# Patient Record
Sex: Male | Born: 1955 | Race: Black or African American | Hispanic: No | Marital: Married | Smoking: Current every day smoker
Health system: Southern US, Community
[De-identification: ages and names within clinical notes are randomized; demographics above are authoritative.]

## PROBLEM LIST (undated history)

## (undated) DIAGNOSIS — I10 Essential (primary) hypertension: Secondary | ICD-10-CM

## (undated) DIAGNOSIS — N529 Male erectile dysfunction, unspecified: Secondary | ICD-10-CM

## (undated) DIAGNOSIS — I255 Ischemic cardiomyopathy: Secondary | ICD-10-CM

## (undated) DIAGNOSIS — Z9842 Cataract extraction status, left eye: Secondary | ICD-10-CM

## (undated) DIAGNOSIS — Z720 Tobacco use: Secondary | ICD-10-CM

## (undated) DIAGNOSIS — M51369 Other intervertebral disc degeneration, lumbar region without mention of lumbar back pain or lower extremity pain: Secondary | ICD-10-CM

## (undated) DIAGNOSIS — N401 Enlarged prostate with lower urinary tract symptoms: Secondary | ICD-10-CM

## (undated) DIAGNOSIS — I493 Ventricular premature depolarization: Secondary | ICD-10-CM

## (undated) DIAGNOSIS — M1990 Unspecified osteoarthritis, unspecified site: Secondary | ICD-10-CM

## (undated) DIAGNOSIS — I700 Atherosclerosis of aorta: Secondary | ICD-10-CM

## (undated) DIAGNOSIS — N138 Other obstructive and reflux uropathy: Secondary | ICD-10-CM

## (undated) DIAGNOSIS — Z9841 Cataract extraction status, right eye: Secondary | ICD-10-CM

## (undated) DIAGNOSIS — K5730 Diverticulosis of large intestine without perforation or abscess without bleeding: Secondary | ICD-10-CM

## (undated) DIAGNOSIS — M5136 Other intervertebral disc degeneration, lumbar region: Secondary | ICD-10-CM

## (undated) DIAGNOSIS — K4090 Unilateral inguinal hernia, without obstruction or gangrene, not specified as recurrent: Secondary | ICD-10-CM

## (undated) DIAGNOSIS — E785 Hyperlipidemia, unspecified: Secondary | ICD-10-CM

## (undated) DIAGNOSIS — I5022 Chronic systolic (congestive) heart failure: Secondary | ICD-10-CM

## (undated) DIAGNOSIS — I509 Heart failure, unspecified: Secondary | ICD-10-CM

## (undated) DIAGNOSIS — E119 Type 2 diabetes mellitus without complications: Secondary | ICD-10-CM

## (undated) DIAGNOSIS — N400 Enlarged prostate without lower urinary tract symptoms: Secondary | ICD-10-CM

## (undated) DIAGNOSIS — I2510 Atherosclerotic heart disease of native coronary artery without angina pectoris: Secondary | ICD-10-CM

## (undated) HISTORY — DX: Unilateral inguinal hernia, without obstruction or gangrene, not specified as recurrent: K40.90

## (undated) HISTORY — DX: Chronic systolic (congestive) heart failure: I50.22

## (undated) HISTORY — DX: Ventricular premature depolarization: I49.3

## (undated) HISTORY — DX: Ischemic cardiomyopathy: I25.5

## (undated) HISTORY — DX: Hyperlipidemia, unspecified: E78.5

## (undated) HISTORY — DX: Atherosclerotic heart disease of native coronary artery without angina pectoris: I25.10

## (undated) HISTORY — PX: EYE SURGERY: SHX253

## (undated) HISTORY — PX: CATARACT EXTRACTION: SUR2

## (undated) HISTORY — DX: Diverticulosis of large intestine without perforation or abscess without bleeding: K57.30

## (undated) HISTORY — DX: Tobacco use: Z72.0

---

## 2014-07-27 DIAGNOSIS — H25013 Cortical age-related cataract, bilateral: Secondary | ICD-10-CM | POA: Insufficient documentation

## 2016-04-04 DIAGNOSIS — H2512 Age-related nuclear cataract, left eye: Secondary | ICD-10-CM | POA: Insufficient documentation

## 2016-08-21 DIAGNOSIS — I213 ST elevation (STEMI) myocardial infarction of unspecified site: Secondary | ICD-10-CM

## 2016-08-21 HISTORY — DX: ST elevation (STEMI) myocardial infarction of unspecified site: I21.3

## 2017-07-20 ENCOUNTER — Other Ambulatory Visit: Payer: Self-pay

## 2017-07-20 ENCOUNTER — Emergency Department: Payer: Commercial Managed Care - HMO

## 2017-07-20 ENCOUNTER — Encounter: Payer: Self-pay | Admitting: Emergency Medicine

## 2017-07-20 DIAGNOSIS — E1165 Type 2 diabetes mellitus with hyperglycemia: Secondary | ICD-10-CM | POA: Diagnosis present

## 2017-07-20 DIAGNOSIS — I255 Ischemic cardiomyopathy: Secondary | ICD-10-CM | POA: Diagnosis present

## 2017-07-20 DIAGNOSIS — E785 Hyperlipidemia, unspecified: Secondary | ICD-10-CM | POA: Diagnosis present

## 2017-07-20 DIAGNOSIS — I5021 Acute systolic (congestive) heart failure: Secondary | ICD-10-CM | POA: Diagnosis present

## 2017-07-20 DIAGNOSIS — J9601 Acute respiratory failure with hypoxia: Secondary | ICD-10-CM | POA: Diagnosis present

## 2017-07-20 DIAGNOSIS — Z716 Tobacco abuse counseling: Secondary | ICD-10-CM

## 2017-07-20 DIAGNOSIS — F17210 Nicotine dependence, cigarettes, uncomplicated: Secondary | ICD-10-CM | POA: Diagnosis present

## 2017-07-20 DIAGNOSIS — R079 Chest pain, unspecified: Secondary | ICD-10-CM | POA: Diagnosis not present

## 2017-07-20 DIAGNOSIS — E871 Hypo-osmolality and hyponatremia: Secondary | ICD-10-CM | POA: Diagnosis present

## 2017-07-20 DIAGNOSIS — I2119 ST elevation (STEMI) myocardial infarction involving other coronary artery of inferior wall: Secondary | ICD-10-CM | POA: Diagnosis not present

## 2017-07-20 DIAGNOSIS — Z7984 Long term (current) use of oral hypoglycemic drugs: Secondary | ICD-10-CM

## 2017-07-20 DIAGNOSIS — I2510 Atherosclerotic heart disease of native coronary artery without angina pectoris: Secondary | ICD-10-CM | POA: Diagnosis present

## 2017-07-20 LAB — CBC
HCT: 46.7 % (ref 40.0–52.0)
Hemoglobin: 15.6 g/dL (ref 13.0–18.0)
MCH: 31.2 pg (ref 26.0–34.0)
MCHC: 33.4 g/dL (ref 32.0–36.0)
MCV: 93.2 fL (ref 80.0–100.0)
PLATELETS: 252 10*3/uL (ref 150–440)
RBC: 5.02 MIL/uL (ref 4.40–5.90)
RDW: 12.9 % (ref 11.5–14.5)
WBC: 11.5 10*3/uL — AB (ref 3.8–10.6)

## 2017-07-20 NOTE — ED Triage Notes (Signed)
Pt arrives ambulatory to triage with c/o chest ache that started sometime yesterday. Pt reports that he has had a constant ache since that time. Pt states that he has some N/V yesterday but denies other cardiac symptoms today and at this time. Pt is in NAD.

## 2017-07-20 NOTE — ED Notes (Signed)
Patient transported to X-ray 

## 2017-07-21 ENCOUNTER — Encounter
Admission: EM | Disposition: A | Discharge status: Home / Self Care | Payer: Self-pay | Source: Home / Self Care | Attending: Internal Medicine

## 2017-07-21 ENCOUNTER — Other Ambulatory Visit: Payer: Self-pay

## 2017-07-21 ENCOUNTER — Inpatient Hospital Stay
Admission: EM | Admit: 2017-07-21 | Discharge: 2017-07-23 | DRG: 280 | Disposition: A | Discharge status: Home / Self Care | Payer: Commercial Managed Care - HMO | Attending: Internal Medicine | Admitting: Internal Medicine

## 2017-07-21 DIAGNOSIS — I2111 ST elevation (STEMI) myocardial infarction involving right coronary artery: Secondary | ICD-10-CM | POA: Diagnosis not present

## 2017-07-21 DIAGNOSIS — I213 ST elevation (STEMI) myocardial infarction of unspecified site: Secondary | ICD-10-CM | POA: Diagnosis not present

## 2017-07-21 DIAGNOSIS — E785 Hyperlipidemia, unspecified: Secondary | ICD-10-CM | POA: Diagnosis present

## 2017-07-21 DIAGNOSIS — I5021 Acute systolic (congestive) heart failure: Secondary | ICD-10-CM | POA: Diagnosis not present

## 2017-07-21 DIAGNOSIS — I2119 ST elevation (STEMI) myocardial infarction involving other coronary artery of inferior wall: Secondary | ICD-10-CM | POA: Diagnosis present

## 2017-07-21 DIAGNOSIS — F17210 Nicotine dependence, cigarettes, uncomplicated: Secondary | ICD-10-CM | POA: Diagnosis present

## 2017-07-21 DIAGNOSIS — Z7984 Long term (current) use of oral hypoglycemic drugs: Secondary | ICD-10-CM | POA: Diagnosis not present

## 2017-07-21 DIAGNOSIS — I5022 Chronic systolic (congestive) heart failure: Secondary | ICD-10-CM | POA: Diagnosis not present

## 2017-07-21 DIAGNOSIS — I2510 Atherosclerotic heart disease of native coronary artery without angina pectoris: Secondary | ICD-10-CM

## 2017-07-21 DIAGNOSIS — E1165 Type 2 diabetes mellitus with hyperglycemia: Secondary | ICD-10-CM | POA: Diagnosis present

## 2017-07-21 DIAGNOSIS — R079 Chest pain, unspecified: Secondary | ICD-10-CM | POA: Diagnosis not present

## 2017-07-21 DIAGNOSIS — E871 Hypo-osmolality and hyponatremia: Secondary | ICD-10-CM | POA: Diagnosis present

## 2017-07-21 DIAGNOSIS — I219 Acute myocardial infarction, unspecified: Secondary | ICD-10-CM | POA: Insufficient documentation

## 2017-07-21 DIAGNOSIS — J9601 Acute respiratory failure with hypoxia: Secondary | ICD-10-CM | POA: Diagnosis not present

## 2017-07-21 DIAGNOSIS — I340 Nonrheumatic mitral (valve) insufficiency: Secondary | ICD-10-CM | POA: Diagnosis not present

## 2017-07-21 DIAGNOSIS — E119 Type 2 diabetes mellitus without complications: Secondary | ICD-10-CM | POA: Diagnosis not present

## 2017-07-21 DIAGNOSIS — Z716 Tobacco abuse counseling: Secondary | ICD-10-CM | POA: Diagnosis not present

## 2017-07-21 DIAGNOSIS — I255 Ischemic cardiomyopathy: Secondary | ICD-10-CM | POA: Diagnosis present

## 2017-07-21 DIAGNOSIS — IMO0002 Reserved for concepts with insufficient information to code with codable children: Secondary | ICD-10-CM

## 2017-07-21 HISTORY — DX: Type 2 diabetes mellitus without complications: E11.9

## 2017-07-21 HISTORY — DX: ST elevation (STEMI) myocardial infarction involving other coronary artery of inferior wall: I21.19

## 2017-07-21 HISTORY — DX: Atherosclerotic heart disease of native coronary artery without angina pectoris: I25.10

## 2017-07-21 HISTORY — PX: LEFT HEART CATH AND CORONARY ANGIOGRAPHY: CATH118249

## 2017-07-21 HISTORY — DX: Chronic systolic (congestive) heart failure: I50.22

## 2017-07-21 LAB — GLUCOSE, CAPILLARY
GLUCOSE-CAPILLARY: 329 mg/dL — AB (ref 65–99)
GLUCOSE-CAPILLARY: 262 mg/dL — AB (ref 65–99)
Glucose-Capillary: 251 mg/dL — ABNORMAL HIGH (ref 65–99)
Glucose-Capillary: 274 mg/dL — ABNORMAL HIGH (ref 65–99)

## 2017-07-21 LAB — CBC
HCT: 44.2 % (ref 40.0–52.0)
Hemoglobin: 15.3 g/dL (ref 13.0–18.0)
MCH: 31.8 pg (ref 26.0–34.0)
MCHC: 34.7 g/dL (ref 32.0–36.0)
MCV: 91.6 fL (ref 80.0–100.0)
Platelets: 229 10*3/uL (ref 150–440)
RBC: 4.82 MIL/uL (ref 4.40–5.90)
RDW: 12.7 % (ref 11.5–14.5)
WBC: 10.4 10*3/uL (ref 3.8–10.6)

## 2017-07-21 LAB — URINE DRUG SCREEN, QUALITATIVE (ARMC ONLY)
AMPHETAMINES, UR SCREEN: NOT DETECTED
Barbiturates, Ur Screen: NOT DETECTED
Benzodiazepine, Ur Scrn: NOT DETECTED
CANNABINOID 50 NG, UR ~~LOC~~: NOT DETECTED
COCAINE METABOLITE, UR ~~LOC~~: NOT DETECTED
MDMA (ECSTASY) UR SCREEN: NOT DETECTED
METHADONE SCREEN, URINE: NOT DETECTED
Opiate, Ur Screen: NOT DETECTED
Phencyclidine (PCP) Ur S: NOT DETECTED
TRICYCLIC, UR SCREEN: NOT DETECTED

## 2017-07-21 LAB — BASIC METABOLIC PANEL
ANION GAP: 10 (ref 5–15)
Anion gap: 10 (ref 5–15)
BUN: 14 mg/dL (ref 6–20)
BUN: 14 mg/dL (ref 6–20)
CALCIUM: 8.8 mg/dL — AB (ref 8.9–10.3)
CALCIUM: 8.9 mg/dL (ref 8.9–10.3)
CHLORIDE: 100 mmol/L — AB (ref 101–111)
CO2: 23 mmol/L (ref 22–32)
CO2: 25 mmol/L (ref 22–32)
CREATININE: 0.84 mg/dL (ref 0.61–1.24)
CREATININE: 0.99 mg/dL (ref 0.61–1.24)
Chloride: 96 mmol/L — ABNORMAL LOW (ref 101–111)
GFR calc non Af Amer: >60 mL/min (ref >60)
Glucose, Bld: 285 mg/dL — ABNORMAL HIGH (ref 65–99)
Glucose, Bld: 401 mg/dL — ABNORMAL HIGH (ref 65–99)
Potassium: 3.7 mmol/L (ref 3.5–5.1)
Potassium: 3.9 mmol/L (ref 3.5–5.1)
SODIUM: 131 mmol/L — AB (ref 135–145)
Sodium: 133 mmol/L — ABNORMAL LOW (ref 135–145)

## 2017-07-21 LAB — TROPONIN I
TROPONIN I: 3.16 ng/mL — AB (ref <0.03)
TROPONIN I: 4.98 ng/mL — AB (ref <0.03)
Troponin I: 4.05 ng/mL (ref <0.03)
Troponin I: 3.94 ng/mL (ref <0.03)

## 2017-07-21 LAB — MRSA PCR SCREENING: MRSA by PCR: NEGATIVE

## 2017-07-21 LAB — PROTIME-INR
INR: 1.08
PROTHROMBIN TIME: 13.9 s (ref 11.4–15.2)

## 2017-07-21 LAB — PHOSPHORUS: Phosphorus: 2.5 mg/dL (ref 2.5–4.6)

## 2017-07-21 LAB — POCT ACTIVATED CLOTTING TIME: ACTIVATED CLOTTING TIME: 340 s

## 2017-07-21 LAB — HEMOGLOBIN A1C
Hgb A1c MFr Bld: 11.4 % — ABNORMAL HIGH (ref 4.8–5.6)
MEAN PLASMA GLUCOSE: 280.48 mg/dL

## 2017-07-21 LAB — MAGNESIUM: MAGNESIUM: 2 mg/dL (ref 1.7–2.4)

## 2017-07-21 LAB — HEPARIN LEVEL (UNFRACTIONATED)
HEPARIN UNFRACTIONATED: 0.21 [IU]/mL — AB (ref 0.30–0.70)
Heparin Unfractionated: 0.39 IU/mL (ref 0.30–0.70)

## 2017-07-21 LAB — APTT: aPTT: 129 seconds — ABNORMAL HIGH (ref 24–36)

## 2017-07-21 SURGERY — LEFT HEART CATH AND CORONARY ANGIOGRAPHY
Anesthesia: Moderate Sedation

## 2017-07-21 MED ORDER — SODIUM CHLORIDE 0.9% FLUSH
3.0000 mL | Freq: Two times a day (BID) | INTRAVENOUS | Status: DC
  Administered 2017-07-21 – 2017-07-23 (×4): 3 mL via INTRAVENOUS

## 2017-07-21 MED ORDER — MORPHINE SULFATE (PF) 2 MG/ML IV SOLN
2.0000 mg | INTRAVENOUS | Status: DC | PRN
  Administered 2017-07-21 – 2017-07-23 (×4): 2 mg via INTRAVENOUS
  Filled 2017-07-21 (×4): qty 1

## 2017-07-21 MED ORDER — HEPARIN (PORCINE) IN NACL 2-0.9 UNIT/ML-% IJ SOLN
INTRAMUSCULAR | Status: AC
  Filled 2017-07-21: qty 500

## 2017-07-21 MED ORDER — SODIUM CHLORIDE 0.9% FLUSH: 3.0000 mL | INTRAVENOUS | Status: DC | PRN

## 2017-07-21 MED ORDER — ONDANSETRON HCL 4 MG/2ML IJ SOLN: 4.0000 mg | Freq: Four times a day (QID) | INTRAMUSCULAR | Status: DC | PRN

## 2017-07-21 MED ORDER — INSULIN ASPART 100 UNIT/ML ~~LOC~~ SOLN
0.0000 [IU] | Freq: Three times a day (TID) | SUBCUTANEOUS | Status: DC
  Administered 2017-07-21: 11 [IU] via SUBCUTANEOUS
  Administered 2017-07-22 (×2): 4 [IU] via SUBCUTANEOUS
  Administered 2017-07-22: 11 [IU] via SUBCUTANEOUS
  Administered 2017-07-23 (×2): 4 [IU] via SUBCUTANEOUS
  Filled 2017-07-21 (×6): qty 1

## 2017-07-21 MED ORDER — ASPIRIN 81 MG PO CHEW
81.0000 mg | CHEWABLE_TABLET | Freq: Every day | ORAL | Status: DC
  Administered 2017-07-21 – 2017-07-23 (×3): 81 mg via ORAL
  Filled 2017-07-21 (×3): qty 1

## 2017-07-21 MED ORDER — SODIUM CHLORIDE 0.9 % IV SOLN: 250.0000 mL | INTRAVENOUS | Status: DC | PRN

## 2017-07-21 MED ORDER — HEPARIN BOLUS VIA INFUSION
1400.0000 [IU] | Freq: Once | INTRAVENOUS | Status: AC
  Administered 2017-07-21: 1400 [IU] via INTRAVENOUS
  Filled 2017-07-21: qty 1400

## 2017-07-21 MED ORDER — ENOXAPARIN SODIUM 40 MG/0.4ML ~~LOC~~ SOLN: 40.0000 mg | SUBCUTANEOUS | Status: DC

## 2017-07-21 MED ORDER — VERAPAMIL HCL 2.5 MG/ML IV SOLN
INTRAVENOUS | Status: AC
Start: 2017-07-21 — End: ?
  Filled 2017-07-21: qty 2

## 2017-07-21 MED ORDER — ATORVASTATIN CALCIUM 20 MG PO TABS
80.0000 mg | ORAL_TABLET | Freq: Every day | ORAL | Status: DC
  Administered 2017-07-21 – 2017-07-23 (×3): 80 mg via ORAL
  Filled 2017-07-21 (×3): qty 4

## 2017-07-21 MED ORDER — ASPIRIN 81 MG PO CHEW
324.0000 mg | CHEWABLE_TABLET | Freq: Once | ORAL | Status: AC
  Administered 2017-07-21: 324 mg via ORAL

## 2017-07-21 MED ORDER — METOPROLOL TARTRATE 5 MG/5ML IV SOLN
3.0000 mg | Freq: Once | INTRAVENOUS | Status: AC
  Administered 2017-07-21: 3 mg via INTRAVENOUS
  Filled 2017-07-21: qty 5

## 2017-07-21 MED ORDER — HEPARIN SODIUM (PORCINE) 1000 UNIT/ML IJ SOLN
INTRAMUSCULAR | Status: AC
  Filled 2017-07-21: qty 1

## 2017-07-21 MED ORDER — HEPARIN (PORCINE) IN NACL 100-0.45 UNIT/ML-% IJ SOLN
1350.0000 [IU]/h | INTRAMUSCULAR | Status: DC
  Administered 2017-07-21: 1350 [IU]/h via INTRAVENOUS
  Filled 2017-07-21: qty 250

## 2017-07-21 MED ORDER — INSULIN ASPART 100 UNIT/ML ~~LOC~~ SOLN: 0.0000 [IU] | Freq: Every day | SUBCUTANEOUS | Status: DC

## 2017-07-21 MED ORDER — NITROGLYCERIN 0.4 MG SL SUBL
0.4000 mg | SUBLINGUAL_TABLET | SUBLINGUAL | Status: DC | PRN
  Administered 2017-07-21 (×3): 0.4 mg via SUBLINGUAL
  Filled 2017-07-21 (×3): qty 1

## 2017-07-21 MED ORDER — NITROGLYCERIN IN D5W 200-5 MCG/ML-% IV SOLN
10.0000 ug/min | INTRAVENOUS | Status: DC
  Administered 2017-07-21: 10 ug/min via INTRAVENOUS
  Administered 2017-07-21: 5 ug/min via INTRAVENOUS
  Filled 2017-07-21: qty 250

## 2017-07-21 MED ORDER — INSULIN ASPART 100 UNIT/ML ~~LOC~~ SOLN
0.0000 [IU] | Freq: Three times a day (TID) | SUBCUTANEOUS | Status: DC
  Administered 2017-07-21: 5 [IU] via SUBCUTANEOUS
  Administered 2017-07-21: 11 [IU] via SUBCUTANEOUS
  Filled 2017-07-21 (×2): qty 1

## 2017-07-21 MED ORDER — CARVEDILOL 3.125 MG PO TABS
3.1250 mg | ORAL_TABLET | Freq: Two times a day (BID) | ORAL | Status: DC
  Administered 2017-07-21 – 2017-07-23 (×4): 3.125 mg via ORAL
  Filled 2017-07-21 (×6): qty 1

## 2017-07-21 MED ORDER — ACETAMINOPHEN 325 MG PO TABS: 650.0000 mg | ORAL_TABLET | ORAL | Status: DC | PRN

## 2017-07-21 MED ORDER — LIDOCAINE HCL (PF) 1 % IJ SOLN
INTRAMUSCULAR | Status: DC | PRN
  Administered 2017-07-21: 4 mL via SUBCUTANEOUS

## 2017-07-21 MED ORDER — HEPARIN SODIUM (PORCINE) 5000 UNIT/ML IJ SOLN
4000.0000 [IU] | Freq: Once | INTRAMUSCULAR | Status: AC
  Administered 2017-07-21: 4000 [IU] via INTRAVENOUS

## 2017-07-21 MED ORDER — LOSARTAN POTASSIUM 50 MG PO TABS
25.0000 mg | ORAL_TABLET | Freq: Every day | ORAL | Status: DC
  Administered 2017-07-21: 25 mg via ORAL
  Filled 2017-07-21: qty 1

## 2017-07-21 MED ORDER — POLYETHYLENE GLYCOL 3350 17 G PO PACK
17.0000 g | PACK | Freq: Every day | ORAL | Status: DC
  Administered 2017-07-21 – 2017-07-23 (×3): 17 g via ORAL
  Filled 2017-07-21 (×3): qty 1

## 2017-07-21 MED ORDER — HEPARIN (PORCINE) IN NACL 100-0.45 UNIT/ML-% IJ SOLN
1550.0000 [IU]/h | INTRAMUSCULAR | Status: DC
  Administered 2017-07-21: 1350 [IU]/h via INTRAVENOUS
  Administered 2017-07-21 – 2017-07-23 (×3): 1550 [IU]/h via INTRAVENOUS
  Filled 2017-07-21 (×4): qty 250

## 2017-07-21 MED ORDER — IOPAMIDOL (ISOVUE-300) INJECTION 61%
INTRAVENOUS | Status: DC | PRN
  Administered 2017-07-21: 140 mL

## 2017-07-21 MED ORDER — HEPARIN SODIUM (PORCINE) 1000 UNIT/ML IJ SOLN
INTRAMUSCULAR | Status: DC | PRN
  Administered 2017-07-21: 6000 [IU] via INTRAVENOUS

## 2017-07-21 MED ORDER — CLOPIDOGREL BISULFATE 75 MG PO TABS
75.0000 mg | ORAL_TABLET | Freq: Every day | ORAL | Status: DC
  Administered 2017-07-21 – 2017-07-23 (×3): 75 mg via ORAL
  Filled 2017-07-21 (×3): qty 1

## 2017-07-21 MED ORDER — SODIUM CHLORIDE 0.9 % IV SOLN
INTRAVENOUS | Status: AC
  Administered 2017-07-21: 03:00:00 via INTRAVENOUS

## 2017-07-21 MED ORDER — LIDOCAINE HCL (PF) 1 % IJ SOLN
INTRAMUSCULAR | Status: AC
  Filled 2017-07-21: qty 30

## 2017-07-21 MED ORDER — VERAPAMIL HCL 2.5 MG/ML IV SOLN
INTRAVENOUS | Status: DC | PRN
  Administered 2017-07-21: 3 mg via INTRA_ARTERIAL

## 2017-07-21 MED ORDER — HEPARIN BOLUS VIA INFUSION
4000.0000 [IU] | Freq: Once | INTRAVENOUS | Status: DC
  Filled 2017-07-21: qty 4000

## 2017-07-21 MED ORDER — HEPARIN (PORCINE) IN NACL 2-0.9 UNIT/ML-% IJ SOLN
INTRAMUSCULAR | Status: DC | PRN
  Administered 2017-07-21: 02:00:00

## 2017-07-21 MED ORDER — INSULIN ASPART 100 UNIT/ML ~~LOC~~ SOLN
0.0000 [IU] | Freq: Every day | SUBCUTANEOUS | Status: DC
  Administered 2017-07-21: 3 [IU] via SUBCUTANEOUS
  Filled 2017-07-21 (×2): qty 1

## 2017-07-21 SURGICAL SUPPLY — 12 items
CATH INFINITI 5FR ANG PIGTAIL (CATHETERS) ×2 IMPLANT
CATH LAUNCHER 6FR JR4 (CATHETERS) ×2 IMPLANT
CATH OPTITORQUE JACKY 4.0 5F (CATHETERS) ×2 IMPLANT
DEVICE INFLAT 30 PLUS (MISCELLANEOUS) ×2 IMPLANT
DEVICE RAD COMP TR BAND LRG (VASCULAR PRODUCTS) ×2 IMPLANT
DEVICE RAD TR BAND REGULAR (VASCULAR PRODUCTS) ×2 IMPLANT
GLIDESHEATH SLEND SS 6F .021 (SHEATH) ×2 IMPLANT
KIT MANI 3VAL PERCEP (MISCELLANEOUS) ×2 IMPLANT
PACK CARDIAC CATH (CUSTOM PROCEDURE TRAY) ×2 IMPLANT
WIRE INTUITION PROPEL ST 180CM (WIRE) ×2 IMPLANT
WIRE ROSEN-J .035X260CM (WIRE) ×2 IMPLANT
WIRE RUNTHROUGH .014X180CM (WIRE) ×2 IMPLANT

## 2017-07-21 NOTE — ED Notes (Signed)
Date and time results received: 07/21/17 0010 (use smartphrase ".now" to insert current time)  Test: troponin Critical Value: 4.98  Name of Provider Notified: Dr. Owens Shark  Orders Received? Or Actions Taken?: Acknowledged

## 2017-07-21 NOTE — Consult Note (Signed)
PULMONARY / CRITICAL CARE MEDICINE   Name: Steele D [place] MRN: [id number] DOB: 08-07-1956    ADMISSION DATE:  07/21/2017   CONSULTATION DATE:  07/21/2017  REFERRING MD:  Dr Fletcher Anon  Reason: ICU management acute ST elevation MI status post left heart catheterization   HISTORY OF PRESENT ILLNESS:   61 year old male with a history of uncontrolled type 2 diabetes and current tobacco use presenting with chest pain that started the day before.  Upon arrival in the ED patient complained of nausea and vomiting.  His EKG showed inferior Q waves with if equivocal ST elevation inferiorly and a troponin level of 5.  He was taken emergently to to the Cath Lab for left heart catheterization.  He was found to have severe stenosis in the proximal RCA and distal circumflex not amenable to any interventions.  Medical management was recommended and patient was transferred to the ICU for further observation and management. He is still complaining of chest pain, left substernal, steady, pressure-like in nature, 5/10 in intensity; minimal relief with nitroglycerin; moderate relief with morphine.  Denies nausea vomiting dizziness and headache PAST MEDICAL HISTORY :  He  has a past medical history of Diabetes mellitus without complication ([place]).  PAST SURGICAL HISTORY: He  has a past surgical history that includes Eye surgery.  No Known Allergies  No current facility-administered medications on file prior to encounter.    No current outpatient medications on file prior to encounter.    FAMILY HISTORY:  His has no family status information on file.    SOCIAL HISTORY: He  reports that he has been smoking.  He has been smoking about 1.00 pack per day. he has never used smokeless tobacco. He reports that he drinks alcohol. He reports that he does not use drugs.  REVIEW OF SYSTEMS:   All systems reviewed.  Pertinent positives include chest pain, pain with deep breaths and mild shortness of breath.  All other  systems are negative  SUBJECTIVE:   VITAL SIGNS: BP 105/68   Pulse 90   Temp 97.9 F (36.6 C) (Oral)   Resp (!) 28   Ht 6' (1.829 m)   Wt 99.8 kg (220 lb)   SpO2 91%   BMI 29.84 kg/m   HEMODYNAMICS:    VENTILATOR SETTINGS:    INTAKE / OUTPUT: No intake/output data recorded.  PHYSICAL EXAMINATION: General: Mild distress Neuro:  AAO X3, CN intact HEENT:  PERRLA, trachea midline, neck is supple, no JVD Cardiovascular: RRR, S1/S2, no MRG, +2 PULSES, no edema Lungs:  CTAB Abdomen:  Soft, NT/ND, + BS X4 Musculoskeletal:  No deformities Skin:  Warm and dry  LABS:  BMET Recent Labs  Lab 07/20/17 2341 07/21/17 0339  NA 131* 133*  K 3.9 3.7  CL 96* 100*  CO2 25 23  BUN 14 14  CREATININE 0.99 0.84  GLUCOSE 401* 285*    Electrolytes Recent Labs  Lab 07/20/17 2341 07/21/17 0339  CALCIUM 8.8* 8.9  MG  --  2.0  PHOS  --  2.5    CBC Recent Labs  Lab 07/20/17 2341 07/21/17 0339  WBC 11.5* 10.4  HGB 15.6 15.3  HCT 46.7 44.2  PLT 252 229    Coag's Recent Labs  Lab 07/21/17 0033  APTT 129*  INR 1.08    Sepsis Markers No results for input(s): LATICACIDVEN, PROCALCITON, O2SATVEN in the last 168 hours.  ABG No results for input(s): PHART, PCO2ART, PO2ART in the last 168 hours.  Liver Enzymes No  results for input(s): AST, ALT, ALKPHOS, BILITOT, ALBUMIN in the last 168 hours.  Cardiac Enzymes Recent Labs  Lab 07/20/17 2341 07/21/17 0339  TROPONINI 4.98* 4.05*    Glucose No results for input(s): GLUCAP in the last 168 hours.  Imaging Dg Chest 2 View  Result Date: 07/21/2017 CLINICAL DATA:  Chest pain and nausea and vomiting beginning yesterday. EXAM: CHEST  2 VIEW COMPARISON:  None. FINDINGS: The heart size and mediastinal contours are within normal limits. Both lungs are clear. The visualized skeletal structures are unremarkable. IMPRESSION: Negative.  No active cardiopulmonary disease. Electronically Signed   By: Earle Gell M.D.   On:  07/21/2017 00:04     STUDIES:  LEFT HEART CATH AND CORONARY ANGIOGRAPHY  Conclusion     Prox Cx lesion is 40% stenosed.  Mid Cx lesion is 40% stenosed.  Dist Cx lesion is 100% stenosed.  Prox RCA lesion is 100% stenosed.  There is severe left ventricular systolic dysfunction.  LV end diastolic pressure is mildly elevated.  The left ventricular ejection fraction is 25-35% by visual estimate.  There is trivial (1+) mitral regurgitation.   1.  Severe two-vessel coronary artery disease with total occlusion of proximal right coronary artery with well-developed left-to-right collaterals and occluded distal left circumflex also with collaterals.  No significant obstructive disease affecting the LAD. 2.  Severely reduced LV systolic function with an EF of 25-30% with inferior wall akinesis as well as hypokinesis of the basal anterior wall.  Mildly elevated left ventricular end-diastolic pressure with an LVEDP of 14 mmHg.   3.  Attempted unsuccessful angioplasty of the right coronary artery due to inability to cross with a wire.  Recommendations: This is a late presenting inferior ST elevation myocardial infarction of at least 24 hours duration.  The EKG was with large inferior Q waves.  The left circumflex disease appears to be chronic.  The RCA occlusion appeared to be acute on chronic and thus I decided to probe with a wire to determine the ability to cross.  I was not able to cross with 2 wires.  There are already reasonable collaterals from the left coronary system.  Recommend medical therapy for coronary artery disease. The patient does have cardiomyopathy which seems to be mixed ischemic and nonischemic and out of proportion to his coronary artery disease. I initiated treatment with carvedilol and losartan.  Consider spironolactone before hospital discharge. The patient needs treatment for diabetes as he has not been on medications.  Delays in door to cath time due to patient's  late presentation and equivocal EKG changes on presentation.     SIGNIFICANT EVENTS: 07/20/17: Admitted  LINES/TUBES: Peripheral IVs  DISCUSSION: 61 year old male with a history of type 2 diabetes presenting with an acute on chronic inferior wall ST elevation MI.  She is now status post left heart catheterization with no stent placement.  Medical management recommended  ASSESSMENT  Late presenting inferior wall ST elevation MI-Severe two-vessel coronary artery disease with total occlusion of proximal right coronary artery with well-developed left-to-right collaterals and occluded distal left circumflex also with collaterals  Severe LV dysfunction-EF 25-30%  Mixed ischemic and non-ischemic cardiomyopathy  Uncontrolled T2DM  Tobacco use disorder  PLAN Hemodynamic monitoring per ICU protocol Continue carvedilol, aspirin, Lipitor, Plavix, heparin, as needed nitroglycerin, as needed morphine and losartan per cardiology Blood glucose monitoring with sliding scale insulin coverage Hemoglobin A1c with a.m. labs Check urine toxicology Smoking cessation advice given  FAMILY  - Updates:  Rutledge Selsor S. [facility] ANP-BC Pulmonary and Critical Care Medicine [facility] Pager [phone] or [phone]  07/21/2017, 5:40 AM

## 2017-07-21 NOTE — Progress Notes (Signed)
[place] for heparin drip Indication: ACS/STEMI  No Known Allergies  Patient Measurements: Height: 6' (182.9 cm) Weight: 220 lb (99.8 kg) IBW/kg (Calculated) : 77.6 Heparin Dosing Weight: 98 kg   Vital Signs: Temp: 99.3 F (37.4 C) (12/01 0800) Temp Source: Oral (12/01 0800) BP: 99/63 (12/01 1200) Pulse Rate: 98 (12/01 1200)  Labs: Recent Labs    07/20/17 2341 07/21/17 0033 07/21/17 0339 07/21/17 1132  HGB 15.6  --  15.3  --   HCT 46.7  --  44.2  --   PLT 252  --  229  --   APTT  --  129*  --   --   LABPROT  --  13.9  --   --   INR  --  1.08  --   --   HEPARINUNFRC  --   --   --  0.21*  CREATININE 0.99  --  0.84  --   TROPONINI 4.98*  --  4.05* 3.16*    Estimated Creatinine Clearance: 111.6 mL/min (by C-G formula based on SCr of 0.84 mg/dL).   Medical History: Past Medical History:  Diagnosis Date  . Diabetes mellitus without complication (HCC)     Medications:  No anticoag in PTA meds.  Assessment: 61 yo male with late presenting inferior MI.   Goal of Therapy:  Heparin level 0.3-0.7 units/ml Monitor platelets by anticoagulation protocol: Yes   Plan:  4000 unit bolus and initial rate of 1350 units/hr. First heparin level 6 hours after start of infusion.   12/01 0330 heparin was d/c by cardiology but restarted by CCM. Will restart at previous rate with no bolus. First level 6 hours after restart.  12/1 1132 HL 0.21 subtherapeutic. RN confirms heparin drip running at 13.5 ml/hr; no line issues or s/sx of bleeding noted by RN. Will order heparin bolus 1400 units IV x1 then increase drip to 1550 units/hr (=15.65ml/hr). Recheck HL in 6h. CBC in AM. Hgb and plt count about stable.     Rayna Sexton L 07/21/2017,12:40 PM

## 2017-07-21 NOTE — Progress Notes (Signed)
NP in to speak with patient.

## 2017-07-21 NOTE — H&P (Signed)
[facility] [place] NAME: Lawrence Holmes    MR#:  [id number]  DATE OF BIRTH:  11/07/1955  DATE OF ADMISSION:  07/21/2017  PRIMARY CARE PHYSICIAN: Patient, No Pcp Per   REQUESTING/REFERRING PHYSICIAN: Fletcher Anon, MD  CHIEF COMPLAINT:   Chief Complaint  Patient presents with  . Chest Pain    HISTORY OF PRESENT ILLNESS:  Lawrence Holmes  is a 61 y.o. male who presents with STEMI.  Patient came in after having shoulder pain for a couple of days, which then became more chest pain on the day he arrived to the ED.  He was found to have a STEMI equivalent with inferior Q waves and was taken for catheterization.  He was found to have occlusion of the right coronary which was not amenable to angioplasty or stenting.  He has significant heart failure with an EF of around 20%.  Patient states that he has had several episodes of the shoulder pain before, and it is very likely that he had some prior unrecognized insults leading to his severe heart failure.  He was admitted with goal of maximum medical management and hospitalist were called for admission.  PAST MEDICAL HISTORY:   Past Medical History:  Diagnosis Date  . Diabetes mellitus without complication ([place])     PAST SURGICAL HISTORY:   Past Surgical History:  Procedure Laterality Date  . EYE SURGERY      SOCIAL HISTORY:   Social History   Tobacco Use  . Smoking status: Current Every Day Smoker    Packs/day: 1.00  . Smokeless tobacco: Never Used  Substance Use Topics  . Alcohol use: Yes    FAMILY HISTORY:  No family history on file.  DRUG ALLERGIES:  No Known Allergies  MEDICATIONS AT HOME:   Prior to Admission medications   Not on File    REVIEW OF SYSTEMS:  Review of Systems  Constitutional: Negative for chills, fever, malaise/fatigue and weight loss.  HENT: Negative for ear pain, hearing loss and tinnitus.   Eyes: Negative for blurred vision, double vision, pain and  redness.  Respiratory: Negative for cough, hemoptysis and shortness of breath.   Cardiovascular: Positive for chest pain. Negative for palpitations, orthopnea and leg swelling.  Gastrointestinal: Negative for abdominal pain, constipation, diarrhea, nausea and vomiting.  Genitourinary: Negative for dysuria, frequency and hematuria.  Musculoskeletal: Negative for back pain, joint pain and neck pain.  Skin:       No acne, rash, or lesions  Neurological: Negative for dizziness, tremors, focal weakness and weakness.  Endo/Heme/Allergies: Negative for polydipsia. Does not bruise/bleed easily.  Psychiatric/Behavioral: Negative for depression. The patient is not nervous/anxious and does not have insomnia.      VITAL SIGNS:   Vitals:   07/21/17 1700 07/21/17 1800 07/21/17 1913 07/21/17 1930  BP: 115/69 95/61 92/64  97/64  Pulse: (!) 101 95 86 85  Resp: (!) 22 20 18 17   Temp:   99 F (37.2 C)   TempSrc:   Oral   SpO2: 92% 92% 95% 95%  Weight:      Height:       Wt Readings from Last 3 Encounters:  07/20/17 99.8 kg (220 lb)    PHYSICAL EXAMINATION:  Physical Exam  Vitals reviewed. Constitutional: He is oriented to person, place, and time. He appears well-developed and well-nourished. No distress.  HENT:  Head: Normocephalic and atraumatic.  Mouth/Throat: Oropharynx is clear and moist.  Eyes: Conjunctivae and EOM are normal.  Pupils are equal, round, and reactive to light. No scleral icterus.  Neck: Normal range of motion. Neck supple. No JVD present. No thyromegaly present.  Cardiovascular: Normal rate, regular rhythm and intact distal pulses. Exam reveals no gallop and no friction rub.  No murmur heard. Respiratory: Effort normal and breath sounds normal. No respiratory distress. He has no wheezes. He has no rales.  GI: Soft. Bowel sounds are normal. He exhibits no distension. There is no tenderness.  Musculoskeletal: Normal range of motion. He exhibits no edema.  No arthritis, no  gout  Lymphadenopathy:    He has no cervical adenopathy.  Neurological: He is alert and oriented to person, place, and time. No cranial nerve deficit.  No dysarthria, no aphasia  Skin: Skin is warm and dry. No rash noted. No erythema.  Psychiatric: He has a normal mood and affect. His behavior is normal. Judgment and thought content normal.    LABORATORY PANEL:   CBC Recent Labs  Lab 07/21/17 0339  WBC 10.4  HGB 15.3  HCT 44.2  PLT 229   ------------------------------------------------------------------------------------------------------------------  Chemistries  Recent Labs  Lab 07/21/17 0339  NA 133*  K 3.7  CL 100*  CO2 23  GLUCOSE 285*  BUN 14  CREATININE 0.84  CALCIUM 8.9  MG 2.0   ------------------------------------------------------------------------------------------------------------------  Cardiac Enzymes Recent Labs  Lab 07/21/17 1538  TROPONINI 3.94*   ------------------------------------------------------------------------------------------------------------------  RADIOLOGY:  Dg Chest 2 View  Result Date: 07/21/2017 CLINICAL DATA:  Chest pain and nausea and vomiting beginning yesterday. EXAM: CHEST  2 VIEW COMPARISON:  None. FINDINGS: The heart size and mediastinal contours are within normal limits. Both lungs are clear. The visualized skeletal structures are unremarkable. IMPRESSION: Negative.  No active cardiopulmonary disease. Electronically Signed   By: Earle Gell M.D.   On: 07/21/2017 00:04    EKG:   Orders placed or performed during the hospital encounter of 07/21/17  . EKG 12-Lead  . EKG 12-Lead  . ED EKG within 10 minutes  . ED EKG within 10 minutes  . EKG 12-Lead  . EKG 12-Lead  . ED EKG  . ED EKG  . ED EKG  . ED EKG  . EKG 12-Lead  . EKG 12-Lead  . EKG 12-Lead  . EKG 12-Lead    IMPRESSION AND PLAN:  Principal Problem:   Acute ST elevation myocardial infarction (STEMI) of inferior wall (HCC) -taken for catheterization,  RCA lesion which was not amenable to stenting.  Maximum medical management.  Cardiology following. Active Problems:   Acute systolic CHF (congestive heart failure) ([place]) -likely due to his most recent STEMI, although he is probably had other unrecognized previous insults.  Maximum medical management as above.  Cardiology following.   Diabetes ([place]) -sliding scale insulin with corresponding glucose checks.  All the records are reviewed and case discussed with ED provider. Management plans discussed with the patient and/or family.  DVT PROPHYLAXIS: Systemic anticoagulation  GI PROPHYLAXIS: None  ADMISSION STATUS: Inpatient  CODE STATUS: Full    Code Status Orders  (From admission, onward)        Start     Ordered   07/21/17 0218  Full code  Continuous     07/21/17 0217    Code Status History    Date Active Date Inactive Code Status Order ID Comments User Context   This patient has a current code status but no historical code status.      TOTAL TIME TAKING CARE OF THIS PATIENT: 45 minutes.  Milbern Doescher [place] 07/21/2017, 8:58 PM  [facility]  Office  [phone]  CC: Primary care physician; Patient, No Pcp Per  Note:  This document was prepared using Dragon voice recognition software and may include unintentional dictation errors.

## 2017-07-21 NOTE — Progress Notes (Signed)
Lawrence Holmes responded to a PG for Code Stroke in ED-05. EDP stated that the Pt will go to the Cath Lab for a procedure. Pt is in good spirits and understands his status. Pt has no family bedside and did not desire for family to be notified at this time. [place] informed Pt that he is available for follow up should he want or need spiritual care. Pt is appreciative.    07/21/17 0000  Clinical Encounter Type  Visited With Patient;Health care provider  Visit Type Initial;Spiritual support;Code;ED (Code Stroke)  Referral From Nurse  Consult/Referral To Chaplain

## 2017-07-21 NOTE — Progress Notes (Signed)
RN notified Cardiology, Dr Aundra Dubin that patient is still complaining of chest pain. Patient states that pain is tolerable at 4 after IV morphine.   MD ordered for nitro gtt to start at 10, can titrate up to 50 as tolerated, keep systolic BP 996 or better.  Dr Humphrey Rolls aware.

## 2017-07-21 NOTE — Consult Note (Signed)
Cardiology Consultation:   Patient ID: Lawrence Holmes; [id number]; October 23, 1955   Admit date: 07/21/2017 Date of Consult: 07/21/2017  Primary Care Provider: No primary care provider on file. Primary Cardiologist: new Fletcher Anon) Primary Electrophysiologist:  n/a   Patient Profile:   Lawrence Holmes is a 61 y.o. male with a hx of diabetes mellitus not on medications and tobacco use who is being seen today for the evaluation of chest pain and abnormal EKG at the request of Dr. Owens Shark.  History of Present Illness:   [name] is a 60 year old African-American male with known history of type 2 diabetes who has not been on medications for at least one year and has not seen a primary care physician in few years.  He is also a smoker.  He had prior cataract surgery.  He presented with chest pain of at least 24 hours duration that has been associated with shortness of breath.  The chest pain is described as substernal tightness and heaviness.  No syncope, abdominal pain or vomiting. The patient's initial EKG showed inferior Q waves with equivocal ST elevation inferiorly.  This appeared more prominent on subsequent EKG and thus a code STEMI was activated.  His troponin was already elevated at 5.  Past Medical History:  Diagnosis Date  . Diabetes mellitus without complication [facility])     Past Surgical History:  Procedure Laterality Date  . EYE SURGERY       Home Medications:  Prior to Admission medications   Not on File    Inpatient Medications: Scheduled Meds: . aspirin  81 mg Oral Daily  . atorvastatin  80 mg Oral q1800  . carvedilol  3.125 mg Oral BID WC  . clopidogrel  75 mg Oral Q breakfast  . [START ON 07/22/2017] enoxaparin (LOVENOX) injection  40 mg Subcutaneous Q24H  . losartan  25 mg Oral Daily  . sodium chloride flush  3 mL Intravenous Q12H   Continuous Infusions: . sodium chloride    . sodium chloride     PRN Meds: sodium chloride, acetaminophen, ondansetron (ZOFRAN) IV, sodium  chloride flush  Allergies:   No Known Allergies  Social History:   Social History   Socioeconomic History  . Marital status: Married    Spouse name: Not on file  . Number of children: Not on file  . Years of education: Not on file  . Highest education level: Not on file  Social Needs  . Financial resource strain: Not on file  . Food insecurity - worry: Not on file  . Food insecurity - inability: Not on file  . Transportation needs - medical: Not on file  . Transportation needs - non-medical: Not on file  Occupational History  . Not on file  Tobacco Use  . Smoking status: Current Every Day Smoker    Packs/day: 1.00  . Smokeless tobacco: Never Used  Substance and Sexual Activity  . Alcohol use: Yes  . Drug use: No  . Sexual activity: Not on file  Other Topics Concern  . Not on file  Social History Narrative  . Not on file    Family History:   Patient's family history is negative for coronary artery disease.  ROS:  Please see the history of present illness.  ROS  All other ROS reviewed and negative.     Physical Exam/Data:   Vitals:   07/20/17 2337 07/20/17 2338 07/21/17 0027 07/21/17 0047  BP: 137/80  (!) 153/92 (!) 119/93  Pulse: 89   82  Resp: 18  15 18   Temp: 97.9 F (36.6 C)     TempSrc: Oral     SpO2: 97%   97%  Weight:  220 lb (99.8 kg)    Height:  6' (1.829 m)     No intake or output data in the 24 hours ending 07/21/17 0235 Filed Weights   07/20/17 2338  Weight: 220 lb (99.8 kg)   Body mass index is 29.84 kg/m.  General:  Well nourished, well developed, in no acute distress HEENT: normal Lymph: no adenopathy Neck: no JVD Endocrine:  No thryomegaly Vascular: No carotid bruits; FA pulses 2+ bilaterally without bruits  Cardiac:  normal S1, S2; RRR; no murmur  Lungs:  clear to auscultation bilaterally, no wheezing, rhonchi or rales  Abd: soft, nontender, no hepatomegaly  Ext: no edema Musculoskeletal:  No deformities, BUE and BLE strength  normal and equal Skin: warm and dry  Neuro:  CNs 2-12 intact, no focal abnormalities noted Psych:  Normal affect   EKG:  The EKG was personally reviewed and demonstrates: Normal sinus rhythm with large inferior Q waves with barely 1 mm of inferior ST elevation.  Relevant CV Studies:   Laboratory Data:  Chemistry Recent Labs  Lab 07/20/17 2341  NA 131*  K 3.9  CL 96*  CO2 25  GLUCOSE 401*  BUN 14  CREATININE 0.99  CALCIUM 8.8*  GFRNONAA >60  GFRAA >60  ANIONGAP 10    No results for input(s): PROT, ALBUMIN, AST, ALT, ALKPHOS, BILITOT in the last 168 hours. Hematology Recent Labs  Lab 07/20/17 2341  WBC 11.5*  RBC 5.02  HGB 15.6  HCT 46.7  MCV 93.2  MCH 31.2  MCHC 33.4  RDW 12.9  PLT 252   Cardiac Enzymes Recent Labs  Lab 07/20/17 2341  TROPONINI 4.98*   No results for input(s): TROPIPOC in the last 168 hours.  BNPNo results for input(s): BNP, PROBNP in the last 168 hours.  DDimer No results for input(s): DDIMER in the last 168 hours.  Radiology/Studies:  Dg Chest 2 View  Result Date: 07/21/2017 CLINICAL DATA:  Chest pain and nausea and vomiting beginning yesterday. EXAM: CHEST  2 VIEW COMPARISON:  None. FINDINGS: The heart size and mediastinal contours are within normal limits. Both lungs are clear. The visualized skeletal structures are unremarkable. IMPRESSION: Negative.  No active cardiopulmonary disease. Electronically Signed   By: Earle Gell M.D.   On: 07/21/2017 00:04    Assessment and Plan:   1. Late presenting inferior ST elevation myocardial infarction: This is at least of 24 hours duration and the presenting EKG showed large inferior Q waves.  Given that the patient was still having mild chest discomfort, we elected to proceed with emergent cardiac catheterization.  The patient was given aspirin and heparin.  Cardiac catheterization showed severe two-vessel coronary artery disease with total occlusion of the proximal right coronary artery with  left-to-right collaterals and also occluded distal left circumflex with collaterals.  No significant obstructive disease affecting the LAD.  Ejection fraction was 25-30% with inferior wall akinesis and hypokinesis of the basal anterior wall.  Left ventricular end-diastolic pressure was mildly elevated at 14.  I attempted angioplasty of the right coronary artery but was not able to cross the occlusion with 2 wires which indicates chronicity.  I suspect that the patient has acute on chronic disease in that area.  Due to all of that, I recommend medical therapy for coronary artery disease.  The patient was started  on clopidogrel.  He needs aggressive treatment of risk factors. 2.  Acute systolic heart failure: EF was 25-35%.  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  I started treatment with carvedilol and losartan.  Consider adding spironolactone before hospital discharge.  3.  Hyperlipidemia: I started high-dose atorvastatin.  4.  Diabetes mellitus: Management per internal medicine.  5.  Tobacco use: The patient should be counseled regarding smoking cessation.   For questions or updates, please contact [place] Please consult [email or web address] for contact info under Cardiology/STEMI.   Signed, Kathlyn Sacramento, MD  07/21/2017 2:35 AM

## 2017-07-21 NOTE — Progress Notes (Addendum)
ANTICOAGULATION CONSULT NOTE - Initial Consult  Pharmacy Consult for heparin drip Indication: ACS/STEMI  No Known Allergies  Patient Measurements: Height: 6' (182.9 cm) Weight: 220 lb (99.8 kg) IBW/kg (Calculated) : 77.6 Heparin Dosing Weight: 98 kg   Vital Signs: Temp: 97.9 F (36.6 C) (11/30 2337) Temp Source: Oral (11/30 2337) BP: 119/93 (12/01 0047) Pulse Rate: 82 (12/01 0047)  Labs: Recent Labs    07/20/17 2341 07/21/17 0033  HGB 15.6  --   HCT 46.7  --   PLT 252  --   APTT  --  129*  LABPROT  --  13.9  INR  --  1.08  CREATININE 0.99  --   TROPONINI 4.98*  --     Estimated Creatinine Clearance: 94.7 mL/min (by C-G formula based on SCr of 0.99 mg/dL).   Medical History: Past Medical History:  Diagnosis Date  . Diabetes mellitus without complication (HCC)     Medications:  No anticoag in PTA meds.  Assessment:  Goal of Therapy:  Heparin level 0.3-0.7 units/ml Monitor platelets by anticoagulation protocol: Yes   Plan:  4000 unit bolus and initial rate of 1350 units/hr. First heparin level 6 hours after start of infusion.   12/01 0330 heparin was d/c by cardiology but restarted by CCM. Will restart at previous rate with no bolus. First level 6 hours after restart.    Neoma Uhrich S 07/21/2017,2:21 AM

## 2017-07-21 NOTE — ED Notes (Signed)
Pt placed on defib pads

## 2017-07-21 NOTE — Progress Notes (Addendum)
[place] for heparin drip Indication: ACS/STEMI  No Known Allergies  Patient Measurements: Height: 6' (182.9 cm) Weight: 220 lb (99.8 kg) IBW/kg (Calculated) : 77.6 Heparin Dosing Weight: 98 kg   Vital Signs: Temp: 99 F (37.2 C) (12/01 1913) Temp Source: Oral (12/01 1913) BP: 97/64 (12/01 1930) Pulse Rate: 85 (12/01 1930)  Labs: Recent Labs    07/20/17 2341 07/21/17 0033 07/21/17 0339 07/21/17 1132 07/21/17 1538 07/21/17 2008  HGB 15.6  --  15.3  --   --   --   HCT 46.7  --  44.2  --   --   --   PLT 252  --  229  --   --   --   APTT  --  129*  --   --   --   --   LABPROT  --  13.9  --   --   --   --   INR  --  1.08  --   --   --   --   HEPARINUNFRC  --   --   --  0.21*  --  0.39  CREATININE 0.99  --  0.84  --   --   --   TROPONINI 4.98*  --  4.05* 3.16* 3.94*  --     Estimated Creatinine Clearance: 111.6 mL/min (by C-G formula based on SCr of 0.84 mg/dL).   Medical History: Past Medical History:  Diagnosis Date  . Diabetes mellitus without complication (HCC)     Medications:  No anticoag in PTA meds.  Assessment: 61 yo male with late presenting inferior MI.   Goal of Therapy:  Heparin level 0.3-0.7 units/ml Monitor platelets by anticoagulation protocol: Yes   Plan:  4000 unit bolus and initial rate of 1350 units/hr. First heparin level 6 hours after start of infusion.   12/01 0330 heparin was d/c by cardiology but restarted by CCM. Will restart at previous rate with no bolus. First level 6 hours after restart.  12/1 1132 HL 0.21 subtherapeutic. RN confirms heparin drip running at 13.5 ml/hr; no line issues or s/sx of bleeding noted by RN. Will order heparin bolus 1400 units IV x1 then increase drip to 1550 units/hr (=15.68ml/hr). Recheck HL in 6h. CBC in AM. Hgb and plt count about stable.   12/1 2008 HL therapeutic x 1. Continue current rate. Will recheck HL in 6 hours.   Laural Benes, Pharm.D.,  BCPS Clinical Pharmacist 07/21/2017,9:04 PM    12/2 0200 heparin level 0.37. Continue current regimen. Recheck heparin level and CBC with tomorrow AM labs.    Sim Boast, PharmD, BCPS  07/22/17 3:18 AM

## 2017-07-21 NOTE — ED Provider Notes (Signed)
[facility] Emergency Department Provider Note   First MD Initiated Contact with Patient 07/21/17 0017     (approximate)  I have reviewed the triage vital signs and the nursing notes.   HISTORY  Chief Complaint Chest Pain    HPI Lawrence Holmes is a 61 y.o. male with history of diabetes and not currently taking medication stating that it was discontinued 1 year ago presents to the emergency department with chest discomfort since yesterday accompanied by dyspnea.  Patient admits to generalized weakness as well.  Current pain score 6 out of 10.   Past Medical History:  Diagnosis Date  . Diabetes mellitus without complication ([place])     There are no active problems to display for this patient.   Past Surgical History:  Procedure Laterality Date  . EYE SURGERY      Prior to Admission medications   Not on File    Allergies No known drug allergies No family history on file.  Social History Social History   Tobacco Use  . Smoking status: Current Every Day Smoker    Packs/day: 1.00  . Smokeless tobacco: Never Used  Substance Use Topics  . Alcohol use: Yes  . Drug use: No    Review of Systems Constitutional: No fever/chills Eyes: No visual changes. ENT: No sore throat. Cardiovascular: Positive for chest pain. Respiratory: Positive for  shortness of breath. Gastrointestinal: No abdominal pain.  No nausea, no vomiting.  No diarrhea.  No constipation. Genitourinary: Negative for dysuria. Musculoskeletal: Negative for neck pain.  Negative for back pain. Integumentary: Negative for rash. Neurological: Negative for headaches, focal weakness or numbness.  ____________________________________________   PHYSICAL EXAM:  VITAL SIGNS: ED Triage Vitals  Enc Vitals Group     BP 07/20/17 2337 137/80     Pulse Rate 07/20/17 2337 89     Resp 07/20/17 2337 18     Temp 07/20/17 2337 97.9 F (36.6 C)     Temp Source 07/20/17 2337 Oral     SpO2  07/20/17 2337 97 %     Weight 07/20/17 2338 99.8 kg (220 lb)     Height 07/20/17 2338 1.829 m (6')     Head Circumference --      Peak Flow --      Pain Score 07/20/17 2337 7     Pain Loc --      Pain Edu? --      Excl. in [place]? --     Constitutional: Alert and oriented. Well appearing and in no acute distress. Eyes: Conjunctivae are normal.  Head: Atraumatic. Mouth/Throat: Mucous membranes are moist. Oropharynx non-erythematous. Neck: No stridor.  Cardiovascular: Normal rate, regular rhythm. Good peripheral circulation. Grossly normal heart sounds. Respiratory: Normal respiratory effort.  No retractions. Lungs CTAB. Gastrointestinal: Soft and nontender. No distention. Musculoskeletal: No lower extremity tenderness nor edema. No gross deformities of extremities. Neurologic:  Normal speech and language. No gross focal neurologic deficits are appreciated.  Skin:  Skin is warm, dry and intact. No rash noted. Psychiatric: Mood and affect are normal. Speech and behavior are normal.  ____________________________________________   LABS (all labs ordered are listed, but only abnormal results are displayed)  Labs Reviewed  BASIC METABOLIC PANEL - Abnormal; Notable for the following components:      Result Value   Sodium 131 (*)    Chloride 96 (*)    Glucose, Bld 401 (*)    Calcium 8.8 (*)    All other components within  normal limits  CBC - Abnormal; Notable for the following components:   WBC 11.5 (*)    All other components within normal limits  TROPONIN I - Abnormal; Notable for the following components:   Troponin I 4.98 (*)    All other components within normal limits   ____________________________________________  EKG  ED ECG REPORT I, [place] N BROWN, the attending physician, did not review this EKG on arrival as this was interpreted by another provider   Date: 07/21/2017  EKG Time: 11:33 PM  Rate: 79  Rhythm: Normal sinus rhythm  Axis: Normal  Intervals: Normal   ST&T Change: 1 mm ST segment elevation in leads III and aVF V3 V4 T wave inversions.  Q waves noted inferiorly to 3 and aVF.   ED ECG REPORT I, [place] N BROWN, reviewed and interpreted this EKG personally  Date: 07/21/2017  EKG Time: 12:22 AM  Rate: 85  Rhythm: Normal sinus rhythm  Axis: Normal  Intervals: Normal  ST&T Change: 2 mm ST segment elevation in leads III and aVF with anterior T waves inversions in V3 and V4.  Q waves also noted to 3 and aVF  ____________________________________________  RADIOLOGY I, [place], personally viewed and evaluated these images (plain radiographs) as part of my medical decision making, as well as reviewing the written report by the radiologist.  Dg Chest 2 View  Result Date: 07/21/2017 CLINICAL DATA:  Chest pain and nausea and vomiting beginning yesterday. EXAM: CHEST  2 VIEW COMPARISON:  None. FINDINGS: The heart size and mediastinal contours are within normal limits. Both lungs are clear. The visualized skeletal structures are unremarkable. IMPRESSION: Negative.  No active cardiopulmonary disease. Electronically Signed   By: Earle Gell M.D.   On: 07/21/2017 00:04    ____________________________________________   PROCEDURES  Critical Care performed:  CRITICAL CARE Performed by: Gregor Hams   Total critical care time: 40 minutes  Critical care time was exclusive of separately billable procedures and treating other patients.  Critical care was necessary to treat or prevent imminent or life-threatening deterioration.  Critical care was time spent personally by me on the following activities: development of treatment plan with patient and/or surrogate as well as nursing, discussions with consultants, evaluation of patient's response to treatment, examination of patient, obtaining history from patient or surrogate, ordering and performing treatments and interventions, ordering and review of laboratory studies, ordering and review  of radiographic studies, pulse oximetry and re-evaluation of patient's condition.   Procedures   ____________________________________________   INITIAL IMPRESSION / ASSESSMENT AND PLAN / ED COURSE  As part of my medical decision making, I reviewed the following data within the electronic MEDICAL RECORD [id number] year old male presented with above-stated history and physical exam concerning for ST elevation MI inferiorly.  As such patient discussed with Dr. Fletcher Anon a cardiologist on call with plan to take the patient to the Cath Lab.  Patient given IV heparin and aspirin in the emergency department. ____________________________________________  FINAL CLINICAL IMPRESSION(S) / ED DIAGNOSES  Final diagnoses:  Acute ST elevation myocardial infarction (STEMI) involving other coronary artery of inferior wall (HCC)     MEDICATIONS GIVEN DURING THIS VISIT:  Medications  heparin injection 4,000 Units (not administered)  aspirin chewable tablet 324 mg (not administered)     ED Discharge Orders    None       Note:  This document was prepared using Dragon voice recognition software and may include unintentional dictation errors.    Gregor Hams,  MD 07/21/17 0045

## 2017-07-21 NOTE — Progress Notes (Addendum)
Dr. Lovena Le notified of patient's c/o increased chest pain. Acknowledged with new orders received for heparin drip (weight adjusted), NTG 0.4mg  SL may repeat X's 2, Lopressor 3mg  IV once now, and Morphine 2mg  IV q30 PRN for pain unrelieved by NTG SL. Continue to assess and monitor.

## 2017-07-22 DIAGNOSIS — I2119 ST elevation (STEMI) myocardial infarction involving other coronary artery of inferior wall: Principal | ICD-10-CM

## 2017-07-22 DIAGNOSIS — I5022 Chronic systolic (congestive) heart failure: Secondary | ICD-10-CM

## 2017-07-22 LAB — CBC
HEMATOCRIT: 41.5 % (ref 40.0–52.0)
HEMOGLOBIN: 14.4 g/dL (ref 13.0–18.0)
MCH: 31.6 pg (ref 26.0–34.0)
MCHC: 34.7 g/dL (ref 32.0–36.0)
MCV: 91.2 fL (ref 80.0–100.0)
Platelets: 248 10*3/uL (ref 150–440)
RBC: 4.55 MIL/uL (ref 4.40–5.90)
RDW: 12.8 % (ref 11.5–14.5)
WBC: 11.9 10*3/uL — ABNORMAL HIGH (ref 3.8–10.6)

## 2017-07-22 LAB — BASIC METABOLIC PANEL
Anion gap: 9 (ref 5–15)
BUN: 19 mg/dL (ref 6–20)
CHLORIDE: 98 mmol/L — AB (ref 101–111)
CO2: 23 mmol/L (ref 22–32)
CREATININE: 0.95 mg/dL (ref 0.61–1.24)
Calcium: 8.8 mg/dL — ABNORMAL LOW (ref 8.9–10.3)
GFR calc Af Amer: >60 mL/min (ref >60)
GFR calc non Af Amer: >60 mL/min (ref >60)
Glucose, Bld: 220 mg/dL — ABNORMAL HIGH (ref 65–99)
Potassium: 3.7 mmol/L (ref 3.5–5.1)
SODIUM: 130 mmol/L — AB (ref 135–145)

## 2017-07-22 LAB — GLUCOSE, CAPILLARY
GLUCOSE-CAPILLARY: 264 mg/dL — AB (ref 65–99)
Glucose-Capillary: 185 mg/dL — ABNORMAL HIGH (ref 65–99)
Glucose-Capillary: 175 mg/dL — ABNORMAL HIGH (ref 65–99)
Glucose-Capillary: 226 mg/dL — ABNORMAL HIGH (ref 65–99)

## 2017-07-22 LAB — HEPARIN LEVEL (UNFRACTIONATED): Heparin Unfractionated: 0.37 IU/mL (ref 0.30–0.70)

## 2017-07-22 MED ORDER — INSULIN GLARGINE 100 UNIT/ML ~~LOC~~ SOLN
10.0000 [IU] | Freq: Every day | SUBCUTANEOUS | Status: DC
  Administered 2017-07-22 – 2017-07-23 (×2): 10 [IU] via SUBCUTANEOUS
  Filled 2017-07-22 (×3): qty 0.1

## 2017-07-22 MED ORDER — LOSARTAN POTASSIUM 25 MG PO TABS
12.5000 mg | ORAL_TABLET | Freq: Every day | ORAL | Status: DC
  Administered 2017-07-23: 12.5 mg via ORAL
  Filled 2017-07-22: qty 1

## 2017-07-22 MED ORDER — ISOSORBIDE MONONITRATE ER 60 MG PO TB24
60.0000 mg | ORAL_TABLET | Freq: Every day | ORAL | Status: DC
  Administered 2017-07-22 – 2017-07-23 (×2): 60 mg via ORAL
  Filled 2017-07-22 (×2): qty 1

## 2017-07-22 MED ORDER — RANOLAZINE ER 500 MG PO TB12
500.0000 mg | ORAL_TABLET | Freq: Two times a day (BID) | ORAL | Status: DC
  Filled 2017-07-22: qty 1

## 2017-07-22 NOTE — Progress Notes (Addendum)
[place] [place] NAME: Lawrence Holmes    MR#:  [id number]  DATE OF BIRTH:  1956/04/26  SUBJECTIVE:  CHIEF COMPLAINT:   Chief Complaint  Patient presents with  . Chest Pain   Mild SOB, on O2  2L. REVIEW OF SYSTEMS:  Review of Systems  Constitutional: Negative for chills, fever and malaise/fatigue.  HENT: Negative for sore throat.   Eyes: Negative for blurred vision and double vision.  Respiratory: Positive for cough and shortness of breath. Negative for hemoptysis, wheezing and stridor.   Cardiovascular: Negative for chest pain, palpitations, orthopnea and leg swelling.  Gastrointestinal: Negative for abdominal pain, blood in stool, diarrhea, melena, nausea and vomiting.  Genitourinary: Negative for dysuria, flank pain and hematuria.  Musculoskeletal: Negative for back pain and joint pain.  Skin: Negative for rash.  Neurological: Negative for dizziness, sensory change, focal weakness, seizures, loss of consciousness, weakness and headaches.  Endo/Heme/Allergies: Negative for polydipsia.  Psychiatric/Behavioral: Negative for depression. The patient is not nervous/anxious.     DRUG ALLERGIES:  No Known Allergies VITALS:  Blood pressure 110/68, pulse 91, temperature 98.2 F (36.8 C), temperature source Oral, resp. rate 20, height 6' (1.829 m), weight 220 lb (99.8 kg), SpO2 94 %. PHYSICAL EXAMINATION:  Physical Exam  Constitutional: He is oriented to person, place, and time and well-developed, well-nourished, and in no distress.  HENT:  Head: Normocephalic.  Mouth/Throat: Oropharynx is clear and moist.  Eyes: Conjunctivae and EOM are normal. Pupils are equal, round, and reactive to light. No scleral icterus.  Neck: Normal range of motion. Neck supple. No JVD present. No tracheal deviation present.  Cardiovascular: Normal rate, regular rhythm and normal heart sounds. Exam reveals no gallop.  No murmur heard. Pulmonary/Chest:  Effort normal. No respiratory distress. He has no wheezes. He has rales.  Abdominal: Soft. Bowel sounds are normal. He exhibits no distension. There is no tenderness. There is no rebound.  Musculoskeletal: Normal range of motion. He exhibits no edema or tenderness.  Neurological: He is alert and oriented to person, place, and time. No cranial nerve deficit.  Skin: No rash noted. No erythema.  Psychiatric: Affect normal.   LABORATORY PANEL:  Male CBC Recent Labs  Lab 07/22/17 0209  WBC 11.9*  HGB 14.4  HCT 41.5  PLT 248   ------------------------------------------------------------------------------------------------------------------ Chemistries  Recent Labs  Lab 07/21/17 0339 07/22/17 0209  NA 133* 130*  K 3.7 3.7  CL 100* 98*  CO2 23 23  GLUCOSE 285* 220*  BUN 14 19  CREATININE 0.84 0.95  CALCIUM 8.9 8.8*  MG 2.0  --    RADIOLOGY:  No results found. ASSESSMENT AND PLAN:   Acute ST elevation myocardial infarction (STEMI) of inferior wall s/p catheterization, RCA lesion which was not amenable to stenting.  Maximum medical management.  Cardiology following. Continue aspirin, Plavix, Lipitor, Coreg, imdur and losartan.   Acute respiratory failure with hypoxia due to acute systolic CHF andIschemic cardiomyopathy: EF 25-30% by LV-gram, ischemic cardiomyopathy. Try to wean off oxygen. Continue imdur, Coreg and losartan per Dr. Aundra Dubin.  Hyponatremia.  Follow-up sodium level.  Diabetes ([place]) -sliding scale insulin with lantus.  Tobacco abuse.  Smoking cessation was counseled for 3-4 minutes.  The patient wants to quit.  All the records are reviewed and case discussed with Care Management/Social Worker. Management plans discussed with the patient, family and they are in agreement.  CODE STATUS: Full Code  TOTAL TIME TAKING CARE OF THIS PATIENT: 78  minutes.   More than 50% of the time was spent in counseling/coordination of care: YES  POSSIBLE D/C IN 2 DAYS,  DEPENDING ON CLINICAL CONDITION.   Lawrence Holmes M.D on 07/22/2017 at 11:51 AM  Between 7am to 6pm - Pager - [phone]  After 6pm go to [email or web address] - [facility]

## 2017-07-22 NOTE — Progress Notes (Signed)
Pt has no complaints today. Family came visit to him this afternoon. Pt is resting at this time.

## 2017-07-22 NOTE — Progress Notes (Signed)
PULMONARY / CRITICAL CARE MEDICINE   Name: Lawrence Holmes MRN: [id number] DOB: [birth year]    ADMISSION DATE:  07/21/2017   CONSULTATION DATE:  07/21/2017  REFERRING MD:  Dr Fletcher Anon  Reason: ICU management acute ST elevation MI status post left heart catheterization   HISTORY OF PRESENT ILLNESS:   61 year old male with a history of uncontrolled type 2 diabetes and current tobacco use presenting with chest pain that started the day before.  Upon arrival in the ED patient complained of nausea and vomiting.  His EKG showed inferior Q waves with if equivocal ST elevation inferiorly and a troponin level of 5.  He was taken emergently to to the Cath Lab for left heart catheterization.  He was found to have severe stenosis in the proximal RCA and distal circumflex not amenable to any interventions.  Medical management was recommended and patient was transferred to the ICU for further observation and management. He is still complaining of chest pain, left substernal, steady, pressure-like in nature, 5/10 in intensity; minimal relief with nitroglycerin; moderate relief with morphine.  Denies nausea vomiting dizziness and headache  SUBJECTIVE: no acute issues overnight. Off nitroglycerin gtt. Chest pain 1/10. Offers no other complaints  VITAL SIGNS: BP 106/62   Pulse 88   Temp 97.8 F (36.6 C) (Oral)   Resp 18   Ht 6' (1.829 m)   Wt 99.8 kg (220 lb)   SpO2 92%   BMI 29.84 kg/m   HEMODYNAMICS:    VENTILATOR SETTINGS:    INTAKE / OUTPUT: I/O last 3 completed shifts: In: 1263 [P.O.:840; I.V.:423] Out: 1550 [Urine:1550]  PHYSICAL EXAMINATION: General: Mild distress Neuro:  AAO X3, CN intact HEENT:  PERRLA, trachea midline, neck is supple, no JVD Cardiovascular: RRR, S1/S2, no MRG, +2 PULSES, no edema Lungs:  CTAB Abdomen:  Soft, NT/ND, + BS X4 Musculoskeletal:  No deformities Skin:  Warm and dry  LABS:  BMET Recent Labs  Lab 07/20/17 2341 07/21/17 0339 07/22/17 0209  NA 131*  133* 130*  K 3.9 3.7 3.7  CL 96* 100* 98*  CO2 25 23 23   BUN 14 14 19   CREATININE 0.99 0.84 0.95  GLUCOSE 401* 285* 220*    Electrolytes Recent Labs  Lab 07/20/17 2341 07/21/17 0339 07/22/17 0209  CALCIUM 8.8* 8.9 8.8*  MG  --  2.0  --   PHOS  --  2.5  --     CBC Recent Labs  Lab 07/20/17 2341 07/21/17 0339 07/22/17 0209  WBC 11.5* 10.4 11.9*  HGB 15.6 15.3 14.4  HCT 46.7 44.2 41.5  PLT 252 229 248    Coag's Recent Labs  Lab 07/21/17 0033  APTT 129*  INR 1.08    Sepsis Markers No results for input(s): LATICACIDVEN, PROCALCITON, O2SATVEN in the last 168 hours.  ABG No results for input(s): PHART, PCO2ART, PO2ART in the last 168 hours.  Liver Enzymes No results for input(s): AST, ALT, ALKPHOS, BILITOT, ALBUMIN in the last 168 hours.  Cardiac Enzymes Recent Labs  Lab 07/21/17 0339 07/21/17 1132 07/21/17 1538  TROPONINI 4.05* 3.16* 3.94*    Glucose Recent Labs  Lab 07/21/17 0819 07/21/17 1204 07/21/17 1610 07/21/17 2122  GLUCAP 329* 262* 251* 274*    Imaging No results found.   STUDIES:  LEFT HEART CATH AND CORONARY ANGIOGRAPHY  Conclusion     Prox Cx lesion is 40% stenosed.  Mid Cx lesion is 40% stenosed.  Dist Cx lesion is 100% stenosed.  Prox RCA lesion is 100%  stenosed.  There is severe left ventricular systolic dysfunction.  LV end diastolic pressure is mildly elevated.  The left ventricular ejection fraction is 25-35% by visual estimate.  There is trivial (1+) mitral regurgitation.   1.  Severe two-vessel coronary artery disease with total occlusion of proximal right coronary artery with well-developed left-to-right collaterals and occluded distal left circumflex also with collaterals.  No significant obstructive disease affecting the LAD. 2.  Severely reduced LV systolic function with an EF of 25-30% with inferior wall akinesis as well as hypokinesis of the basal anterior wall.  Mildly elevated left ventricular  end-diastolic pressure with an LVEDP of 14 mmHg.   3.  Attempted unsuccessful angioplasty of the right coronary artery due to inability to cross with a wire.  Recommendations: This is a late presenting inferior ST elevation myocardial infarction of at least 24 hours duration.  The EKG was with large inferior Q waves.  The left circumflex disease appears to be chronic.  The RCA occlusion appeared to be acute on chronic and thus I decided to probe with a wire to determine the ability to cross.  I was not able to cross with 2 wires.  There are already reasonable collaterals from the left coronary system.  Recommend medical therapy for coronary artery disease. The patient does have cardiomyopathy which seems to be mixed ischemic and nonischemic and out of proportion to his coronary artery disease. I initiated treatment with carvedilol and losartan.  Consider spironolactone before hospital discharge. The patient needs treatment for diabetes as he has not been on medications.  Delays in door to cath time due to patient's late presentation and equivocal EKG changes on presentation.     SIGNIFICANT EVENTS: 07/20/17: Admitted  LINES/TUBES: Peripheral IVs  DISCUSSION: 61 year old male with a history of type 2 diabetes presenting with an acute on chronic inferior wall ST elevation MI.  She is now status post left heart catheterization with no stent placement.  Medical management recommended  ASSESSMENT  Late presenting inferior wall ST elevation MI-Severe two-vessel coronary artery disease with total occlusion of proximal right coronary artery with well-developed left-to-right collaterals and occluded distal left circumflex also with collaterals  Severe LV dysfunction-EF 25-30%  Mixed ischemic and non-ischemic cardiomyopathy  Uncontrolled T2DM  Tobacco use disorder  PLAN Continue carvedilol, aspirin, Lipitor, Plavix, heparin, as needed nitroglycerin, as needed morphine and losartan per  cardiology Blood glucose monitoring with sliding scale insulin coverage Consult to Diabetes education Smoking cessation advice given GI/DVT prophylaxis Full code  Stable for transfer to telemetry  FAMILY  - Updates: no family at bedside. Patient updated on current treatment plan    Ellajane Stong S. [facility] ANP-BC Pulmonary and Critical Care Medicine [facility] Pager [phone] or [phone]  07/22/2017, 7:42 AM

## 2017-07-22 NOTE — Progress Notes (Signed)
Patient ID: Lawrence Holmes, male   DOB: [birth year], 61 y.o.   MRN: [id number]   Progress Note  Patient Name: Lawrence Holmes Date of Encounter: 07/22/2017  Primary Cardiologist: Fletcher Anon  Subjective   Mild chest pain today, only when moves around in bed.  Says much better than at admission.  No dyspnea.    LHC: Totally occluded RCA with collaterals, totally occluded distal LCx with collaterals.  Unable to open RCA, medical management.   Inpatient Medications    Scheduled Meds: . aspirin  81 mg Oral Daily  . atorvastatin  80 mg Oral q1800  . carvedilol  3.125 mg Oral BID WC  . clopidogrel  75 mg Oral Q breakfast  . insulin aspart  0-20 Units Subcutaneous TID WC  . insulin aspart  0-5 Units Subcutaneous QHS  . insulin glargine  10 Units Subcutaneous Daily  . losartan  12.5 mg Oral Daily  . polyethylene glycol  17 g Oral Daily  . ranolazine  500 mg Oral BID  . sodium chloride flush  3 mL Intravenous Q12H   Continuous Infusions: . sodium chloride    . heparin 1,550 Units/hr (07/22/17 0654)   PRN Meds: sodium chloride, acetaminophen, morphine injection, nitroGLYCERIN, ondansetron (ZOFRAN) IV, sodium chloride flush   Vital Signs    Vitals:   07/22/17 1030 07/22/17 1045 07/22/17 1100 07/22/17 1115  BP: 111/74 104/70 104/67 99/66  Pulse: 90 85 87 94  Resp: (!) 23 17 17  (!) 22  Temp:      TempSrc:      SpO2: 93% 94% 94% 94%  Weight:      Height:        Intake/Output Summary (Last 24 hours) at 07/22/2017 1146 Last data filed at 07/22/2017 0654 Gross per 24 hour  Intake 887.97 ml  Output 975 ml  Net -87.03 ml   Filed Weights   07/20/17 2338  Weight: 220 lb (99.8 kg)    Telemetry    NSR - Personally Reviewed   Physical Exam   GEN: No acute distress.   Neck: No JVD Cardiac: RRR, no murmurs, rubs, or gallops.  Respiratory: Clear to auscultation bilaterally. GI: Soft, nontender, non-distended  MS: No edema; No deformity. Neuro:  Nonfocal  Psych: Normal affect    Labs    Chemistry Recent Labs  Lab 07/20/17 2341 07/21/17 0339 07/22/17 0209  NA 131* 133* 130*  K 3.9 3.7 3.7  CL 96* 100* 98*  CO2 25 23 23   GLUCOSE 401* 285* 220*  BUN 14 14 19   CREATININE 0.99 0.84 0.95  CALCIUM 8.8* 8.9 8.8*  GFRNONAA >60 >60 >60  GFRAA >60 >60 >60  ANIONGAP 10 10 9      Hematology Recent Labs  Lab 07/20/17 2341 07/21/17 0339 07/22/17 0209  WBC 11.5* 10.4 11.9*  RBC 5.02 4.82 4.55  HGB 15.6 15.3 14.4  HCT 46.7 44.2 41.5  MCV 93.2 91.6 91.2  MCH 31.2 31.8 31.6  MCHC 33.4 34.7 34.7  RDW 12.9 12.7 12.8  PLT 252 229 248    Cardiac Enzymes Recent Labs  Lab 07/20/17 2341 07/21/17 0339 07/21/17 1132 07/21/17 1538  TROPONINI 4.98* 4.05* 3.16* 3.94*   No results for input(s): TROPIPOC in the last 168 hours.   BNPNo results for input(s): BNP, PROBNP in the last 168 hours.   DDimer No results for input(s): DDIMER in the last 168 hours.   Radiology    Dg Chest 2 View  Result Date: 07/21/2017 CLINICAL DATA:  Chest  pain and nausea and vomiting beginning yesterday. EXAM: CHEST  2 VIEW COMPARISON:  None. FINDINGS: The heart size and mediastinal contours are within normal limits. Both lungs are clear. The visualized skeletal structures are unremarkable. IMPRESSION: Negative.  No active cardiopulmonary disease. Electronically Signed   By: Earle Gell M.D.   On: 07/21/2017 00:04    Patient Profile     61 y.o. male with history of diabetes with late presenting inferior MI.  Unable to open RCA at PCI.   Assessment & Plan    1. CAD: Late-presenting inferior MI.  On cath, pRCA and dLCx both occluded with left=>right collaterals.  Attempted PCI of RCA, unsuccessful.  Medical management.  Still with mild chest pain.  - Continue ASA, Plavix, statin.  - Add Imdur 60 mg daily (currently off NTG gtt).  - Continue Coreg 3.125 mg bid.  2. Ischemic cardiomyopathy: EF 25-30% by LV-gram, ischemic cardiomyopathy.  Unable to open arteries, so this may end up  being chronic. BP relatively soft today.  He does not look volume overloaded and LVEDP was not elevated at cath.  - Can continue low dose Coreg 3.125 mg bid.  - Decrease losartan to 12.5 daily with soft BP.  - Add spironolactone 12.5 tomorrow  - Will order echo.  3. Diabetes: Per primary team.  Mobilize with cardiac rehab.   For questions or updates, please contact [place] Please consult [email or web address] for contact info under Cardiology/STEMI.      Signed, Loralie Champagne, MD  07/22/2017, 11:46 AM

## 2017-07-23 ENCOUNTER — Inpatient Hospital Stay (HOSPITAL_COMMUNITY)
Admit: 2017-07-23 | Discharge: 2017-07-23 | Disposition: A | Discharge status: Home / Self Care | Payer: Commercial Managed Care - HMO | Attending: Cardiology | Admitting: Cardiology

## 2017-07-23 ENCOUNTER — Telehealth: Payer: Self-pay | Admitting: Cardiovascular Disease

## 2017-07-23 ENCOUNTER — Encounter: Payer: Self-pay | Admitting: Cardiovascular Disease

## 2017-07-23 DIAGNOSIS — I340 Nonrheumatic mitral (valve) insufficiency: Secondary | ICD-10-CM

## 2017-07-23 DIAGNOSIS — I5021 Acute systolic (congestive) heart failure: Secondary | ICD-10-CM

## 2017-07-23 LAB — CBC
HEMATOCRIT: 43.8 % (ref 40.0–52.0)
Hemoglobin: 15 g/dL (ref 13.0–18.0)
MCH: 31.6 pg (ref 26.0–34.0)
MCHC: 34.3 g/dL (ref 32.0–36.0)
MCV: 92.2 fL (ref 80.0–100.0)
Platelets: 282 10*3/uL (ref 150–440)
RBC: 4.75 MIL/uL (ref 4.40–5.90)
RDW: 12.9 % (ref 11.5–14.5)
WBC: 10.4 10*3/uL (ref 3.8–10.6)

## 2017-07-23 LAB — GLUCOSE, CAPILLARY
GLUCOSE-CAPILLARY: 311 mg/dL — AB (ref 65–99)
Glucose-Capillary: 193 mg/dL — ABNORMAL HIGH (ref 65–99)
Glucose-Capillary: 200 mg/dL — ABNORMAL HIGH (ref 65–99)
Glucose-Capillary: 239 mg/dL — ABNORMAL HIGH (ref 65–99)

## 2017-07-23 LAB — HEPARIN LEVEL (UNFRACTIONATED)
HEPARIN UNFRACTIONATED: 0.21 [IU]/mL — AB (ref 0.30–0.70)
HEPARIN UNFRACTIONATED: 0.1 [IU]/mL — AB (ref 0.30–0.70)

## 2017-07-23 LAB — ECHOCARDIOGRAM COMPLETE

## 2017-07-23 MED ORDER — LIVING WELL WITH DIABETES BOOK
Freq: Once | Status: AC
Start: 2017-07-23 — End: 2017-07-23
  Administered 2017-07-23: 10:00:00
  Filled 2017-07-23: qty 1

## 2017-07-23 MED ORDER — ISOSORBIDE MONONITRATE ER 30 MG PO TB24: 30.0000 mg | ORAL_TABLET | Freq: Every day | ORAL | Status: DC

## 2017-07-23 MED ORDER — ISOSORBIDE MONONITRATE ER 30 MG PO TB24: 30.0000 mg | ORAL_TABLET | Freq: Every day | ORAL | 1 refills | Status: DC

## 2017-07-23 MED ORDER — CLOPIDOGREL BISULFATE 75 MG PO TABS: 75.0000 mg | ORAL_TABLET | Freq: Every day | ORAL | 2 refills | Status: DC

## 2017-07-23 MED ORDER — LOSARTAN POTASSIUM 25 MG PO TABS: 12.5000 mg | ORAL_TABLET | Freq: Every day | ORAL | 1 refills | Status: DC

## 2017-07-23 MED ORDER — METFORMIN HCL 1000 MG PO TABS: 1000.0000 mg | ORAL_TABLET | Freq: Two times a day (BID) | ORAL | 1 refills | Status: DC

## 2017-07-23 MED ORDER — METFORMIN HCL 500 MG PO TABS: 1000.0000 mg | ORAL_TABLET | Freq: Two times a day (BID) | ORAL | Status: DC

## 2017-07-23 MED ORDER — INSULIN ASPART 100 UNIT/ML ~~LOC~~ SOLN
4.0000 [IU] | Freq: Three times a day (TID) | SUBCUTANEOUS | Status: DC
  Administered 2017-07-23: 4 [IU] via SUBCUTANEOUS
  Filled 2017-07-23: qty 1

## 2017-07-23 MED ORDER — ASPIRIN 81 MG PO CHEW: 81.0000 mg | CHEWABLE_TABLET | Freq: Every day | ORAL | 2 refills | Status: AC

## 2017-07-23 MED ORDER — ATORVASTATIN CALCIUM 80 MG PO TABS: 80.0000 mg | ORAL_TABLET | Freq: Every day | ORAL | 2 refills | Status: DC

## 2017-07-23 MED ORDER — ISOSORBIDE MONONITRATE ER 60 MG PO TB24: 60.0000 mg | ORAL_TABLET | Freq: Every day | ORAL | Status: DC

## 2017-07-23 MED ORDER — NITROGLYCERIN 0.4 MG SL SUBL: 0.4000 mg | SUBLINGUAL_TABLET | SUBLINGUAL | 2 refills | Status: DC | PRN

## 2017-07-23 MED ORDER — INSULIN STARTER KIT- PEN NEEDLES (ENGLISH)
1.0000 | Freq: Once | Status: DC
  Filled 2017-07-23: qty 1

## 2017-07-23 MED ORDER — ISOSORBIDE MONONITRATE ER 60 MG PO TB24: 60.0000 mg | ORAL_TABLET | Freq: Every day | ORAL | 1 refills | Status: DC

## 2017-07-23 MED ORDER — CARVEDILOL 3.125 MG PO TABS: 3.1250 mg | ORAL_TABLET | Freq: Two times a day (BID) | ORAL | 2 refills | Status: DC

## 2017-07-23 NOTE — Progress Notes (Addendum)
Inpatient Diabetes Program Recommendations  AACE/ADA: New Consensus Statement on Inpatient Glycemic Control (2015)  Target Ranges:  Prepandial:   less than 140 mg/dL      Peak postprandial:   less than 180 mg/dL (1-2 hours)      Critically ill patients:  140 - 180 mg/dL   Results for Lawrence Holmes, Lawrence Holmes (MRN [id number]) as of 07/23/2017 08:40  Ref. Range 07/22/2017 07:34 07/22/2017 12:14 07/22/2017 17:09 07/22/2017 20:33 07/23/2017 07:21  Glucose-Capillary Latest Ref Range: 65 - 99 mg/dL 185 (H) 175 (H) 264 (H) 226 (H) 193 (H)  Results for Lawrence Holmes, Lawrence Holmes (MRN [id number]) as of 07/23/2017 08:40  Ref. Range 07/21/2017 03:39  Hemoglobin A1C Latest Ref Range: 4.8 - 5.6 % 11.4 (H)   Review of Glycemic Control  Diabetes history: DM2 Outpatient Diabetes medications: None listed in chart (per old note in care everywhere pt use to take Metformin 1000 mg BID) Current orders for Inpatient glycemic control: Lantus 10 units daily, Novolog 0-20 units TID with meals, Novolog 0-5 units QHS  Inpatient Diabetes Program Recommendations: Insulin - Meal Coverage: Please consider ordering Novolog 4 units TID with meals for meal coverage if patient eats at least 50% of meals. HgbA1C: A1C 11.4% on 07/21/17 indicating an average glucose of 280 mg/dl over the past 2-3 months. MD, please indicate in note what discharge plan will be for DM management (will pt discharge new to insulin?).  NOTE: Ordered Living Well with Diabetes book, RD consult for diet education, and RNs to educate patient on DM and insulin (in case patient will discharge new to insulin). Will plan to talk with patient today.  Addendum 07/23/17@13 :31- Spoke with patient about diabetes and home regimen for diabetes control. Patient reports that he is followed by PCP for diabetes management and currently he is not taking any DM medications. Patient reports that he was taking Metformin as an outpatient and his PCP took him off Metformin about 6 months ago because he was  having hypoglycemia.  Patient states that he checks his glucose 3 times per week and it had been running in the low 100's mg/dl.   Inquired about prior A1C and patient reports that his last A1C value was 7%. Discussed A1C results (11.4% on 07/21/17) and explained that his current A1C indicates an average glucose of 280 mg/dl over the past 2-3 months. Discussed glucose and A1C goals. Discussed importance of checking CBGs and maintaining good CBG control to prevent long-term and short-term complications. Explained how hyperglycemia leads to damage within blood vessels which lead to the common complications seen with uncontrolled diabetes. Stressed to the patient the importance of improving glycemic control to prevent further complications from uncontrolled diabetes. Discussed impact of nutrition, exercise, stress, sickness, and medications on diabetes control.Patient admits that he has not been following a carb modified diet and  that he drinks mostly water but admits to drinking sweet tea and a regular soda from time to time. Patient states that he knows what he should be eating and he plans to make changes with his diet. 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 Encouraged patient to try to incorporate activity into his day at least 30 minutes per day.  Discussed carbohydrates, carbohydrate goals per day and meal, along with portion sizes. Patient reports that he prefers to use oral DM medication and the attending doctor has told him he will be restarted on Metformin and will follow up with his  PCP regarding DM control.  Encouraged patient to check his glucose at least once a day and to keep a log book of glucose readings which he will need to take to doctor appointments. Encouraged patient to read the Living Well with Diabetes booklet he has at bedside. Patient verbalized understanding of information discussed and he states that he has no further questions at  this time related to diabetes.  Thanks, Barnie Alderman, RN, MSN, CDE Diabetes Coordinator Inpatient Diabetes Program [phone] (Team Pager from 8am to 5pm)

## 2017-07-23 NOTE — Progress Notes (Signed)
Patient discharged via wheelchair and private vehicle. All medication and discharge information given patient verbalized understanding. Tele box off and returned. IV removed. No complaints at this time. Denies needs for home. No distress noted at time of discharge.

## 2017-07-23 NOTE — Progress Notes (Signed)
ANTICOAGULATION CONSULT NOTE   Pharmacy Consult for heparin drip Indication: ACS/STEMI  No Known Allergies  Patient Measurements: Height: 6' (182.9 cm) Weight: 220 lb (99.8 kg) IBW/kg (Calculated) : 77.6 Heparin Dosing Weight: 98 kg   Vital Signs: Temp: 98.5 F (36.9 C) (12/03 0411) Temp Source: Oral (12/03 0411) BP: 98/47 (12/03 0411) Pulse Rate: 88 (12/03 0411)  Labs: Recent Labs    07/20/17 2341 07/21/17 0033 07/21/17 0339  07/21/17 1132 07/21/17 1538 07/21/17 2008 07/22/17 0209 07/23/17 0441  HGB 15.6  --  15.3  --   --   --   --  14.4 15.0  HCT 46.7  --  44.2  --   --   --   --  41.5 43.8  PLT 252  --  229  --   --   --   --  248 282  APTT  --  129*  --   --   --   --   --   --   --   LABPROT  --  13.9  --   --   --   --   --   --   --   INR  --  1.08  --   --   --   --   --   --   --   HEPARINUNFRC  --   --   --    < > 0.21*  --  0.39 0.37 0.21*  CREATININE 0.99  --  0.84  --   --   --   --  0.95  --   TROPONINI 4.98*  --  4.05*  --  3.16* 3.94*  --   --   --    < > = values in this interval not displayed.    Estimated Creatinine Clearance: 98.6 mL/min (by C-G formula based on SCr of 0.95 mg/dL).   Medical History: Past Medical History:  Diagnosis Date  . Diabetes mellitus without complication (HCC)     Medications:  No anticoag in PTA meds.  Assessment: 61 yo male with late presenting inferior MI.   Goal of Therapy:  Heparin level 0.3-0.7 units/ml Monitor platelets by anticoagulation protocol: Yes   Plan:  4000 unit bolus and initial rate of 1350 units/hr. First heparin level 6 hours after start of infusion.   12/01 0330 heparin was d/c by cardiology but restarted by CCM. Will restart at previous rate with no bolus. First level 6 hours after restart.  12/1 1132 HL 0.21 subtherapeutic. RN confirms heparin drip running at 13.5 ml/hr; no line issues or s/sx of bleeding noted by RN. Will order heparin bolus 1400 units IV x1 then increase drip to  1550 units/hr (=15.51ml/hr). Recheck HL in 6h. CBC in AM. Hgb and plt count about stable.   12/1 2008 HL therapeutic x 1. Continue current rate. Will recheck HL in 6 hours.   Luisana Lutzke S, Pharm.D., BCPS Clinical Pharmacist 07/23/2017,5:43 AM    12/2 0200 heparin level 0.37. Continue current regimen. Recheck heparin level and CBC with tomorrow AM labs.    12/3 AM heparin level 0.21. Per RN drip had been paused for an indefinite time before labs drawn. Will redraw in 6 hours to confirm.  Sim Boast, PharmD, BCPS  07/23/17 5:43 AM

## 2017-07-23 NOTE — Progress Notes (Signed)
Patient independent in the room and ambulated around the nurses station twice. No complaints of chest pain or shortness of breath expressed. Tolerated well and states he feels great and cant wait to go home. Will let Dr. Fletcher Anon know.

## 2017-07-23 NOTE — Progress Notes (Signed)
*  PRELIMINARY RESULTS* Echocardiogram 2D Echocardiogram has been performed.  Lawrence Holmes 07/23/2017, 2:25 PM

## 2017-07-23 NOTE — Progress Notes (Signed)
Nutrition Education Note   RD consulted for nutrition education regarding diabetes.   Lab Results  Component Value Date   HGBA1C 11.4 (H) 07/21/2017   RD provided "Low Sodium Nutrition Therapy" handout from the Academy of [employer]. Reviewed patient's dietary recall. Provided examples on ways to decrease sodium intake in diet. Discouraged intake of processed foods and use of salt shaker. Encouraged fresh fruits and vegetables as well as whole grain sources of carbohydrates to maximize fiber intake.   RD provided "Nutrition and Type II Diabetes" handout from the Academy of [employer] and [employer]etics. Discussed different food groups and their effects on blood sugar, emphasizing carbohydrate-containing foods. Provided list of carbohydrates and recommended serving sizes of common foods.  Discussed importance of controlled and consistent carbohydrate intake throughout the day. Provided examples of ways to balance meals/snacks and encouraged intake of high-fiber, whole grain complex carbohydrates. Teach back method used.  Expect good compliance.  Body mass index is 29.84 kg/m. Pt meets criteria for normal weight based on current BMI.  Current diet order is CHO modified, low sodium, patient is consuming approximately 100% of meals at this time. Labs and medications reviewed. No further nutrition interventions warranted at this time. RD contact information provided. If additional nutrition issues arise, please re-consult RD.  Koleen Distance MS, RD, LDN Pager #- [phone] After Hours Pager: [phone]

## 2017-07-23 NOTE — Discharge Instructions (Signed)
Nitroglycerin sublingual tablets What is this medicine? NITROGLYCERIN (nye troe GLI ser in) is a type of vasodilator. It relaxes blood vessels, increasing the blood and oxygen supply to your heart. This medicine is used to relieve chest pain caused by angina. It is also used to prevent chest pain before activities like climbing stairs, going outdoors in cold weather, or sexual activity. This medicine may be used for other purposes; ask your health care provider or pharmacist if you have questions. COMMON BRAND NAME(S): Nitroquick, Nitrostat, Nitrotab What should I tell my health care provider before I take this medicine? They need to know if you have any of these conditions: -anemia -head injury, recent stroke, or bleeding in the brain -liver disease -previous heart attack -an unusual or allergic reaction to nitroglycerin, other medicines, foods, dyes, or preservatives -pregnant or trying to get pregnant -breast-feeding How should I use this medicine? Take this medicine by mouth as needed. At the first sign of an angina attack (chest pain or tightness) place one tablet under your tongue. You can also take this medicine 5 to 10 minutes before an event likely to produce chest pain. Follow the directions on the prescription label. Let the tablet dissolve under the tongue. Do not swallow whole. Replace the dose if you accidentally swallow it. It will help if your mouth is not dry. Saliva around the tablet will help it to dissolve more quickly. Do not eat or drink, smoke or chew tobacco while a tablet is dissolving. If you are not better within 5 minutes after taking ONE dose of nitroglycerin, call 9-1-1 immediately to seek emergency medical care. Do not take more than 3 nitroglycerin tablets over 15 minutes. If you take this medicine often to relieve symptoms of angina, your doctor or health care professional may provide you with different instructions to manage your symptoms. If symptoms do not go away  after following these instructions, it is important to call 9-1-1 immediately. Do not take more than 3 nitroglycerin tablets over 15 minutes. Talk to your pediatrician regarding the use of this medicine in children. Special care may be needed. Overdosage: If you think you have taken too much of this medicine contact a poison control center or emergency room at once. NOTE: This medicine is only for you. Do not share this medicine with others. What if I miss a dose? This does not apply. This medicine is only used as needed. What may interact with this medicine? Do not take this medicine with any of the following medications: -certain migraine medicines like ergotamine and dihydroergotamine (DHE) -medicines used to treat erectile dysfunction like sildenafil, tadalafil, and vardenafil -riociguat This medicine may also interact with the following medications: -alteplase -aspirin -heparin -medicines for high blood pressure -medicines for mental depression -other medicines used to treat angina -phenothiazines like chlorpromazine, mesoridazine, prochlorperazine, thioridazine This list may not describe all possible interactions. Give your health care provider a list of all the medicines, herbs, non-prescription drugs, or dietary supplements you use. Also tell them if you smoke, drink alcohol, or use illegal drugs. Some items may interact with your medicine. What should I watch for while using this medicine? Tell your doctor or health care professional if you feel your medicine is no longer working. Keep this medicine with you at all times. Sit or lie down when you take your medicine to prevent falling if you feel dizzy or faint after using it. Try to remain calm. This will help you to feel better faster. If you feel  dizzy, take several deep breaths and lie down with your feet propped up, or bend forward with your head resting between your knees. You may get drowsy or dizzy. Do not drive, use machinery,  or do anything that needs mental alertness until you know how this drug affects you. Do not stand or sit up quickly, especially if you are an older patient. This reduces the risk of dizzy or fainting spells. Alcohol can make you more drowsy and dizzy. Avoid alcoholic drinks. Do not treat yourself for coughs, colds, or pain while you are taking this medicine without asking your doctor or health care professional for advice. Some ingredients may increase your blood pressure. What side effects may I notice from receiving this medicine? Side effects that you should report to your doctor or health care professional as soon as possible: -blurred vision -dry mouth -skin rash -sweating -the feeling of extreme pressure in the head -unusually weak or tired Side effects that usually do not require medical attention (report to your doctor or health care professional if they continue or are bothersome): -flushing of the face or neck -headache -irregular heartbeat, palpitations -nausea, vomiting This list may not describe all possible side effects. Call your doctor for medical advice about side effects. You may report side effects to FDA at 1-800-FDA-1088. Where should I keep my medicine? Keep out of the reach of children. Store at room temperature between 20 and 25 degrees C (68 and 77 degrees F). Store in [facility] [employer]. Protect from light and moisture. Keep tightly closed. Throw away any unused medicine after the expiration date. NOTE: This sheet is a summary. It may not cover all possible information. If you have questions about this medicine, talk to your doctor, pharmacist, or health care provider.  2018 Elsevier/Gold Standard (2013-06-05 17:57:36) Metformin extended-release tablets What is this medicine? METFORMIN (met FOR min) is used to treat type 2 diabetes. It helps to control blood sugar. Treatment is combined with diet and exercise. This medicine can be used alone or with other medicines  for diabetes. This medicine may be used for other purposes; ask your health care provider or pharmacist if you have questions. COMMON BRAND NAME(S): Fortamet, Glucophage XR, Glumetza What should I tell my health care provider before I take this medicine? They need to know if you have any of these conditions: -anemia -dehydration -heart disease -frequently drink alcohol-containing beverages -kidney disease -liver disease -polycystic ovary syndrome -serious infection or injury -vomiting -an unusual or allergic reaction to metformin, other medicines, foods, dyes, or preservatives -pregnant or trying to get pregnant -breast-feeding How should I use this medicine? Take this medicine by mouth with a glass of water. Follow the directions on the prescription label. Take this medicine with food. Take your medicine at regular intervals. Do not take your medicine more often than directed. Do not stop taking except on your doctor's advice. Talk to your pediatrician regarding the use of this medicine in children. Special care may be needed. Overdosage: If you think you have taken too much of this medicine contact a poison control center or emergency room at once. NOTE: This medicine is only for you. Do not share this medicine with others. What if I miss a dose? If you miss a dose, take it as soon as you can. If it is almost time for your next dose, take only that dose. Do not take double or extra doses. What may interact with this medicine? Do not take this medicine with any of the  following medications: -dofetilide -certain contrast medicines given before X-rays, CT scans, MRI, or other procedures This medicine may also interact with the following medications: -acetazolamide -certain antiviral medicines for HIV or AIDS or for hepatitis, like adefovir, dolutegravir, emtricitabine, entecavir, lamivudine, paritaprevir, or  tenofovir -cimetidine -cobicistat -crizotinib -dichlorphenamide -digoxin -diuretics -male hormones, like estrogens or progestins and birth control pills -glycopyrrolate -isoniazid -lamotrigine -medicines for blood pressure, heart disease, irregular heart beat -memantine -midodrine -methazolamide -morphine -niacin -phenothiazines like chlorpromazine, mesoridazine, prochlorperazine, thioridazine -phenytoin -procainamide -propantheline -quinidine -quinine -ranitidine -ranolazine -steroid medicines like prednisone or cortisone -stimulant medicines for attention disorders, weight loss, or to stay awake -thyroid medicines -topiramate -trimethoprim -trospium -vancomycin -vandetanib -zonisamide This list may not describe all possible interactions. Give your health care provider a list of all the medicines, herbs, non-prescription drugs, or dietary supplements you use. Also tell them if you smoke, drink alcohol, or use illegal drugs. Some items may interact with your medicine. What should I watch for while using this medicine? Visit your doctor or health care professional for regular checks on your progress. A test called the HbA1C (A1C) will be monitored. This is a simple blood test. It measures your blood sugar control over the last 2 to 3 months. You will receive this test every 3 to 6 months. Learn how to check your blood sugar. Learn the symptoms of low and high blood sugar and how to manage them. Always carry a quick-source of sugar with you in case you have symptoms of low blood sugar. Examples include hard sugar candy or glucose tablets. Make sure others know that you can choke if you eat or drink when you develop serious symptoms of low blood sugar, such as seizures or unconsciousness. They must get medical help at once. Tell your doctor or health care professional if you have high blood sugar. You might need to change the dose of your medicine. If you are sick or exercising  more than usual, you might need to change the dose of your medicine. Do not skip meals. Ask your doctor or health care professional if you should avoid alcohol. Many nonprescription cough and cold products contain sugar or alcohol. These can affect blood sugar. This medicine may cause ovulation in premenopausal women who do not have regular monthly periods. This may increase your chances of becoming pregnant. You should not take this medicine if you become pregnant or think you may be pregnant. Talk with your doctor or health care professional about your birth control options while taking this medicine. Contact your doctor or health care professional right away if think you are pregnant. The tablet shell for some brands of this medicine does not dissolve. This is normal. The tablet shell may appear whole in the stool. This is not a cause for concern. If you are going to need surgery, a MRI, CT scan, or other procedure, tell your doctor that you are taking this medicine. You may need to stop taking this medicine before the procedure. Wear a medical ID bracelet or chain, and carry a card that describes your disease and details of your medicine and dosage times. What side effects may I notice from receiving this medicine? Side effects that you should report to your doctor or health care professional as soon as possible: -allergic reactions like skin rash, itching or hives, swelling of the face, lips, or tongue -breathing problems -feeling faint or lightheaded, falls -muscle aches or pains -signs and symptoms of low blood sugar such as feeling anxious, confusion, dizziness,  increased hunger, unusually weak or tired, sweating, shakiness, cold, irritable, headache, blurred vision, fast heartbeat, loss of consciousness -slow or irregular heartbeat -unusual stomach pain or discomfort -unusually tired or weak Side effects that usually do not require medical attention (report to your doctor or health care  professional if they continue or are bothersome): -diarrhea -headache -heartburn -metallic taste in mouth -nausea -stomach gas, upset This list may not describe all possible side effects. Call your doctor for medical advice about side effects. You may report side effects to FDA at 1-800-FDA-1088. Where should I keep my medicine? Keep out of the reach of children. Store at room temperature between 15 and 30 degrees C (59 and 86 degrees F). Protect from light. Throw away any unused medicine after the expiration date. NOTE: This sheet is a summary. It may not cover all possible information. If you have questions about this medicine, talk to your doctor, pharmacist, or health care provider.  2018 Elsevier/Gold Standard (2016-02-16 15:47:35) Losartan tablets What is this medicine? LOSARTAN (loe SAR tan) is used to treat high blood pressure and to reduce the risk of stroke in certain patients. This drug also slows the progression of kidney disease in patients with diabetes. This medicine may be used for other purposes; ask your health care provider or pharmacist if you have questions. COMMON BRAND NAME(S): Cozaar What should I tell my health care provider before I take this medicine? They need to know if you have any of these conditions: -heart failure -kidney or liver disease -an unusual or allergic reaction to losartan, other medicines, foods, dyes, or preservatives -pregnant or trying to get pregnant -breast-feeding How should I use this medicine? Take this medicine by mouth with a glass of water. Follow the directions on the prescription label. This medicine can be taken with or without food. Take your doses at regular intervals. Do not take your medicine more often than directed. Talk to your pediatrician regarding the use of this medicine in children. Special care may be needed. Overdosage: If you think you have taken too much of this medicine contact a poison control center or emergency  room at once. NOTE: This medicine is only for you. Do not share this medicine with others. What if I miss a dose? If you miss a dose, take it as soon as you can. If it is almost time for your next dose, take only that dose. Do not take double or extra doses. What may interact with this medicine? -blood pressure medicines -diuretics, especially triamterene, spironolactone, or amiloride -fluconazole -NSAIDs, medicines for pain and inflammation, like ibuprofen or naproxen -potassium salts or potassium supplements -rifampin This list may not describe all possible interactions. Give your health care provider a list of all the medicines, herbs, non-prescription drugs, or dietary supplements you use. Also tell them if you smoke, drink alcohol, or use illegal drugs. Some items may interact with your medicine. What should I watch for while using this medicine? Visit your doctor or health care professional for regular checks on your progress. Check your blood pressure as directed. Ask your doctor or health care professional what your blood pressure should be and when you should contact him or her. Call your doctor or health care professional if you notice an irregular or fast heart beat. Women should inform their doctor if they wish to become pregnant or think they might be pregnant. There is a potential for serious side effects to an unborn child, particularly in the second or third trimester. Talk  to your health care professional or pharmacist for more information. You may get drowsy or dizzy. Do not drive, use machinery, or do anything that needs mental alertness until you know how this drug affects you. Do not stand or sit up quickly, especially if you are an older patient. This reduces the risk of dizzy or fainting spells. Alcohol can make you more drowsy and dizzy. Avoid alcoholic drinks. Avoid salt substitutes unless you are told otherwise by your doctor or health care professional. Do not treat  yourself for coughs, colds, or pain while you are taking this medicine without asking your doctor or health care professional for advice. Some ingredients may increase your blood pressure. What side effects may I notice from receiving this medicine? Side effects that you should report to your doctor or health care professional as soon as possible: -confusion, dizziness, light headedness or fainting spells -decreased amount of urine passed -difficulty breathing or swallowing, hoarseness, or tightening of the throat -fast or irregular heart beat, palpitations, or chest pain -skin rash, itching -swelling of your face, lips, tongue, hands, or feet Side effects that usually do not require medical attention (report to your doctor or health care professional if they continue or are bothersome): -cough -decreased sexual function or desire -headache -nasal congestion or stuffiness -nausea or stomach pain -sore or cramping muscles This list may not describe all possible side effects. Call your doctor for medical advice about side effects. You may report side effects to FDA at 1-800-FDA-1088. Where should I keep my medicine? Keep out of the reach of children. Store at room temperature between 15 and 30 degrees C (59 and 86 degrees F). Protect from light. Keep container tightly closed. Throw away any unused medicine after the expiration date. NOTE: This sheet is a summary. It may not cover all possible information. If you have questions about this medicine, talk to your doctor, pharmacist, or health care provider.  2018 Elsevier/Gold Standard (2007-10-18 16:42:18) Clopidogrel tablets What is this medicine? CLOPIDOGREL (kloh PID oh grel) helps to prevent blood clots. This medicine is used to prevent heart attack, stroke, or other vascular events in people who are at high risk. This medicine may be used for other purposes; ask your health care provider or pharmacist if you have questions. COMMON BRAND  NAME(S): Plavix What should I tell my health care provider before I take this medicine? They need to know if you have any of the following conditions: -bleeding disorders -bleeding in the brain -having surgery -history of stomach bleeding -an unusual or allergic reaction to clopidogrel, other medicines, foods, dyes, or preservatives -pregnant or trying to get pregnant -breast-feeding How should I use this medicine? Take this medicine by mouth with a glass of water. Follow the directions on the prescription label. You may take this medicine with or without food. If it upsets your stomach, take it with food. Take your medicine at regular intervals. Do not take it more often than directed. Do not stop taking except on your doctor's advice. A special MedGuide will be given to you by the pharmacist with each prescription and refill. Be sure to read this information carefully each time. Talk to your pediatrician regarding the use of this medicine in children. Special care may be needed. Overdosage: If you think you have taken too much of this medicine contact a poison control center or emergency room at once. NOTE: This medicine is only for you. Do not share this medicine with others. What if I miss a  dose? If you miss a dose, take it as soon as you can. If it is almost time for your next dose, take only that dose. Do not take double or extra doses. What may interact with this medicine? Do not take this medicine with the following medications: -dasabuvir; ombitasvir; paritaprevir; ritonavir -defibrotide This medicine may also interact with the following medications: -antiviral medicines for HIV or AIDS -aspirin -certain medicines for depression like citalopram, fluoxetine, fluvoxamine -certain medicines for fungal infections like ketoconazole, fluconazole, voriconazole -certain medicines for seizures like felbamate, oxcarbazepine, phenytoin -certain medicines for stomach problems like  cimetidine, omeprazole, esomeprazole -certain medicines that treat or prevent blood clots like warfarin, enoxaparin, dalteparin, apixaban, dabigatran, rivaroxaban, ticlopidine -chloramphenicol -cilostazol -fluvastatin -isoniazid -modafinil -nicardipine -NSAIDS, medicines for pain and inflammation, like ibuprofen or naproxen -quinine -repaglinide -tamoxifen -tolbutamide -topiramate -torsemide This list may not describe all possible interactions. Give your health care provider a list of all the medicines, herbs, non-prescription drugs, or dietary supplements you use. Also tell them if you smoke, drink alcohol, or use illegal drugs. Some items may interact with your medicine. What should I watch for while using this medicine? Visit your doctor or health care professional for regular check ups. Do not stop taking your medicine unless your doctor tells you to. Notify your doctor or health care professional and seek emergency treatment if you develop breathing problems; changes in vision; chest pain; severe, sudden headache; pain, swelling, warmth in the leg; trouble speaking; sudden numbness or weakness of the face, arm or leg. These can be signs that your condition has gotten worse. If you are going to have surgery or dental work, tell your doctor or health care professional that you are taking this medicine. Certain genetic factors may reduce the effect of this medicine. Your doctor may use genetic tests to determine treatment. What side effects may I notice from receiving this medicine? Side effects that you should report to your doctor or health care professional as soon as possible: -allergic reactions like skin rash, itching or hives, swelling of the face, lips, or tongue -signs and symptoms of bleeding such as bloody or black, tarry stools; red or dark-brown urine; spitting up blood or brown material that looks like coffee grounds; red spots on the skin; unusual bruising or bleeding from  the eye, gums, or nose -signs and symptoms of a blood clot such as breathing problems; changes in vision; chest pain; severe, sudden headache; pain, swelling, warmth in the leg; trouble speaking; sudden numbness or weakness of the face, arm or leg Side effects that usually do not require medical attention (report to your doctor or health care professional if they continue or are bothersome): -constipation -diarrhea -headache -upset stomach This list may not describe all possible side effects. Call your doctor for medical advice about side effects. You may report side effects to FDA at 1-800-FDA-1088. Where should I keep my medicine? Keep out of the reach of children. Store at room temperature of 59 to 86 degrees F (15 to 30 degrees C). Throw away any unused medicine after the expiration date. NOTE: This sheet is a summary. It may not cover all possible information. If you have questions about this medicine, talk to your doctor, pharmacist, or health care provider.  2018 [employer]/Gold Standard (2015-05-13 10:00:44) Isosorbide Mononitrate extended-release tablets What is this medicine? ISOSORBIDE MONONITRATE (eye soe SOR bide mon oh NYE trate) is a vasodilator. It relaxes blood vessels, increasing the blood and oxygen supply to your heart. This medicine is  used to prevent chest pain caused by angina. It will not help to stop an episode of chest pain. This medicine may be used for other purposes; ask your health care provider or pharmacist if you have questions. COMMON BRAND NAME(S): Imdur, Isotrate ER What should I tell my health care provider before I take this medicine? They need to know if you have any of these conditions: -previous heart attack or heart failure -an unusual or allergic reaction to isosorbide mononitrate, nitrates, other medicines, foods, dyes, or preservatives -pregnant or trying to get pregnant -breast-feeding How should I use this medicine? Take this medicine by mouth  with a glass of water. Follow the directions on the prescription label. Do not crush or chew. Take your medicine at regular intervals. Do not take your medicine more often than directed. Do not stop taking this medicine except on the advice of your doctor or health care professional. Talk to your pediatrician regarding the use of this medicine in children. Special care may be needed. Overdosage: If you think you have taken too much of this medicine contact a poison control center or emergency room at once. NOTE: This medicine is only for you. Do not share this medicine with others. What if I miss a dose? If you miss a dose, take it as soon as you can. If it is almost time for your next dose, take only that dose. Do not take double or extra doses. What may interact with this medicine? Do not take this medicine with any of the following medications: -medicines used to treat erectile dysfunction (ED) like avanafil, sildenafil, tadalafil, and vardenafil -riociguat This medicine may also interact with the following medications: -medicines for high blood pressure -other medicines for angina or heart failure This list may not describe all possible interactions. Give your health care provider a list of all the medicines, herbs, non-prescription drugs, or dietary supplements you use. Also tell them if you smoke, drink alcohol, or use illegal drugs. Some items may interact with your medicine. What should I watch for while using this medicine? Check your heart rate and blood pressure regularly while you are taking this medicine. Ask your doctor or health care professional what your heart rate and blood pressure should be and when you should contact him or her. Tell your doctor or health care professional if you feel your medicine is no longer working. You may get dizzy. Do not drive, use machinery, or do anything that needs mental alertness until you know how this medicine affects you. To reduce the risk of  dizzy or fainting spells, do not sit or stand up quickly, especially if you are an older patient. Alcohol can make you more dizzy, and increase flushing and rapid heartbeats. Avoid alcoholic drinks. Do not treat yourself for coughs, colds, or pain while you are taking this medicine without asking your doctor or health care professional for advice. Some ingredients may increase your blood pressure. What side effects may I notice from receiving this medicine? Side effects that you should report to your doctor or health care professional as soon as possible: -bluish discoloration of lips, fingernails, or palms of hands -irregular heartbeat, palpitations -low blood pressure -nausea, vomiting -persistent headache -unusually weak or tired Side effects that usually do not require medical attention (report to your doctor or health care professional if they continue or are bothersome): -flushing of the face or neck -rash This list may not describe all possible side effects. Call your doctor for medical advice about  side effects. You may report side effects to FDA at 1-800-FDA-1088. Where should I keep my medicine? Keep out of the reach of children. Store between 15 and 30 degrees C (59 and 86 degrees F). Keep container tightly closed. Throw away any unused medicine after the expiration date. NOTE: This sheet is a summary. It may not cover all possible information. If you have questions about this medicine, talk to your doctor, pharmacist, or health care provider.  2018 Elsevier/Gold Standard (2013-06-06 14:48:19) Carvedilol tablets What is this medicine? CARVEDILOL (KAR ve dil ol) is a beta-blocker. Beta-blockers reduce the workload on the heart and help it to beat more regularly. This medicine is used to treat high blood pressure and heart failure. This medicine may be used for other purposes; ask your health care provider or pharmacist if you have questions. COMMON BRAND NAME(S): Coreg What should  I tell my health care provider before I take this medicine? They need to know if you have any of these conditions: -circulation problems -diabetes -history of heart attack or heart disease -liver disease -lung or breathing disease, like asthma or emphysema -pheochromocytoma -slow or irregular heartbeat -thyroid disease -an unusual or allergic reaction to carvedilol, other beta-blockers, medicines, foods, dyes, or preservatives -pregnant or trying to get pregnant -breast-feeding How should I use this medicine? Take this medicine by mouth with a glass of water. Follow the directions on the prescription label. It is best to take the tablets with food. Take your doses at regular intervals. Do not take your medicine more often than directed. Do not stop taking except on the advice of your doctor or health care professional. Talk to your pediatrician regarding the use of this medicine in children. Special care may be needed. Overdosage: If you think you have taken too much of this medicine contact a poison control center or emergency room at once. NOTE: This medicine is only for you. Do not share this medicine with others. What if I miss a dose? If you miss a dose, take it as soon as you can. If it is almost time for your next dose, take only that dose. Do not take double or extra doses. What may interact with this medicine? This medicine may interact with the following medications: -certain medicines for blood pressure, heart disease, irregular heart beat -certain medicines for depression, like fluoxetine or paroxetine -certain medicines for diabetes, like glipizide or glyburide -cimetidine -clonidine -cyclosporine -digoxin -MAOIs like Carbex, Eldepryl, Marplan, Nardil, and Parnate -reserpine -rifampin This list may not describe all possible interactions. Give your health care provider a list of all the medicines, herbs, non-prescription drugs, or dietary supplements you use. Also tell  them if you smoke, drink alcohol, or use illegal drugs. Some items may interact with your medicine. What should I watch for while using this medicine? Check your heart rate and blood pressure regularly while you are taking this medicine. Ask your doctor or health care professional what your heart rate and blood pressure should be, and when you should contact him or her. Do not stop taking this medicine suddenly. This could lead to serious heart-related effects. Contact your doctor or health care professional if you have difficulty breathing while taking this drug. Check your weight daily. Ask your doctor or health care professional when you should notify him/her of any weight gain. You may get drowsy or dizzy. Do not drive, use machinery, or do anything that requires mental alertness until you know how this medicine affects you. To reduce the  risk of dizzy or fainting spells, do not sit or stand up quickly. Alcohol can make you more drowsy, and increase flushing and rapid heartbeats. Avoid alcoholic drinks. If you have diabetes, check your blood sugar as directed. Tell your doctor if you have changes in your blood sugar while you are taking this medicine. If you are going to have surgery, tell your doctor or health care professional that you are taking this medicine. What side effects may I notice from receiving this medicine? Side effects that you should report to your doctor or health care professional as soon as possible: -allergic reactions like skin rash, itching or hives, swelling of the face, lips, or tongue -breathing problems -dark urine -irregular heartbeat -swollen legs or ankles -vomiting -yellowing of the eyes or skin Side effects that usually do not require medical attention (report to your doctor or health care professional if they continue or are bothersome): -change in sex drive or performance -diarrhea -dry eyes (especially if wearing contact lenses) -dry, itching  skin -headache -nausea -unusually tired This list may not describe all possible side effects. Call your doctor for medical advice about side effects. You may report side effects to FDA at 1-800-FDA-1088. Where should I keep my medicine? Keep out of the reach of children. Store at room temperature below 30 degrees C (86 degrees F). Protect from moisture. Keep container tightly closed. Throw away any unused medicine after the expiration date. NOTE: This sheet is a summary. It may not cover all possible information. If you have questions about this medicine, talk to your doctor, pharmacist, or health care provider.  2018 Elsevier/Gold Standard (2013-04-13 14:12:02) Atorvastatin tablets What is this medicine? ATORVASTATIN (a TORE va sta tin) is known as a HMG-CoA reductase inhibitor or 'statin'. It lowers the level of cholesterol and triglycerides in the blood. This drug may also reduce the risk of heart attack, stroke, or other health problems in patients with risk factors for heart disease. Diet and lifestyle changes are often used with this drug. This medicine may be used for other purposes; ask your health care provider or pharmacist if you have questions. COMMON BRAND NAME(S): Lipitor What should I tell my health care provider before I take this medicine? They need to know if you have any of these conditions: -frequently drink alcoholic beverages -history of stroke, TIA -kidney disease -liver disease -muscle aches or weakness -other medical condition -an unusual or allergic reaction to atorvastatin, other medicines, foods, dyes, or preservatives -pregnant or trying to get pregnant -breast-feeding How should I use this medicine? Take this medicine by mouth with a glass of water. Follow the directions on the prescription label. You can take this medicine with or without food. Take your doses at regular intervals. Do not take your medicine more often than directed. Talk to your  pediatrician regarding the use of this medicine in children. While this drug may be prescribed for children as young as 60 years old for selected conditions, precautions do apply. Overdosage: If you think you have taken too much of this medicine contact a poison control center or emergency room at once. NOTE: This medicine is only for you. Do not share this medicine with others. What if I miss a dose? If you miss a dose, take it as soon as you can. If it is almost time for your next dose, take only that dose. Do not take double or extra doses. What may interact with this medicine? Do not take this medicine with any of  the following medications: -red yeast rice -telaprevir -telithromycin -voriconazole This medicine may also interact with the following medications: -alcohol -antiviral medicines for HIV or AIDS -boceprevir -certain antibiotics like clarithromycin, erythromycin, troleandomycin -certain medicines for cholesterol like fenofibrate or gemfibrozil -cimetidine -clarithromycin -colchicine -cyclosporine -digoxin -male hormones, like estrogens or progestins and birth control pills -grapefruit juice -medicines for fungal infections like fluconazole, itraconazole, ketoconazole -niacin -rifampin -spironolactone This list may not describe all possible interactions. Give your health care provider a list of all the medicines, herbs, non-prescription drugs, or dietary supplements you use. Also tell them if you smoke, drink alcohol, or use illegal drugs. Some items may interact with your medicine. What should I watch for while using this medicine? Visit your doctor or health care professional for regular check-ups. You may need regular tests to make sure your liver is working properly. Tell your doctor or health care professional right away if you get any unexplained muscle pain, tenderness, or weakness, especially if you also have a fever and tiredness. Your doctor or health care  professional may tell you to stop taking this medicine if you develop muscle problems. If your muscle problems do not go away after stopping this medicine, contact your health care professional. This drug is only part of a total heart-health program. Your doctor or a dietician can suggest a low-cholesterol and low-fat diet to help. Avoid alcohol and smoking, and keep a proper exercise schedule. Do not use this drug if you are pregnant or breast-feeding. Serious side effects to an unborn child or to an infant are possible. Talk to your doctor or pharmacist for more information. This medicine may affect blood sugar levels. If you have diabetes, check with your doctor or health care professional before you change your diet or the dose of your diabetic medicine. If you are going to have surgery tell your health care professional that you are taking this drug. What side effects may I notice from receiving this medicine? Side effects that you should report to your doctor or health care professional as soon as possible: -allergic reactions like skin rash, itching or hives, swelling of the face, lips, or tongue -dark urine -fever -joint pain -muscle cramps, pain -redness, blistering, peeling or loosening of the skin, including inside the mouth -trouble passing urine or change in the amount of urine -unusually weak or tired -yellowing of eyes or skin Side effects that usually do not require medical attention (report to your doctor or health care professional if they continue or are bothersome): -constipation -heartburn -stomach gas, pain, upset This list may not describe all possible side effects. Call your doctor for medical advice about side effects. You may report side effects to FDA at 1-800-FDA-1088. Where should I keep my medicine? Keep out of the reach of children. Store at room temperature between 20 to 25 degrees C (68 to 77 degrees F). Throw away any unused medicine after the expiration  date. NOTE: This sheet is a summary. It may not cover all possible information. If you have questions about this medicine, talk to your doctor, pharmacist, or health care provider.  2018 Elsevier/Gold Standard (2011-06-27 35:36:14) Aspirin and Your Heart Aspirin is a medicine that affects the way blood clots. Aspirin can be used to help reduce the risk of blood clots, heart attacks, and other heart-related problems. Should I take aspirin? Your health care provider will help you determine whether it is safe and beneficial for you to take aspirin daily. Taking aspirin daily may be beneficial if  you:  Have had a heart attack or chest pain.  Have undergone open heart surgery such as coronary artery bypass surgery (CABG).  Have had coronary angioplasty.  Have experienced a stroke or transient ischemic attack (TIA).  Have peripheral vascular disease (PVD).  Have chronic heart rhythm problems such as atrial fibrillation.  Are there any risks of taking aspirin daily? Daily use of aspirin can increase your risk of side effects. Some of these include:  Bleeding. Bleeding problems can be minor or serious. An example of a minor problem is a cut that does not stop bleeding. An example of a more serious problem is stomach bleeding or bleeding into the brain. Your risk of bleeding is increased if you are also taking non-steroidal anti-inflammatory medicine (NSAIDs).  Increased bruising.  Upset stomach.  An allergic reaction. People who have nasal polyps have an increased risk of developing an aspirin allergy.  What are some guidelines I should follow when taking aspirin?  Take aspirin only as directed by your health care provider. Make sure you understand how much you should take and what form you should take. The two forms of aspirin are: ? Non-enteric-coated. This type of aspirin does not have a coating and is absorbed quickly. Non-enteric-coated aspirin is usually recommended for people with  chest pain. This type of aspirin also comes in a chewable form. ? Enteric-coated. This type of aspirin has a special coating that releases the medicine very slowly. Enteric-coated aspirin causes less stomach upset than non-enteric-coated aspirin. This type of aspirin should not be chewed or crushed.  Drink alcohol in moderation. Drinking alcohol increases your risk of bleeding. When should I seek medical care?  You have unusual bleeding or bruising.  You have stomach pain.  You have an allergic reaction. Symptoms of an allergic reaction include: ? Hives. ? Itchy skin. ? Swelling of the lips, tongue, or face.  You have ringing in your ears. When should I seek immediate medical care?  Your bowel movements are bloody, dark red, or black in color.  You vomit or cough up blood.  You have blood in your urine.  You cough, wheeze, or feel short of breath. If you have any of the following symptoms, this is an emergency. Do not wait to see if the pain will go away. Get medical help at once. Call your local emergency services (911 in the [place] Do not drive yourself to the hospital.  You have severe chest pain, especially if the pain is crushing or pressure-like and spreads to the arms, back, neck, or jaw.  You have stroke-like symptoms, such as: ? Loss of vision. ? Difficulty talking. ? Numbness or weakness on one side of your body. ? Numbness or weakness in your arm or leg. ? Not thinking clearly or feeling confused.  This information is not intended to replace advice given to you by your health care provider. Make sure you discuss any questions you have with your health care provider. Document Released: 07/20/2008 Document Revised: 12/15/2015 Document Reviewed: 11/12/2013 Elsevier Interactive Patient Education  2018 [employer]. Steps to Quit Smoking Smoking tobacco can be bad for your health. It can also affect almost every organ in your body. Smoking puts you and people around you  at risk for many serious long-lasting (chronic) diseases. Quitting smoking is hard, but it is one of the best things that you can do for your health. It is never too late to quit. What are the benefits of quitting smoking? When you  quit smoking, you lower your risk for getting serious diseases and conditions. They can include:  Lung cancer or lung disease.  Heart disease.  Stroke.  Heart attack.  Not being able to have children (infertility).  Weak bones (osteoporosis) and broken bones (fractures).  If you have coughing, wheezing, and shortness of breath, those symptoms may get better when you quit. You may also get sick less often. If you are pregnant, quitting smoking can help to lower your chances of having a baby of low birth weight. What can I do to help me quit smoking? Talk with your doctor about what can help you quit smoking. Some things you can do (strategies) include:  Quitting smoking totally, instead of slowly cutting back how much you smoke over a period of time.  Going to in-person counseling. You are more likely to quit if you go to many counseling sessions.  Using resources and support systems, such as: ? Database administrator with a Social worker. ? Phone quitlines. ? Careers information officer. ? Support groups or group counseling. ? Text messaging programs. ? Mobile phone apps or applications.  Taking medicines. Some of these medicines may have nicotine in them. If you are pregnant or breastfeeding, do not take any medicines to quit smoking unless your doctor says it is okay. Talk with your doctor about counseling or other things that can help you.  Talk with your doctor about using more than one strategy at the same time, such as taking medicines while you are also going to in-person counseling. This can help make quitting easier. What things can I do to make it easier to quit? Quitting smoking might feel very hard at first, but there is a lot that you can do to make it  easier. Take these steps:  Talk to your family and friends. Ask them to support and encourage you.  Call phone quitlines, reach out to support groups, or work with a Social worker.  Ask people who smoke to not smoke around you.  Avoid places that make you want (trigger) to smoke, such as: ? Bars. ? Parties. ? Smoke-break areas at work.  Spend time with people who do not smoke.  Lower the stress in your life. Stress can make you want to smoke. Try these things to help your stress: ? Getting regular exercise. ? Deep-breathing exercises. ? Yoga. ? Meditating. ? Doing a body scan. To do this, close your eyes, focus on one area of your body at a time from head to toe, and notice which parts of your body are tense. Try to relax the muscles in those areas.  Download or buy apps on your mobile phone or tablet that can help you stick to your quit plan. There are many free apps, such as QuitGuide from the [employer] Office manager for Disease Control and Prevention). You can find more support from smokefree.gov and other websites.  This information is not intended to replace advice given to you by your health care provider. Make sure you discuss any questions you have with your health care provider. Document Released: 06/03/2009 Document Revised: 04/04/2016 Document Reviewed: 12/22/2014 Elsevier Interactive Patient Education  2018 [place] Heart-healthy meal planning includes:  Limiting unhealthy fats.  Increasing healthy fats.  Making other small dietary changes.  You may need to talk with your doctor or a diet specialist (dietitian) to create an eating plan that is right for you. What types of fat should I choose?  Choose healthy fats. These include olive  oil and canola oil, flaxseeds, walnuts, almonds, and seeds.  Eat more omega-3 fats. These include salmon, mackerel, sardines, tuna, flaxseed oil, and ground flaxseeds. Try to eat fish at least twice each  week.  Limit saturated fats. ? Saturated fats are often found in animal products, such as meats, butter, and cream. ? Plant sources of saturated fats include palm oil, palm kernel oil, and coconut oil.  Avoid foods with partially hydrogenated oils in them. These include stick margarine, some tub margarines, cookies, crackers, and other baked goods. These contain trans fats. What general guidelines do I need to follow?  Check food labels carefully. Identify foods with trans fats or high amounts of saturated fat.  Fill one half of your plate with vegetables and green salads. Eat 4-5 servings of vegetables per day. A serving of vegetables is: ? 1 cup of raw leafy vegetables. ?  cup of raw or cooked cut-up vegetables. ?  cup of vegetable juice.  Fill one fourth of your plate with whole grains. Look for the word "whole" as the first word in the ingredient list.  Fill one fourth of your plate with lean protein foods.  Eat 4-5 servings of fruit per day. A serving of fruit is: ? One medium whole fruit. ?  cup of dried fruit. ?  cup of fresh, frozen, or canned fruit. ?  cup of 100% fruit juice.  Eat more foods that contain soluble fiber. These include apples, broccoli, carrots, beans, peas, and barley. Try to get 20-30 g of fiber per day.  Eat more home-cooked food. Eat less restaurant, buffet, and fast food.  Limit or avoid alcohol.  Limit foods high in starch and sugar.  Avoid fried foods.  Avoid frying your food. Try baking, boiling, grilling, or broiling it instead. You can also reduce fat by: ? Removing the skin from poultry. ? Removing all visible fats from meats. ? Skimming the fat off of stews, soups, and gravies before serving them. ? Steaming vegetables in water or broth.  Lose weight if you are overweight.  Eat 4-5 servings of nuts, legumes, and seeds per week: ? One serving of dried beans or legumes equals  cup after being cooked. ? One serving of nuts equals 1  ounces. ? One serving of seeds equals  ounce or one tablespoon.  You may need to keep track of how much salt or sodium you eat. This is especially true if you have high blood pressure. Talk with your doctor or dietitian to get more information. What foods can I eat? Grains Breads, including [place], white, pita, wheat, raisin, rye, oatmeal, and [place]. Tortillas that are neither fried nor made with lard or trans fat. Low-fat rolls, including hotdog and hamburger buns and English muffins. Biscuits. Muffins. Waffles. Pancakes. Light popcorn. Whole-grain cereals. Flatbread. Melba toast. Pretzels. Breadsticks. Rusks. Low-fat snacks. Low-fat crackers, including oyster, saltine, matzo, graham, animal, and rye. Rice and pasta, including brown rice and pastas that are made with whole wheat. Vegetables All vegetables. Fruits All fruits, but limit coconut. Meats and Other Protein Sources Lean, well-trimmed beef, veal, pork, and lamb. Chicken and [place] without skin. All fish and shellfish. Wild duck, rabbit, pheasant, and venison. Egg whites or low-cholesterol egg substitutes. Dried beans, peas, lentils, and tofu. Seeds and most nuts. Dairy Low-fat or nonfat cheeses, including ricotta, string, and mozzarella. Skim or 1% milk that is liquid, powdered, or evaporated. Buttermilk that is made with low-fat milk. Nonfat or low-fat yogurt. Beverages Mineral water. Diet carbonated  beverages. Sweets and Desserts Sherbets and fruit ices. Honey, jam, marmalade, jelly, and syrups. Meringues and gelatins. Pure sugar candy, such as hard candy, jelly beans, gumdrops, mints, marshmallows, and small amounts of dark chocolate. [employer] Eat all sweets and desserts in moderation. Fats and Oils Nonhydrogenated (trans-free) margarines. Vegetable oils, including soybean, sesame, sunflower, olive, peanut, safflower, corn, canola, and cottonseed. Salad dressings or mayonnaise made with a vegetable oil. Limit added fats  and oils that you use for cooking, baking, salads, and as spreads. Other Cocoa powder. Coffee and tea. All seasonings and condiments. The items listed above may not be a complete list of recommended foods or beverages. Contact your dietitian for more options. What foods are not recommended? Grains Breads that are made with saturated or trans fats, oils, or whole milk. Croissants. Butter rolls. Cheese breads. Sweet rolls. Donuts. Buttered popcorn. Chow mein noodles. High-fat crackers, such as cheese or butter crackers. Meats and Other Protein Sources Fatty meats, such as hotdogs, short ribs, sausage, spareribs, bacon, rib eye roast or steak, and mutton. High-fat deli meats, such as salami and bologna. Caviar. Domestic duck and goose. Organ meats, such as kidney, liver, sweetbreads, and heart. Dairy Cream, sour cream, cream cheese, and creamed cottage cheese. Whole-milk cheeses, including blue (bleu), Monte[place] Jack, [place], [place], American, [place], Swiss, cheddar, [place], and [place]. Whole or 2% milk that is liquid, evaporated, or condensed. Whole buttermilk. Cream sauce or high-fat cheese sauce. Yogurt that is made from whole milk. Beverages Regular sodas and juice drinks with added sugar. Sweets and Desserts Frosting. Pudding. Cookies. Cakes other than angel food cake. Candy that has milk chocolate or white chocolate, hydrogenated fat, butter, coconut, or unknown ingredients. Buttered syrups. Full-fat ice cream or ice cream drinks. Fats and Oils Gravy that has suet, meat fat, or shortening. Cocoa butter, hydrogenated oils, palm oil, coconut oil, palm kernel oil. These can often be found in baked products, candy, fried foods, nondairy creamers, and whipped toppings. Solid fats and shortenings, including bacon fat, salt pork, lard, and butter. Nondairy cream substitutes, such as coffee creamers and sour cream substitutes. Salad dressings that are made of unknown oils, cheese, or sour cream. The  items listed above may not be a complete list of foods and beverages to avoid. Contact your dietitian for more information. This information is not intended to replace advice given to you by your health care provider. Make sure you discuss any questions you have with your health care provider. Document Released: 02/06/2012 Document Revised: 01/13/2016 Document Reviewed: 01/29/2014 Elsevier Interactive Patient Education  2018 [place] healthy diet. Smoking cessation. Cardiac rehab

## 2017-07-23 NOTE — Consult Note (Signed)
Provided patient with "Living Better with Heart Failure" packet. Briefly reviewed definition of heart failure and signs and symptoms of an exacerbation. Reviewed importance of and reason behind checking weight daily in the AM, after using the bathroom, but before getting dressed. Discussed when to call the Dr= weight gain of >2lb overnight of 5lb in a week,  Discussed yellow zone= call MD: weight gain of >2lb overnight of 5lb in a week, increased swelling, increased SOB when lying down, chest discomfort, dizziness, increased fatigue Red Zone= call 911: struggle to breath, fainting or near fainting, significant chest pain Reviewed low sodium diet <2g/day-provided handout of recommended and not recommended foods  Fluid restriction <2L/day Reviewed how to read nutrition label Reviewed medication changes:carvedilol, imdur, losartan Explained briefly why pt is on the medications (either make you feel better, live longer or keep you out of the hospital) and discussed monitoring and side effects Discussed tobacco cessation: pt does  Discussed exercise: encourage pt be active and work up to exercise of at least 81min several times a week  Also stressed importance of pt restarting metformin and getting blood sugars under control. Suggested that he start with 1/2 tab once or twice a day for about a week and then increase to 1 tab BID to reduce potential side effects  Lawrence Holmes Lawrence Holmes, Pharm.Lawrence, BCPS Clinical Pharmacist

## 2017-07-23 NOTE — Discharge Summary (Signed)
[place] [facility] [place] NAME: Lawrence Holmes    MR#:  [id number]  DATE OF BIRTH:  03/31/1956  DATE OF ADMISSION:  07/21/2017   ADMITTING PHYSICIAN: Wellington Hampshire, MD  DATE OF DISCHARGE: 07/23/2017 PRIMARY CARE PHYSICIAN: Patient, No Pcp Per   ADMISSION DIAGNOSIS:  Acute ST elevation myocardial infarction (STEMI) involving other coronary artery of inferior wall (HCC) [I21.19] DISCHARGE DIAGNOSIS:  Principal Problem:   Acute ST elevation myocardial infarction (STEMI) of inferior wall (HCC) Active Problems:   Diabetes (HCC)   Acute systolic CHF (congestive heart failure) ([place])  SECONDARY DIAGNOSIS:   Past Medical History:  Diagnosis Date  . Diabetes mellitus without complication [facility])    HOSPITAL COURSE:   Acute ST elevation myocardial infarction (STEMI) of inferior wall s/p catheterization, RCA lesion which was not amenable to stenting. Maximum medical management. Cardiology following. Continue aspirin, Plavix, Lipitor, Coreg, imdur and losartan.  Acute respiratory failure with hypoxia due to acute systolic CHF andIschemic cardiomyopathy: EF 25-30% by LV-gram, ischemic cardiomyopathy. Try to wean off oxygen. Continue imdur, Coreg and losartan per Dr. Aundra Dubin.  Hyponatremia.  Follow-up sodium level as outpatient.  Diabetes ([place]) -sliding scale insulin with lantus. Resume home metformin 1000 mg bid. He was on this dose 6 month ago, which was stopped due to low blood glucose. But since BS is up and AC1 is 11.8, resume after discharge.  Follow-up with PCP for adjustment medication.  Tobacco abuse.  Smoking cessation was counseled for 3-4 minutes.  The patient wants to quit.  I discussed with Dr. Fletcher Anon. DISCHARGE CONDITIONS:  Stable, discharged home today. CONSULTS OBTAINED:  Treatment Team:  Wellington Hampshire, MD DRUG ALLERGIES:  No Known Allergies DISCHARGE MEDICATIONS:   Allergies as of 07/23/2017   No Known Allergies     Medication List    TAKE these medications   aspirin 81 MG chewable tablet Chew 1 tablet (81 mg total) by mouth daily. Start taking on:  07/24/2017   atorvastatin 80 MG tablet Commonly known as:  LIPITOR Take 1 tablet (80 mg total) by mouth daily at 6 PM.   carvedilol 3.125 MG tablet Commonly known as:  COREG Take 1 tablet (3.125 mg total) by mouth 2 (two) times daily with a meal.   clopidogrel 75 MG tablet Commonly known as:  PLAVIX Take 1 tablet (75 mg total) by mouth daily with breakfast. Start taking on:  07/24/2017   isosorbide mononitrate 30 MG 24 hr tablet Commonly known as:  IMDUR Take 1 tablet (30 mg total) by mouth daily. Start taking on:  07/24/2017   losartan 25 MG tablet Commonly known as:  COZAAR Take 0.5 tablets (12.5 mg total) by mouth daily. Start taking on:  07/24/2017   metFORMIN 1000 MG tablet Commonly known as:  GLUCOPHAGE Take 1 tablet (1,000 mg total) by mouth 2 (two) times daily with a meal.   nitroGLYCERIN 0.4 MG SL tablet Commonly known as:  NITROSTAT Place 1 tablet (0.4 mg total) under the tongue every 5 (five) minutes as needed for chest pain.        DISCHARGE INSTRUCTIONS:  See AVS.  If you experience worsening of your admission symptoms, develop shortness of breath, life threatening emergency, suicidal or homicidal thoughts you must seek medical attention immediately by calling 911 or calling your MD immediately  if symptoms less severe.  You Must read complete instructions/literature along with all the possible adverse reactions/side effects for all the Medicines you take and  that have been prescribed to you. Take any new Medicines after you have completely understood and accpet all the possible adverse reactions/side effects.   Please note  You were cared for by a hospitalist during your hospital stay. If you have any questions about your discharge medications or the care you received while you were in the hospital after you are  discharged, you can call the unit and asked to speak with the hospitalist on call if the hospitalist that took care of you is not available. Once you are discharged, your primary care physician will handle any further medical issues. Please note that NO REFILLS for any discharge medications will be authorized once you are discharged, as it is imperative that you return to your primary care physician (or establish a relationship with a primary care physician if you do not have one) for your aftercare needs so that they can reassess your need for medications and monitor your lab values.    On the day of Discharge:  VITAL SIGNS:  Blood pressure 116/62, pulse 89, temperature 98.1 F (36.7 C), temperature source Oral, resp. rate 15, height 6' (1.829 m), weight 220 lb (99.8 kg), SpO2 92 %. PHYSICAL EXAMINATION:  GENERAL:  61 y.o.-year-old patient lying in the bed with no acute distress.  EYES: Pupils equal, round, reactive to light and accommodation. No scleral icterus. Extraocular muscles intact.  HEENT: Head atraumatic, normocephalic. Oropharynx and nasopharynx clear.  NECK:  Supple, no jugular venous distention. No thyroid enlargement, no tenderness.  LUNGS: Normal breath sounds bilaterally, no wheezing, rales,rhonchi or crepitation. No use of accessory muscles of respiration.  CARDIOVASCULAR: S1, S2 normal. No murmurs, rubs, or gallops.  ABDOMEN: Soft, non-tender, non-distended. Bowel sounds present. No organomegaly or mass.  EXTREMITIES: No pedal edema, cyanosis, or clubbing.  NEUROLOGIC: Cranial nerves II through XII are intact. Muscle strength 5/5 in all extremities. Sensation intact. Gait not checked.  PSYCHIATRIC: The patient is alert and oriented x 3.  SKIN: No obvious rash, lesion, or ulcer.  DATA REVIEW:   CBC Recent Labs  Lab 07/23/17 0441  WBC 10.4  HGB 15.0  HCT 43.8  PLT 282    Chemistries  Recent Labs  Lab 07/21/17 0339 07/22/17 0209  NA 133* 130*  K 3.7 3.7  CL 100*  98*  CO2 23 23  GLUCOSE 285* 220*  BUN 14 19  CREATININE 0.84 0.95  CALCIUM 8.9 8.8*  MG 2.0  --      Microbiology Results  Results for orders placed or performed during the hospital encounter of 07/21/17  MRSA PCR Screening     Status: None   Collection Time: 07/21/17  2:12 AM  Result Value Ref Range Status   MRSA by PCR NEGATIVE NEGATIVE Final    Comment:        The GeneXpert MRSA Assay (FDA approved for NASAL specimens only), is one component of a comprehensive MRSA colonization surveillance program. It is not intended to diagnose MRSA infection nor to guide or monitor treatment for MRSA infections.     RADIOLOGY:  No results found.   Management plans discussed with the patient, family and they are in agreement.  CODE STATUS: Full Code   TOTAL TIME TAKING CARE OF THIS PATIENT: 33 minutes.    Demetrios Loll M.D on 07/23/2017 at 3:30 PM  Between 7am to 6pm - Pager - [phone]  After 6pm go to [email or web address] - [facility]  Office  [phone]  CC: Primary  care physician; Patient, No Pcp Per   Note: This dictation was prepared with Dragon dictation along with smaller phrase technology. Any transcriptional errors that result from this process are unintentional.

## 2017-07-23 NOTE — Plan of Care (Signed)
  Progressing Education: Knowledge of General Education information will improve 07/23/2017 1552 - Progressing by Gildardo Pounds, RN Health Behavior/Discharge Planning: Ability to manage health-related needs will improve 07/23/2017 1552 - Progressing by Gildardo Pounds, RN Clinical Measurements: Ability to maintain clinical measurements within normal limits will improve 07/23/2017 1552 - Progressing by Gildardo Pounds, RN Will remain free from infection 07/23/2017 1552 - Progressing by Gildardo Pounds, RN Diagnostic test results will improve 07/23/2017 1552 - Progressing by Gildardo Pounds, RN Respiratory complications will improve 07/23/2017 1552 - Progressing by Gildardo Pounds, RN Cardiovascular complication will be avoided 07/23/2017 1552 - Progressing by Gildardo Pounds, RN Activity: Risk for activity intolerance will decrease 07/23/2017 1552 - Progressing by Gildardo Pounds, RN Nutrition: Adequate nutrition will be maintained 07/23/2017 1552 - Progressing by Gildardo Pounds, RN Coping: Level of anxiety will decrease 07/23/2017 1552 - Progressing by Gildardo Pounds, RN Elimination: Will not experience complications related to bowel motility 07/23/2017 1552 - Progressing by Gildardo Pounds, RN Will not experience complications related to urinary retention 07/23/2017 1552 - Progressing by Gildardo Pounds, RN Pain Managment: General experience of comfort will improve 07/23/2017 1552 - Progressing by Gildardo Pounds, RN Safety: Ability to remain free from injury will improve 07/23/2017 1552 - Progressing by Gildardo Pounds, RN Skin Integrity: Risk for impaired skin integrity will decrease 07/23/2017 1552 - Progressing by Gildardo Pounds, RN Education: Understanding of cardiac disease, CV risk reduction, and recovery process will improve 07/23/2017 1552 - Progressing by Gildardo Pounds, RN Understanding of medication regimen will improve 07/23/2017 1552 - Progressing by  Gildardo Pounds, RN Cardiac: Ability to achieve and maintain adequate cardiopulmonary perfusion will improve 07/23/2017 1552 - Progressing by Gildardo Pounds, RN Vascular access site(s) Level 0-1 will be maintained 07/23/2017 1552 - Progressing by Gildardo Pounds, RN Health Behavior/Discharge Planning: Ability to safely manage health-related needs after discharge will improve 07/23/2017 1552 - Progressing by Gildardo Pounds, RN

## 2017-07-23 NOTE — Progress Notes (Signed)
Patient ID: Lawrence Holmes, male   DOB: Dec 13, 1955, 61 y.o.   MRN: [id number]   Progress Note  Patient Name: Lawrence Holmes Date of Encounter: 07/23/2017  Primary Cardiologist: Fletcher Anon  Subjective   He had mild chest pain yesterday but he feels well today.  No shortness of breath.   Inpatient Medications    Scheduled Meds: . aspirin  81 mg Oral Daily  . atorvastatin  80 mg Oral q1800  . carvedilol  3.125 mg Oral BID WC  . clopidogrel  75 mg Oral Q breakfast  . insulin aspart  0-20 Units Subcutaneous TID WC  . insulin aspart  0-5 Units Subcutaneous QHS  . insulin aspart  4 Units Subcutaneous TID WC  . insulin glargine  10 Units Subcutaneous Daily  . insulin starter kit- pen needles  1 kit Other Once  . [START ON 07/24/2017] isosorbide mononitrate  30 mg Oral Daily  . losartan  12.5 mg Oral Daily  . polyethylene glycol  17 g Oral Daily  . sodium chloride flush  3 mL Intravenous Q12H   Continuous Infusions: . sodium chloride     PRN Meds: sodium chloride, acetaminophen, morphine injection, nitroGLYCERIN, ondansetron (ZOFRAN) IV, sodium chloride flush   Vital Signs    Vitals:   07/22/17 1733 07/22/17 2104 07/23/17 0411 07/23/17 0750  BP: 105/66 119/70 (!) 98/47 116/62  Pulse: 100  88 89  Resp:   18 15  Temp: 98 F (36.7 C) 98.1 F (36.7 C) 98.5 F (36.9 C) 98.1 F (36.7 C)  TempSrc: Oral Oral Oral Oral  SpO2: 96%  92%   Weight:      Height:        Intake/Output Summary (Last 24 hours) at 07/23/2017 1539 Last data filed at 07/23/2017 1345 Gross per 24 hour  Intake 937.91 ml  Output 950 ml  Net -12.09 ml   Filed Weights   07/20/17 2338  Weight: 220 lb (99.8 kg)    Telemetry    NSR - Personally Reviewed   Physical Exam   GEN: No acute distress.   Neck: No JVD Cardiac: RRR, no murmurs, rubs, or gallops.  Respiratory: Clear to auscultation bilaterally. GI: Soft, nontender, non-distended  MS: No edema; No deformity. Neuro:  Nonfocal  Psych: Normal affect    Right radial pulse is normal with no hematoma  Labs    Chemistry Recent Labs  Lab 07/20/17 2341 07/21/17 0339 07/22/17 0209  NA 131* 133* 130*  K 3.9 3.7 3.7  CL 96* 100* 98*  CO2 _0 GLUCOSE 401* 285* 220*  BUN _1 CREATININE 0.99 0.84 0.95  CALCIUM 8.8* 8.9 8.8*  GFRNONAA >60 >60 >60  GFRAA >60 >60 >60  ANIONGAP _2 Hematology Recent Labs  Lab 07/21/17 0339 07/22/17 0209 07/23/17 0441  WBC 10.4 11.9* 10.4  RBC 4.82 4.55 4.75  HGB 15.3 14.4 15.0  HCT 44.2 41.5 43.8  MCV 91.6 91.2 92.2  MCH 31.8 31.6 31.6  MCHC 34.7 34.7 34.3  RDW 12.7 12.8 12.9  PLT 229 248 282    Cardiac Enzymes Recent Labs  Lab 07/20/17 2341 07/21/17 0339 07/21/17 1132 07/21/17 1538  TROPONINI 4.98* 4.05* 3.16* 3.94*   No results for input(s): TROPIPOC in the last 168 hours.   BNPNo results for input(s): BNP, PROBNP in the last 168 hours.   DDimer No results for input(s): DDIMER in the last 168 hours.   Radiology  No results found.  Patient Profile     61 y.o. male with history of diabetes with late presenting inferior MI.  Unable to open RCA at PCI.   Assessment & Plan    1. CAD: Late-presenting inferior MI.  Troponin was already trending down on presentation.  On cath, pRCA and dLCx both occluded with left=>right collaterals.  Attempted PCI of RCA, unsuccessful.  Medical management.   - Continue ASA, Plavix, statin.  -Blood pressure is on the low side.  Thus, I decreased Imdur to 30 mg once daily - Continue Coreg 3.125 mg bid.  Recommend cardiac rehab.  2. Ischemic cardiomyopathy: EF 25-30% by LV-gram, ischemic cardiomyopathy.   I reviewed echocardiogram which showed an EF of 30-35%. Continue treatment with small dose carvedilol, losartan and spironolactone.  3. Diabetes: Per primary team.  The patient can be discharged home from a cardiac standpoint. He has a follow-up appointment scheduled with Ignacia Bayley in our office on December  11.   For questions or updates, please contact [place] Please consult [email or web address] for contact info under Cardiology/STEMI.      Signed, Kathlyn Sacramento, MD  07/23/2017, 3:39 PM

## 2017-07-23 NOTE — Telephone Encounter (Signed)
TCM....  Patient is being discharged later today  They saw Dr Fletcher Anon in hospital   They are scheduled to see Ignacia Bayley  on 07/31/17   They were seen for ST and Stemi fu   They need to be seen within 1 week  Pt was not added wait list   Please call

## 2017-07-24 NOTE — Telephone Encounter (Signed)
Patient returned call regarding discharge from [facility] on 12/3.  Patient understands to follow up with provider Sharolyn Douglas on 12/11 at 8:30am at [facility]. Patient understands discharge instructions? yes Patient understands medications and regiment? yes Patient understands to bring all medications to this visit? yes

## 2017-07-24 NOTE — Telephone Encounter (Signed)
Left message on machine for patient to contact the office.   

## 2017-07-31 ENCOUNTER — Ambulatory Visit: Payer: 59 | Admitting: Nurse Practitioner

## 2017-08-02 ENCOUNTER — Ambulatory Visit: Payer: 59 | Admitting: Cardiovascular Disease

## 2017-08-03 ENCOUNTER — Telehealth: Payer: Self-pay

## 2017-08-03 ENCOUNTER — Ambulatory Visit (INDEPENDENT_AMBULATORY_CARE_PROVIDER_SITE_OTHER): Payer: 59 | Admitting: Cardiovascular Disease

## 2017-08-03 ENCOUNTER — Encounter: Payer: Self-pay | Admitting: Cardiovascular Disease

## 2017-08-03 VITALS — BP 120/70 | HR 85 | Ht 72.0 in | Wt 207.5 lb

## 2017-08-03 DIAGNOSIS — I5021 Acute systolic (congestive) heart failure: Secondary | ICD-10-CM

## 2017-08-03 DIAGNOSIS — Z720 Tobacco use: Secondary | ICD-10-CM | POA: Diagnosis not present

## 2017-08-03 DIAGNOSIS — Z79899 Other long term (current) drug therapy: Secondary | ICD-10-CM

## 2017-08-03 DIAGNOSIS — I2119 ST elevation (STEMI) myocardial infarction involving other coronary artery of inferior wall: Secondary | ICD-10-CM | POA: Diagnosis not present

## 2017-08-03 DIAGNOSIS — E785 Hyperlipidemia, unspecified: Secondary | ICD-10-CM | POA: Diagnosis not present

## 2017-08-03 MED ORDER — SACUBITRIL-VALSARTAN 24-26 MG PO TABS: 1.0000 | ORAL_TABLET | Freq: Two times a day (BID) | ORAL | 3 refills | Status: DC

## 2017-08-03 NOTE — Telephone Encounter (Signed)
Prior auth for [employer] 24-26 submitted to [facility]. It must go to pharmacy review for approval.

## 2017-08-03 NOTE — Patient Instructions (Addendum)
Medication Instructions:  Your physician has recommended you make the following change in your medication:  1- STOP Imdur. 2- STOP Losartan. 3- START Entresto 24-26 mg (1 tablet) by mouth two times a day.   Labwork: Your physician recommends that you return for lab work in: [place] ([facility]).  - 08/10/17 - Please go to the [facility]. You will check in at the front desk to the right as you walk into the atrium. Valet Parking is offered if needed.    Testing/Procedures: none  Follow-Up: You have been referred to CARDIAC REHAB. Please call next week if you have not heard from them. See number below.  Your physician recommends that you schedule a follow-up appointment in: [place].     If you need a refill on your cardiac medications before your next appointment, please call your pharmacy.  Medication Samples have been provided to the patient.  Drug name: eNTRESTO       Strength: 24-26MG         Qty: 1 BOX  LOT: [id number]  Exp.Date: 04/2018

## 2017-08-03 NOTE — Progress Notes (Signed)
Cardiology Office Note   Date:  08/03/2017   ID:  Lawrence Holmes, DOB [birth year], MRN [id number]  PCP:  Patient, No Pcp Per  Cardiologist:   Kathlyn Sacramento, MD   Chief Complaint  Patient presents with  . other    Follow up from [facility]; chest pain. Meds reviewed by the pt. verbally. "doing well."       History of Present Illness: Lawrence Holmes is a 61 y.o. male who presents for a follow-up visit after recent hospitalization for late presenting inferior ST elevation myocardial infarction and ischemic cardiomyopathy. He presented with chest pain of at least 24 hours duration.  EKG showed evidence of inferior infarct with large Q waves.  I proceeded with cardiac catheterization which showed severe two-vessel coronary artery disease with total occlusion of the proximal right coronary artery with left-to-right collaterals and occluded distal left circumflex with collaterals.  No obstructive disease affecting the LAD.  The culprit was felt to be the right coronary artery.  Ejection fraction was 25-30% with inferior wall akinesis.  I probed the right coronary artery with a wire and was not able to cross easily and thus no further intervention was performed.  Echocardiogram showed an EF of 30-35% with mild to moderate mitral regurgitation.  The patient was treated medically. He has been doing very well since hospital discharge with no chest pain, shortness of breath or palpitations.  He resumed most of his activities with no significant limitations. He works as a Forensic scientist.  Obviously, he has not resumed work.  He has been taking his medications regularly.    Past Medical History:  Diagnosis Date  . Chronic systolic heart failure ([place]) 07/2017   Ejection fraction of 30% due to ischemic cardiomyopathy  . Coronary artery disease 07/2017   Late presenting inferior ST elevation myocardial infarction. cardiac catheterization showed severe two-vessel coronary artery disease with total  occlusion of the proximal right coronary artery with left-to-right collaterals and occluded distal left circumflex with collaterals.  No obstructive disease affecting the LAD.  The culprit was felt to be the right coronary artery.   . Diabetes mellitus without complication ([place])   . Hyperlipidemia   . Tobacco use     Past Surgical History:  Procedure Laterality Date  . EYE SURGERY    . LEFT HEART CATH AND CORONARY ANGIOGRAPHY N/A 07/21/2017   Procedure: LEFT HEART CATH AND CORONARY ANGIOGRAPHY;  Surgeon: Wellington Hampshire, MD;  Location: [facility];  Service: Cardiovascular;  Laterality: N/A;     Current Outpatient Medications  Medication Sig Dispense Refill  . aspirin 81 MG chewable tablet Chew 1 tablet (81 mg total) by mouth daily. 30 tablet 2  . atorvastatin (LIPITOR) 80 MG tablet Take 1 tablet (80 mg total) by mouth daily at 6 PM. 30 tablet 2  . carvedilol (COREG) 3.125 MG tablet Take 1 tablet (3.125 mg total) by mouth 2 (two) times daily with a meal. 30 tablet 2  . clopidogrel (PLAVIX) 75 MG tablet Take 1 tablet (75 mg total) by mouth daily with breakfast. 30 tablet 2  . metFORMIN (GLUCOPHAGE) 1000 MG tablet Take 1 tablet (1,000 mg total) by mouth 2 (two) times daily with a meal. 60 tablet 1  . nitroGLYCERIN (NITROSTAT) 0.4 MG SL tablet Place 1 tablet (0.4 mg total) under the tongue every 5 (five) minutes as needed for chest pain. 30 tablet 2  . sacubitril-valsartan (ENTRESTO) 24-26 MG Take 1 tablet by mouth 2 (  two) times daily. 180 tablet 3   No current facility-administered medications for this visit.     Allergies:   Patient has no known allergies.    Social History:  The patient  reports that he has been smoking.  He has been smoking about 0.25 packs per day. he has never used smokeless tobacco. He reports that he drinks alcohol. He reports that he does not use drugs.   Family History:  The patient's family history includes Heart disease in his mother; Hyperlipidemia  in his mother; Hypertension in his mother.    ROS:  Please see the history of present illness.   Otherwise, review of systems are positive for none.   All other systems are reviewed and negative.    PHYSICAL EXAM: VS:  BP 120/70 (BP Location: Left Arm, Patient Position: Sitting, Cuff Size: Normal)   Pulse 85   Ht 6' (1.829 m)   Wt 207 lb 8 oz (94.1 kg)   BMI 28.14 kg/m  , BMI Body mass index is 28.14 kg/m. GEN: Well nourished, well developed, in no acute distress  HEENT: normal  Neck: no JVD, carotid bruits, or masses Cardiac: RRR; no murmurs, rubs, or gallops,no edema  Respiratory:  clear to auscultation bilaterally, normal work of breathing GI: soft, nontender, nondistended, + BS MS: no deformity or atrophy  Skin: warm and dry, no rash Neuro:  Strength and sensation are intact Psych: euthymic mood, full affect Right radial pulse is normal with no hematoma  EKG:  EKG is ordered today. The ekg ordered today demonstrates normal sinus rhythm with minimal LVH, old inferior infarct.  Anterolateral T wave changes suggestive of ischemia.   Recent Labs: 07/21/2017: Magnesium 2.0 07/22/2017: BUN 19; Creatinine, Ser 0.95; Potassium 3.7; Sodium 130 07/23/2017: Hemoglobin 15.0; Platelets 282    Lipid Panel No results found for: CHOL, TRIG, HDL, CHOLHDL, VLDL, LDLCALC, LDLDIRECT    Wt Readings from Last 3 Encounters:  08/03/17 207 lb 8 oz (94.1 kg)  07/20/17 220 lb (99.8 kg)      No flowsheet data found.    ASSESSMENT AND PLAN:  1.  Recent inferior myocardial infarction: No revascularization was performed due to late presentation and asymptomatic status.  Continue medical therapy.  Dual antiplatelet therapy for at least one year.  I strongly advised him to attend cardiac rehab. 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 Given the lack of angina, I elected to discontinue Imdur to allow up titration of heart failure medications. I advised him to apply for short-term disability through his work.  2.  Chronic systolic heart failure due to ischemic cardiomyopathy: Ejection fraction was 30%.  He is currently New York Heart Association class II.  I elected to switch him from losartan to [facility].  I will consider adding small dose eplerenone upon follow-up.  Check basic metabolic profile in 1 week.  Continue small dose carvedilol.  3.  Tobacco use: He cut down but did not quit completely.  I strongly advised him to quit.  4.  Hyperlipidemia: Continue high-dose atorvastatin.  He will need follow-up lipid and liver profile in the near future.  5.  Diabetes mellitus: Currently on metformin.  I advised him to establish with a primary care physician to manage this.   Disposition:   FU with me in 1 month  Signed,  Kathlyn Sacramento,  MD  08/03/2017 4:09 PM    [facility]

## 2017-08-06 ENCOUNTER — Telehealth: Payer: Self-pay

## 2017-08-06 NOTE — Telephone Encounter (Signed)
Entresto approved by [employer] [facility] for 1 year. [place] #[id number]. Patient notified of this.

## 2017-08-07 ENCOUNTER — Telehealth: Payer: Self-pay | Admitting: Cardiovascular Disease

## 2017-08-07 ENCOUNTER — Other Ambulatory Visit: Payer: Self-pay | Admitting: *Deleted

## 2017-08-07 MED ORDER — CARVEDILOL 3.125 MG PO TABS: 3.1250 mg | ORAL_TABLET | Freq: Two times a day (BID) | ORAL | 1 refills | Status: DC

## 2017-08-07 NOTE — Telephone Encounter (Signed)
Requested Prescriptions   Signed Prescriptions Disp Refills  . carvedilol (COREG) 3.125 MG tablet 60 tablet 1    Sig: Take 1 tablet (3.125 mg total) by mouth 2 (two) times daily with a meal.    Authorizing Provider: Kathlyn Sacramento A    Ordering User: Britt Bottom

## 2017-08-07 NOTE — Telephone Encounter (Signed)
°*  STAT* If patient is at the pharmacy, call can be transferred to refill team.   1. Which medications need to be refilled? (please list name of each medication and dose if known)   Carvedilol 3.125 po BID   2. Which pharmacy/location (including street and city if local pharmacy) is medication to be sent to?  [place]   3. Do they need a 30 day or 90 day supply? [place]

## 2017-08-09 ENCOUNTER — Other Ambulatory Visit
Admission: RE | Admit: 2017-08-09 | Discharge: 2017-08-09 | Disposition: A | Discharge status: Home / Self Care | Payer: Commercial Managed Care - HMO | Source: Ambulatory Visit | Attending: Cardiovascular Disease | Admitting: Cardiovascular Disease

## 2017-08-09 ENCOUNTER — Encounter: Payer: Commercial Managed Care - HMO | Attending: Cardiovascular Disease | Admitting: *Deleted

## 2017-08-09 ENCOUNTER — Ambulatory Visit: Payer: 59 | Admitting: Nurse Practitioner

## 2017-08-09 ENCOUNTER — Encounter: Payer: Self-pay | Admitting: *Deleted

## 2017-08-09 VITALS — Ht 72.0 in | Wt 202.3 lb

## 2017-08-09 DIAGNOSIS — I5022 Chronic systolic (congestive) heart failure: Secondary | ICD-10-CM | POA: Diagnosis not present

## 2017-08-09 DIAGNOSIS — I2510 Atherosclerotic heart disease of native coronary artery without angina pectoris: Secondary | ICD-10-CM | POA: Insufficient documentation

## 2017-08-09 DIAGNOSIS — I213 ST elevation (STEMI) myocardial infarction of unspecified site: Secondary | ICD-10-CM | POA: Diagnosis not present

## 2017-08-09 DIAGNOSIS — E785 Hyperlipidemia, unspecified: Secondary | ICD-10-CM | POA: Insufficient documentation

## 2017-08-09 DIAGNOSIS — Z79899 Other long term (current) drug therapy: Secondary | ICD-10-CM | POA: Diagnosis not present

## 2017-08-09 DIAGNOSIS — F17210 Nicotine dependence, cigarettes, uncomplicated: Secondary | ICD-10-CM | POA: Diagnosis not present

## 2017-08-09 DIAGNOSIS — E119 Type 2 diabetes mellitus without complications: Secondary | ICD-10-CM | POA: Insufficient documentation

## 2017-08-09 DIAGNOSIS — I5021 Acute systolic (congestive) heart failure: Secondary | ICD-10-CM | POA: Diagnosis present

## 2017-08-09 DIAGNOSIS — I255 Ischemic cardiomyopathy: Secondary | ICD-10-CM | POA: Diagnosis not present

## 2017-08-09 DIAGNOSIS — Z7902 Long term (current) use of antithrombotics/antiplatelets: Secondary | ICD-10-CM | POA: Insufficient documentation

## 2017-08-09 DIAGNOSIS — Z7984 Long term (current) use of oral hypoglycemic drugs: Secondary | ICD-10-CM | POA: Diagnosis not present

## 2017-08-09 DIAGNOSIS — I2119 ST elevation (STEMI) myocardial infarction involving other coronary artery of inferior wall: Secondary | ICD-10-CM | POA: Insufficient documentation

## 2017-08-09 DIAGNOSIS — Z7982 Long term (current) use of aspirin: Secondary | ICD-10-CM | POA: Diagnosis not present

## 2017-08-09 LAB — BASIC METABOLIC PANEL
Anion gap: 8 (ref 5–15)
BUN: 14 mg/dL (ref 6–20)
CO2: 23 mmol/L (ref 22–32)
Calcium: 9.2 mg/dL (ref 8.9–10.3)
Chloride: 106 mmol/L (ref 101–111)
Creatinine, Ser: 0.91 mg/dL (ref 0.61–1.24)
GFR calc Af Amer: >60 mL/min (ref >60)
GLUCOSE: 76 mg/dL (ref 65–99)
POTASSIUM: 3.9 mmol/L (ref 3.5–5.1)
Sodium: 137 mmol/L (ref 135–145)

## 2017-08-09 NOTE — Progress Notes (Signed)
Daily Session Note  Patient Details  Name: Lawrence Holmes [place] MRN: [id number] Date of Birth: 1956/01/18 Referring Provider:  Fletcher Anon  Encounter Date: 08/09/2017  Check In: Session Check In - 08/09/17 1154      Check-In   Location  [facility]-Cardiac & Pulmonary Re[facility]    Staff Present  Renita Papa, RN BSN;Bernice Mullin, RN, Vickki Hearing, BA, ACSM CEP, Exercise Physiologist    Supervising physician immediately available to respond to emergencies  See telemetry face sheet for immediately available ER MD    Medication changes reported      No    Fall or balance concerns reported     No    Tobacco Cessation  No Change    Warm-up and Cool-down  Performed as group-led instruction    Resistance Training Performed  Yes    VAD Patient?  No      Pain Assessment   Currently in Pain?  No/denies          Social History   Tobacco Use  Smoking Status Current Every Day Smoker  . Packs/day: 0.25  Smokeless Tobacco Never Used    Goals Met:  Proper associated with RPD/PD & O2 Sat Exercise tolerated well Personal goals reviewed No report of cardiac concerns or symptoms Strength training completed today  Goals Unmet:  Not Applicable  Comments:     Dr. Emily Filbert is Medical Director for [place] and [facility].

## 2017-08-09 NOTE — Progress Notes (Signed)
Cardiac Individual Treatment Plan  Patient Details  Name: Lawrence Holmes MRN: [id number] Date of Birth: 09-12-1955 Referring Provider:  Fletcher Anon   Initial Encounter Date: 08/09/2017  Visit Diagnosis: ST elevation myocardial infarction (STEMI), unspecified artery ([place])  Patient's Home Medications on Admission:  Current Outpatient Medications:  .  aspirin 81 MG chewable tablet, Chew 1 tablet (81 mg total) by mouth daily., Disp: 30 tablet, Rfl: 2 .  atorvastatin (LIPITOR) 80 MG tablet, Take 1 tablet (80 mg total) by mouth daily at 6 PM., Disp: 30 tablet, Rfl: 2 .  carvedilol (COREG) 3.125 MG tablet, Take 1 tablet (3.125 mg total) by mouth 2 (two) times daily with a meal., Disp: 60 tablet, Rfl: 1 .  clopidogrel (PLAVIX) 75 MG tablet, Take 1 tablet (75 mg total) by mouth daily with breakfast., Disp: 30 tablet, Rfl: 2 .  metFORMIN (GLUCOPHAGE) 1000 MG tablet, Take 1 tablet (1,000 mg total) by mouth 2 (two) times daily with a meal., Disp: 60 tablet, Rfl: 1 .  nitroGLYCERIN (NITROSTAT) 0.4 MG SL tablet, Place 1 tablet (0.4 mg total) under the tongue every 5 (five) minutes as needed for chest pain., Disp: 30 tablet, Rfl: 2 .  sacubitril-valsartan (ENTRESTO) 24-26 MG, Take 1 tablet by mouth 2 (two) times daily., Disp: 180 tablet, Rfl: 3  Past Medical History: Past Medical History:  Diagnosis Date  . Chronic systolic heart failure ([place]) 07/2017   Ejection fraction of 30% due to ischemic cardiomyopathy  . Coronary artery disease 07/2017   Late presenting inferior ST elevation myocardial infarction. cardiac catheterization showed severe two-vessel coronary artery disease with total occlusion of the proximal right coronary artery with left-to-right collaterals and occluded distal left circumflex with collaterals.  No obstructive disease affecting the LAD.  The culprit was felt to be the right coronary artery.   . Diabetes mellitus without complication ([place])   . Hyperlipidemia   . Tobacco use      Tobacco Use: Social History   Tobacco Use  Smoking Status Current Every Day Smoker  . Packs/day: 0.25  Smokeless Tobacco Never Used    Labs: Recent Review Flowsheet Data    Labs for ITP Cardiac and Pulmonary Rehab Latest Ref Rng & Units 07/21/2017   Hemoglobin A1c 4.8 - 5.6 % 11.4(H)       Exercise Target Goals:    Exercise Program Goal: Individual exercise prescription set with THRR, safety & activity barriers. Participant demonstrates ability to understand and report RPE using BORG scale, to self-measure pulse accurately, and to acknowledge the importance of the exercise prescription.  Exercise Prescription Goal: Starting with aerobic activity 30 plus minutes a day, 3 days per week for initial exercise prescription. Provide home exercise prescription and guidelines that participant acknowledges understanding prior to discharge.  Activity Barriers & Risk Stratification: Activity Barriers & Cardiac Risk Stratification - 08/09/17 1232      Activity Barriers & Cardiac Risk Stratification   Activity Barriers  None    Cardiac Risk Stratification  High       6 Minute Walk:   Oxygen Initial Assessment:   Oxygen Re-Evaluation:   Oxygen Discharge (Final Oxygen Re-Evaluation):   Initial Exercise Prescription:   Perform Capillary Blood Glucose checks as needed.  Exercise Prescription Changes:   Exercise Comments:   Exercise Goals and Review:   Exercise Goals Re-Evaluation :   Discharge Exercise Prescription (Final Exercise Prescription Changes):   Nutrition:  Target Goals: Understanding of nutrition guidelines, daily intake of sodium 1500mg , cholesterol 200mg , calories 30%  from fat and 7% or less from saturated fats, daily to have 5 or more servings of fruits and vegetables.  Biometrics:    Nutrition Therapy Plan and Nutrition Goals: Nutrition Therapy & Goals - 08/09/17 1156      Nutrition Therapy   Drug/Food Interactions  Statins/Certain  Fruits       Nutrition Discharge: Rate Your Plate Scores:   Nutrition Goals Re-Evaluation:   Nutrition Goals Discharge (Final Nutrition Goals Re-Evaluation):   Psychosocial: Target Goals: Acknowledge presence or absence of significant depression and/or stress, maximize coping skills, provide positive support system. Participant is able to verbalize types and ability to use techniques and skills needed for reducing stress and depression.   Initial Review & Psychosocial Screening: Initial Psych Review & Screening - 08/09/17 1235      Initial Review   Source of Stress Concerns  Financial       Quality of Life Scores:  Quality of Life - 08/09/17 1150      Quality of Life Scores   Health/Function Pre  24.8 %    Socioeconomic Pre  28.5 %    Psych/Spiritual Pre  30 %    Family Pre  27.6 %    GLOBAL Pre  27.09 %       PHQ-9: Recent Review Flowsheet Data    Depression screen [facility] 2/9 08/09/2017   Decreased Interest 0   Down, Depressed, Hopeless 0   PHQ - 2 Score 0   Altered sleeping 0   Tired, decreased energy 0   Change in appetite 0   Feeling bad or failure about yourself  0   Trouble concentrating 0   Moving slowly or fidgety/restless 0   Suicidal thoughts 0   PHQ-9 Score 0     Interpretation of Total Score  Total Score Depression Severity:  1-4 = Minimal depression, 5-9 = Mild depression, 10-14 = Moderate depression, 15-19 = Moderately severe depression, 20-27 = Severe depression   Psychosocial Evaluation and Intervention:   Psychosocial Re-Evaluation:   Psychosocial Discharge (Final Psychosocial Re-Evaluation):   Vocational Rehabilitation: Provide vocational rehab assistance to qualifying candidates.   Vocational Rehab Evaluation & Intervention: Vocational Rehab - 08/09/17 1150      Initial Vocational Rehab Evaluation & Intervention   Assessment shows need for Vocational Rehabilitation  No       Education: Education Goals: Education classes will  be provided on a variety of topics geared toward better understanding of heart health and risk factor modification. Participant will state understanding/return demonstration of topics presented as noted by education test scores.  Learning Barriers/Preferences: Learning Barriers/Preferences - 08/09/17 1231      Learning Barriers/Preferences   Learning Barriers  None    Learning Preferences  Individual Instruction       Education Topics: General Nutrition Guidelines/Fats and Fiber: -Group instruction provided by verbal, written material, models and posters to present the general guidelines for heart healthy nutrition. Gives an explanation and review of dietary fats and fiber.   Controlling Sodium/Reading Food Labels: -Group verbal and written material supporting the discussion of sodium use in heart healthy nutrition. Review and explanation with models, verbal and written materials for utilization of the food label.   Exercise Physiology & Risk Factors: - Group verbal and written instruction with models to review the exercise physiology of the cardiovascular system and associated critical values. Details cardiovascular disease risk factors and the goals associated with each risk factor.   Aerobic Exercise & Resistance Training: - Gives group verbal  and written discussion on the health impact of inactivity. On the components of aerobic and resistive training programs and the benefits of this training and how to safely progress through these programs.   Flexibility, Balance, General Exercise Guidelines: - Provides group verbal and written instruction on the benefits of flexibility and balance training programs. Provides general exercise guidelines with specific guidelines to those with heart or lung disease. Demonstration and skill practice provided.   Stress Management: - Provides group verbal and written instruction about the health risks of elevated stress, cause of high stress, and  healthy ways to reduce stress.   Depression: - Provides group verbal and written instruction on the correlation between heart/lung disease and depressed mood, treatment options, and the stigmas associated with seeking treatment.   Anatomy & Physiology of the Heart: - Group verbal and written instruction and models provide basic cardiac anatomy and physiology, with the coronary electrical and arterial systems. Review of: AMI, Angina, Valve disease, Heart Failure, Cardiac Arrhythmia, Pacemakers, and the ICD.   Cardiac Procedures: - Group verbal and written instruction to review commonly prescribed medications for heart disease. Reviews the medication, class of the drug, and side effects. Includes the steps to properly store meds and maintain the prescription regimen. (beta blockers and nitrates)   Cardiac Medications I: - Group verbal and written instruction to review commonly prescribed medications for heart disease. Reviews the medication, class of the drug, and side effects. Includes the steps to properly store meds and maintain the prescription regimen.   Cardiac Medications II: -Group verbal and written instruction to review commonly prescribed medications for heart disease. Reviews the medication, class of the drug, and side effects. (all other drug classes)    Go Sex-Intimacy & Heart Disease, Get SMART - Goal Setting: - Group verbal and written instruction through game format to discuss heart disease and the return to sexual intimacy. Provides group verbal and written material to discuss and apply goal setting through the application of the S.M.A.R.T. Method.   Other Matters of the Heart: - Provides group verbal, written materials and models to describe Heart Failure, Angina, Valve Disease, Peripheral Artery Disease, and Diabetes in the realm of heart disease. Includes description of the disease process and treatment options available to the cardiac patient.   Exercise &  Equipment Safety: - Individual verbal instruction and demonstration of equipment use and safety with use of the equipment.   Cardiac Rehab from 08/09/2017 in [facility] Cardiac and Pulmonary Rehab  Date  08/09/17  Educator  C. Yves Fodor, RN  Instruction Review Code  3- Needs Reinforcement      Infection Prevention: - Provides verbal and written material to individual with discussion of infection control including proper hand washing and proper equipment cleaning during exercise session.   Cardiac Rehab from 08/09/2017 in [facility] Cardiac and Pulmonary Rehab  Date  08/09/17  Educator  C. EnterkinRN  Instruction Review Code  1- Verbalizes Understanding      Falls Prevention: - Provides verbal and written material to individual with discussion of falls prevention and safety.   Cardiac Rehab from 08/09/2017 in [facility] Cardiac and Pulmonary Rehab  Date  08/09/17  Educator  C. [place]  Instruction Review Code  2- Demonstrated Understanding      Diabetes: - Individual verbal and written instruction to review signs/symptoms of diabetes, desired ranges of glucose level fasting, after meals and with exercise. Acknowledge that pre and post exercise glucose checks will be done for 3 sessions at entry of program.  Cardiac Rehab from 08/09/2017 in [facility] Cardiac and Pulmonary Rehab  Date  08/09/17  Educator  C. EnterkinRN  Instruction Review Code  1- Verbalizes Understanding      Other: -Provides group and verbal instruction on various topics (see comments)    Knowledge Questionnaire Score: Knowledge Questionnaire Score - 08/09/17 1152      Knowledge Questionnaire Score   Pre Score  13       Core Components/Risk Factors/Patient Goals at Admission: Personal Goals and Risk Factors at Admission - 08/09/17 1233      Core Components/Risk Factors/Patient Goals on Admission   Number of packs per day  0.25    Intervention  -- Lawrence Holmes said he will quit the first of Jan        Core Components/Risk  Factors/Patient Goals Review:    Core Components/Risk Factors/Patient Goals at Discharge (Final Review):    ITP Comments: ITP Comments    Row Name 08/09/17 1154 08/09/17 1233         ITP Comments  ITP created during Medical Review after the Cardiac Rehab Informed consent was signed by Lawrence Holmes. Diagnosis documented hospital discharge note 07/23/2017.  Lawrence Holmes said his plan is to quit smoking the first of January since "I feel a lot better now that I have cut back smoking after my heart attack". 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 Lawrence Holmes reports that normally his HBA1c is 11 "since I have been eating bad lately but normally it is in the 7 range".          Comments:

## 2017-08-09 NOTE — Patient Instructions (Addendum)
Patient Instructions  Patient Details  Name: Lawrence Holmes MRN: [id number] Date of Birth: 12/02/1955 Referring Provider:  Wellington Hampshire, MD  Below are the personal goals you chose as well as exercise and nutrition goals. Our goal is to help you keep on track towards obtaining and maintaining your goals. We will be discussing your progress on these goals with you throughout the program.  Initial Exercise Prescription: Initial Exercise Prescription - 08/09/17 1300      Date of Initial Exercise RX and Referring Provider   Date  08/09/17    Referring Provider  Arida      Treadmill   MPH  2.2    Grade  1    Minutes  15    METs  3      Recumbant Bike   Level  3    RPM  60    Watts  34    Minutes  15    METs  3      REL-XR   Level  3    Speed  50    Minutes  15      T5 Nustep   Level  2    SPM  80    Minutes  15    METs  3      Prescription Details   Frequency (times per week)  3    Duration  Progress to 45 minutes of aerobic exercise without signs/symptoms of physical distress      Intensity   THRR 40-80% of Max Heartrate  116-144    Ratings of Perceived Exertion  11-13    Perceived Dyspnea  0-4      Resistance Training   Training Prescription  Yes    Weight  4lb    Reps  10-15       Exercise Goals: Frequency: Be able to perform aerobic exercise three times per week working toward 3-5 days per week.  Intensity: Work with a perceived exertion of 11 (fairly light) - 15 (hard) as tolerated. Follow your new exercise prescription and watch for changes in prescription as you progress with the program. Changes will be reviewed with you when they are made.  Duration: You should be able to do 30 minutes of continuous aerobic exercise in addition to a 5 minute warm-up and a 5 minute cool-down routine.  Nutrition Goals: Your personal nutrition goals will be established when you do your nutrition analysis with the dietician.  The following are nutrition guidelines to  follow: Cholesterol < 200mg /day Sodium < 1500mg /day Fiber: Men over 50 yrs - 30 grams per day  Personal Goals: Personal Goals and Risk Factors at Admission - 08/09/17 1233      Core Components/Risk Factors/Patient Goals on Admission    Weight Management  Yes;Weight Loss    Intervention  Weight Management: Develop a combined nutrition and exercise program designed to reach desired caloric intake, while maintaining appropriate intake of nutrient and fiber, sodium and fats, and appropriate energy expenditure required for the weight goal.;Weight Management: Provide education and appropriate resources to help participant work on and attain dietary goals.    Admit Weight  202 lb 6.1 oz (91.8 kg)    Goal Weight: Short Term  197 lb (89.4 kg)    Goal Weight: Long Term  192 lb (87.1 kg)    Expected Outcomes  Short Term: Continue to assess and modify interventions until short term weight is achieved;Long Term: Adherence to nutrition and physical activity/exercise program aimed toward  attainment of established weight goal;Weight Loss: Understanding of general recommendations for a balanced deficit meal plan, which promotes 1-2 lb weight loss per week and includes a negative energy balance of [phone] kcal/d;Understanding recommendations for meals to include 15-35% energy as protein, 25-35% energy from fat, 35-60% energy from carbohydrates, less than 200mg  of dietary cholesterol, 20-35 gm of total fiber daily;Understanding of distribution of calorie intake throughout the day with the consumption of 4-5 meals/snacks    Tobacco Cessation  Yes    Number of packs per day  0.25    Intervention  -- Hipolito said he will quit the first of Jan     Expected Outcomes  Short Term: Will demonstrate readiness to quit, by selecting a quit date.;Long Term: Complete abstinence from all tobacco products for at least 12 months from quit date.;Short Term: Will quit all tobacco product use, adhering to prevention of relapse plan.     Diabetes  Yes    Intervention  Provide education about signs/symptoms and action to take for hypo/hyperglycemia.;Provide education about proper nutrition, including hydration, and aerobic/resistive exercise prescription along with prescribed medications to achieve blood glucose in normal ranges: Fasting glucose 65-99 mg/dL    Expected Outcomes  Short Term: Participant verbalizes understanding of the signs/symptoms and immediate care of hyper/hypoglycemia, proper foot care and importance of medication, aerobic/resistive exercise and nutrition plan for blood glucose control.;Long Term: Attainment of HbA1C < 7%.    Hypertension  Yes    Intervention  Provide education on lifestyle modifcations including regular physical activity/exercise, weight management, moderate sodium restriction and increased consumption of fresh fruit, vegetables, and low fat dairy, alcohol moderation, and smoking cessation.;Monitor prescription use compliance.    Expected Outcomes  Short Term: Continued assessment and intervention until BP is < 140/69mm HG in hypertensive participants. < 130/56mm HG in hypertensive participants with diabetes, heart failure or chronic kidney disease.;Long Term: Maintenance of blood pressure at goal levels.    Stress  Yes    Intervention  Offer individual and/or small group education and counseling on adjustment to heart disease, stress management and health-related lifestyle change. Teach and support self-help strategies.;Refer participants experiencing significant psychosocial distress to appropriate mental health specialists for further evaluation and treatment. When possible, include family members and significant others in education/counseling sessions.    Expected Outcomes  Short Term: Participant demonstrates changes in health-related behavior, relaxation and other stress management skills, ability to obtain effective social support, and compliance with psychotropic medications if prescribed.;Long  Term: Emotional wellbeing is indicated by absence of clinically significant psychosocial distress or social isolation.       Tobacco Use Initial Evaluation: Social History   Tobacco Use  Smoking Status Current Every Day Smoker  . Packs/day: 0.25  Smokeless Tobacco Never Used    Exercise Goals and Review: Exercise Goals    Row Name 08/09/17 1340             Exercise Goals   Increase Physical Activity  Yes       Intervention  Provide advice, education, support and counseling about physical activity/exercise needs.;Develop an individualized exercise prescription for aerobic and resistive training based on initial evaluation findings, risk stratification, comorbidities and participant's personal goals.       Expected Outcomes  Achievement of increased cardiorespiratory fitness and enhanced flexibility, muscular endurance and strength shown through measurements of functional capacity and personal statement of participant.       Increase Strength and Stamina  Yes       Intervention  Provide advice, education, support and counseling about physical activity/exercise needs.;Develop an individualized exercise prescription for aerobic and resistive training based on initial evaluation findings, risk stratification, comorbidities and participant's personal goals.       Expected Outcomes  Achievement of increased cardiorespiratory fitness and enhanced flexibility, muscular endurance and strength shown through measurements of functional capacity and personal statement of participant.       Able to understand and use rate of perceived exertion (RPE) scale  Yes       Intervention  Provide education and explanation on how to use RPE scale       Expected Outcomes  Short Term: Able to use RPE daily in rehab to express subjective intensity level;Long Term:  Able to use RPE to guide intensity level when exercising independently       Able to understand and use Dyspnea scale  Yes       Intervention   Provide education and explanation on how to use Dyspnea scale       Expected Outcomes  Short Term: Able to use Dyspnea scale daily in rehab to express subjective sense of shortness of breath during exertion;Long Term: Able to use Dyspnea scale to guide intensity level when exercising independently       Knowledge and understanding of Target Heart Rate Range (THRR)  Yes       Intervention  Provide education and explanation of THRR including how the numbers were predicted and where they are located for reference       Expected Outcomes  Short Term: Able to state/look up THRR;Long Term: Able to use THRR to govern intensity when exercising independently;Short Term: Able to use daily as guideline for intensity in rehab       Able to check pulse independently  Yes       Intervention  Provide education and demonstration on how to check pulse in carotid and radial arteries.;Review the importance of being able to check your own pulse for safety during independent exercise       Expected Outcomes  Short Term: Able to explain why pulse checking is important during independent exercise;Long Term: Able to check pulse independently and accurately       Understanding of Exercise Prescription  Yes       Intervention  Provide education, explanation, and written materials on patient's individual exercise prescription       Expected Outcomes  Short Term: Able to explain program exercise prescription;Long Term: Able to explain home exercise prescription to exercise independently          Copy of goals given to participant.

## 2017-08-09 NOTE — Progress Notes (Signed)
Cardiac Individual Treatment Plan  Patient Details  Name: Lawrence Holmes Holmes MRN: [id number] Date of Birth: 1956/04/13 Referring Provider:     Cardiac Rehab from 08/09/2017 in [facility] Cardiac and Pulmonary Rehab  Referring Provider  Arida      Initial Encounter Date:    Cardiac Rehab from 08/09/2017 in [facility] Cardiac and Pulmonary Rehab  Date  08/09/17  Referring Provider  Fletcher Anon      Visit Diagnosis: ST elevation myocardial infarction (STEMI), unspecified artery ([place])  Patient's Home Medications on Admission:  Current Outpatient Medications:  .  aspirin 81 MG chewable tablet, Chew 1 tablet (81 mg total) by mouth daily., Disp: 30 tablet, Rfl: 2 .  atorvastatin (LIPITOR) 80 MG tablet, Take 1 tablet (80 mg total) by mouth daily at 6 PM., Disp: 30 tablet, Rfl: 2 .  carvedilol (COREG) 3.125 MG tablet, Take 1 tablet (3.125 mg total) by mouth 2 (two) times daily with a meal., Disp: 60 tablet, Rfl: 1 .  clopidogrel (PLAVIX) 75 MG tablet, Take 1 tablet (75 mg total) by mouth daily with breakfast., Disp: 30 tablet, Rfl: 2 .  metFORMIN (GLUCOPHAGE) 1000 MG tablet, Take 1 tablet (1,000 mg total) by mouth 2 (two) times daily with a meal., Disp: 60 tablet, Rfl: 1 .  nitroGLYCERIN (NITROSTAT) 0.4 MG SL tablet, Place 1 tablet (0.4 mg total) under the tongue every 5 (five) minutes as needed for chest pain., Disp: 30 tablet, Rfl: 2 .  sacubitril-valsartan (ENTRESTO) 24-26 MG, Take 1 tablet by mouth 2 (two) times daily., Disp: 180 tablet, Rfl: 3  Past Medical History: Past Medical History:  Diagnosis Date  . Chronic systolic heart failure ([place]) 07/2017   Ejection fraction of 30% due to ischemic cardiomyopathy  . Coronary artery disease 07/2017   Late presenting inferior ST elevation myocardial infarction. cardiac catheterization showed severe two-vessel coronary artery disease with total occlusion of the proximal right coronary artery with left-to-right collaterals and occluded distal left circumflex with  collaterals.  No obstructive disease affecting the LAD.  The culprit was felt to be the right coronary artery.   . Diabetes mellitus without complication ([place])   . Hyperlipidemia   . Tobacco use     Tobacco Use: Social History   Tobacco Use  Smoking Status Current Every Day Smoker  . Packs/day: 0.25  Smokeless Tobacco Never Used    Labs: Recent Review Flowsheet Data    Labs for ITP Cardiac and Pulmonary Rehab Latest Ref Rng & Units 07/21/2017   Hemoglobin A1c 4.8 - 5.6 % 11.4(H)       Exercise Target Goals: Date: 08/09/17  Exercise Program Goal: Individual exercise prescription set with THRR, safety & activity barriers. Participant demonstrates ability to understand and report RPE using BORG scale, to self-measure pulse accurately, and to acknowledge the importance of the exercise prescription.  Exercise Prescription Goal: Starting with aerobic activity 30 plus minutes a day, 3 days per week for initial exercise prescription. Provide home exercise prescription and guidelines that participant acknowledges understanding prior to discharge.  Activity Barriers & Risk Stratification: Activity Barriers & Cardiac Risk Stratification - 08/09/17 1232      Activity Barriers & Cardiac Risk Stratification   Activity Barriers  None    Cardiac Risk Stratification  High       6 Minute Walk: 6 Minute Walk    Row Name 08/09/17 1341         6 Minute Walk   Distance  1200 feet     Walk Time  6 minutes     # of Rest Breaks  0     MPH  2.27     METS  3.18     RPE  13     Perceived Dyspnea   2     VO2 Peak  11.12     Symptoms  No     Resting HR  88 bpm     Resting BP  98/56     Exercise Oxygen Saturation  during 6 min walk  98 %     Max Ex. HR  120 bpm     Max Ex. BP  102/58     2 Minute Post BP  92/48        Oxygen Initial Assessment:   Oxygen Re-Evaluation:   Oxygen Discharge (Final Oxygen Re-Evaluation):   Initial Exercise Prescription: Initial Exercise  Prescription - 08/09/17 1300      Date of Initial Exercise RX and Referring Provider   Date  08/09/17    Referring Provider  Arida      Treadmill   MPH  2.2    Grade  1    Minutes  15    METs  3      Recumbant Bike   Level  3    RPM  60    Watts  34    Minutes  15    METs  3      REL-XR   Level  3    Speed  50    Minutes  15      T5 Nustep   Level  2    SPM  80    Minutes  15    METs  3      Prescription Details   Frequency (times per week)  3    Duration  Progress to 45 minutes of aerobic exercise without signs/symptoms of physical distress      Intensity   THRR 40-80% of Max Heartrate  116-144    Ratings of Perceived Exertion  11-13    Perceived Dyspnea  0-4      Resistance Training   Training Prescription  Yes    Weight  4lb    Reps  10-15       Perform Capillary Blood Glucose checks as needed.  Exercise Prescription Changes:   Exercise Comments:   Exercise Goals and Review:  Exercise Goals    Row Name 08/09/17 1340             Exercise Goals   Increase Physical Activity  Yes       Intervention  Provide advice, education, support and counseling about physical activity/exercise needs.;Develop an individualized exercise prescription for aerobic and resistive training based on initial evaluation findings, risk stratification, comorbidities and participant's personal goals.       Expected Outcomes  Achievement of increased cardiorespiratory fitness and enhanced flexibility, muscular endurance and strength shown through measurements of functional capacity and personal statement of participant.       Increase Strength and Stamina  Yes       Intervention  Provide advice, education, support and counseling about physical activity/exercise needs.;Develop an individualized exercise prescription for aerobic and resistive training based on initial evaluation findings, risk stratification, comorbidities and participant's personal goals.       Expected  Outcomes  Achievement of increased cardiorespiratory fitness and enhanced flexibility, muscular endurance and strength shown through measurements of functional capacity and personal statement of participant.  Able to understand and use rate of perceived exertion (RPE) scale  Yes       Intervention  Provide education and explanation on how to use RPE scale       Expected Outcomes  Short Term: Able to use RPE daily in rehab to express subjective intensity level;Long Term:  Able to use RPE to guide intensity level when exercising independently       Able to understand and use Dyspnea scale  Yes       Intervention  Provide education and explanation on how to use Dyspnea scale       Expected Outcomes  Short Term: Able to use Dyspnea scale daily in rehab to express subjective sense of shortness of breath during exertion;Long Term: Able to use Dyspnea scale to guide intensity level when exercising independently       Knowledge and understanding of Target Heart Rate Range (THRR)  Yes       Intervention  Provide education and explanation of THRR including how the numbers were predicted and where they are located for reference       Expected Outcomes  Short Term: Able to state/look up THRR;Long Term: Able to use THRR to govern intensity when exercising independently;Short Term: Able to use daily as guideline for intensity in rehab       Able to check pulse independently  Yes       Intervention  Provide education and demonstration on how to check pulse in carotid and radial arteries.;Review the importance of being able to check your own pulse for safety during independent exercise       Expected Outcomes  Short Term: Able to explain why pulse checking is important during independent exercise;Long Term: Able to check pulse independently and accurately       Understanding of Exercise Prescription  Yes       Intervention  Provide education, explanation, and written materials on patient's individual exercise  prescription       Expected Outcomes  Short Term: Able to explain program exercise prescription;Long Term: Able to explain home exercise prescription to exercise independently          Exercise Goals Re-Evaluation :   Discharge Exercise Prescription (Final Exercise Prescription Changes):   Nutrition:  Target Goals: Understanding of nutrition guidelines, daily intake of sodium 1500mg , cholesterol 200mg , calories 30% from fat and 7% or less from saturated fats, daily to have 5 or more servings of fruits and vegetables.  Biometrics: Pre Biometrics - 08/09/17 1340      Pre Biometrics   Height  6' (1.829 m)    Weight  202 lb 4.8 oz (91.8 kg)    Waist Circumference  40 inches    Hip Circumference  42 inches    Waist to Hip Ratio  0.95 %    BMI (Calculated)  27.43    Single Leg Stand  21.42 seconds        Nutrition Therapy Plan and Nutrition Goals: Nutrition Therapy & Goals - 08/09/17 1342      Intervention Plan   Intervention  Prescribe, educate and counsel regarding individualized specific dietary modifications aiming towards targeted core components such as weight, hypertension, lipid management, diabetes, heart failure and other comorbidities.    Expected Outcomes  Short Term Goal: Understand basic principles of dietary content, such as calories, fat, sodium, cholesterol and nutrients.;Long Term Goal: Adherence to prescribed nutrition plan.       Nutrition Discharge: Rate Your Plate Scores: Nutrition Assessments -  08/09/17 1415      MEDFICTS Scores   Pre Score  -- pt did not complete, sent home to bring back first day.        Nutrition Goals Re-Evaluation:   Nutrition Goals Discharge (Final Nutrition Goals Re-Evaluation):   Psychosocial: Target Goals: Acknowledge presence or absence of significant depression and/or stress, maximize coping skills, provide positive support system. Participant is able to verbalize types and ability to use techniques and skills needed  for reducing stress and depression.   Initial Review & Psychosocial Screening: Initial Psych Review & Screening - 08/09/17 1235      Initial Review   Current issues with  Current Stress Concerns    Source of Stress Concerns  Financial      Family Dynamics   Good Support System?  Yes      Barriers   Psychosocial barriers to participate in program  The patient should benefit from training in stress management and relaxation.;Psychosocial barriers identified (see note)      Screening Interventions   Interventions  Encouraged to exercise;Yes;Program counselor consult    Expected Outcomes  Short Term goal: Utilizing psychosocial counselor, staff and physician to assist with identification of specific Stressors or current issues interfering with healing process. Setting desired goal for each stressor or current issue identified.;Long Term Goal: Stressors or current issues are controlled or eliminated.       Quality of Life Scores:  Quality of Life - 08/09/17 1150      Quality of Life Scores   Health/Function Pre  24.8 %    Socioeconomic Pre  28.5 %    Psych/Spiritual Pre  30 %    Family Pre  27.6 %    GLOBAL Pre  27.09 %       PHQ-9: Recent Review Flowsheet Data    Depression screen [facility] 2/9 08/09/2017   Decreased Interest 0   Down, Depressed, Hopeless 0   PHQ - 2 Score 0   Altered sleeping 0   Tired, decreased energy 0   Change in appetite 0   Feeling bad or failure about yourself  0   Trouble concentrating 0   Moving slowly or fidgety/restless 0   Suicidal thoughts 0   PHQ-9 Score 0     Interpretation of Total Score  Total Score Depression Severity:  1-4 = Minimal depression, 5-9 = Mild depression, 10-14 = Moderate depression, 15-19 = Moderately severe depression, 20-27 = Severe depression   Psychosocial Evaluation and Intervention:   Psychosocial Re-Evaluation:   Psychosocial Discharge (Final Psychosocial Re-Evaluation):   Vocational Rehabilitation: Provide  vocational rehab assistance to qualifying candidates.   Vocational Rehab Evaluation & Intervention: Vocational Rehab - 08/09/17 1150      Initial Vocational Rehab Evaluation & Intervention   Assessment shows need for Vocational Rehabilitation  No       Education: Education Goals: Education classes will be provided on a variety of topics geared toward better understanding of heart health and risk factor modification. Participant will state understanding/return demonstration of topics presented as noted by education test scores.  Learning Barriers/Preferences: Learning Barriers/Preferences - 08/09/17 1231      Learning Barriers/Preferences   Learning Barriers  None    Learning Preferences  Individual Instruction       Education Topics: General Nutrition Guidelines/Fats and Fiber: -Group instruction provided by verbal, written material, models and posters to present the general guidelines for heart healthy nutrition. Gives an explanation and review of dietary fats and fiber.  Controlling Sodium/Reading Food Labels: -Group verbal and written material supporting the discussion of sodium use in heart healthy nutrition. Review and explanation with models, verbal and written materials for utilization of the food label.   Exercise Physiology & Risk Factors: - Group verbal and written instruction with models to review the exercise physiology of the cardiovascular system and associated critical values. Details cardiovascular disease risk factors and the goals associated with each risk factor.   Aerobic Exercise & Resistance Training: - Gives group verbal and written discussion on the health impact of inactivity. On the components of aerobic and resistive training programs and the benefits of this training and how to safely progress through these programs.   Flexibility, Balance, General Exercise Guidelines: - Provides group verbal and written instruction on the benefits of flexibility  and balance training programs. Provides general exercise guidelines with specific guidelines to those with heart or lung disease. Demonstration and skill practice provided.   Stress Management: - Provides group verbal and written instruction about the health risks of elevated stress, cause of high stress, and healthy ways to reduce stress.   Depression: - Provides group verbal and written instruction on the correlation between heart/lung disease and depressed mood, treatment options, and the stigmas associated with seeking treatment.   Anatomy & Physiology of the Heart: - Group verbal and written instruction and models provide basic cardiac anatomy and physiology, with the coronary electrical and arterial systems. Review of: AMI, Angina, Valve disease, Heart Failure, Cardiac Arrhythmia, Pacemakers, and the ICD.   Cardiac Procedures: - Group verbal and written instruction to review commonly prescribed medications for heart disease. Reviews the medication, class of the drug, and side effects. Includes the steps to properly store meds and maintain the prescription regimen. (beta blockers and nitrates)   Cardiac Medications I: - Group verbal and written instruction to review commonly prescribed medications for heart disease. Reviews the medication, class of the drug, and side effects. Includes the steps to properly store meds and maintain the prescription regimen.   Cardiac Medications II: -Group verbal and written instruction to review commonly prescribed medications for heart disease. Reviews the medication, class of the drug, and side effects. (all other drug classes)    Go Sex-Intimacy & Heart Disease, Get SMART - Goal Setting: - Group verbal and written instruction through game format to discuss heart disease and the return to sexual intimacy. Provides group verbal and written material to discuss and apply goal setting through the application of the S.M.A.R.T. Method.   Other Matters  of the Heart: - Provides group verbal, written materials and models to describe Heart Failure, Angina, Valve Disease, Peripheral Artery Disease, and Diabetes in the realm of heart disease. Includes description of the disease process and treatment options available to the cardiac patient.   Exercise & Equipment Safety: - Individual verbal instruction and demonstration of equipment use and safety with use of the equipment.   Cardiac Rehab from 08/09/2017 in [facility] Cardiac and Pulmonary Rehab  Date  08/09/17  Educator  C. Enterkin, RN  Instruction Review Code  3- Needs Reinforcement      Infection Prevention: - Provides verbal and written material to individual with discussion of infection control including proper hand washing and proper equipment cleaning during exercise session.   Cardiac Rehab from 08/09/2017 in [facility] Cardiac and Pulmonary Rehab  Date  08/09/17  Educator  C. EnterkinRN  Instruction Review Code  1- Verbalizes Understanding      Falls Prevention: - Provides verbal and written  material to individual with discussion of falls prevention and safety.   Cardiac Rehab from 08/09/2017 in [facility] Cardiac and Pulmonary Rehab  Date  08/09/17  Educator  C. Avoca  Instruction Review Code  2- Demonstrated Understanding      Diabetes: - Individual verbal and written instruction to review signs/symptoms of diabetes, desired ranges of glucose level fasting, after meals and with exercise. Acknowledge that pre and post exercise glucose checks will be done for 3 sessions at entry of program.   Cardiac Rehab from 08/09/2017 in [facility] Cardiac and Pulmonary Rehab  Date  08/09/17  Educator  C. EnterkinRN  Instruction Review Code  1- Verbalizes Understanding      Other: -Provides group and verbal instruction on various topics (see comments)    Knowledge Questionnaire Score: Knowledge Questionnaire Score - 08/09/17 1152      Knowledge Questionnaire Score   Pre Score  13/28        Core Components/Risk Factors/Patient Goals at Admission: Personal Goals and Risk Factors at Admission - 08/09/17 1233      Core Components/Risk Factors/Patient Goals on Admission    Weight Management  Yes;Weight Loss    Intervention  Weight Management: Develop a combined nutrition and exercise program designed to reach desired caloric intake, while maintaining appropriate intake of nutrient and fiber, sodium and fats, and appropriate energy expenditure required for the weight goal.;Weight Management: Provide education and appropriate resources to help participant work on and attain dietary goals.    Admit Weight  202 lb 6.1 oz (91.8 kg)    Goal Weight: Short Term  197 lb (89.4 kg)    Goal Weight: Long Term  192 lb (87.1 kg)    Expected Outcomes  Short Term: Continue to assess and modify interventions until short term weight is achieved;Long Term: Adherence to nutrition and physical activity/exercise program aimed toward attainment of established weight goal;Weight Loss: Understanding of general recommendations for a balanced deficit meal plan, which promotes 1-2 lb weight loss per week and includes a negative energy balance of [phone] kcal/d;Understanding recommendations for meals to include 15-35% energy as protein, 25-35% energy from fat, 35-60% energy from carbohydrates, less than 200mg  of dietary cholesterol, 20-35 gm of total fiber daily;Understanding of distribution of calorie intake throughout the day with the consumption of 4-5 meals/snacks    Tobacco Cessation  Yes    Number of packs per day  0.25    Intervention  -- Vaiden said he will quit the first of Jan     Expected Outcomes  Short Term: Will demonstrate readiness to quit, by selecting a quit date.;Long Term: Complete abstinence from all tobacco products for at least 12 months from quit date.;Short Term: Will quit all tobacco product use, adhering to prevention of relapse plan.    Diabetes  Yes    Intervention  Provide education  about signs/symptoms and action to take for hypo/hyperglycemia.;Provide education about proper nutrition, including hydration, and aerobic/resistive exercise prescription along with prescribed medications to achieve blood glucose in normal ranges: Fasting glucose 65-99 mg/dL    Expected Outcomes  Short Term: Participant verbalizes understanding of the signs/symptoms and immediate care of hyper/hypoglycemia, proper foot care and importance of medication, aerobic/resistive exercise and nutrition plan for blood glucose control.;Long Term: Attainment of HbA1C < 7%.    Hypertension  Yes    Intervention  Provide education on lifestyle modifcations including regular physical activity/exercise, weight management, moderate sodium restriction and increased consumption of fresh fruit, vegetables, and low fat dairy, alcohol moderation,  and smoking cessation.;Monitor prescription use compliance.    Expected Outcomes  Short Term: Continued assessment and intervention until BP is < 140/16mm HG in hypertensive participants. < 130/1mm HG in hypertensive participants with diabetes, heart failure or chronic kidney disease.;Long Term: Maintenance of blood pressure at goal levels.    Stress  Yes    Intervention  Offer individual and/or small group education and counseling on adjustment to heart disease, stress management and health-related lifestyle change. Teach and support self-help strategies.;Refer participants experiencing significant psychosocial distress to appropriate mental health specialists for further evaluation and treatment. When possible, include family members and significant others in education/counseling sessions.    Expected Outcomes  Short Term: Participant demonstrates changes in health-related behavior, relaxation and other stress management skills, ability to obtain effective social support, and compliance with psychotropic medications if prescribed.;Long Term: Emotional wellbeing is indicated by absence  of clinically significant psychosocial distress or social isolation.       Core Components/Risk Factors/Patient Goals Review:    Core Components/Risk Factors/Patient Goals at Discharge (Final Review):    ITP Comments: ITP Comments    Row Name 08/09/17 1154 08/09/17 1233         ITP Comments  ITP created during Medical Review after the Cardiac Rehab Informed consent was signed by Lawrence Holmes. Diagnosis documented hospital discharge note 07/23/2017.  Laquinton said his plan is to quit smoking the first of January since "I feel a lot better now that I have cut back smoking after my heart attack". 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 Chisom reports that normally his HBA1c is 11 "since I have been eating bad lately but normally it is in the 7 range".          Comments: Discharge ITP

## 2017-08-16 ENCOUNTER — Telehealth: Payer: Self-pay | Admitting: Cardiovascular Disease

## 2017-08-16 NOTE — Telephone Encounter (Signed)
Pt c/o medication issue:  1. Name of Medication: Entresto   2. How are you currently taking this medication (dosage and times per day)? 24-26 mg 2x a day   3. Are you having a reaction (difficulty breathing--STAT)?   4. What is your medication issue?  Having bad diarrhea pt sister states.  Going on for about that long

## 2017-08-16 NOTE — Telephone Encounter (Signed)
S/w pt and pt's sister Hassan Rowan who report daily diarrhea since patient started taking entresto 24-26mg  on December 14. He denies blood in stool, abdominal pain, fever, or body aches.  He is able to tolerate food and liquids. No other complaints; feeling well. Pt states that metformin use to give him diarrhea but it "cleared up" 3-4 days prior to starting entresto. He did vomit once on two separate days last week but may have been related to something he ate. Today he has had only one occurrence of diarrhea.  Explained that this is not typically a side effect of entresto but will route to MD for advice and recommendations.  Prior to [facility], patient was taking losartan 12.5mg  qd and imdur 30mg  qd

## 2017-08-16 NOTE — Telephone Encounter (Signed)
Switch back to losartan 25 mg once daily.  Entresto can cause diarrhea.

## 2017-08-17 NOTE — Telephone Encounter (Signed)
Pt reports no diarrhea since yesterday morning and thinks issue may have resolved. He asks to continue entresto for a few more days to see if sx resolve. He understands he is not to take entresto and losartan together. If diarrhea resumes, he will stop entresto and start losartan 25mg  qd. Patient will call Monday with an update.

## 2017-08-22 ENCOUNTER — Encounter: Payer: Self-pay | Admitting: *Deleted

## 2017-08-22 ENCOUNTER — Encounter: Payer: 59 | Attending: Cardiovascular Disease

## 2017-08-22 DIAGNOSIS — Z79899 Other long term (current) drug therapy: Secondary | ICD-10-CM | POA: Insufficient documentation

## 2017-08-22 DIAGNOSIS — Z7982 Long term (current) use of aspirin: Secondary | ICD-10-CM | POA: Insufficient documentation

## 2017-08-22 DIAGNOSIS — I213 ST elevation (STEMI) myocardial infarction of unspecified site: Secondary | ICD-10-CM

## 2017-08-22 DIAGNOSIS — I2510 Atherosclerotic heart disease of native coronary artery without angina pectoris: Secondary | ICD-10-CM | POA: Insufficient documentation

## 2017-08-22 DIAGNOSIS — F17210 Nicotine dependence, cigarettes, uncomplicated: Secondary | ICD-10-CM | POA: Insufficient documentation

## 2017-08-22 DIAGNOSIS — Z7902 Long term (current) use of antithrombotics/antiplatelets: Secondary | ICD-10-CM | POA: Insufficient documentation

## 2017-08-22 DIAGNOSIS — I5022 Chronic systolic (congestive) heart failure: Secondary | ICD-10-CM | POA: Insufficient documentation

## 2017-08-22 DIAGNOSIS — E785 Hyperlipidemia, unspecified: Secondary | ICD-10-CM | POA: Insufficient documentation

## 2017-08-22 DIAGNOSIS — I255 Ischemic cardiomyopathy: Secondary | ICD-10-CM | POA: Insufficient documentation

## 2017-08-22 DIAGNOSIS — E119 Type 2 diabetes mellitus without complications: Secondary | ICD-10-CM | POA: Insufficient documentation

## 2017-08-22 DIAGNOSIS — Z7984 Long term (current) use of oral hypoglycemic drugs: Secondary | ICD-10-CM | POA: Insufficient documentation

## 2017-08-22 NOTE — Progress Notes (Signed)
Cardiac Individual Treatment Plan  Patient Details  Name: Lawrence Holmes MRN: [id number] Date of Birth: 1956/07/13 Referring Provider:     Cardiac Rehab from 08/09/2017 in [facility] Cardiac and Pulmonary Rehab  Referring Provider  Arida      Initial Encounter Date:    Cardiac Rehab from 08/09/2017 in [facility] Cardiac and Pulmonary Rehab  Date  08/09/17  Referring Provider  Fletcher Anon      Visit Diagnosis: ST elevation myocardial infarction (STEMI), unspecified artery ([place])  Patient's Home Medications on Admission:  Current Outpatient Medications:  .  aspirin 81 MG chewable tablet, Chew 1 tablet (81 mg total) by mouth daily., Disp: 30 tablet, Rfl: 2 .  atorvastatin (LIPITOR) 80 MG tablet, Take 1 tablet (80 mg total) by mouth daily at 6 PM., Disp: 30 tablet, Rfl: 2 .  carvedilol (COREG) 3.125 MG tablet, Take 1 tablet (3.125 mg total) by mouth 2 (two) times daily with a meal., Disp: 60 tablet, Rfl: 1 .  clopidogrel (PLAVIX) 75 MG tablet, Take 1 tablet (75 mg total) by mouth daily with breakfast., Disp: 30 tablet, Rfl: 2 .  metFORMIN (GLUCOPHAGE) 1000 MG tablet, Take 1 tablet (1,000 mg total) by mouth 2 (two) times daily with a meal., Disp: 60 tablet, Rfl: 1 .  nitroGLYCERIN (NITROSTAT) 0.4 MG SL tablet, Place 1 tablet (0.4 mg total) under the tongue every 5 (five) minutes as needed for chest pain., Disp: 30 tablet, Rfl: 2 .  sacubitril-valsartan (ENTRESTO) 24-26 MG, Take 1 tablet by mouth 2 (two) times daily., Disp: 180 tablet, Rfl: 3  Past Medical History: Past Medical History:  Diagnosis Date  . Chronic systolic heart failure ([place]) 07/2017   Ejection fraction of 30% due to ischemic cardiomyopathy  . Coronary artery disease 07/2017   Late presenting inferior ST elevation myocardial infarction. cardiac catheterization showed severe two-vessel coronary artery disease with total occlusion of the proximal right coronary artery with left-to-right collaterals and occluded distal left circumflex with  collaterals.  No obstructive disease affecting the LAD.  The culprit was felt to be the right coronary artery.   . Diabetes mellitus without complication ([place])   . Hyperlipidemia   . Tobacco use     Tobacco Use: Social History   Tobacco Use  Smoking Status Current Every Day Smoker  . Packs/day: 0.25  Smokeless Tobacco Never Used    Labs: Recent Review Flowsheet Data    Labs for ITP Cardiac and Pulmonary Rehab Latest Ref Rng & Units 07/21/2017   Hemoglobin A1c 4.8 - 5.6 % 11.4(H)       Exercise Target Goals:    Exercise Program Goal: Individual exercise prescription set with THRR, safety & activity barriers. Participant demonstrates ability to understand and report RPE using BORG scale, to self-measure pulse accurately, and to acknowledge the importance of the exercise prescription.  Exercise Prescription Goal: Starting with aerobic activity 30 plus minutes a day, 3 days per week for initial exercise prescription. Provide home exercise prescription and guidelines that participant acknowledges understanding prior to discharge.  Activity Barriers & Risk Stratification: Activity Barriers & Cardiac Risk Stratification - 08/09/17 1232      Activity Barriers & Cardiac Risk Stratification   Activity Barriers  None    Cardiac Risk Stratification  High       6 Minute Walk: 6 Minute Walk    Row Name 08/09/17 1341         6 Minute Walk   Distance  1200 feet     Walk Time  6 minutes     # of Rest Breaks  0     MPH  2.27     METS  3.18     RPE  13     Perceived Dyspnea   2     VO2 Peak  11.12     Symptoms  No     Resting HR  88 bpm     Resting BP  98/56     Exercise Oxygen Saturation  during 6 min walk  98 %     Max Ex. HR  120 bpm     Max Ex. BP  102/58     2 Minute Post BP  92/48        Oxygen Initial Assessment:   Oxygen Re-Evaluation:   Oxygen Discharge (Final Oxygen Re-Evaluation):   Initial Exercise Prescription: Initial Exercise Prescription -  08/09/17 1300      Date of Initial Exercise RX and Referring Provider   Date  08/09/17    Referring Provider  Arida      Treadmill   MPH  2.2    Grade  1    Minutes  15    METs  3      Recumbant Bike   Level  3    RPM  60    Watts  34    Minutes  15    METs  3      REL-XR   Level  3    Speed  50    Minutes  15      T5 Nustep   Level  2    SPM  80    Minutes  15    METs  3      Prescription Details   Frequency (times per week)  3    Duration  Progress to 45 minutes of aerobic exercise without signs/symptoms of physical distress      Intensity   THRR 40-80% of Max Heartrate  116-144    Ratings of Perceived Exertion  11-13    Perceived Dyspnea  0-4      Resistance Training   Training Prescription  Yes    Weight  4lb    Reps  10-15       Perform Capillary Blood Glucose checks as needed.  Exercise Prescription Changes:   Exercise Comments:   Exercise Goals and Review: Exercise Goals    Row Name 08/09/17 1340             Exercise Goals   Increase Physical Activity  Yes       Intervention  Provide advice, education, support and counseling about physical activity/exercise needs.;Develop an individualized exercise prescription for aerobic and resistive training based on initial evaluation findings, risk stratification, comorbidities and participant's personal goals.       Expected Outcomes  Achievement of increased cardiorespiratory fitness and enhanced flexibility, muscular endurance and strength shown through measurements of functional capacity and personal statement of participant.       Increase Strength and Stamina  Yes       Intervention  Provide advice, education, support and counseling about physical activity/exercise needs.;Develop an individualized exercise prescription for aerobic and resistive training based on initial evaluation findings, risk stratification, comorbidities and participant's personal goals.       Expected Outcomes  Achievement of  increased cardiorespiratory fitness and enhanced flexibility, muscular endurance and strength shown through measurements of functional capacity and personal statement of participant.  Able to understand and use rate of perceived exertion (RPE) scale  Yes       Intervention  Provide education and explanation on how to use RPE scale       Expected Outcomes  Short Term: Able to use RPE daily in rehab to express subjective intensity level;Long Term:  Able to use RPE to guide intensity level when exercising independently       Able to understand and use Dyspnea scale  Yes       Intervention  Provide education and explanation on how to use Dyspnea scale       Expected Outcomes  Short Term: Able to use Dyspnea scale daily in rehab to express subjective sense of shortness of breath during exertion;Long Term: Able to use Dyspnea scale to guide intensity level when exercising independently       Knowledge and understanding of Target Heart Rate Range (THRR)  Yes       Intervention  Provide education and explanation of THRR including how the numbers were predicted and where they are located for reference       Expected Outcomes  Short Term: Able to state/look up THRR;Long Term: Able to use THRR to govern intensity when exercising independently;Short Term: Able to use daily as guideline for intensity in rehab       Able to check pulse independently  Yes       Intervention  Provide education and demonstration on how to check pulse in carotid and radial arteries.;Review the importance of being able to check your own pulse for safety during independent exercise       Expected Outcomes  Short Term: Able to explain why pulse checking is important during independent exercise;Long Term: Able to check pulse independently and accurately       Understanding of Exercise Prescription  Yes       Intervention  Provide education, explanation, and written materials on patient's individual exercise prescription       Expected  Outcomes  Short Term: Able to explain program exercise prescription;Long Term: Able to explain home exercise prescription to exercise independently          Exercise Goals Re-Evaluation :   Discharge Exercise Prescription (Final Exercise Prescription Changes):   Nutrition:  Target Goals: Understanding of nutrition guidelines, daily intake of sodium 1500mg , cholesterol 200mg , calories 30% from fat and 7% or less from saturated fats, daily to have 5 or more servings of fruits and vegetables.  Biometrics: Pre Biometrics - 08/09/17 1340      Pre Biometrics   Height  6' (1.829 m)    Weight  202 lb 4.8 oz (91.8 kg)    Waist Circumference  40 inches    Hip Circumference  42 inches    Waist to Hip Ratio  0.95 %    BMI (Calculated)  27.43    Single Leg Stand  21.42 seconds        Nutrition Therapy Plan and Nutrition Goals: Nutrition Therapy & Goals - 08/09/17 1342      Intervention Plan   Intervention  Prescribe, educate and counsel regarding individualized specific dietary modifications aiming towards targeted core components such as weight, hypertension, lipid management, diabetes, heart failure and other comorbidities.    Expected Outcomes  Short Term Goal: Understand basic principles of dietary content, such as calories, fat, sodium, cholesterol and nutrients.;Long Term Goal: Adherence to prescribed nutrition plan.       Nutrition Discharge: Rate Your Plate Scores: Nutrition Assessments -  08/09/17 1415      MEDFICTS Scores   Pre Score  -- pt did not complete, sent home to bring back first day.        Nutrition Goals Re-Evaluation:   Nutrition Goals Discharge (Final Nutrition Goals Re-Evaluation):   Psychosocial: Target Goals: Acknowledge presence or absence of significant depression and/or stress, maximize coping skills, provide positive support system. Participant is able to verbalize types and ability to use techniques and skills needed for reducing stress and  depression.   Initial Review & Psychosocial Screening: Initial Psych Review & Screening - 08/09/17 1235      Initial Review   Current issues with  Current Stress Concerns    Source of Stress Concerns  Financial      Family Dynamics   Good Support System?  Yes      Barriers   Psychosocial barriers to participate in program  The patient should benefit from training in stress management and relaxation.;Psychosocial barriers identified (see note)      Screening Interventions   Interventions  Encouraged to exercise;Yes;Program counselor consult    Expected Outcomes  Short Term goal: Utilizing psychosocial counselor, staff and physician to assist with identification of specific Stressors or current issues interfering with healing process. Setting desired goal for each stressor or current issue identified.;Long Term Goal: Stressors or current issues are controlled or eliminated.       Quality of Life Scores:  Quality of Life - 08/09/17 1150      Quality of Life Scores   Health/Function Pre  24.8 %    Socioeconomic Pre  28.5 %    Psych/Spiritual Pre  30 %    Family Pre  27.6 %    GLOBAL Pre  27.09 %       PHQ-9: Recent Review Flowsheet Data    Depression screen [facility] 2/9 08/09/2017   Decreased Interest 0   Down, Depressed, Hopeless 0   PHQ - 2 Score 0   Altered sleeping 0   Tired, decreased energy 0   Change in appetite 0   Feeling bad or failure about yourself  0   Trouble concentrating 0   Moving slowly or fidgety/restless 0   Suicidal thoughts 0   PHQ-9 Score 0     Interpretation of Total Score  Total Score Depression Severity:  1-4 = Minimal depression, 5-9 = Mild depression, 10-14 = Moderate depression, 15-19 = Moderately severe depression, 20-27 = Severe depression   Psychosocial Evaluation and Intervention:   Psychosocial Re-Evaluation:   Psychosocial Discharge (Final Psychosocial Re-Evaluation):   Vocational Rehabilitation: Provide vocational rehab  assistance to qualifying candidates.   Vocational Rehab Evaluation & Intervention: Vocational Rehab - 08/09/17 1150      Initial Vocational Rehab Evaluation & Intervention   Assessment shows need for Vocational Rehabilitation  No       Education: Education Goals: Education classes will be provided on a variety of topics geared toward better understanding of heart health and risk factor modification. Participant will state understanding/return demonstration of topics presented as noted by education test scores.  Learning Barriers/Preferences: Learning Barriers/Preferences - 08/09/17 1231      Learning Barriers/Preferences   Learning Barriers  None    Learning Preferences  Individual Instruction       Education Topics: General Nutrition Guidelines/Fats and Fiber: -Group instruction provided by verbal, written material, models and posters to present the general guidelines for heart healthy nutrition. Gives an explanation and review of dietary fats and fiber.  Controlling Sodium/Reading Food Labels: -Group verbal and written material supporting the discussion of sodium use in heart healthy nutrition. Review and explanation with models, verbal and written materials for utilization of the food label.   Exercise Physiology & Risk Factors: - Group verbal and written instruction with models to review the exercise physiology of the cardiovascular system and associated critical values. Details cardiovascular disease risk factors and the goals associated with each risk factor.   Aerobic Exercise & Resistance Training: - Gives group verbal and written discussion on the health impact of inactivity. On the components of aerobic and resistive training programs and the benefits of this training and how to safely progress through these programs.   Flexibility, Balance, General Exercise Guidelines: - Provides group verbal and written instruction on the benefits of flexibility and balance  training programs. Provides general exercise guidelines with specific guidelines to those with heart or lung disease. Demonstration and skill practice provided.   Stress Management: - Provides group verbal and written instruction about the health risks of elevated stress, cause of high stress, and healthy ways to reduce stress.   Depression: - Provides group verbal and written instruction on the correlation between heart/lung disease and depressed mood, treatment options, and the stigmas associated with seeking treatment.   Anatomy & Physiology of the Heart: - Group verbal and written instruction and models provide basic cardiac anatomy and physiology, with the coronary electrical and arterial systems. Review of: AMI, Angina, Valve disease, Heart Failure, Cardiac Arrhythmia, Pacemakers, and the ICD.   Cardiac Procedures: - Group verbal and written instruction to review commonly prescribed medications for heart disease. Reviews the medication, class of the drug, and side effects. Includes the steps to properly store meds and maintain the prescription regimen. (beta blockers and nitrates)   Cardiac Medications I: - Group verbal and written instruction to review commonly prescribed medications for heart disease. Reviews the medication, class of the drug, and side effects. Includes the steps to properly store meds and maintain the prescription regimen.   Cardiac Medications II: -Group verbal and written instruction to review commonly prescribed medications for heart disease. Reviews the medication, class of the drug, and side effects. (all other drug classes)    Go Sex-Intimacy & Heart Disease, Get SMART - Goal Setting: - Group verbal and written instruction through game format to discuss heart disease and the return to sexual intimacy. Provides group verbal and written material to discuss and apply goal setting through the application of the S.M.A.R.T. Method.   Other Matters of the  Heart: - Provides group verbal, written materials and models to describe Heart Failure, Angina, Valve Disease, Peripheral Artery Disease, and Diabetes in the realm of heart disease. Includes description of the disease process and treatment options available to the cardiac patient.   Exercise & Equipment Safety: - Individual verbal instruction and demonstration of equipment use and safety with use of the equipment.   Cardiac Rehab from 08/09/2017 in [facility] Cardiac and Pulmonary Rehab  Date  08/09/17  Educator  C. Enterkin, RN  Instruction Review Code  3- Needs Reinforcement      Infection Prevention: - Provides verbal and written material to individual with discussion of infection control including proper hand washing and proper equipment cleaning during exercise session.   Cardiac Rehab from 08/09/2017 in [facility] Cardiac and Pulmonary Rehab  Date  08/09/17  Educator  C. EnterkinRN  Instruction Review Code  1- Verbalizes Understanding      Falls Prevention: - Provides verbal and written  material to individual with discussion of falls prevention and safety.   Cardiac Rehab from 08/09/2017 in [facility] Cardiac and Pulmonary Rehab  Date  08/09/17  Educator  C. Estill  Instruction Review Code  2- Demonstrated Understanding      Diabetes: - Individual verbal and written instruction to review signs/symptoms of diabetes, desired ranges of glucose level fasting, after meals and with exercise. Acknowledge that pre and post exercise glucose checks will be done for 3 sessions at entry of program.   Cardiac Rehab from 08/09/2017 in [facility] Cardiac and Pulmonary Rehab  Date  08/09/17  Educator  C. EnterkinRN  Instruction Review Code  1- Verbalizes Understanding      Other: -Provides group and verbal instruction on various topics (see comments)    Knowledge Questionnaire Score: Knowledge Questionnaire Score - 08/09/17 1152      Knowledge Questionnaire Score   Pre Score  13/28       Core  Components/Risk Factors/Patient Goals at Admission: Personal Goals and Risk Factors at Admission - 08/09/17 1233      Core Components/Risk Factors/Patient Goals on Admission    Weight Management  Yes;Weight Loss    Intervention  Weight Management: Develop a combined nutrition and exercise program designed to reach desired caloric intake, while maintaining appropriate intake of nutrient and fiber, sodium and fats, and appropriate energy expenditure required for the weight goal.;Weight Management: Provide education and appropriate resources to help participant work on and attain dietary goals.    Admit Weight  202 lb 6.1 oz (91.8 kg)    Goal Weight: Short Term  197 lb (89.4 kg)    Goal Weight: Long Term  192 lb (87.1 kg)    Expected Outcomes  Short Term: Continue to assess and modify interventions until short term weight is achieved;Long Term: Adherence to nutrition and physical activity/exercise program aimed toward attainment of established weight goal;Weight Loss: Understanding of general recommendations for a balanced deficit meal plan, which promotes 1-2 lb weight loss per week and includes a negative energy balance of [id number] kcal/d;Understanding recommendations for meals to include 15-35% energy as protein, 25-35% energy from fat, 35-60% energy from carbohydrates, less than 200mg  of dietary cholesterol, 20-35 gm of total fiber daily;Understanding of distribution of calorie intake throughout the day with the consumption of 4-5 meals/snacks    Tobacco Cessation  Yes    Number of packs per day  0.25    Intervention  -- Sufyaan said he will quit the first of Jan     Expected Outcomes  Short Term: Will demonstrate readiness to quit, by selecting a quit date.;Long Term: Complete abstinence from all tobacco products for at least 12 months from quit date.;Short Term: Will quit all tobacco product use, adhering to prevention of relapse plan.    Diabetes  Yes    Intervention  Provide education about  signs/symptoms and action to take for hypo/hyperglycemia.;Provide education about proper nutrition, including hydration, and aerobic/resistive exercise prescription along with prescribed medications to achieve blood glucose in normal ranges: Fasting glucose 65-99 mg/dL    Expected Outcomes  Short Term: Participant verbalizes understanding of the signs/symptoms and immediate care of hyper/hypoglycemia, proper foot care and importance of medication, aerobic/resistive exercise and nutrition plan for blood glucose control.;Long Term: Attainment of HbA1C < 7%.    Hypertension  Yes    Intervention  Provide education on lifestyle modifcations including regular physical activity/exercise, weight management, moderate sodium restriction and increased consumption of fresh fruit, vegetables, and low fat dairy, alcohol moderation,  and smoking cessation.;Monitor prescription use compliance.    Expected Outcomes  Short Term: Continued assessment and intervention until BP is < 140/66mm HG in hypertensive participants. < 130/4mm HG in hypertensive participants with diabetes, heart failure or chronic kidney disease.;Long Term: Maintenance of blood pressure at goal levels.    Stress  Yes    Intervention  Offer individual and/or small group education and counseling on adjustment to heart disease, stress management and health-related lifestyle change. Teach and support self-help strategies.;Refer participants experiencing significant psychosocial distress to appropriate mental health specialists for further evaluation and treatment. When possible, include family members and significant others in education/counseling sessions.    Expected Outcomes  Short Term: Participant demonstrates changes in health-related behavior, relaxation and other stress management skills, ability to obtain effective social support, and compliance with psychotropic medications if prescribed.;Long Term: Emotional wellbeing is indicated by absence of  clinically significant psychosocial distress or social isolation.       Core Components/Risk Factors/Patient Goals Review:    Core Components/Risk Factors/Patient Goals at Discharge (Final Review):    ITP Comments: ITP Comments    Row Name 08/09/17 1154 08/09/17 1233 08/22/17 1027       ITP Comments  ITP created during Medical Review after the Cardiac Rehab Informed consent was signed by Benedicto [name]. Diagnosis documented hospital discharge note 07/23/2017.  Aarish said his plan is to quit smoking the first of January since "I feel a lot better now that I have cut back smoking after my heart attack". 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 Girard reports that normally his HBA1c is 11 "since I have been eating bad lately but normally it is in the 7 range".   30 day review. Continue with ITP unless directed changes per Medical Director review.   HAs not attended since 08/09/2017        Comments:

## 2017-08-23 DIAGNOSIS — Z7984 Long term (current) use of oral hypoglycemic drugs: Secondary | ICD-10-CM | POA: Diagnosis not present

## 2017-08-23 DIAGNOSIS — Z7982 Long term (current) use of aspirin: Secondary | ICD-10-CM | POA: Diagnosis not present

## 2017-08-23 DIAGNOSIS — I5022 Chronic systolic (congestive) heart failure: Secondary | ICD-10-CM | POA: Diagnosis not present

## 2017-08-23 DIAGNOSIS — Z7902 Long term (current) use of antithrombotics/antiplatelets: Secondary | ICD-10-CM | POA: Diagnosis not present

## 2017-08-23 DIAGNOSIS — Z79899 Other long term (current) drug therapy: Secondary | ICD-10-CM | POA: Diagnosis not present

## 2017-08-23 DIAGNOSIS — I255 Ischemic cardiomyopathy: Secondary | ICD-10-CM | POA: Diagnosis not present

## 2017-08-23 DIAGNOSIS — F17210 Nicotine dependence, cigarettes, uncomplicated: Secondary | ICD-10-CM | POA: Diagnosis not present

## 2017-08-23 DIAGNOSIS — I2510 Atherosclerotic heart disease of native coronary artery without angina pectoris: Secondary | ICD-10-CM | POA: Diagnosis not present

## 2017-08-23 DIAGNOSIS — E119 Type 2 diabetes mellitus without complications: Secondary | ICD-10-CM | POA: Diagnosis not present

## 2017-08-23 DIAGNOSIS — E785 Hyperlipidemia, unspecified: Secondary | ICD-10-CM | POA: Diagnosis not present

## 2017-08-23 DIAGNOSIS — I213 ST elevation (STEMI) myocardial infarction of unspecified site: Secondary | ICD-10-CM | POA: Diagnosis not present

## 2017-08-23 LAB — GLUCOSE, CAPILLARY
GLUCOSE-CAPILLARY: 100 mg/dL — AB (ref 65–99)
Glucose-Capillary: 72 mg/dL (ref 65–99)

## 2017-08-23 NOTE — Progress Notes (Signed)
Incomplete Session Note  Patient Details  Name: Lawrence Holmes MRN: [id number] Date of Birth: 03-Sep-1955 Referring Provider:     Cardiac Rehab from 08/09/2017 in [facility] Cardiac and Pulmonary Rehab  Referring Provider  Lawrence Holmes did not complete his rehab session.  His Blood sugar was 72 he was given glucose gel which raised his level to 100. Patient was sent home with crackers. Patient voiced understanding and will come back on Monday.

## 2017-08-27 ENCOUNTER — Encounter: Payer: 59 | Admitting: *Deleted

## 2017-08-27 DIAGNOSIS — I213 ST elevation (STEMI) myocardial infarction of unspecified site: Secondary | ICD-10-CM

## 2017-08-27 LAB — GLUCOSE, CAPILLARY
GLUCOSE-CAPILLARY: 123 mg/dL — AB (ref 65–99)
GLUCOSE-CAPILLARY: 99 mg/dL (ref 65–99)

## 2017-08-27 NOTE — Progress Notes (Signed)
Daily Session Note  Patient Details  Name: Lawrence Holmes MRN: [id number] Date of Birth: 08-25-1955 Referring Provider:     Cardiac Rehab from 08/09/2017 in [facility] Cardiac and Pulmonary Rehab  Referring Provider  Arida      Encounter Date: 08/27/2017  Check In: Session Check In - 08/27/17 1733      Check-In   Location  [facility]-Cardiac & Pulmonary Rehab    Staff Present  Earlean Shawl, BS, ACSM CEP, Exercise Physiologist;Amanda Oletta Darter, BA, ACSM CEP, Exercise Physiologist;Carroll Enterkin, RN, BSN    Supervising physician immediately available to respond to emergencies  See telemetry face sheet for immediately available ER MD    Medication changes reported      No    Fall or balance concerns reported     No    Tobacco Cessation  No Change 10 cigarettes    Warm-up and Cool-down  Performed on first and last piece of equipment    Resistance Training Performed  Yes    VAD Patient?  No      Pain Assessment   Currently in Pain?  No/denies    Multiple Pain Sites  No          Social History   Tobacco Use  Smoking Status Current Every Day Smoker  . Packs/day: 0.25  Smokeless Tobacco Never Used    Goals Met:  Exercise tolerated well Personal goals reviewed No report of cardiac concerns or symptoms Strength training completed today  Goals Unmet:  Not Applicable  Comments: First full day of exercise!  Patient was oriented to gym and equipment including functions, settings, policies, and procedures.  Patient's individual exercise prescription and treatment plan were reviewed.  All starting workloads were established based on the results of the 6 minute walk test done at initial orientation visit.  The plan for exercise progression was also introduced and progression will be customized based on patient's performance and goals.    Dr. Emily Filbert is Medical Director for [place] and [facility].

## 2017-08-29 DIAGNOSIS — I213 ST elevation (STEMI) myocardial infarction of unspecified site: Secondary | ICD-10-CM | POA: Diagnosis not present

## 2017-08-29 LAB — GLUCOSE, CAPILLARY
GLUCOSE-CAPILLARY: 136 mg/dL — AB (ref 65–99)
GLUCOSE-CAPILLARY: 87 mg/dL (ref 65–99)

## 2017-08-29 NOTE — Progress Notes (Signed)
Daily Session Note  Patient Details  Name: Lawrence Holmes [place] MRN: [id number] Date of Birth: September 05, 1955 Referring Provider:     Cardiac Rehab from 08/09/2017 in [facility] Cardiac and Pulmonary Rehab  Referring Provider  Arida      Encounter Date: 08/29/2017  Check In: Session Check In - 08/29/17 1732      Check-In   Location  [facility]-Cardiac & Pulmonary Rehab    Staff Present  Renita Papa, RN Vickki Hearing, BA, ACSM CEP, Exercise Physiologist;Carroll Enterkin, RN, BSN    Supervising physician immediately available to respond to emergencies  See telemetry face sheet for immediately available ER MD    Medication changes reported      No    Fall or balance concerns reported     No    Tobacco Cessation  No Change    Warm-up and Cool-down  Performed on first and last piece of equipment    Resistance Training Performed  Yes    VAD Patient?  No      Pain Assessment   Currently in Pain?  No/denies    Multiple Pain Sites  No        Exercise Prescription Changes - 08/29/17 1200      Response to Exercise   Blood Pressure (Admit)  130/58    Blood Pressure (Exercise)  158/80    Blood Pressure (Exit)  138/62    Heart Rate (Admit)  88 bpm    Heart Rate (Exercise)  127 bpm    Heart Rate (Exit)  90 bpm    Rating of Perceived Exertion (Exercise)  11    Symptoms  none    Duration  Progress to 45 minutes of aerobic exercise without signs/symptoms of physical distress    Intensity  THRR unchanged      Progression   Progression  Continue to progress workloads to maintain intensity without signs/symptoms of physical distress.      Resistance Training   Training Prescription  Yes    Weight  4 lb    Reps  10-15      Recumbant Bike   Level  3    RPM  60    Minutes  15      T5 Nustep   Level  2    SPM  80    Minutes  15       Social History   Tobacco Use  Smoking Status Current Every Day Smoker  . Packs/day: 0.25  Smokeless Tobacco Never Used    Goals Met:  Independence with  exercise equipment Exercise tolerated well No report of cardiac concerns or symptoms Strength training completed today  Goals Unmet:  Not Applicable  Comments: Pt able to follow exercise prescription today without complaint.  Will continue to monitor for progression.    Dr. Emily Filbert is Medical Director for [place] and [facility].

## 2017-08-30 DIAGNOSIS — I213 ST elevation (STEMI) myocardial infarction of unspecified site: Secondary | ICD-10-CM

## 2017-08-30 LAB — GLUCOSE, CAPILLARY
Glucose-Capillary: 115 mg/dL — ABNORMAL HIGH (ref 65–99)
Glucose-Capillary: 84 mg/dL (ref 65–99)

## 2017-08-30 NOTE — Progress Notes (Signed)
Daily Session Note  Patient Details  Name: Lawrence Holmes MRN: [id number] Date of Birth: Dec 10, 1955 Referring Provider:     Cardiac Rehab from 08/09/2017 in [facility] Cardiac and Pulmonary Rehab  Referring Provider  Arida      Encounter Date: 08/30/2017  Check In: Session Check In - 08/30/17 1652      Check-In   Location  [facility]-Cardiac & Pulmonary Rehab    Staff Present  Renita Papa, RN Moises Blood, BS, ACSM CEP, Exercise Physiologist;Willadean Guyton Flavia Shipper    Supervising physician immediately available to respond to emergencies  See telemetry face sheet for immediately available ER MD    Medication changes reported      No    Fall or balance concerns reported     No    Tobacco Cessation  Use Decreased    Warm-up and Cool-down  Performed on first and last piece of equipment    Resistance Training Performed  Yes    VAD Patient?  No      Pain Assessment   Currently in Pain?  No/denies    Multiple Pain Sites  No          Social History   Tobacco Use  Smoking Status Current Every Day Smoker  . Packs/day: 0.30  Smokeless Tobacco Never Used  Tobacco Comment   8 cigs down from 10    Goals Met:  Independence with exercise equipment Exercise tolerated well No report of cardiac concerns or symptoms Strength training completed today  Goals Unmet:  Not Applicable  Comments: Pt able to follow exercise prescription today without complaint.  Will continue to monitor for progression.   Dr. Emily Filbert is Medical Director for [place] and [facility].

## 2017-09-03 DIAGNOSIS — I213 ST elevation (STEMI) myocardial infarction of unspecified site: Secondary | ICD-10-CM

## 2017-09-03 NOTE — Progress Notes (Signed)
Daily Session Note  Patient Details  Name: Lawrence [place] MRN: [id number] Date of Birth: 06-11-1956 Referring Provider:     Cardiac Rehab from 08/09/2017 in [facility] Cardiac and Pulmonary Rehab  Referring Provider  Arida      Encounter Date: 09/03/2017  Check In: Session Check In - 09/03/17 1722      Check-In   Location  [facility]-Cardiac & Pulmonary Rehab    Staff Present  Earlean Shawl, BS, ACSM CEP, Exercise Physiologist;Jaden Abreu Oletta Darter, BA, ACSM CEP, Exercise Physiologist;Carroll Enterkin, RN, BSN    Supervising physician immediately available to respond to emergencies  See telemetry face sheet for immediately available ER MD    Medication changes reported      No    Fall or balance concerns reported     No    Warm-up and Cool-down  Performed on first and last piece of equipment    Resistance Training Performed  Yes    VAD Patient?  No      Pain Assessment   Currently in Pain?  No/denies    Multiple Pain Sites  No          Social History   Tobacco Use  Smoking Status Current Every Day Smoker  . Packs/day: 0.30  Smokeless Tobacco Never Used  Tobacco Comment   8 cigs down from 10    Goals Met:  Independence with exercise equipment Exercise tolerated well No report of cardiac concerns or symptoms Strength training completed today  Goals Unmet:  Not Applicable  Comments: Pt able to follow exercise prescription today without complaint.  Will continue to monitor for progression.    Dr. Emily Filbert is Medical Director for [place] and [facility].

## 2017-09-04 ENCOUNTER — Encounter: Payer: Self-pay | Admitting: Nurse Practitioner

## 2017-09-04 ENCOUNTER — Ambulatory Visit (INDEPENDENT_AMBULATORY_CARE_PROVIDER_SITE_OTHER): Payer: 59 | Admitting: Nurse Practitioner

## 2017-09-04 VITALS — BP 138/72 | HR 83 | Ht 72.0 in | Wt 206.2 lb

## 2017-09-04 DIAGNOSIS — E782 Mixed hyperlipidemia: Secondary | ICD-10-CM

## 2017-09-04 DIAGNOSIS — I5022 Chronic systolic (congestive) heart failure: Secondary | ICD-10-CM | POA: Diagnosis not present

## 2017-09-04 DIAGNOSIS — I255 Ischemic cardiomyopathy: Secondary | ICD-10-CM | POA: Diagnosis not present

## 2017-09-04 DIAGNOSIS — I2510 Atherosclerotic heart disease of native coronary artery without angina pectoris: Secondary | ICD-10-CM

## 2017-09-04 DIAGNOSIS — Z720 Tobacco use: Secondary | ICD-10-CM

## 2017-09-04 MED ORDER — EPLERENONE 25 MG PO TABS: 25.0000 mg | ORAL_TABLET | Freq: Every day | ORAL | 6 refills | Status: DC

## 2017-09-04 NOTE — Progress Notes (Signed)
Office Visit    Patient Name: Lawrence Holmes Date of Encounter: 09/04/2017  Primary Care Provider:  Patient, No Pcp Per Primary Cardiologist:  Kathlyn Sacramento, MD  Chief Complaint    62 y/o ? with a h/o late presenting inferior STEMI/CAD in 07/2017 (med Rx), ICM/HfrEF(EF 25-35%), DMII, and tob abuse, who presents for f/u.   Past Medical History    Past Medical History:  Diagnosis Date  . Chronic systolic heart failure ([place]) 07/2017   a. 07/2017 Echo: EF 30-35%, Gr1 DD, inflat, and inf AK, mild to mod MR, mildly dil LA/RA.  [name] Coronary artery disease 07/2017   a. 07/2017 Late presenting inferior STEMI/Cath: LM nl, LAD min irregs, LCX 100d (L->L collats), RCA 100p (L->R collats), EF 25-35%-->Med Rx.  . Diabetes mellitus without complication ([place])   . Hyperlipidemia   . Ischemic cardiomyopathy    a. 07/2017 Echo: EF 30-35%.  . Tobacco use    Past Surgical History:  Procedure Laterality Date  . EYE SURGERY    . LEFT HEART CATH AND CORONARY ANGIOGRAPHY N/A 07/21/2017   Procedure: LEFT HEART CATH AND CORONARY ANGIOGRAPHY;  Surgeon: Wellington Hampshire, MD;  Location: [place] [facility];  Service: Cardiovascular;  Laterality: N/A;    Allergies  No Known Allergies  History of Present Illness    62 year old male with the above complex past medical history including coronary artery disease status post late presenting inferior ST segment elevation myocardial infarction in December 2018.  Catheterization revealed total occlusions of the distal left circumflex and proximal RCA.  EF was 25-35% by ventriculography and subsequently 30-35% by echocardiogram.  The culprit was felt to be the right coronary artery in the interventional team was not able to cross this with a wire.  Medical therapy was therefore recommended.  He last followed up in clinic in December and was doing well at that time.  He was transitioned from losartan to [facility].  Shortly thereafter, he noted some diarrhea but this  subsequently resolved and he has stayed on [place].  He is tolerated this well.  He continues to do well since his last visit and denies chest pain, dyspnea, palpitations, PND, orthopnea, dizziness, syncope, edema, or early satiety.  He is participating in cardiac rehab and also working on light duty.  He continues to smoke about 8 cigarettes/day and says he has no plans to purchase more cigarettes once he finishes what he has.  Home Medications    Prior to Admission medications   Medication Sig Start Date End Date Taking? Authorizing Provider  aspirin 81 MG chewable tablet Chew 1 tablet (81 mg total) by mouth daily. 07/24/17  Yes Demetrios Loll, MD  atorvastatin (LIPITOR) 80 MG tablet Take 1 tablet (80 mg total) by mouth daily at 6 PM. 07/23/17  Yes Demetrios Loll, MD  carvedilol (COREG) 3.125 MG tablet Take 1 tablet (3.125 mg total) by mouth 2 (two) times daily with a meal. 08/07/17  Yes Wellington Hampshire, MD  clopidogrel (PLAVIX) 75 MG tablet Take 1 tablet (75 mg total) by mouth daily with breakfast. 07/24/17  Yes Demetrios Loll, MD  metFORMIN (GLUCOPHAGE) 1000 MG tablet Take 1 tablet (1,000 mg total) by mouth 2 (two) times daily with a meal. 07/23/17  Yes Demetrios Loll, MD  nitroGLYCERIN (NITROSTAT) 0.4 MG SL tablet Place 1 tablet (0.4 mg total) under the tongue every 5 (five) minutes as needed for chest pain. 07/23/17  Yes Demetrios Loll, MD  sacubitril-valsartan (ENTRESTO) 24-26 MG Take  1 tablet by mouth 2 (two) times daily. 08/03/17  Yes Wellington Hampshire, MD  eplerenone (INSPRA) 25 MG tablet Take 1 tablet (25 mg total) by mouth daily. 09/04/17   Theora Gianotti, NP    Review of Systems    He denies chest pain, palpitations, dyspnea, pnd, orthopnea, n, v, dizziness, syncope, edema, weight gain, or early satiety.  All other systems reviewed and are otherwise negative except as noted above.  Physical Exam    VS:  BP 138/72 (BP Location: Left Arm, Patient Position: Sitting, Cuff Size: Normal)   Pulse 83    Ht 6' (1.829 m)   Wt 206 lb 4 oz (93.6 kg)   BMI 27.97 kg/m  , BMI Body mass index is 27.97 kg/m. GEN: Well nourished, well developed, in no acute distress.  HEENT: normal.  Neck: Supple, no JVD, carotid bruits, or masses. Cardiac: RRR, no murmurs, rubs, or gallops. No clubbing, cyanosis, edema.  Radials/DP/PT 2+ and equal bilaterally.  Respiratory:  Respirations regular and unlabored, clear to auscultation bilaterally. GI: Soft, nontender, nondistended, BS + x 4. MS: no deformity or atrophy. Skin: warm and dry, no rash. Neuro:  Strength and sensation are intact. Psych: Normal affect.  Accessory Clinical Findings    ECG -regular sinus rhythm, 83, left axis deviation, prior inferior and lateral infarcts, LVH, no acute changes.  Assessment & Plan    1.  Coronary artery disease: Status post late presenting inferior ST segment elevation myocardial infarction in early December 2018 with finding of total occlusions of the left circumflex and right coronary artery, with collaterals to each area.  He has been medically managed.  He has not been having any chest pain and is participating in cardiac rehabilitation.  He remains on aspirin, statin, beta-blocker, Plavix, and Entresto.  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  He will require treadmill testing prior to returning to driving.  We would also like to see improvement in LV dysfunction prior to that time.  2.  Ischemic cardiomyopathy/HFrEF: EF 30-35% by echocardiogram in December.  Euvolemic on exam.  He weighs himself 3 times a week and weights have been stable.  He remains on beta-blocker and Entresto therapy.  He is tolerating both.  Blood pressure is 138/72 today.  I will take this opportunity to add eplerenone 25 mg daily.  Plan to follow-up electrolytes and renal function next week.  We will also arrange for a follow-up echocardiogram in early March to reevaluate LV function on  medical therapy and determine candidacy for ICD therapy.  We discussed the importance of daily weights, sodium restriction, medication compliance, and symptom reporting and he verbalizes understanding.  3.  Hyperlipidemia: He remains on high potency statin therapy.  Follow-up lipids and LFTs next week.  4.  Tobacco abuse: He continues to smoke 8 cigarettes/day.  Cessation advised.  He says he has no plans to buy more cigarettes once he is out of his current supply.  5.  Disposition: Follow-up lipids, LFTs, electrolytes, and renal function in 1 week.  Follow-up echo in early March.  Follow-up with Dr. Fletcher Anon shortly thereafter.   Murray Hodgkins, NP 09/04/2017, 12:34 PM

## 2017-09-04 NOTE — Patient Instructions (Addendum)
Medication Instructions: - Your physician has recommended you make the following change in your medication:  1) START Inspra (eplerenone) 25 mg- take 1 tablet by mouth ONCE daily   Labwork: - Your physician recommends that you return for a:  FASTING lipid profile/ CMET in 1 week- [place] @ [facility]- 1st desk on the right  Procedures/Testing: - Your physician has requested that you have an echocardiogram- 1 st week of March. Echocardiography is a painless test that uses sound waves to create images of your heart. It provides your doctor with information about the size and shape of your heart and how well your heart's chambers and valves are working. This procedure takes approximately one hour. There are no restrictions for this procedure.  Follow-Up: - Your physician recommends that you schedule a follow-up appointment in: Dr. Fletcher Anon just after the echo is done in March.   Any Additional Special Instructions Will Be Listed Below (If Applicable).     If you need a refill on your cardiac medications before your next appointment, please call your pharmacy.

## 2017-09-06 DIAGNOSIS — I213 ST elevation (STEMI) myocardial infarction of unspecified site: Secondary | ICD-10-CM

## 2017-09-06 NOTE — Progress Notes (Signed)
Daily Session Note  Patient Details  Name: Lawrence Holmes MRN: [id number] Date of Birth: 28-Oct-1955 Referring Provider:     Cardiac Rehab from 08/09/2017 in [facility] Cardiac and Pulmonary Rehab  Referring Provider  Arida      Encounter Date: 09/06/2017  Check In: Session Check In - 09/06/17 1633      Check-In   Location  [facility]-Cardiac & Pulmonary Rehab    Staff Present  Earlean Shawl, BS, ACSM CEP, Exercise Physiologist;Meredith Sherryll Burger, RN BSN;Athziry Millican Flavia Shipper    Supervising physician immediately available to respond to emergencies  See telemetry face sheet for immediately available ER MD    Medication changes reported      No    Fall or balance concerns reported     No    Tobacco Cessation  Use Decreased down to 10 cigs a day    Warm-up and Cool-down  Performed on first and last piece of equipment    Resistance Training Performed  Yes    VAD Patient?  No      Pain Assessment   Currently in Pain?  No/denies          Social History   Tobacco Use  Smoking Status Current Every Day Smoker  . Packs/day: 0.50  Smokeless Tobacco Never Used    Goals Met:  Independence with exercise equipment Exercise tolerated well Personal goals reviewed No report of cardiac concerns or symptoms Strength training completed today  Goals Unmet:  Not Applicable  Comments: Pt able to follow exercise prescription today without complaint.  Will continue to monitor for progression.   Dr. Emily Filbert is Medical Director for [place] and [facility].

## 2017-09-10 DIAGNOSIS — I213 ST elevation (STEMI) myocardial infarction of unspecified site: Secondary | ICD-10-CM | POA: Diagnosis not present

## 2017-09-10 NOTE — Progress Notes (Signed)
Daily Session Note  Patient Details  Name: Lawrence Holmes MRN: [id number] Date of Birth: Nov 06, 1955 Referring Provider:     Cardiac Rehab from 08/09/2017 in [facility] Cardiac and Pulmonary Rehab  Referring Provider  Arida      Encounter Date: 09/10/2017  Check In: Session Check In - 09/10/17 1724      Check-In   Location  [facility]Cardiac [facility] Pulmonary Rehab    Staff Present  [place], BS, [place];Nada Maclachlan, BA, ACSM CEP, Exercise Physiologist;Carroll Enterkin, RN, BSN    Supervising physician immediately available to respond to emergencies  See telemetry face sheet for immediately available ER MD    Medication changes reported      No    Fall or balance concerns reported     No    Tobacco Cessation  No Change    Warm-up and Cool-down  Performed on first and last piece of equipment    Resistance Training Performed  Yes    VAD Patient?  No      Pain Assessment   Currently in Pain?  No/denies    Multiple Pain Sites  No          Social History   Tobacco Use  Smoking Status Current Every Day Smoker  . Packs/day: 0.50  . Types: Cigarettes  Smokeless Tobacco Never Used  Tobacco Comment   09/06/17 Tobacco cessation reviewed. Currently smoking about 10 cigs a day.    Goals Met:  Independence with exercise equipment Exercise tolerated well Personal goals reviewed No report of cardiac concerns or symptoms Strength training completed today  Goals Unmet:  Not Applicable  Comments: Pt able to follow exercise prescription today without complaint.  Will continue to monitor for progression.    Dr. Emily Filbert is Medical Director for [place] and [facility].

## 2017-09-11 ENCOUNTER — Encounter: Payer: Self-pay | Admitting: Cardiovascular Disease

## 2017-09-12 ENCOUNTER — Other Ambulatory Visit
Admission: RE | Admit: 2017-09-12 | Discharge: 2017-09-12 | Disposition: A | Discharge status: Home / Self Care | Payer: 59 | Source: Ambulatory Visit | Attending: Nurse Practitioner | Admitting: Nurse Practitioner

## 2017-09-12 DIAGNOSIS — E782 Mixed hyperlipidemia: Secondary | ICD-10-CM | POA: Diagnosis present

## 2017-09-12 DIAGNOSIS — I255 Ischemic cardiomyopathy: Secondary | ICD-10-CM | POA: Diagnosis not present

## 2017-09-12 DIAGNOSIS — I5022 Chronic systolic (congestive) heart failure: Secondary | ICD-10-CM | POA: Insufficient documentation

## 2017-09-12 DIAGNOSIS — I213 ST elevation (STEMI) myocardial infarction of unspecified site: Secondary | ICD-10-CM | POA: Diagnosis not present

## 2017-09-12 LAB — LIPID PANEL
CHOL/HDL RATIO: 3.7 ratio
CHOLESTEROL: 88 mg/dL (ref 0–200)
HDL: 24 mg/dL — AB (ref >40)
LDL Cholesterol: 51 mg/dL (ref 0–99)
Triglycerides: 63 mg/dL (ref <150)
VLDL: 13 mg/dL (ref 0–40)

## 2017-09-12 LAB — COMPREHENSIVE METABOLIC PANEL
ALBUMIN: 3.9 g/dL (ref 3.5–5.0)
ALK PHOS: 63 U/L (ref 38–126)
ALT: 24 U/L (ref 17–63)
ANION GAP: 8 (ref 5–15)
AST: 24 U/L (ref 15–41)
BILIRUBIN TOTAL: 0.6 mg/dL (ref 0.3–1.2)
BUN: 18 mg/dL (ref 6–20)
CO2: 25 mmol/L (ref 22–32)
CREATININE: 1.05 mg/dL (ref 0.61–1.24)
Calcium: 9 mg/dL (ref 8.9–10.3)
Chloride: 107 mmol/L (ref 101–111)
GLUCOSE: 94 mg/dL (ref 65–99)
Potassium: 3.6 mmol/L (ref 3.5–5.1)
Sodium: 140 mmol/L (ref 135–145)
Total Protein: 7.5 g/dL (ref 6.5–8.1)

## 2017-09-12 NOTE — Progress Notes (Signed)
Daily Session Note  Patient Details  Name: Lawrence Holmes MRN: [id number] Date of Birth: Dec 25, 1955 Referring Provider:     Cardiac Rehab from 08/09/2017 in [facility] Cardiac and Pulmonary Rehab  Referring Provider  Arida      Encounter Date: 09/12/2017  Check In: Session Check In - 09/12/17 1643      Check-In   Location  [facility]Cardiac [facility] Pulmonary Rehab    Staff Present  Joellyn Rued, BS, PEC;Meredith [place]iftwood, RN Vickki Hearing, BA, ACSM CEP, Exercise Physiologist    Supervising physician immediately available to respond to emergencies  See telemetry face sheet for immediately available ER MD    Medication changes reported      No    Fall or balance concerns reported     No    Warm-up and Cool-down  Performed on first and last piece of equipment    Resistance Training Performed  Yes    VAD Patient?  No      Pain Assessment   Currently in Pain?  No/denies    Multiple Pain Sites  No          Social History   Tobacco Use  Smoking Status Current Every Day Smoker  . Packs/day: 0.50  . Types: Cigarettes  Smokeless Tobacco Never Used  Tobacco Comment   09/06/17 Tobacco cessation reviewed. Currently smoking about 10 cigs a day.    Goals Met:  Independence with exercise equipment Exercise tolerated well No report of cardiac concerns or symptoms Strength training completed today  Goals Unmet:  Not Applicable  Comments: Pt able to follow exercise prescription today without complaint.  Will continue to monitor for progression.    Dr. Emily Filbert is Medical Director for [place] and [facility].

## 2017-09-17 ENCOUNTER — Encounter: Payer: 59 | Admitting: *Deleted

## 2017-09-17 DIAGNOSIS — I213 ST elevation (STEMI) myocardial infarction of unspecified site: Secondary | ICD-10-CM

## 2017-09-17 NOTE — Progress Notes (Signed)
Daily Session Note  Patient Details  Name: Lawrence Holmes MRN: [id number] Date of Birth: [birth year] Referring Provider:     Cardiac Rehab from 08/09/2017 in [facility] Cardiac and Pulmonary Rehab  Referring Provider  Arida      Encounter Date: 09/17/2017  Check In: Session Check In - 09/17/17 1632      Check-In   Location  [facility]    Staff Present  Earlean Shawl, BS, ACSM CEP, Exercise Physiologist;Amanda Oletta Darter, BA, ACSM CEP, Exercise Physiologist;Carroll Enterkin, RN, BSN    Supervising physician immediately available to respond to emergencies  See telemetry face sheet for immediately available ER MD    Medication changes reported      No    Fall or balance concerns reported     No    Tobacco Cessation  No Change    Warm-up and Cool-down  Performed on first and last piece of equipment    Resistance Training Performed  Yes    VAD Patient?  No      Pain Assessment   Currently in Pain?  No/denies    Multiple Pain Sites  No          Social History   Tobacco Use  Smoking Status Current Every Day Smoker  . Packs/day: 0.50  . Types: Cigarettes  Smokeless Tobacco Never Used  Tobacco Comment   09/06/17 Tobacco cessation reviewed. Currently smoking about 10 cigs a day.    Goals Met:  Independence with exercise equipment Exercise tolerated well No report of cardiac concerns or symptoms Strength training completed today  Goals Unmet:  Not Applicable  Comments: Pt able to follow exercise prescription today without complaint.  Will continue to monitor for progression.    Dr. Emily Filbert is Medical Director for [place] and [facility].

## 2017-09-19 ENCOUNTER — Encounter: Payer: Self-pay | Admitting: *Deleted

## 2017-09-19 DIAGNOSIS — I213 ST elevation (STEMI) myocardial infarction of unspecified site: Secondary | ICD-10-CM

## 2017-09-19 NOTE — Progress Notes (Signed)
Daily Session Note  Patient Details  Name: Lawrence [place] MRN: [id number] Date of Birth: 09-19-1955 Referring Provider:     Cardiac Rehab from 08/09/2017 in [facility] Cardiac and Pulmonary Rehab  Referring Provider  Arida      Encounter Date: 09/19/2017  Check In: Session Check In - 09/19/17 1711      Check-In   Location  [facility]iac [facility]monary Re[facility]    Staff Present  Renita Papa, RN Vickki Hearing, BA, ACSM CEP, Exercise Physiologist;Carroll Enterkin, RN, BSN    Supervising physician immediately available to respond to emergencies  See telemetry face sheet for immediately available ER MD    Medication changes reported      No    Fall or balance concerns reported     No    Warm-up and Cool-down  Performed on first and last piece of equipment    Resistance Training Performed  Yes    VAD Patient?  No      Pain Assessment   Currently in Pain?  No/denies          Social History   Tobacco Use  Smoking Status Current Every Day Smoker  . Packs/day: 0.50  . Types: Cigarettes  Smokeless Tobacco Never Used  Tobacco Comment   09/06/17 Tobacco cessation reviewed. Currently smoking about 10 cigs a day.    Goals Met:  Independence with exercise equipment Exercise tolerated well No report of cardiac concerns or symptoms Strength training completed today  Goals Unmet:  Not Applicable  Comments: Pt able to follow exercise prescription today without complaint.  Will continue to monitor for progression.    Dr. Emily Filbert is Medical Director for [place] and [facility].

## 2017-09-19 NOTE — Progress Notes (Signed)
Daily Session Note  Patient Details  Name: Lawrence Holmes MRN: [id number] Date of Birth: Aug 03, 1956 Referring Provider:     Cardiac Rehab from 08/09/2017 in [facility]  Referring Provider  Arida      Encounter Date: 09/19/2017  Check In: Session Check In - 09/19/17 1711      Check-In   Location  [facility]    Staff Present  Renita Papa, RN Vickki Hearing, BA, ACSM CEP, Exercise Physiologist;Carroll Enterkin, RN, BSN    Supervising physician immediately available to respond to emergencies  See telemetry face sheet for immediately available ER MD    Medication changes reported      No    Fall or balance concerns reported     No    Warm-up and Cool-down  Performed on first and last piece of equipment    Resistance Training Performed  Yes    VAD Patient?  No      Pain Assessment   Currently in Pain?  No/denies        Exercise Prescription Changes - 09/19/17 1700      Response to Exercise   Blood Pressure (Admit)  122/68    Blood Pressure (Exercise)  142/68    Blood Pressure (Exit)  114/66    Heart Rate (Admit)  98 bpm    Heart Rate (Exercise)  120 bpm    Heart Rate (Exit)  87 bpm    Rating of Perceived Exertion (Exercise)  12    Symptoms  none    Duration  Continue with 45 min of aerobic exercise without signs/symptoms of physical distress.    Intensity  THRR unchanged      Progression   Progression  Continue to progress workloads to maintain intensity without signs/symptoms of physical distress.      Resistance Training   Training Prescription  Yes    Weight  4 lb    Reps  10-15      Treadmill   MPH  2.2    Grade  1    Minutes  15    METs  3      T5 Nustep   Level  2    SPM  80    Minutes  15    METs  2.1      Home Exercise Plan   Plans to continue exercise at  [employer] (comment)    Frequency  Add 1 additional day to program exercise sessions.    Initial Home Exercises Provided  09/19/17        Social History   Tobacco Use  Smoking Status Current Every Day Smoker  . Packs/day: 0.50  . Types: Cigarettes  Smokeless Tobacco Never Used  Tobacco Comment   09/06/17 Tobacco cessation reviewed. Currently smoking about 10 cigs a day.    Goals Met:  Independence with exercise equipment Exercise tolerated well No report of cardiac concerns or symptoms Strength training completed today  Goals Unmet:  Not Applicable  Comments: Reviewed home exercise with pt today.  Pt plans to consider joining a gym for exercise.  Reviewed THR, pulse, RPE, sign and symptoms, NTG use, and when to call 911 or MD.  Also discussed weather considerations and indoor options.  Pt voiced understanding.   Dr. Emily Filbert is Medical Director for [facility] and [facility].

## 2017-09-19 NOTE — Progress Notes (Signed)
Cardiac Individual Treatment Plan  Patient Details  Name: Lawrence Holmes MRN: [id number] Date of Birth: July 12, 1956 Referring Provider:     Cardiac Rehab from 08/09/2017 in [facility] Cardiac and Pulmonary Rehab  Referring Provider  Arida      Initial Encounter Date:    Cardiac Rehab from 08/09/2017 in [facility] Cardiac and Pulmonary Rehab  Date  08/09/17  Referring Provider  Fletcher Anon      Visit Diagnosis: ST elevation myocardial infarction (STEMI), unspecified artery ([place])  Patient's Home Medications on Admission:  Current Outpatient Medications:  .  aspirin 81 MG chewable tablet, Chew 1 tablet (81 mg total) by mouth daily., Disp: 30 tablet, Rfl: 2 .  atorvastatin (LIPITOR) 80 MG tablet, Take 1 tablet (80 mg total) by mouth daily at 6 PM., Disp: 30 tablet, Rfl: 2 .  carvedilol (COREG) 3.125 MG tablet, Take 1 tablet (3.125 mg total) by mouth 2 (two) times daily with a meal., Disp: 60 tablet, Rfl: 1 .  clopidogrel (PLAVIX) 75 MG tablet, Take 1 tablet (75 mg total) by mouth daily with breakfast., Disp: 30 tablet, Rfl: 2 .  eplerenone (INSPRA) 25 MG tablet, Take 1 tablet (25 mg total) by mouth daily., Disp: 30 tablet, Rfl: 6 .  metFORMIN (GLUCOPHAGE) 1000 MG tablet, Take 1 tablet (1,000 mg total) by mouth 2 (two) times daily with a meal., Disp: 60 tablet, Rfl: 1 .  nitroGLYCERIN (NITROSTAT) 0.4 MG SL tablet, Place 1 tablet (0.4 mg total) under the tongue every 5 (five) minutes as needed for chest pain., Disp: 30 tablet, Rfl: 2 .  sacubitril-valsartan (ENTRESTO) 24-26 MG, Take 1 tablet by mouth 2 (two) times daily., Disp: 180 tablet, Rfl: 3  Past Medical History: Past Medical History:  Diagnosis Date  . Chronic systolic heart failure ([place]) 07/2017   a. 07/2017 Echo: EF 30-35%, Gr1 DD, inflat, and inf AK, mild to mod MR, mildly dil LA/RA.  [name] Coronary artery disease 07/2017   a. 07/2017 Late presenting inferior STEMI/Cath: LM nl, LAD min irregs, LCX 100d (L->L collats), RCA 100p (L->R collats), EF  25-35%-->Med Rx.  . Diabetes mellitus without complication ([place])   . Hyperlipidemia   . Ischemic cardiomyopathy    a. 07/2017 Echo: EF 30-35%.  . Tobacco use     Tobacco Use: Social History   Tobacco Use  Smoking Status Current Every Day Smoker  . Packs/day: 0.50  . Types: Cigarettes  Smokeless Tobacco Never Used  Tobacco Comment   09/06/17 Tobacco cessation reviewed. Currently smoking about 10 cigs a day.    Labs: Recent Review Flowsheet Data    Labs for ITP Cardiac and Pulmonary Rehab Latest Ref Rng & Units 07/21/2017 09/12/2017   Cholestrol 0 - 200 mg/dL - 88   LDLCALC 0 - 99 mg/dL - 51   HDL >40 mg/dL - 24(L)   Trlycerides <150 mg/dL - 63   Hemoglobin A1c 4.8 - 5.6 % 11.4(H) -       Exercise Target Goals:    Exercise Program Goal: Individual exercise prescription set using results from initial 6 min walk test and THRR while considering  patient's activity barriers and safety.   Exercise Prescription Goal: Initial exercise prescription builds to 30-45 minutes a day of aerobic activity, 2-3 days per week.  Home exercise guidelines will be given to patient during program as part of exercise prescription that the participant will acknowledge.  Activity Barriers & Risk Stratification: Activity Barriers & Cardiac Risk Stratification - 08/09/17 1232  Activity Barriers & Cardiac Risk Stratification   Activity Barriers  None    Cardiac Risk Stratification  High       6 Minute Walk: 6 Minute Walk    Row Name 08/09/17 1341         6 Minute Walk   Distance  1200 feet     Walk Time  6 minutes     # of Rest Breaks  0     MPH  2.27     METS  3.18     RPE  13     Perceived Dyspnea   2     VO2 Peak  11.12     Symptoms  No     Resting HR  88 bpm     Resting BP  98/56     Exercise Oxygen Saturation  during 6 min walk  98 %     Max Ex. HR  120 bpm     Max Ex. BP  102/58     2 Minute Post BP  92/48        Oxygen Initial Assessment:   Oxygen  Re-Evaluation:   Oxygen Discharge (Final Oxygen Re-Evaluation):   Initial Exercise Prescription: Initial Exercise Prescription - 08/09/17 1300      Date of Initial Exercise RX and Referring Provider   Date  08/09/17    Referring Provider  Arida      Treadmill   MPH  2.2    Grade  1    Minutes  15    METs  3      Recumbant Bike   Level  3    RPM  60    Watts  34    Minutes  15    METs  3      REL-XR   Level  3    Speed  50    Minutes  15      T5 Nustep   Level  2    SPM  80    Minutes  15    METs  3      Prescription Details   Frequency (times per week)  3    Duration  Progress to 45 minutes of aerobic exercise without signs/symptoms of physical distress      Intensity   THRR 40-80% of Max Heartrate  116-144    Ratings of Perceived Exertion  11-13    Perceived Dyspnea  0-4      Resistance Training   Training Prescription  Yes    Weight  4lb    Reps  10-15       Perform Capillary Blood Glucose checks as needed.  Exercise Prescription Changes: Exercise Prescription Changes    Row Name 08/29/17 1200 09/11/17 1500           Response to Exercise   Blood Pressure (Admit)  130/58  122/68      Blood Pressure (Exercise)  158/80  142/68      Blood Pressure (Exit)  138/62  114/66      Heart Rate (Admit)  88 bpm  98 bpm      Heart Rate (Exercise)  127 bpm  120 bpm      Heart Rate (Exit)  90 bpm  87 bpm      Rating of Perceived Exertion (Exercise)  11  12      Symptoms  none  none      Duration  Progress to 45 minutes of aerobic exercise  without signs/symptoms of physical distress  Continue with 45 min of aerobic exercise without signs/symptoms of physical distress.      Intensity  THRR unchanged  THRR unchanged        Progression   Progression  Continue to progress workloads to maintain intensity without signs/symptoms of physical distress.  Continue to progress workloads to maintain intensity without signs/symptoms of physical distress.         Resistance Training   Training Prescription  Yes  Yes      Weight  4 lb  4 lb      Reps  10-15  10-15        Treadmill   MPH  -  2.2      Grade  -  1      Minutes  -  15      METs  -  3        Recumbant Bike   Level  3  -      RPM  60  -      Minutes  15  -        T5 Nustep   Level  2  2      SPM  80  80      Minutes  15  15      METs  -  2.1         Exercise Comments: Exercise Comments    Row Name 08/27/17 1741           Exercise Comments  First full day of exercise!  Patient was oriented to gym and equipment including functions, settings, policies, and procedures.  Patient's individual exercise prescription and treatment plan were reviewed.  All starting workloads were established based on the results of the 6 minute walk test done at initial orientation visit.  The plan for exercise progression was also introduced and progression will be customized based on patient's performance and goals          Exercise Goals and Review: Exercise Goals    Row Name 08/09/17 1340             Exercise Goals   Increase Physical Activity  Yes       Intervention  Provide advice, education, support and counseling about physical activity/exercise needs.;Develop an individualized exercise prescription for aerobic and resistive training based on initial evaluation findings, risk stratification, comorbidities and participant's personal goals.       Expected Outcomes  Achievement of increased cardiorespiratory fitness and enhanced flexibility, muscular endurance and strength shown through measurements of functional capacity and personal statement of participant.       Increase Strength and Stamina  Yes       Intervention  Provide advice, education, support and counseling about physical activity/exercise needs.;Develop an individualized exercise prescription for aerobic and resistive training based on initial evaluation findings, risk stratification, comorbidities and participant's personal  goals.       Expected Outcomes  Achievement of increased cardiorespiratory fitness and enhanced flexibility, muscular endurance and strength shown through measurements of functional capacity and personal statement of participant.       Able to understand and use rate of perceived exertion (RPE) scale  Yes       Intervention  Provide education and explanation on how to use RPE scale       Expected Outcomes  Short Term: Able to use RPE daily in rehab to express subjective intensity level;Long Term:  Able to use RPE  to guide intensity level when exercising independently       Able to understand and use Dyspnea scale  Yes       Intervention  Provide education and explanation on how to use Dyspnea scale       Expected Outcomes  Short Term: Able to use Dyspnea scale daily in rehab to express subjective sense of shortness of breath during exertion;Long Term: Able to use Dyspnea scale to guide intensity level when exercising independently       Knowledge and understanding of Target Heart Rate Range (THRR)  Yes       Intervention  Provide education and explanation of THRR including how the numbers were predicted and where they are located for reference       Expected Outcomes  Short Term: Able to state/look up THRR;Long Term: Able to use THRR to govern intensity when exercising independently;Short Term: Able to use daily as guideline for intensity in rehab       Able to check pulse independently  Yes       Intervention  Provide education and demonstration on how to check pulse in carotid and radial arteries.;Review the importance of being able to check your own pulse for safety during independent exercise       Expected Outcomes  Short Term: Able to explain why pulse checking is important during independent exercise;Long Term: Able to check pulse independently and accurately       Understanding of Exercise Prescription  Yes       Intervention  Provide education, explanation, and written materials on patient's  individual exercise prescription       Expected Outcomes  Short Term: Able to explain program exercise prescription;Long Term: Able to explain home exercise prescription to exercise independently          Exercise Goals Re-Evaluation : Exercise Goals Re-Evaluation    Row Name 09/06/17 1635 09/11/17 1526           Exercise Goal Re-Evaluation   Exercise Goals Review  Increase Physical Activity;Increase Strength and Stamina;Knowledge and understanding of Target Heart Rate Range (THRR);Understanding of Exercise Prescription;Able to check pulse independently  Increase Physical Activity;Able to understand and use rate of perceived exertion (RPE) scale;Increase Strength and Stamina;Knowledge and understanding of Target Heart Rate Range (THRR)      Comments  Edwing stated that he felt he was getting stronger and making progress with his exercise routine.   Amauri is progressing well with exercise.  He is still working on readiness to quit smoking.      Expected Outcomes  Short: Attend [facility] regularly Long: Adopt a healthy exercise and physical activity routine to do indpendently.   Short - Yutaka will continue to exercise at [facility] Long - Abdel will exercise independently         Discharge Exercise Prescription (Final Exercise Prescription Changes): Exercise Prescription Changes - 09/11/17 1500      Response to Exercise   Blood Pressure (Admit)  122/68    Blood Pressure (Exercise)  142/68    Blood Pressure (Exit)  114/66    Heart Rate (Admit)  98 bpm    Heart Rate (Exercise)  120 bpm    Heart Rate (Exit)  87 bpm    Rating of Perceived Exertion (Exercise)  12    Symptoms  none    Duration  Continue with 45 min of aerobic exercise without signs/symptoms of physical distress.    Intensity  THRR unchanged      Progression  Progression  Continue to progress workloads to maintain intensity without signs/symptoms of physical distress.      Resistance Training   Training Prescription  Yes    Weight  4  lb    Reps  10-15      Treadmill   MPH  2.2    Grade  1    Minutes  15    METs  3      T5 Nustep   Level  2    SPM  80    Minutes  15    METs  2.1       Nutrition:  Target Goals: Understanding of nutrition guidelines, daily intake of sodium '1500mg'$ , cholesterol '200mg'$ , calories 30% from fat and 7% or less from saturated fats, daily to have 5 or more servings of fruits and vegetables.  Biometrics: Pre Biometrics - 08/09/17 1340      Pre Biometrics   Height  6' (1.829 m)    Weight  202 lb 4.8 oz (91.8 kg)    Waist Circumference  40 inches    Hip Circumference  42 inches    Waist to Hip Ratio  0.95 %    BMI (Calculated)  27.43    Single Leg Stand  21.42 seconds        Nutrition Therapy Plan and Nutrition Goals: Nutrition Therapy & Goals - 09/10/17 1726      Nutrition Therapy   RD appointment deferred  Yes       Nutrition Assessments: Nutrition Assessments - 08/09/17 1415      MEDFICTS Scores   Pre Score  -- pt did not complete, sent home to bring back first day.        Nutrition Goals Re-Evaluation: Nutrition Goals Re-Evaluation    [place] Name 09/06/17 1632             Goals   Comment  Xzavior is currently not interested in meeting with the dietician idividulally, but does want to listen to the group lectures on nutrician when they rotate though the group education cycle.        Expected Outcome  Short: Eythan will be present at group nutician lecture. Long: Erubiel will use knowledge gained to adopt a heart healthy eating plan.           Nutrition Goals Discharge (Final Nutrition Goals Re-Evaluation): Nutrition Goals Re-Evaluation - 09/06/17 1632      Goals   Comment  Anant is currently not interested in meeting with the dietician idividulally, but does want to listen to the group lectures on nutrician when they rotate though the group education cycle.     Expected Outcome  Short: Tyrone will be present at group nutician lecture. Long: Cassady will use knowledge gained to  adopt a heart healthy eating plan.        Psychosocial: Target Goals: Acknowledge presence or absence of significant depression and/or stress, maximize coping skills, provide positive support system. Participant is able to verbalize types and ability to use techniques and skills needed for reducing stress and depression.   Initial Review & Psychosocial Screening: Initial Psych Review & Screening - 08/09/17 1235      Initial Review   Current issues with  Current Stress Concerns    Source of Stress Concerns  Financial      Family Dynamics   Good Support System?  Yes      Barriers   Psychosocial barriers to participate in program  The patient should benefit from training  in stress management and relaxation.;Psychosocial barriers identified (see note)      Screening Interventions   Interventions  Encouraged to exercise;Yes;Program counselor consult    Expected Outcomes  Short Term goal: Utilizing psychosocial counselor, staff and physician to assist with identification of specific Stressors or current issues interfering with healing process. Setting desired goal for each stressor or current issue identified.;Long Term Goal: Stressors or current issues are controlled or eliminated.       Quality of Life Scores:  Quality of Life - 08/09/17 1150      Quality of Life Scores   Health/Function Pre  24.8 %    Socioeconomic Pre  28.5 %    Psych/Spiritual Pre  30 %    Family Pre  27.6 %    GLOBAL Pre  27.09 %      Scores of 19 and below usually indicate a poorer quality of life in these areas.  A difference of  2-3 points is a clinically meaningful difference.  A difference of 2-3 points in the total score of the Quality of Life Index has been associated with significant improvement in overall quality of life, self-image, physical symptoms, and general health in studies assessing change in quality of life.  PHQ-9: Recent Review Flowsheet Data    Depression screen [facility] 2/9 08/09/2017    Decreased Interest 0   Down, Depressed, Hopeless 0   PHQ - 2 Score 0   Altered sleeping 0   Tired, decreased energy 0   Change in appetite 0   Feeling bad or failure about yourself  0   Trouble concentrating 0   Moving slowly or fidgety/restless 0   Suicidal thoughts 0   PHQ-9 Score 0     Interpretation of Total Score  Total Score Depression Severity:  1-4 = Minimal depression, 5-9 = Mild depression, 10-14 = Moderate depression, 15-19 = Moderately severe depression, 20-27 = Severe depression   Psychosocial Evaluation and Intervention: Psychosocial Evaluation - 08/27/17 1732      Psychosocial Evaluation & Interventions   Interventions  Encouraged to exercise with the program and follow exercise prescription;Stress management education    Comments  Counselor met with [name]  [facility]) today for initial psychosocial evaluation.  He is a 62 year old who had a heart attack on 12/1.  He lives alone but has a strong wupport system with a sister and brother who live locally and Mykal is actively involved in his local church.  Taevin considers himself pretty healthy except for this cardiac incident and he has diabetes.  He sleeps well and has a good appetite.  He denies a history of depression or anxiety and reports minimal stress in his life other than not being able to return to work yet. His goals are to increase his stamina and strength while in this program.  Saharsh is a positive person and appears to be very well-adjusted.  Staff will follow with him throughout the course of this program.    Expected Outcomes  Arrington will benefit from consistent exercise to achieve his stated goals.  The educational and psychoeducational components of this program will help him learn more about his condition and positive ways to cope with it.      Continue Psychosocial Services   Follow up required by staff       Psychosocial Re-Evaluation: Psychosocial Re-Evaluation    [place] Name 09/10/17 1726              Psychosocial Re-Evaluation  Current issues with  Current Stress Concerns       Comments  Pt. has returned to work but doesn't feel like it has added stress.           Psychosocial Discharge (Final Psychosocial Re-Evaluation): Psychosocial Re-Evaluation - 09/10/17 1726      Psychosocial Re-Evaluation   Current issues with  Current Stress Concerns    Comments  Pt. has returned to work but doesn't feel like it has added stress.        Vocational Rehabilitation: Provide vocational rehab assistance to qualifying candidates.   Vocational Rehab Evaluation & Intervention: Vocational Rehab - 08/09/17 1150      Initial Vocational Rehab Evaluation & Intervention   Assessment shows need for Vocational Rehabilitation  No       Education: Education Goals: Education classes will be provided on a variety of topics geared toward better understanding of heart health and risk factor modification. Participant will state understanding/return demonstration of topics presented as noted by education test scores.  Learning Barriers/Preferences: Learning Barriers/Preferences - 08/09/17 1231      Learning Barriers/Preferences   Learning Barriers  None    Learning Preferences  Individual Instruction       Education Topics:  AED/CPR: - Group verbal and written instruction with the use of models to demonstrate the basic use of the AED with the basic ABC's of resuscitation.   General Nutrition Guidelines/Fats and Fiber: -Group instruction provided by verbal, written material, models and posters to present the general guidelines for heart healthy nutrition. Gives an explanation and review of dietary fats and fiber.   Cardiac Rehab from 09/17/2017 in [facility] Cardiac and Pulmonary Rehab  Date  09/17/17  Educator  PI  Instruction Review Code  1- Verbalizes Understanding      Controlling Sodium/Reading Food Labels: -Group verbal and written material supporting the discussion of sodium use in heart  healthy nutrition. Review and explanation with models, verbal and written materials for utilization of the food label.   Exercise Physiology & General Exercise Guidelines: - Group verbal and written instruction with models to review the exercise physiology of the cardiovascular system and associated critical values. Provides general exercise guidelines with specific guidelines to those with heart or lung disease.    Aerobic Exercise & Resistance Training: - Gives group verbal and written instruction on the various components of exercise. Focuses on aerobic and resistive training programs and the benefits of this training and how to safely progress through these programs..   Flexibility, Balance, Mind/Body Relaxation: Provides group verbal/written instruction on the benefits of flexibility and balance training, including mind/body exercise modes such as yoga, pilates and tai chi.  Demonstration and skill practice provided.   Cardiac Rehab from 09/17/2017 in [facility] Cardiac and Pulmonary Rehab  Date  08/27/17  Educator  AS  Instruction Review Code  1- Verbalizes Understanding      Stress and Anxiety: - Provides group verbal and written instruction about the health risks of elevated stress and causes of high stress.  Discuss the correlation between heart/lung disease and anxiety and treatment options. Review healthy ways to manage with stress and anxiety.   Cardiac Rehab from 09/17/2017 in [facility] Cardiac and Pulmonary Rehab  Date  09/12/17  Educator  [facility]  Instruction Review Code  1- Verbalizes Understanding      Depression: - Provides group verbal and written instruction on the correlation between heart/lung disease and depressed mood, treatment options, and the stigmas associated with seeking treatment.  Anatomy & Physiology of the Heart: - Group verbal and written instruction and models provide basic cardiac anatomy and physiology, with the coronary electrical and arterial systems. Review  of Valvular disease and Heart Failure   Cardiac Rehab from 09/17/2017 in [facility] Cardiac and Pulmonary Rehab  Date  09/10/17  Educator  CE  Instruction Review Code  1- Verbalizes Understanding      Cardiac Procedures: - Group verbal and written instruction to review commonly prescribed medications for heart disease. Reviews the medication, class of the drug, and side effects. Includes the steps to properly store meds and maintain the prescription regimen. (beta blockers and nitrates)   Cardiac Medications I: - Group verbal and written instruction to review commonly prescribed medications for heart disease. Reviews the medication, class of the drug, and side effects. Includes the steps to properly store meds and maintain the prescription regimen.   Cardiac Rehab from 09/17/2017 in [facility] Cardiac and Pulmonary Rehab  Date  09/03/17  Educator  [facility]  Instruction Review Code  1- Verbalizes Understanding      Cardiac Medications II: -Group verbal and written instruction to review commonly prescribed medications for heart disease. Reviews the medication, class of the drug, and side effects. (all other drug classes)   Cardiac Rehab from 09/17/2017 in [facility] Cardiac and Pulmonary Rehab  Date  08/29/17  Educator  [facility]  Instruction Review Code  1- Verbalizes Understanding       Go Sex-Intimacy & Heart Disease, Get SMART - Goal Setting: - Group verbal and written instruction through game format to discuss heart disease and the return to sexual intimacy. Provides group verbal and written material to discuss and apply goal setting through the application of the S.M.A.R.T. Method.   Other Matters of the Heart: - Provides group verbal, written materials and models to describe Stable Angina and Peripheral Artery. Includes description of the disease process and treatment options available to the cardiac patient.   Exercise & Equipment Safety: - Individual verbal instruction and demonstration of equipment use  and safety with use of the equipment.   Cardiac Rehab from 09/17/2017 in [facility] Cardiac and Pulmonary Rehab  Date  08/09/17  Educator  C. Enterkin, RN  Instruction Review Code  3- Needs Reinforcement      Infection Prevention: - Provides verbal and written material to individual with discussion of infection control including proper hand washing and proper equipment cleaning during exercise session.   Cardiac Rehab from 09/17/2017 in [facility] Cardiac and Pulmonary Rehab  Date  08/09/17  Educator  C. EnterkinRN  Instruction Review Code  1- Verbalizes Understanding      Falls Prevention: - Provides verbal and written material to individual with discussion of falls prevention and safety.   Cardiac Rehab from 09/17/2017 in [facility] Cardiac and Pulmonary Rehab  Date  08/09/17  Educator  C. Hamburg  Instruction Review Code  2- Demonstrated Understanding      Diabetes: - Individual verbal and written instruction to review signs/symptoms of diabetes, desired ranges of glucose level fasting, after meals and with exercise. Acknowledge that pre and post exercise glucose checks will be done for 3 sessions at entry of program.   Cardiac Rehab from 09/17/2017 in [facility] Cardiac and Pulmonary Rehab  Date  08/09/17  Educator  C. EnterkinRN  Instruction Review Code  1- Verbalizes Understanding      Know Your Numbers and Risk Factors: -Group verbal and written instruction about important numbers in your health.  Discussion of what are risk factors  and how they play a role in the disease process.  Review of Cholesterol, Blood Pressure, Diabetes, and BMI and the role they play in your overall health.   Cardiac Rehab from 09/17/2017 in [facility] Cardiac and Pulmonary Rehab  Date  08/29/17  Educator  [facility]  Instruction Review Code  1- Verbalizes Understanding      Sleep Hygiene: -Provides group verbal and written instruction about how sleep can affect your health.  Define sleep hygiene, discuss sleep cycles and  impact of sleep habits. Review good sleep hygiene tips.    Other: -Provides group and verbal instruction on various topics (see comments)   Knowledge Questionnaire Score: Knowledge Questionnaire Score - 08/09/17 1152      Knowledge Questionnaire Score   Pre Score  13/28       Core Components/Risk Factors/Patient Goals at Admission: Personal Goals and Risk Factors at Admission - 08/09/17 1233      Core Components/Risk Factors/Patient Goals on Admission    Weight Management  Yes;Weight Loss    Intervention  Weight Management: Develop a combined nutrition and exercise program designed to reach desired caloric intake, while maintaining appropriate intake of nutrient and fiber, sodium and fats, and appropriate energy expenditure required for the weight goal.;Weight Management: Provide education and appropriate resources to help participant work on and attain dietary goals.    Admit Weight  202 lb 6.1 oz (91.8 kg)    Goal Weight: Short Term  197 lb (89.4 kg)    Goal Weight: Long Term  192 lb (87.1 kg)    Expected Outcomes  Short Term: Continue to assess and modify interventions until short term weight is achieved;Long Term: Adherence to nutrition and physical activity/exercise program aimed toward attainment of established weight goal;Weight Loss: Understanding of general recommendations for a balanced deficit meal plan, which promotes 1-2 lb weight loss per week and includes a negative energy balance of [phone] kcal/d;Understanding recommendations for meals to include 15-35% energy as protein, 25-35% energy from fat, 35-60% energy from carbohydrates, less than '200mg'$  of dietary cholesterol, 20-35 gm of total fiber daily;Understanding of distribution of calorie intake throughout the day with the consumption of 4-5 meals/snacks    Tobacco Cessation  Yes    Number of packs per day  0.25    Intervention  -- Azavion said he will quit the first of Jan     Expected Outcomes  Short Term: Will demonstrate  readiness to quit, by selecting a quit date.;Long Term: Complete abstinence from all tobacco products for at least 12 months from quit date.;Short Term: Will quit all tobacco product use, adhering to prevention of relapse plan.    Diabetes  Yes    Intervention  Provide education about signs/symptoms and action to take for hypo/hyperglycemia.;Provide education about proper nutrition, including hydration, and aerobic/resistive exercise prescription along with prescribed medications to achieve blood glucose in normal ranges: Fasting glucose 65-99 mg/dL    Expected Outcomes  Short Term: Participant verbalizes understanding of the signs/symptoms and immediate care of hyper/hypoglycemia, proper foot care and importance of medication, aerobic/resistive exercise and nutrition plan for blood glucose control.;Long Term: Attainment of HbA1C < 7%.    Hypertension  Yes    Intervention  Provide education on lifestyle modifcations including regular physical activity/exercise, weight management, moderate sodium restriction and increased consumption of fresh fruit, vegetables, and low fat dairy, alcohol moderation, and smoking cessation.;Monitor prescription use compliance.    Expected Outcomes  Short Term: Continued assessment and intervention until BP is < 140/66m HG  in hypertensive participants. < 130/16m HG in hypertensive participants with diabetes, heart failure or chronic kidney disease.;Long Term: Maintenance of blood pressure at goal levels.    Stress  Yes    Intervention  Offer individual and/or small group education and counseling on adjustment to heart disease, stress management and health-related lifestyle change. Teach and support self-help strategies.;Refer participants experiencing significant psychosocial distress to appropriate mental health specialists for further evaluation and treatment. When possible, include family members and significant others in education/counseling sessions.    Expected Outcomes   Short Term: Participant demonstrates changes in health-related behavior, relaxation and other stress management skills, ability to obtain effective social support, and compliance with psychotropic medications if prescribed.;Long Term: Emotional wellbeing is indicated by absence of clinically significant psychosocial distress or social isolation.       Core Components/Risk Factors/Patient Goals Review:  Goals and Risk Factor Review    Row Name 09/06/17 1628 09/10/17 1653           Core Components/Risk Factors/Patient Goals Review   Personal Goals Review  Weight Management/Obesity;Tobacco Cessation;Diabetes;Hypertension;Lipids  Tobacco Cessation;Hypertension;Lipids;Diabetes      Review  Adarius stated that he is trying to maintain weight and gain strength after losing 10 lbs when sick. He reports blood sugar, lipids, and blood pressure have all been managed and he is taking all meds. He is still smoking about 10 cigarettes a day, which is about a pack less then he used to smoke.   Tobacco use is about the same.  He has info about the [employer]  His Dr appt this week is Wed.  Fasting BG has been 98.  He is taking meds as prescribed for BP and lipids  He would like to get back to 215 lb .He feels he is getting stronger "one lb at a time"      Expected Outcomes  Short: Tanush will have updated lab work next week to evaluate where his numbers currently stand. Long: Gain strength and maintain weight through exercise program and decrease number of cigarettes smoked per day.   Short - Ashwath will get results of current bloodwork and follow Dr instructions.  He is not ready to quit smoking at this time.  Long - Gabrial will reach weight and strength/stamina goals.         Core Components/Risk Factors/Patient Goals at Discharge (Final Review):  Goals and Risk Factor Review - 09/10/17 1653      Core Components/Risk Factors/Patient Goals Review   Personal Goals Review  Tobacco  Cessation;Hypertension;Lipids;Diabetes    Review  Tobacco use is about the same.  He has info about the [employer]  His Dr appt this week is Wed.  Fasting BG has been 98.  He is taking meds as prescribed for BP and lipids  He would like to get back to 215 lb .He feels he is getting stronger "one lb at a time"    Expected Outcomes  Short - Endre will get results of current bloodwork and follow Dr instructions.  He is not ready to quit smoking at this time.  Long - Simuel will reach weight and strength/stamina goals.       ITP Comments: ITP Comments    Row Name 08/09/17 1154 08/09/17 1233 08/22/17 1027 09/19/17 0636     ITP Comments  ITP created during Medical Review after the Cardiac Rehab Informed consent was signed by Petar ICosta Holmes Diagnosis documented hospital discharge note 07/23/2017.  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 Roverto reports that normally his HBA1c is 11 "since I have been eating bad lately but normally it is in the 7 range".   30 day review. Continue with ITP unless directed changes per Medical Director review.   HAs not attended since 08/09/2017  30 Day review. Continue with ITP unless directed changes per Medical Director review.        Comments:

## 2017-09-20 DIAGNOSIS — I213 ST elevation (STEMI) myocardial infarction of unspecified site: Secondary | ICD-10-CM

## 2017-09-20 NOTE — Progress Notes (Signed)
Daily Session Note  Patient Details  Name: Lawrence Holmes MRN: [id number] Date of Birth: February 04, 1956 Referring Provider:     Cardiac Rehab from 08/09/2017 in [facility] Cardiac and Pulmonary Rehab  Referring Provider  Arida      Encounter Date: 09/20/2017  Check In: Session Check In - 09/20/17 1723      Check-In   Location  [facility]-Cardiac & Pulmonary Rehab    Staff Present  Earlean Shawl, BS, ACSM CEP, Exercise Physiologist;Meredith Sherryll Burger, RN BSN;Milcah Dulany Flavia Shipper    Supervising physician immediately available to respond to emergencies  See telemetry face sheet for immediately available ER MD    Medication changes reported      No    Fall or balance concerns reported     No    Warm-up and Cool-down  Performed on first and last piece of equipment    Resistance Training Performed  Yes    VAD Patient?  No      Pain Assessment   Currently in Pain?  No/denies          Social History   Tobacco Use  Smoking Status Current Every Day Smoker  . Packs/day: 0.50  . Types: Cigarettes  Smokeless Tobacco Never Used  Tobacco Comment   09/06/17 Tobacco cessation reviewed. Currently smoking about 10 cigs a day.    Goals Met:  Independence with exercise equipment Exercise tolerated well No report of cardiac concerns or symptoms Strength training completed today  Goals Unmet:  Not Applicable  Comments: Pt able to follow exercise prescription today without complaint.  Will continue to monitor for progression.    Dr. Emily Filbert is Medical Director for [place] and [facility].

## 2017-09-24 ENCOUNTER — Encounter: Payer: Commercial Managed Care - HMO | Attending: Cardiovascular Disease | Admitting: *Deleted

## 2017-09-24 DIAGNOSIS — E119 Type 2 diabetes mellitus without complications: Secondary | ICD-10-CM | POA: Insufficient documentation

## 2017-09-24 DIAGNOSIS — F17210 Nicotine dependence, cigarettes, uncomplicated: Secondary | ICD-10-CM | POA: Diagnosis not present

## 2017-09-24 DIAGNOSIS — Z79899 Other long term (current) drug therapy: Secondary | ICD-10-CM | POA: Diagnosis not present

## 2017-09-24 DIAGNOSIS — E785 Hyperlipidemia, unspecified: Secondary | ICD-10-CM | POA: Insufficient documentation

## 2017-09-24 DIAGNOSIS — Z7982 Long term (current) use of aspirin: Secondary | ICD-10-CM | POA: Insufficient documentation

## 2017-09-24 DIAGNOSIS — Z7902 Long term (current) use of antithrombotics/antiplatelets: Secondary | ICD-10-CM | POA: Insufficient documentation

## 2017-09-24 DIAGNOSIS — I5022 Chronic systolic (congestive) heart failure: Secondary | ICD-10-CM | POA: Diagnosis not present

## 2017-09-24 DIAGNOSIS — I2510 Atherosclerotic heart disease of native coronary artery without angina pectoris: Secondary | ICD-10-CM | POA: Insufficient documentation

## 2017-09-24 DIAGNOSIS — I213 ST elevation (STEMI) myocardial infarction of unspecified site: Secondary | ICD-10-CM | POA: Diagnosis not present

## 2017-09-24 DIAGNOSIS — I255 Ischemic cardiomyopathy: Secondary | ICD-10-CM | POA: Insufficient documentation

## 2017-09-24 DIAGNOSIS — Z7984 Long term (current) use of oral hypoglycemic drugs: Secondary | ICD-10-CM | POA: Diagnosis not present

## 2017-09-24 NOTE — Progress Notes (Signed)
Daily Session Note  Patient Details  Name: Lawrence Holmes MRN: [id number] Date of Birth: 02/15/1956 Referring Provider:     Cardiac Rehab from 08/09/2017 in [facility] Cardiac and Pulmonary Rehab  Referring Provider  Arida      Encounter Date: 09/24/2017  Check In: Session Check In - 09/24/17 1641      Check-In   Location  [facility]-Cardiac & Pulmonary Rehab    Staff Present  Earlean Shawl, BS, ACSM CEP, Exercise Physiologist;Amanda Oletta Darter, BA, ACSM CEP, Exercise Physiologist;Carroll Enterkin, RN, BSN    Supervising physician immediately available to respond to emergencies  See telemetry face sheet for immediately available ER MD    Medication changes reported      No    Fall or balance concerns reported     No    Warm-up and Cool-down  Performed on first and last piece of equipment    Resistance Training Performed  Yes    VAD Patient?  No      Pain Assessment   Currently in Pain?  No/denies    Multiple Pain Sites  No          Social History   Tobacco Use  Smoking Status Current Every Day Smoker  . Packs/day: 0.50  . Types: Cigarettes  Smokeless Tobacco Never Used  Tobacco Comment   09/06/17 Tobacco cessation reviewed. Currently smoking about 10 cigs a day.    Goals Met:  Independence with exercise equipment Exercise tolerated well No report of cardiac concerns or symptoms Strength training completed today  Goals Unmet:  Not Applicable  Comments: Pt able to follow exercise prescription today without complaint.  Will continue to monitor for progression.    Dr. Emily Filbert is Medical Director for [place] and [facility].

## 2017-09-26 ENCOUNTER — Encounter: Payer: Commercial Managed Care - HMO | Admitting: *Deleted

## 2017-09-26 DIAGNOSIS — I213 ST elevation (STEMI) myocardial infarction of unspecified site: Secondary | ICD-10-CM

## 2017-09-26 NOTE — Progress Notes (Signed)
Daily Session Note  Patient Details  Name: Lawrence Holmes MRN: [id number] Date of Birth: 09-08-1955 Referring Provider:     Cardiac Rehab from 08/09/2017 in [facility] Cardiac and Pulmonary Rehab  Referring Provider  Arida      Encounter Date: 09/26/2017  Check In: Session Check In - 09/26/17 1635      Check-In   Location  [facility]-Cardiac & Pulmonary Rehab    Staff Present  Renita Papa, RN Vickki Hearing, BA, ACSM CEP, Exercise Physiologist;Carroll Enterkin, RN, BSN    Supervising physician immediately available to respond to emergencies  See telemetry face sheet for immediately available ER MD    Medication changes reported      No    Fall or balance concerns reported     No    Warm-up and Cool-down  Performed on first and last piece of equipment    Resistance Training Performed  Yes    VAD Patient?  No      Pain Assessment   Currently in Pain?  No/denies        Exercise Prescription Changes - 09/26/17 1200      Response to Exercise   Blood Pressure (Admit)  132/64    Blood Pressure (Exercise)  140/80    Blood Pressure (Exit)  102/60    Heart Rate (Admit)  80 bpm    Heart Rate (Exercise)  125 bpm    Heart Rate (Exit)  891 bpm    Rating of Perceived Exertion (Exercise)  12    Symptoms  none    Duration  Continue with 45 min of aerobic exercise without signs/symptoms of physical distress.    Intensity  THRR unchanged      Progression   Progression  Continue to progress workloads to maintain intensity without signs/symptoms of physical distress.    Average METs  3      Resistance Training   Training Prescription  Yes    Weight  4 lb    Reps  10-15      Interval Training   Interval Training  No      Treadmill   MPH  2.2    Grade  1.5    Minutes  15    METs  3      REL-XR   Level  3    Speed  50    Minutes  2.9      Home Exercise Plan   Plans to continue exercise at  [facility] (comment)    Frequency  Add 1 additional day to program exercise  sessions.    Initial Home Exercises Provided  09/19/17       Social History   Tobacco Use  Smoking Status Current Every Day Smoker  . Packs/day: 0.50  . Types: Cigarettes  Smokeless Tobacco Never Used  Tobacco Comment   09/06/17 Tobacco cessation reviewed. Currently smoking about 10 cigs a day.    Goals Met:  Independence with exercise equipment Exercise tolerated well No report of cardiac concerns or symptoms Strength training completed today  Goals Unmet:  Not Applicable  Comments: Pt able to follow exercise prescription today without complaint.  Will continue to monitor for progression.    Dr. Emily Filbert is Medical Director for [place] and [facility].

## 2017-09-27 DIAGNOSIS — I213 ST elevation (STEMI) myocardial infarction of unspecified site: Secondary | ICD-10-CM | POA: Diagnosis not present

## 2017-09-27 NOTE — Progress Notes (Signed)
Daily Session Note  Patient Details  Name: Lawrence Holmes [place] MRN: [id number] Date of Birth: September 24, 1955 Referring Provider:     Cardiac Rehab from 08/09/2017 in [facility] Cardiac and Pulmonary Rehab  Referring Provider  Arida      Encounter Date: 09/27/2017  Check In: Session Check In - 09/27/17 1647      Check-In   Location  [facility]-Cardiac & Pulmonary Rehab    Staff Present  Earlean Shawl, BS, ACSM CEP, Exercise Physiologist;Meredith Sherryll Burger, RN BSN;Shamya Macfadden Flavia Shipper    Supervising physician immediately available to respond to emergencies  See telemetry face sheet for immediately available ER MD    Medication changes reported      No    Fall or balance concerns reported     No    Tobacco Cessation  No Change 4 cigarettes a day    Warm-up and Cool-down  Performed on first and last piece of equipment    Resistance Training Performed  Yes    VAD Patient?  No      Pain Assessment   Currently in Pain?  No/denies          Social History   Tobacco Use  Smoking Status Current Every Day Smoker  . Packs/day: 0.50  . Types: Cigarettes  Smokeless Tobacco Never Used  Tobacco Comment   09/06/17 Tobacco cessation reviewed. Currently smoking about 10 cigs a day.    Goals Met:  Independence with exercise equipment Exercise tolerated well No report of cardiac concerns or symptoms Strength training completed today  Goals Unmet:  Not Applicable  Comments: Pt able to follow exercise prescription today without complaint.  Will continue to monitor for progression.   Dr. Emily Filbert is Medical Director for [place] and [facility].

## 2017-10-01 ENCOUNTER — Encounter: Payer: Commercial Managed Care - HMO | Admitting: *Deleted

## 2017-10-01 DIAGNOSIS — I213 ST elevation (STEMI) myocardial infarction of unspecified site: Secondary | ICD-10-CM | POA: Diagnosis not present

## 2017-10-01 NOTE — Progress Notes (Signed)
Daily Session Note  Patient Details  Name: Lawrence Holmes MRN: [id number] Date of Birth: August 29, 1955 Referring Provider:     Cardiac Rehab from 08/09/2017 in [facility] Cardiac and Pulmonary Rehab  Referring Provider  Arida      Encounter Date: 10/01/2017  Check In: Session Check In - 10/01/17 1726      Check-In   Location  [facility]-Cardiac & Pulmonary Rehab    Staff Present  Nyoka Cowden, RN, BSN, [place], BS, ACSM CEP, Exercise Physiologist;Amanda Oletta Darter, [place], ACSM CEP, Exercise Physiologist;Joseph Flavia Shipper    Supervising physician immediately available to respond to emergencies  See telemetry face sheet for immediately available ER MD    Medication changes reported      No    Fall or balance concerns reported     No    Tobacco Cessation  No Change    Warm-up and Cool-down  Performed on first and last piece of equipment    Resistance Training Performed  Yes    VAD Patient?  No      Pain Assessment   Currently in Pain?  No/denies    Multiple Pain Sites  No          Social History   Tobacco Use  Smoking Status Current Every Day Smoker  . Packs/day: 0.50  . Types: Cigarettes  Smokeless Tobacco Never Used  Tobacco Comment   09/06/17 Tobacco cessation reviewed. Currently smoking about 10 cigs a day.    Goals Met:  Independence with exercise equipment Exercise tolerated well No report of cardiac concerns or symptoms Strength training completed today  Goals Unmet:  Not Applicable  Comments: Pt able to follow exercise prescription today without complaint.  Will continue to monitor for progression.    Dr. Emily Filbert is Medical Director for [place] and [facility].

## 2017-10-03 ENCOUNTER — Encounter: Payer: Commercial Managed Care - HMO | Admitting: *Deleted

## 2017-10-03 DIAGNOSIS — I213 ST elevation (STEMI) myocardial infarction of unspecified site: Secondary | ICD-10-CM | POA: Diagnosis not present

## 2017-10-03 NOTE — Progress Notes (Signed)
Daily Session Note  Patient Details  Name: Lawrence [place] MRN: [id number] Date of Birth: 16-Jan-1956 Referring Provider:     Cardiac Rehab from 08/09/2017 in [facility] Cardiac and Pulmonary Rehab  Referring Provider  Arida      Encounter Date: 10/03/2017  Check In: Session Check In - 10/03/17 1652      Check-In   Location  [facility]-Cardiac & Pulmonary Rehab    Staff Present  Renita Papa, RN Vickki Hearing, BA, ACSM CEP, Exercise Physiologist;Carroll Enterkin, RN, BSN    Supervising physician immediately available to respond to emergencies  See telemetry face sheet for immediately available ER MD    Medication changes reported      No    Fall or balance concerns reported     No    Warm-up and Cool-down  Performed on first and last piece of equipment    Resistance Training Performed  Yes    VAD Patient?  No      Pain Assessment   Currently in Pain?  No/denies          Social History   Tobacco Use  Smoking Status Current Every Day Smoker  . Packs/day: 0.50  . Types: Cigarettes  Smokeless Tobacco Never Used  Tobacco Comment   09/06/17 Tobacco cessation reviewed. Currently smoking about 10 cigs a day.    Goals Met:  Independence with exercise equipment Exercise tolerated well No report of cardiac concerns or symptoms Strength training completed today  Goals Unmet:  Not Applicable  Comments: Pt able to follow exercise prescription today without complaint.  Will continue to monitor for progression.    Dr. Emily Filbert is Medical Director for [place] and [facility].

## 2017-10-04 DIAGNOSIS — I213 ST elevation (STEMI) myocardial infarction of unspecified site: Secondary | ICD-10-CM | POA: Diagnosis not present

## 2017-10-04 NOTE — Progress Notes (Signed)
Daily Session Note  Patient Details  Name: Lawrence Holmes MRN: [id number] Date of Birth: 03-13-1956 Referring Provider:     Cardiac Rehab from 08/09/2017 in [facility] Cardiac and Pulmonary Rehab  Referring Provider  Arida      Encounter Date: 10/04/2017  Check In: Session Check In - 10/04/17 1702      Check-In   Location  [facility]-Cardiac & Pulmonary Rehab    Staff Present  Earlean Shawl, BS, ACSM CEP, Exercise Physiologist;Meredith Sherryll Burger, RN BSN;Joseph Flavia Shipper    Supervising physician immediately available to respond to emergencies  See telemetry face sheet for immediately available ER MD    Medication changes reported      No    Fall or balance concerns reported     No    Warm-up and Cool-down  Performed on first and last piece of equipment    Resistance Training Performed  Yes    VAD Patient?  No      Pain Assessment   Currently in Pain?  No/denies          Social History   Tobacco Use  Smoking Status Current Every Day Smoker  . Packs/day: 0.50  . Types: Cigarettes  Smokeless Tobacco Never Used  Tobacco Comment   09/06/17 Tobacco cessation reviewed. Currently smoking about 10 cigs a day.    Goals Met:  Independence with exercise equipment Exercise tolerated well No report of cardiac concerns or symptoms Strength training completed today  Goals Unmet:  Not Applicable  Comments: Pt able to follow exercise prescription today without complaint.  Will continue to monitor for progression.   Dr. Emily Filbert is Medical Director for [place] and [facility].

## 2017-10-08 DIAGNOSIS — I213 ST elevation (STEMI) myocardial infarction of unspecified site: Secondary | ICD-10-CM | POA: Diagnosis not present

## 2017-10-08 NOTE — Progress Notes (Signed)
Daily Session Note  Patient Details  Name: Lawrence Holmes MRN: [id number] Date of Birth: [birth year] Referring Provider:     Cardiac Rehab from 08/09/2017 in [facility] Cardiac and Pulmonary Rehab  Referring Provider  Arida      Encounter Date: 10/08/2017  Check In: Session Check In - 10/08/17 1632      Check-In   Location  [facility]-Cardiac & Pulmonary Rehab    Staff Present  Earlean Shawl, BS, ACSM CEP, Exercise Physiologist;Amanda Oletta Darter, BA, ACSM CEP, Exercise Physiologist;Carroll Enterkin, RN, BSN    Supervising physician immediately available to respond to emergencies  See telemetry face sheet for immediately available ER MD    Medication changes reported      No    Fall or balance concerns reported     No    Warm-up and Cool-down  Performed on first and last piece of equipment    Resistance Training Performed  Yes    VAD Patient?  No      Pain Assessment   Currently in Pain?  No/denies    Multiple Pain Sites  No          Social History   Tobacco Use  Smoking Status Current Every Day Smoker  . Packs/day: 0.50  . Types: Cigarettes  Smokeless Tobacco Never Used  Tobacco Comment   09/06/17 Tobacco cessation reviewed. Currently smoking about 10 cigs a day.    Goals Met:  Independence with exercise equipment Exercise tolerated well Personal goals reviewed No report of cardiac concerns or symptoms Strength training completed today  Goals Unmet:  Not Applicable  Comments: Pt able to follow exercise prescription today without complaint.  Will continue to monitor for progression. [place] Name 08/09/17 1341 10/08/17 1737       6 Minute Walk   Phase  -  Discharge    Distance  1200 feet  1290 feet    Distance % Change  -  7.5 %    Distance Feet Change  -  90 ft    Walk Time  6 minutes  6 minutes    # of Rest Breaks  0  0    MPH  2.27  2.44    METS  3.18  3.75    RPE  13  13    Perceived Dyspnea   2  -    VO2 Peak  11.12  13.12    Symptoms  No  No    Resting HR  88 bpm  87 bpm    Resting BP  98/56  130/82    Exercise Oxygen Saturation  during 6 min walk  98 %  -    Max Ex. HR  120 bpm  118 bpm    Max Ex. BP  102/58  160/80    2 Minute Post BP  92/48  -         Dr. Emily Filbert is Medical Director for [place] and [facility].

## 2017-10-10 ENCOUNTER — Encounter: Payer: Commercial Managed Care - HMO | Admitting: *Deleted

## 2017-10-10 DIAGNOSIS — I213 ST elevation (STEMI) myocardial infarction of unspecified site: Secondary | ICD-10-CM

## 2017-10-10 NOTE — Progress Notes (Signed)
Daily Session Note  Patient Details  Name: Lawrence Holmes MRN: [id number] Date of Birth: April 05, 1956 Referring Provider:     Cardiac Rehab from 08/09/2017 in [facility] Cardiac and Pulmonary Rehab  Referring Provider  Arida      Encounter Date: 10/10/2017  Check In: Session Check In - 10/10/17 1642      Check-In   Location  [facility]-Cardiac & Pulmonary Rehab    Staff Present  Renita Papa, RN Vickki Hearing, BA, ACSM CEP, Exercise Physiologist;Carroll Enterkin, RN, BSN    Supervising physician immediately available to respond to emergencies  See telemetry face sheet for immediately available ER MD    Medication changes reported      No    Fall or balance concerns reported     No    Tobacco Cessation  No Change    Warm-up and Cool-down  Performed on first and last piece of equipment    Resistance Training Performed  Yes    VAD Patient?  No      Pain Assessment   Currently in Pain?  No/denies          Social History   Tobacco Use  Smoking Status Current Every Day Smoker  . Packs/day: 0.50  . Types: Cigarettes  Smokeless Tobacco Never Used  Tobacco Comment   09/06/17 Tobacco cessation reviewed. Currently smoking about 10 cigs a day.    Goals Met:  Independence with exercise equipment Exercise tolerated well No report of cardiac concerns or symptoms Strength training completed today  Goals Unmet:  Not Applicable  Comments: Pt able to follow exercise prescription today without complaint.  Will continue to monitor for progression.    Dr. Emily Filbert is Medical Director for [place] and [facility].

## 2017-10-11 ENCOUNTER — Encounter: Payer: Commercial Managed Care - HMO | Admitting: *Deleted

## 2017-10-11 DIAGNOSIS — I213 ST elevation (STEMI) myocardial infarction of unspecified site: Secondary | ICD-10-CM | POA: Diagnosis not present

## 2017-10-11 NOTE — Progress Notes (Signed)
Daily Session Note  Patient Details  Name: Lawrence Holmes [place] MRN: [id number] Date of Birth: [birth year] Referring Provider:     Cardiac Rehab from 08/09/2017 in [facility] Cardiac and Pulmonary Rehab  Referring Provider  Arida      Encounter Date: 10/11/2017  Check In: Session Check In - 10/11/17 1641      Check-In   Staff Present  Renita Papa, RN Moises Blood, BS, ACSM CEP, Exercise Physiologist;Amanda Oletta Darter, [place], ACSM CEP, Exercise Physiologist;Joseph Flavia Shipper    Supervising physician immediately available to respond to emergencies  See telemetry face sheet for immediately available ER MD    Medication changes reported      No    Fall or balance concerns reported     No    Tobacco Cessation  No Change    Warm-up and Cool-down  Performed on first and last piece of equipment    Resistance Training Performed  Yes    VAD Patient?  No      Pain Assessment   Currently in Pain?  No/denies          Social History   Tobacco Use  Smoking Status Current Every Day Smoker  . Packs/day: 0.50  . Types: Cigarettes  Smokeless Tobacco Never Used  Tobacco Comment   09/06/17 Tobacco cessation reviewed. Currently smoking about 10 cigs a day.    Goals Met:  Exercise tolerated well No report of cardiac concerns or symptoms Strength training completed today  Goals Unmet:  Not Applicable  Comments: Doing well with exercise prescription progression.    Dr. Emily Filbert is Medical Director for [place] and [facility].

## 2017-10-15 ENCOUNTER — Encounter: Payer: Commercial Managed Care - HMO | Admitting: *Deleted

## 2017-10-15 DIAGNOSIS — I213 ST elevation (STEMI) myocardial infarction of unspecified site: Secondary | ICD-10-CM | POA: Diagnosis not present

## 2017-10-15 NOTE — Progress Notes (Signed)
Daily Session Note  Patient Details  Name: Lawrence Holmes MRN: [id number] Date of Birth: 07-12-1956 Referring Provider:     Cardiac Rehab from 08/09/2017 in [facility] Cardiac and Pulmonary Rehab  Referring Provider  Arida      Encounter Date: 10/15/2017  Check In: Session Check In - 10/15/17 1713      Check-In   Location  [facility]-Cardiac & Pulmonary Rehab    Staff Present  Nada Maclachlan, BA, ACSM CEP, Exercise Physiologist;Eron Staat Amedeo Plenty, BS, ACSM CEP, Exercise Physiologist;Meredith Sherryll Burger, RN BSN;Susanne Bice, RN, BSN, Florestine Avers, RN BSN    Supervising physician immediately available to respond to emergencies  See telemetry face sheet for immediately available ER MD    Medication changes reported      No    Fall or balance concerns reported     No    Warm-up and Cool-down  Performed on first and last piece of equipment    Resistance Training Performed  Yes    VAD Patient?  No      Pain Assessment   Currently in Pain?  No/denies    Multiple Pain Sites  No          Social History   Tobacco Use  Smoking Status Current Every Day Smoker  . Packs/day: 0.50  . Types: Cigarettes  Smokeless Tobacco Never Used  Tobacco Comment   09/06/17 Tobacco cessation reviewed. Currently smoking about 10 cigs a day.    Goals Met:  Independence with exercise equipment Exercise tolerated well No report of cardiac concerns or symptoms Strength training completed today  Goals Unmet:  Not Applicable  Comments: Pt able to follow exercise prescription today without complaint.  Will continue to monitor for progression.    Dr. Emily Filbert is Medical Director for [place] and [facility].

## 2017-10-16 NOTE — Patient Instructions (Signed)
Discharge Patient Instructions  Patient Details  Name: Lawrence Holmes MRN: [id number] Date of Birth: Aug 22, 1955 Referring Provider:  Wellington Hampshire, MD   Number of Visits: 12  Reason for Discharge:  Patient reached a stable level of exercise. Patient independent in their exercise. Patient has met program and personal goals.  Smoking History:  Social History   Tobacco Use  Smoking Status Current Every Day Smoker  . Packs/day: 0.50  . Types: Cigarettes  Smokeless Tobacco Never Used  Tobacco Comment   09/06/17 Tobacco cessation reviewed. Currently smoking about 10 cigs a day.    Diagnosis:  ST elevation myocardial infarction (STEMI), unspecified artery [facility])  Initial Exercise Prescription: Initial Exercise Prescription - 08/09/17 1300      Date of Initial Exercise RX and Referring Provider   Date  08/09/17    Referring Provider  Arida      Treadmill   MPH  2.2    Grade  1    Minutes  15    METs  3      Recumbant Bike   Level  3    RPM  60    Watts  34    Minutes  15    METs  3      REL-XR   Level  3    Speed  50    Minutes  15      T5 Nustep   Level  2    SPM  80    Minutes  15    METs  3      Prescription Details   Frequency (times per week)  3    Duration  Progress to 45 minutes of aerobic exercise without signs/symptoms of physical distress      Intensity   THRR 40-80% of Max Heartrate  116-144    Ratings of Perceived Exertion  11-13    Perceived Dyspnea  0-4      Resistance Training   Training Prescription  Yes    Weight  4lb    Reps  10-15       Discharge Exercise Prescription (Final Exercise Prescription Changes): Exercise Prescription Changes - 10/09/17 1100      Response to Exercise   Blood Pressure (Admit)  130/82    Blood Pressure (Exercise)  160/80    Blood Pressure (Exit)  120/68    Heart Rate (Admit)  80 bpm    Heart Rate (Exercise)  128 bpm    Heart Rate (Exit)  87 bpm    Rating of Perceived Exertion (Exercise)  13    Symptoms  none    Duration  Continue with 45 min of aerobic exercise without signs/symptoms of physical distress.    Intensity  THRR unchanged      Progression   Progression  Continue to progress workloads to maintain intensity without signs/symptoms of physical distress.    Average METs  3.75 post walk      Resistance Training   Training Prescription  No    Weight  4 lb    Reps  10-15      T5 Nustep   Level  2    SPM  80    Minutes  15    METs  2      Home Exercise Plan   Plans to continue exercise at  [facility] (comment)    Frequency  Add 1 additional day to program exercise sessions.    Initial Home Exercises Provided  09/19/17  Functional Capacity: 6 Minute Walk    Row Name 08/09/17 1341 10/08/17 1737       6 Minute Walk   Phase  -  Discharge    Distance  1200 feet  1290 feet    Distance % Change  -  7.5 %    Distance Feet Change  -  90 ft    Walk Time  6 minutes  6 minutes    # of Rest Breaks  0  0    MPH  2.27  2.44    METS  3.18  3.75    RPE  13  13    Perceived Dyspnea   2  -    VO2 Peak  11.12  13.12    Symptoms  No  No    Resting HR  88 bpm  87 bpm    Resting BP  98/56  130/82    Exercise Oxygen Saturation  during 6 min walk  98 %  -    Max Ex. HR  120 bpm  118 bpm    Max Ex. BP  102/58  160/80    2 Minute Post BP  92/48  -       Quality of Life: Quality of Life - 08/09/17 1150      Quality of Life Scores   Health/Function Pre  24.8 %    Socioeconomic Pre  28.5 %    Psych/Spiritual Pre  30 %    Family Pre  27.6 %    GLOBAL Pre  27.09 %       Personal Goals: Goals established at orientation with interventions provided to work toward goal. Personal Goals and Risk Factors at Admission - 08/09/17 1233      Core Components/Risk Factors/Patient Goals on Admission    Weight Management  Yes;Weight Loss    Intervention  Weight Management: Develop a combined nutrition and exercise program designed to reach desired caloric intake,  while maintaining appropriate intake of nutrient and fiber, sodium and fats, and appropriate energy expenditure required for the weight goal.;Weight Management: Provide education and appropriate resources to help participant work on and attain dietary goals.    Admit Weight  202 lb 6.1 oz (91.8 kg)    Goal Weight: Short Term  197 lb (89.4 kg)    Goal Weight: Long Term  192 lb (87.1 kg)    Expected Outcomes  Short Term: Continue to assess and modify interventions until short term weight is achieved;Long Term: Adherence to nutrition and physical activity/exercise program aimed toward attainment of established weight goal;Weight Loss: Understanding of general recommendations for a balanced deficit meal plan, which promotes 1-2 lb weight loss per week and includes a negative energy balance of [phone] kcal/d;Understanding recommendations for meals to include 15-35% energy as protein, 25-35% energy from fat, 35-60% energy from carbohydrates, less than 289m of dietary cholesterol, 20-35 gm of total fiber daily;Understanding of distribution of calorie intake throughout the day with the consumption of 4-5 meals/snacks    Tobacco Cessation  Yes    Number of packs per day  0.25    Intervention  -- Kalub said he will quit the first of Jan     Expected Outcomes  Short Term: Will demonstrate readiness to quit, by selecting a quit date.;Long Term: Complete abstinence from all tobacco products for at least 12 months from quit date.;Short Term: Will quit all tobacco product use, adhering to prevention of relapse plan.    Diabetes  Yes  Intervention  Provide education about signs/symptoms and action to take for hypo/hyperglycemia.;Provide education about proper nutrition, including hydration, and aerobic/resistive exercise prescription along with prescribed medications to achieve blood glucose in normal ranges: Fasting glucose 65-99 mg/dL    Expected Outcomes  Short Term: Participant verbalizes understanding of the  signs/symptoms and immediate care of hyper/hypoglycemia, proper foot care and importance of medication, aerobic/resistive exercise and nutrition plan for blood glucose control.;Long Term: Attainment of HbA1C < 7%.    Hypertension  Yes    Intervention  Provide education on lifestyle modifcations including regular physical activity/exercise, weight management, moderate sodium restriction and increased consumption of fresh fruit, vegetables, and low fat dairy, alcohol moderation, and smoking cessation.;Monitor prescription use compliance.    Expected Outcomes  Short Term: Continued assessment and intervention until BP is < 140/71m HG in hypertensive participants. < 130/848mHG in hypertensive participants with diabetes, heart failure or chronic kidney disease.;Long Term: Maintenance of blood pressure at goal levels.    Stress  Yes    Intervention  Offer individual and/or small group education and counseling on adjustment to heart disease, stress management and health-related lifestyle change. Teach and support self-help strategies.;Refer participants experiencing significant psychosocial distress to appropriate mental health specialists for further evaluation and treatment. When possible, include family members and significant others in education/counseling sessions.    Expected Outcomes  Short Term: Participant demonstrates changes in health-related behavior, relaxation and other stress management skills, ability to obtain effective social support, and compliance with psychotropic medications if prescribed.;Long Term: Emotional wellbeing is indicated by absence of clinically significant psychosocial distress or social isolation.        Personal Goals Discharge: Goals and Risk Factor Review - 09/27/17 1609      Core Components/Risk Factors/Patient Goals Review   Personal Goals Review  Tobacco Cessation;Weight Management/Obesity;Lipids;Diabetes;Hypertension    Review  Moris reports he is still smoking - he  is taking 2 puffs and then throwing the cigarette out.  BG is still in the 90s fasting.  He feels "better than he has in 3 years."  He is eating more fruit and vegetables    Expected Outcomes  Short - He will go 24 hours without smoking Long - quit smoking altogether and continue healthy eating       Exercise Goals and Review: Exercise Goals    Row Name 08/09/17 1340             Exercise Goals   Increase Physical Activity  Yes       Intervention  Provide advice, education, support and counseling about physical activity/exercise needs.;Develop an individualized exercise prescription for aerobic and resistive training based on initial evaluation findings, risk stratification, comorbidities and participant's personal goals.       Expected Outcomes  Achievement of increased cardiorespiratory fitness and enhanced flexibility, muscular endurance and strength shown through measurements of functional capacity and personal statement of participant.       Increase Strength and Stamina  Yes       Intervention  Provide advice, education, support and counseling about physical activity/exercise needs.;Develop an individualized exercise prescription for aerobic and resistive training based on initial evaluation findings, risk stratification, comorbidities and participant's personal goals.       Expected Outcomes  Achievement of increased cardiorespiratory fitness and enhanced flexibility, muscular endurance and strength shown through measurements of functional capacity and personal statement of participant.       Able to understand and use rate of perceived exertion (RPE) scale  Yes  Intervention  Provide education and explanation on how to use RPE scale       Expected Outcomes  Short Term: Able to use RPE daily in rehab to express subjective intensity level;Long Term:  Able to use RPE to guide intensity level when exercising independently       Able to understand and use Dyspnea scale  Yes        Intervention  Provide education and explanation on how to use Dyspnea scale       Expected Outcomes  Short Term: Able to use Dyspnea scale daily in rehab to express subjective sense of shortness of breath during exertion;Long Term: Able to use Dyspnea scale to guide intensity level when exercising independently       Knowledge and understanding of Target Heart Rate Range (THRR)  Yes       Intervention  Provide education and explanation of THRR including how the numbers were predicted and where they are located for reference       Expected Outcomes  Short Term: Able to state/look up THRR;Long Term: Able to use THRR to govern intensity when exercising independently;Short Term: Able to use daily as guideline for intensity in rehab       Able to check pulse independently  Yes       Intervention  Provide education and demonstration on how to check pulse in carotid and radial arteries.;Review the importance of being able to check your own pulse for safety during independent exercise       Expected Outcomes  Short Term: Able to explain why pulse checking is important during independent exercise;Long Term: Able to check pulse independently and accurately       Understanding of Exercise Prescription  Yes       Intervention  Provide education, explanation, and written materials on patient's individual exercise prescription       Expected Outcomes  Short Term: Able to explain program exercise prescription;Long Term: Able to explain home exercise prescription to exercise independently          Nutrition & Weight - Outcomes: Pre Biometrics - 08/09/17 1340      Pre Biometrics   Height  6' (1.829 m)    Weight  202 lb 4.8 oz (91.8 kg)    Waist Circumference  40 inches    Hip Circumference  42 inches    Waist to Hip Ratio  0.95 %    BMI (Calculated)  27.43    Single Leg Stand  21.42 seconds        Nutrition: Nutrition Therapy & Goals - 09/27/17 1615      Nutrition Therapy   RD appointment deferred   Yes       Nutrition Discharge: Nutrition Assessments - 08/09/17 1415      MEDFICTS Scores   Pre Score  -- pt did not complete, sent home to bring back first day.        Education Questionnaire Score: Knowledge Questionnaire Score - 08/09/17 1152      Knowledge Questionnaire Score   Pre Score  13/28       Goals reviewed with patient; copy given to patient.

## 2017-10-17 ENCOUNTER — Encounter: Payer: Commercial Managed Care - HMO | Admitting: *Deleted

## 2017-10-17 ENCOUNTER — Encounter: Payer: Self-pay | Admitting: *Deleted

## 2017-10-17 DIAGNOSIS — I213 ST elevation (STEMI) myocardial infarction of unspecified site: Secondary | ICD-10-CM | POA: Diagnosis not present

## 2017-10-17 NOTE — Progress Notes (Signed)
Cardiac Individual Treatment Plan  Patient Details  Name: Lawrence Holmes MRN: [id number] Date of Birth: July 12, 1956 Referring Provider:     Cardiac Rehab from 08/09/2017 in [facility] Cardiac and Pulmonary Rehab  Referring Provider  Arida      Initial Encounter Date:    Cardiac Rehab from 08/09/2017 in [facility] Cardiac and Pulmonary Rehab  Date  08/09/17  Referring Provider  Fletcher Anon      Visit Diagnosis: ST elevation myocardial infarction (STEMI), unspecified artery ([place])  Patient's Home Medications on Admission:  Current Outpatient Medications:  .  aspirin 81 MG chewable tablet, Chew 1 tablet (81 mg total) by mouth daily., Disp: 30 tablet, Rfl: 2 .  atorvastatin (LIPITOR) 80 MG tablet, Take 1 tablet (80 mg total) by mouth daily at 6 PM., Disp: 30 tablet, Rfl: 2 .  carvedilol (COREG) 3.125 MG tablet, Take 1 tablet (3.125 mg total) by mouth 2 (two) times daily with a meal., Disp: 60 tablet, Rfl: 1 .  clopidogrel (PLAVIX) 75 MG tablet, Take 1 tablet (75 mg total) by mouth daily with breakfast., Disp: 30 tablet, Rfl: 2 .  eplerenone (INSPRA) 25 MG tablet, Take 1 tablet (25 mg total) by mouth daily., Disp: 30 tablet, Rfl: 6 .  metFORMIN (GLUCOPHAGE) 1000 MG tablet, Take 1 tablet (1,000 mg total) by mouth 2 (two) times daily with a meal., Disp: 60 tablet, Rfl: 1 .  nitroGLYCERIN (NITROSTAT) 0.4 MG SL tablet, Place 1 tablet (0.4 mg total) under the tongue every 5 (five) minutes as needed for chest pain., Disp: 30 tablet, Rfl: 2 .  sacubitril-valsartan (ENTRESTO) 24-26 MG, Take 1 tablet by mouth 2 (two) times daily., Disp: 180 tablet, Rfl: 3  Past Medical History: Past Medical History:  Diagnosis Date  . Chronic systolic heart failure ([place]) 07/2017   a. 07/2017 Echo: EF 30-35%, Gr1 DD, inflat, and inf AK, mild to mod MR, mildly dil LA/RA.  [name] Coronary artery disease 07/2017   a. 07/2017 Late presenting inferior STEMI/Cath: LM nl, LAD min irregs, LCX 100d (L->L collats), RCA 100p (L->R collats), EF  25-35%-->Med Rx.  . Diabetes mellitus without complication ([place])   . Hyperlipidemia   . Ischemic cardiomyopathy    a. 07/2017 Echo: EF 30-35%.  . Tobacco use     Tobacco Use: Social History   Tobacco Use  Smoking Status Current Every Day Smoker  . Packs/day: 0.50  . Types: Cigarettes  Smokeless Tobacco Never Used  Tobacco Comment   09/06/17 Tobacco cessation reviewed. Currently smoking about 10 cigs a day.    Labs: Recent Review Flowsheet Data    Labs for ITP Cardiac and Pulmonary Rehab Latest Ref Rng & Units 07/21/2017 09/12/2017   Cholestrol 0 - 200 mg/dL - 88   LDLCALC 0 - 99 mg/dL - 51   HDL >40 mg/dL - 24(L)   Trlycerides <150 mg/dL - 63   Hemoglobin A1c 4.8 - 5.6 % 11.4(H) -       Exercise Target Goals:    Exercise Program Goal: Individual exercise prescription set using results from initial 6 min walk test and THRR while considering  patient's activity barriers and safety.   Exercise Prescription Goal: Initial exercise prescription builds to 30-45 minutes a day of aerobic activity, 2-3 days per week.  Home exercise guidelines will be given to patient during program as part of exercise prescription that the participant will acknowledge.  Activity Barriers & Risk Stratification: Activity Barriers & Cardiac Risk Stratification - 08/09/17 1232  Activity Barriers & Cardiac Risk Stratification   Activity Barriers  None    Cardiac Risk Stratification  High       6 Minute Walk: 6 Minute Walk    Row Name 08/09/17 1341 10/08/17 1737       6 Minute Walk   Phase  -  Discharge    Distance  1200 feet  1290 feet    Distance % Change  -  7.5 %    Distance Feet Change  -  90 ft    Walk Time  6 minutes  6 minutes    # of Rest Breaks  0  0    MPH  2.27  2.44    METS  3.18  3.75    RPE  13  13    Perceived Dyspnea   2  -    VO2 Peak  11.12  13.12    Symptoms  No  No    Resting HR  88 bpm  87 bpm    Resting BP  98/56  130/82    Exercise Oxygen Saturation   during 6 min walk  98 %  -    Max Ex. HR  120 bpm  118 bpm    Max Ex. BP  102/58  160/80    2 Minute Post BP  92/48  -       Oxygen Initial Assessment:   Oxygen Re-Evaluation:   Oxygen Discharge (Final Oxygen Re-Evaluation):   Initial Exercise Prescription: Initial Exercise Prescription - 08/09/17 1300      Date of Initial Exercise RX and Referring Provider   Date  08/09/17    Referring Provider  Arida      Treadmill   MPH  2.2    Grade  1    Minutes  15    METs  3      Recumbant Bike   Level  3    RPM  60    Watts  34    Minutes  15    METs  3      REL-XR   Level  3    Speed  50    Minutes  15      T5 Nustep   Level  2    SPM  80    Minutes  15    METs  3      Prescription Details   Frequency (times per week)  3    Duration  Progress to 45 minutes of aerobic exercise without signs/symptoms of physical distress      Intensity   THRR 40-80% of Max Heartrate  116-144    Ratings of Perceived Exertion  11-13    Perceived Dyspnea  0-4      Resistance Training   Training Prescription  Yes    Weight  4lb    Reps  10-15       Perform Capillary Blood Glucose checks as needed.  Exercise Prescription Changes: Exercise Prescription Changes    Row Name 08/29/17 1200 09/11/17 1500 09/19/17 1700 09/26/17 1200 10/09/17 1100     Response to Exercise   Blood Pressure (Admit)  130/58  122/68  122/68  132/64  130/82   Blood Pressure (Exercise)  158/80  142/68  142/68  140/80  160/80   Blood Pressure (Exit)  138/62  114/66  114/66  102/60  120/68   Heart Rate (Admit)  88 bpm  98 bpm  98 bpm  80 bpm  80  bpm   Heart Rate (Exercise)  127 bpm  120 bpm  120 bpm  125 bpm  128 bpm   Heart Rate (Exit)  90 bpm  87 bpm  87 bpm  891 bpm  87 bpm   Rating of Perceived Exertion (Exercise)  _0 Symptoms  none  none  none  none  none   Duration  Progress to 45 minutes of aerobic exercise without signs/symptoms of physical distress  Continue with 45 min of  aerobic exercise without signs/symptoms of physical distress.  Continue with 45 min of aerobic exercise without signs/symptoms of physical distress.  Continue with 45 min of aerobic exercise without signs/symptoms of physical distress.  Continue with 45 min of aerobic exercise without signs/symptoms of physical distress.   Intensity  THRR unchanged  THRR unchanged  THRR unchanged  THRR unchanged  THRR unchanged     Progression   Progression  Continue to progress workloads to maintain intensity without signs/symptoms of physical distress.  Continue to progress workloads to maintain intensity without signs/symptoms of physical distress.  Continue to progress workloads to maintain intensity without signs/symptoms of physical distress.  Continue to progress workloads to maintain intensity without signs/symptoms of physical distress.  Continue to progress workloads to maintain intensity without signs/symptoms of physical distress.   Average METs  -  -  -  3  3.75 post walk     Resistance Training   Training Prescription  Yes  Yes  Yes  Yes  No   Weight  4 lb  4 lb  4 lb  4 lb  4 lb   Reps  10-15  10-15  10-15  10-15  10-15     Interval Training   Interval Training  -  -  -  No  -     Treadmill   MPH  -  2.2  2.2  2.2  -   Grade  -  1  1  1.5  -   Minutes  -  _1 -   METs  -  _2 -     Recumbant Bike   Level  3  -  -  -  -   RPM  60  -  -  -  -   Minutes  15  -  -  -  -     REL-XR   Level  -  -  -  3  -   Speed  -  -  -  50  -   Minutes  -  -  -  2.9  -     T5 Nustep   Level  _3 -  2   SPM  80  80  80  -  80   Minutes  _4 -  15   METs  -  2.1  2.1  -  2     Home Exercise Plan   Plans to continue exercise at  -  -  [employer] (comment)  [employer] (comment)  [employer] (comment)   Frequency  -  -  Add 1 additional day to program exercise sessions.  Add 1 additional day to program exercise sessions.  Add 1 additional day to program  exercise sessions.   Initial Home Exercises Provided  -  -  09/19/17  09/19/17  09/19/17      Exercise Comments: Exercise Comments    Row Name 08/27/17 1741 09/19/17 1740 10/17/17 1712       Exercise Comments  First full day of exercise!  Patient was oriented to gym and equipment including functions, settings, policies, and procedures.  Patient's individual exercise prescription and treatment plan were reviewed.  All starting workloads were established based on the results of the 6 minute walk test done at initial orientation visit.  The plan for exercise progression was also introduced and progression will be customized based on patient's performance and goals   Reviewed home exercise with pt today.  Pt plans to consider joining a gym for exercise.  Reviewed THR, pulse, RPE, sign and symptoms, NTG use, and when to call 911 or MD.  Also discussed weather considerations and indoor options.  Pt voiced understandi  Lawrence Holmes graduated today from  rehab with 36 sessions completed.  Details of the patient's exercise prescription and what He needs to do in order to continue the prescription and progress were discussed with patient.  Patient was given a copy of prescription and goals.  Patient verbalized understanding.  Lawrence Holmes plans to continue to exercise by attending a local gym .        Exercise Goals and Review: Exercise Goals    Row Name 08/09/17 1340             Exercise Goals   Increase Physical Activity  Yes       Intervention  Provide advice, education, support and counseling about physical activity/exercise needs.;Develop an individualized exercise prescription for aerobic and resistive training based on initial evaluation findings, risk stratification, comorbidities and participant's personal goals.       Expected Outcomes  Achievement of increased cardiorespiratory fitness and enhanced flexibility, muscular endurance and strength shown through measurements of functional capacity and personal  statement of participant.       Increase Strength and Stamina  Yes       Intervention  Provide advice, education, support and counseling about physical activity/exercise needs.;Develop an individualized exercise prescription for aerobic and resistive training based on initial evaluation findings, risk stratification, comorbidities and participant's personal goals.       Expected Outcomes  Achievement of increased cardiorespiratory fitness and enhanced flexibility, muscular endurance and strength shown through measurements of functional capacity and personal statement of participant.       Able to understand and use rate of perceived exertion (RPE) scale  Yes       Intervention  Provide education and explanation on how to use RPE scale       Expected Outcomes  Short Term: Able to use RPE daily in rehab to express subjective intensity level;Long Term:  Able to use RPE to guide intensity level when exercising independently       Able to understand and use Dyspnea scale  Yes       Intervention  Provide education and explanation on how to use Dyspnea scale       Expected Outcomes  Short Term: Able to use Dyspnea scale daily in rehab to express subjective sense of shortness of breath during exertion;Long Term: Able to use Dyspnea scale to guide intensity level when exercising independently       Knowledge and understanding of Target Heart Rate Range (THRR)  Yes       Intervention  Provide education and explanation of THRR including how the numbers were predicted and where they are located for reference  Expected Outcomes  Short Term: Able to state/look up THRR;Long Term: Able to use THRR to govern intensity when exercising independently;Short Term: Able to use daily as guideline for intensity in rehab       Able to check pulse independently  Yes       Intervention  Provide education and demonstration on how to check pulse in carotid and radial arteries.;Review the importance of being able to check your  own pulse for safety during independent exercise       Expected Outcomes  Short Term: Able to explain why pulse checking is important during independent exercise;Long Term: Able to check pulse independently and accurately       Understanding of Exercise Prescription  Yes       Intervention  Provide education, explanation, and written materials on patient's individual exercise prescription       Expected Outcomes  Short Term: Able to explain program exercise prescription;Long Term: Able to explain home exercise prescription to exercise independently          Exercise Goals Re-Evaluation : Exercise Goals Re-Evaluation    Row Name 09/06/17 1635 09/11/17 1526 09/19/17 1740 09/26/17 1216 10/08/17 1739     Exercise Goal Re-Evaluation   Exercise Goals Review  Increase Physical Activity;Increase Strength and Stamina;Knowledge and understanding of Target Heart Rate Range (THRR);Understanding of Exercise Prescription;Able to check pulse independently  Increase Physical Activity;Able to understand and use rate of perceived exertion (RPE) scale;Increase Strength and Stamina;Knowledge and understanding of Target Heart Rate Range (THRR)  Increase Physical Activity;Increase Strength and Stamina;Able to understand and use rate of perceived exertion (RPE) scale;Knowledge and understanding of Target Heart Rate Range (THRR);Able to check pulse independently;Understanding of Exercise Prescription  Increase Physical Activity;Increase Strength and Stamina;Able to understand and use Dyspnea scale;Able to understand and use rate of perceived exertion (RPE) scale  Increase Physical Activity;Increase Strength and Stamina   Comments  Lawrence Holmes stated that he felt he was getting stronger and making progress with his exercise routine.   Lawrence Holmes is progressing well with exercise.  He is still working on readiness to quit smoking.   Reviewed home exercise with pt today.  Pt plans to consider joining a gym for exercise.  Reviewed THR, pulse,  RPE, sign and symptoms, NTG use, and when to call 911 or MD.  Also discussed weather considerations and indoor options.  Pt voiced understandi  Lawrence Holmes is progressing well with exercise.  Staff will introduce interval training.  6 min walk test done today. Results reviewed with patinet. See detailed report on 6 min walk data sheet. Plans to join [employer] upon graduation.    Expected Outcomes  Short: Attend [facility] regularly Long: Adopt a healthy exercise and physical activity routine to do indpendently.   Short - Dieter will continue to exercise at [facility] Long - Rayan will exercise independently  Short - Lawrence Holmes will ad one day of exercise in addition to program sessions  Long - Lawrence Holmes will continue exercise on his own  Short - Lawrence Holmes wil add intervals to his program.  Long - Lawrence Holmes will continue to improve MET levels  -   Row Name 10/09/17 1134             Exercise Goal Re-Evaluation   Exercise Goals Review  Increase Physical Activity;Increase Strength and Stamina;Able to understand and use rate of perceived exertion (RPE) scale       Comments  Lawrence Holmes increased MET level from 3.18 on pre walk to 3.75 on post walk  test.       Expected Outcomes  Short - Syncere will complete HT program Long - Lawrence Holmes will maintain fitness on his own           Discharge Exercise Prescription (Final Exercise Prescription Changes): Exercise Prescription Changes - 10/09/17 1100      Response to Exercise   Blood Pressure (Admit)  130/82    Blood Pressure (Exercise)  160/80    Blood Pressure (Exit)  120/68    Heart Rate (Admit)  80 bpm    Heart Rate (Exercise)  128 bpm    Heart Rate (Exit)  87 bpm    Rating of Perceived Exertion (Exercise)  13    Symptoms  none    Duration  Continue with 45 min of aerobic exercise without signs/symptoms of physical distress.    Intensity  THRR unchanged      Progression   Progression  Continue to progress workloads to maintain intensity without signs/symptoms of physical distress.    Average METs  3.75  post walk      Resistance Training   Training Prescription  No    Weight  4 lb    Reps  10-15      T5 Nustep   Level  2    SPM  80    Minutes  15    METs  2      Home Exercise Plan   Plans to continue exercise at  [facility] (comment)    Frequency  Add 1 additional day to program exercise sessions.    Initial Home Exercises Provided  09/19/17       Nutrition:  Target Goals: Understanding of nutrition guidelines, daily intake of sodium <1516m, cholesterol <2037m calories 30% from fat and 7% or less from saturated fats, daily to have 5 or more servings of fruits and vegetables.  Biometrics: Pre Biometrics - 08/09/17 1340      Pre Biometrics   Height  6' (1.829 m)    Weight  202 lb 4.8 oz (91.8 kg)    Waist Circumference  40 inches    Hip Circumference  42 inches    Waist to Hip Ratio  0.95 %    BMI (Calculated)  27.43    Single Leg Stand  21.42 seconds        Nutrition Therapy Plan and Nutrition Goals: Nutrition Therapy & Goals - 09/27/17 1615      Nutrition Therapy   RD appointment deferred  Yes       Nutrition Assessments: Nutrition Assessments - 10/17/17 1806      MEDFICTS Scores   Post Score  37       Nutrition Goals Re-Evaluation: Nutrition Goals Re-Evaluation    Row Name 09/06/17 1632             Goals   Comment  Kaeleb is currently not interested in meeting with the dietician idividulally, but does want to listen to the group lectures on nutrician when they rotate though the group education cycle.        Expected Outcome  Short: Login will be present at group nutician lecture. Long: Bradden will use knowledge gained to adopt a heart healthy eating plan.           Nutrition Goals Discharge (Final Nutrition Goals Re-Evaluation): Nutrition Goals Re-Evaluation - 09/06/17 1632      Goals   Comment  Emran is currently not interested in meeting with the dietician idividulally, but does want to listen  to the group lectures on nutrician when they  rotate though the group education cycle.     Expected Outcome  Short: Rieley will be present at group nutician lecture. Long: Usman will use knowledge gained to adopt a heart healthy eating plan.        Psychosocial: Target Goals: Acknowledge presence or absence of significant depression and/or stress, maximize coping skills, provide positive support system. Participant is able to verbalize types and ability to use techniques and skills needed for reducing stress and depression.   Initial Review & Psychosocial Screening: Initial Psych Review & Screening - 08/09/17 1235      Initial Review   Current issues with  Current Stress Concerns    Source of Stress Concerns  Financial      Family Dynamics   Good Support System?  Yes      Barriers   Psychosocial barriers to participate in program  The patient should benefit from training in stress management and relaxation.;Psychosocial barriers identified (see note)      Screening Interventions   Interventions  Encouraged to exercise;Yes;Program counselor consult    Expected Outcomes  Short Term goal: Utilizing psychosocial counselor, staff and physician to assist with identification of specific Stressors or current issues interfering with healing process. Setting desired goal for each stressor or current issue identified.;Long Term Goal: Stressors or current issues are controlled or eliminated.       Quality of Life Scores:  Quality of Life - 10/17/17 1805      Quality of Life Scores   Health/Function Post  22.63 %    Socioeconomic Post  23.07 %    Psych/Spiritual Post  28.93 %    Family Post  24 %    GLOBAL Post  24.22 %      Scores of 19 and below usually indicate a poorer quality of life in these areas.  A difference of  2-3 points is a clinically meaningful difference.  A difference of 2-3 points in the total score of the Quality of Life Index has been associated with significant improvement in overall quality of life, self-image, physical  symptoms, and general health in studies assessing change in quality of life.  PHQ-9: Recent Review Flowsheet Data    Depression screen [facility] 2/9 10/17/2017 08/09/2017   Decreased Interest 0 0   Down, Depressed, Hopeless 0 0   PHQ - 2 Score 0 0   Altered sleeping 0 0   Tired, decreased energy 0 0   Change in appetite 0 0   Feeling bad or failure about yourself  0 0   Trouble concentrating 0 0   Moving slowly or fidgety/restless 0 0   Suicidal thoughts 0 0   PHQ-9 Score 0 0     Interpretation of Total Score  Total Score Depression Severity:  1-4 = Minimal depression, 5-9 = Mild depression, 10-14 = Moderate depression, 15-19 = Moderately severe depression, 20-27 = Severe depression   Psychosocial Evaluation and Intervention: Psychosocial Evaluation - 08/27/17 1732      Psychosocial Evaluation & Interventions   Interventions  Encouraged to exercise with the program and follow exercise prescription;Stress management education    Comments  Counselor met with [name]  [facility]) today for initial psychosocial evaluation.  He is a 62 year old who had a heart attack on 12/1.  He lives alone but has a strong wupport system with a sister and brother who live locally and Seiya is actively involved in his local church.  Lawrence Holmes considers himself pretty healthy except for this cardiac incident and he has diabetes.  He sleeps well and has a good appetite.  He denies a history of depression or anxiety and reports minimal stress in his life other than not being able to return to work yet. His goals are to increase his stamina and strength while in this program.  Zakarie is a positive person and appears to be very well-adjusted.  Staff will follow with him throughout the course of this program.    Expected Outcomes  Lawrence Holmes will benefit from consistent exercise to achieve his stated goals.  The educational and psychoeducational components of this program will help him learn more about his condition and positive ways to cope  with it.      Continue Psychosocial Services   Follow up required by staff       Psychosocial Re-Evaluation: Psychosocial Re-Evaluation    [place] Name 09/10/17 1726 09/27/17 1614           Psychosocial Re-Evaluation   Current issues with  Current Stress Concerns  None Identified      Comments  Pt. has returned to work but doesn't feel like it has added stress.   Lawrence Holmes reports no stressors at this time      Expected Outcomes  -  Lawrence Holmes will maintain exercise to help reduce stress.      Interventions  -  Encouraged to attend Cardiac Rehabilitation for the exercise      Continue Psychosocial Services   -  Follow up required by staff         Psychosocial Discharge (Final Psychosocial Re-Evaluation): Psychosocial Re-Evaluation - 09/27/17 1614      Psychosocial Re-Evaluation   Current issues with  None Identified    Comments  Lawrence Holmes reports no stressors at this time    Expected Outcomes  Lawrence Holmes will maintain exercise to help reduce stress.    Interventions  Encouraged to attend Cardiac Rehabilitation for the exercise    Continue Psychosocial Services   Follow up required by staff       Vocational Rehabilitation: Provide vocational rehab assistance to qualifying candidates.   Vocational Rehab Evaluation & Intervention: Vocational Rehab - 08/09/17 1150      Initial Vocational Rehab Evaluation & Intervention   Assessment shows need for Vocational Rehabilitation  No       Education: Education Goals: Education classes will be provided on a variety of topics geared toward better understanding of heart health and risk factor modification. Participant will state understanding/return demonstration of topics presented as noted by education test scores.  Learning Barriers/Preferences: Learning Barriers/Preferences - 08/09/17 1231      Learning Barriers/Preferences   Learning Barriers  None    Learning Preferences  Individual Instruction       Education Topics:  AED/CPR: - Group verbal and  written instruction with the use of models to demonstrate the basic use of the AED with the basic ABC's of resuscitation.   Cardiac Rehab from 10/17/2017 in [facility] Cardiac and Pulmonary Rehab  Date  10/01/17  Educator  MA  Instruction Review Code  1- Verbalizes Understanding      General Nutrition Guidelines/Fats and Fiber: -Group instruction provided by verbal, written material, models and posters to present the general guidelines for heart healthy nutrition. Gives an explanation and review of dietary fats and fiber.   Cardiac Rehab from 10/17/2017 in [facility] Cardiac and Pulmonary Rehab  Date  09/17/17  Educator  PI  Instruction Review Code  1- Verbalizes Understanding      Controlling Sodium/Reading Food Labels: -Group verbal and written material supporting the discussion of sodium use in heart healthy nutrition. Review and explanation with models, verbal and written materials for utilization of the food label.   Cardiac Rehab from 10/17/2017 in [facility] Cardiac and Pulmonary Rehab  Date  09/24/17  Educator  PI  Instruction Review Code  1- Verbalizes Understanding      Exercise Physiology & General Exercise Guidelines: - Group verbal and written instruction with models to review the exercise physiology of the cardiovascular system and associated critical values. Provides general exercise guidelines with specific guidelines to those with heart or lung disease.    Cardiac Rehab from 10/17/2017 in [facility] Cardiac and Pulmonary Rehab  Date  10/08/17  Educator  [facility]  Instruction Review Code  1- Verbalizes Understanding      Aerobic Exercise & Resistance Training: - Gives group verbal and written instruction on the various components of exercise. Focuses on aerobic and resistive training programs and the benefits of this training and how to safely progress through these programs..   Cardiac Rehab from 10/17/2017 in [facility] Cardiac and Pulmonary Rehab  Date  10/15/17  Educator  AS  Instruction Review  Code  1- Verbalizes Understanding      Flexibility, Balance, Mind/Body Relaxation: Provides group verbal/written instruction on the benefits of flexibility and balance training, including mind/body exercise modes such as yoga, pilates and tai chi.  Demonstration and skill practice provided.   Cardiac Rehab from 10/17/2017 in [facility] Cardiac and Pulmonary Rehab  Date  10/17/17  Educator  AS  Instruction Review Code  1- Verbalizes Understanding      Stress and Anxiety: - Provides group verbal and written instruction about the health risks of elevated stress and causes of high stress.  Discuss the correlation between heart/lung disease and anxiety and treatment options. Review healthy ways to manage with stress and anxiety.   Cardiac Rehab from 10/17/2017 in [facility] Cardiac and Pulmonary Rehab  Date  09/12/17  Educator  [facility]  Instruction Review Code  1- Verbalizes Understanding      Depression: - Provides group verbal and written instruction on the correlation between heart/lung disease and depressed mood, treatment options, and the stigmas associated with seeking treatment.   Cardiac Rehab from 10/17/2017 in [facility] Cardiac and Pulmonary Rehab  Date  10/10/17  Educator  [facility]  Instruction Review Code  1- Verbalizes Understanding      Anatomy & Physiology of the Heart: - Group verbal and written instruction and models provide basic cardiac anatomy and physiology, with the coronary electrical and arterial systems. Review of Valvular disease and Heart Failure   Cardiac Rehab from 10/17/2017 in [facility] Cardiac and Pulmonary Rehab  Date  09/10/17  Educator  CE  Instruction Review Code  1- Verbalizes Understanding      Cardiac Procedures: - Group verbal and written instruction to review commonly prescribed medications for heart disease. Reviews the medication, class of the drug, and side effects. Includes the steps to properly store meds and maintain the prescription regimen. (beta blockers and  nitrates)   Cardiac Rehab from 10/17/2017 in [facility] Cardiac and Pulmonary Rehab  Date  09/19/17  Educator  [facility]  Instruction Review Code  1- Verbalizes Understanding      Cardiac Medications I: - Group verbal and written instruction to review commonly prescribed medications for heart disease. Reviews the medication, class of the drug, and side effects. Includes the steps to properly store  meds and maintain the prescription regimen.   Cardiac Rehab from 10/17/2017 in [facility] Cardiac and Pulmonary Rehab  Date  09/03/17  Educator  [facility]  Instruction Review Code  1- Verbalizes Understanding      Cardiac Medications II: -Group verbal and written instruction to review commonly prescribed medications for heart disease. Reviews the medication, class of the drug, and side effects. (all other drug classes)   Cardiac Rehab from 10/17/2017 in [facility] Cardiac and Pulmonary Rehab  Date  08/29/17  Educator  [facility]  Instruction Review Code  1- Verbalizes Understanding       Go Sex-Intimacy & Heart Disease, Get SMART - Goal Setting: - Group verbal and written instruction through game format to discuss heart disease and the return to sexual intimacy. Provides group verbal and written material to discuss and apply goal setting through the application of the S.M.A.R.T. Method.   Cardiac Rehab from 10/17/2017 in [facility] Cardiac and Pulmonary Rehab  Date  09/19/17  Educator  [facility] [facility]  Instruction Review Code  1- Verbalizes Understanding      Other Matters of the Heart: - Provides group verbal, written materials and models to describe Stable Angina and Peripheral Artery. Includes description of the disease process and treatment options available to the cardiac patient.   Exercise & Equipment Safety: - Individual verbal instruction and demonstration of equipment use and safety with use of the equipment.   Cardiac Rehab from 10/17/2017 in [facility] Cardiac and Pulmonary Rehab  Date  08/09/17  Educator  C. Enterkin, RN  Instruction  Review Code  3- Needs Reinforcement      Infection Prevention: - Provides verbal and written material to individual with discussion of infection control including proper hand washing and proper equipment cleaning during exercise session.   Cardiac Rehab from 10/17/2017 in [facility] Cardiac and Pulmonary Rehab  Date  08/09/17  Educator  C. EnterkinRN  Instruction Review Code  1- Verbalizes Understanding      Falls Prevention: - Provides verbal and written material to individual with discussion of falls prevention and safety.   Cardiac Rehab from 10/17/2017 in [facility] Cardiac and Pulmonary Rehab  Date  08/09/17  Educator  [place]. [place]  Instruction Review Code  2- Demonstrated Understanding      Diabetes: - Individual verbal and written instruction to review signs/symptoms of diabetes, desired ranges of glucose level fasting, after meals and with exercise. Acknowledge that pre and post exercise glucose checks will be done for 3 sessions at entry of program.   Cardiac Rehab from 10/17/2017 in [facility] Cardiac and Pulmonary Rehab  Date  08/09/17  Educator  C. EnterkinRN  Instruction Review Code  1- Verbalizes Understanding      Know Your Numbers and Risk Factors: -Group verbal and written instruction about important numbers in your health.  Discussion of what are risk factors and how they play a role in the disease process.  Review of Cholesterol, Blood Pressure, Diabetes, and BMI and the role they play in your overall health.   Cardiac Rehab from 10/17/2017 in [facility] Cardiac and Pulmonary Rehab  Date  08/29/17  Educator  [facility]  Instruction Review Code  1- Verbalizes Understanding      Sleep Hygiene: -Provides group verbal and written instruction about how sleep can affect your health.  Define sleep hygiene, discuss sleep cycles and impact of sleep habits. Review good sleep hygiene tips.    Cardiac Rehab from 10/17/2017 in [facility] Cardiac and Pulmonary Rehab  Date  09/26/17  Educator  [facility]  Instruction  Review Code  1- Verbalizes Understanding      Other: -Provides group and verbal instruction on various topics (see comments)   Knowledge Questionnaire Score: Knowledge Questionnaire Score - 10/17/17 1805      Knowledge Questionnaire Score   Post Score  27/28       Core Components/Risk Factors/Patient Goals at Admission: Personal Goals and Risk Factors at Admission - 08/09/17 1233      Core Components/Risk Factors/Patient Goals on Admission    Weight Management  Yes;Weight Loss    Intervention  Weight Management: Develop a combined nutrition and exercise program designed to reach desired caloric intake, while maintaining appropriate intake of nutrient and fiber, sodium and fats, and appropriate energy expenditure required for the weight goal.;Weight Management: Provide education and appropriate resources to help participant work on and attain dietary goals.    Admit Weight  202 lb 6.1 oz (91.8 kg)    Goal Weight: Short Term  197 lb (89.4 kg)    Goal Weight: Long Term  192 lb (87.1 kg)    Expected Outcomes  Short Term: Continue to assess and modify interventions until short term weight is achieved;Long Term: Adherence to nutrition and physical activity/exercise program aimed toward attainment of established weight goal;Weight Loss: Understanding of general recommendations for a balanced deficit meal plan, which promotes 1-2 lb weight loss per week and includes a negative energy balance of [phone] kcal/d;Understanding recommendations for meals to include 15-35% energy as protein, 25-35% energy from fat, 35-60% energy from carbohydrates, less than '200mg'$  of dietary cholesterol, 20-35 gm of total fiber daily;Understanding of distribution of calorie intake throughout the day with the consumption of 4-5 meals/snacks    Tobacco Cessation  Yes    Number of packs per day  0.25    Intervention  -- Kesley said he will quit the first of Jan     Expected Outcomes  Short Term: Will demonstrate readiness  to quit, by selecting a quit date.;Long Term: Complete abstinence from all tobacco products for at least 12 months from quit date.;Short Term: Will quit all tobacco product use, adhering to prevention of relapse plan.    Diabetes  Yes    Intervention  Provide education about signs/symptoms and action to take for hypo/hyperglycemia.;Provide education about proper nutrition, including hydration, and aerobic/resistive exercise prescription along with prescribed medications to achieve blood glucose in normal ranges: Fasting glucose 65-99 mg/dL    Expected Outcomes  Short Term: Participant verbalizes understanding of the signs/symptoms and immediate care of hyper/hypoglycemia, proper foot care and importance of medication, aerobic/resistive exercise and nutrition plan for blood glucose control.;Long Term: Attainment of HbA1C < 7%.    Hypertension  Yes    Intervention  Provide education on lifestyle modifcations including regular physical activity/exercise, weight management, moderate sodium restriction and increased consumption of fresh fruit, vegetables, and low fat dairy, alcohol moderation, and smoking cessation.;Monitor prescription use compliance.    Expected Outcomes  Short Term: Continued assessment and intervention until BP is < 140/21m HG in hypertensive participants. < 130/831mHG in hypertensive participants with diabetes, heart failure or chronic kidney disease.;Long Term: Maintenance of blood pressure at goal levels.    Stress  Yes    Intervention  Offer individual and/or small group education and counseling on adjustment to heart disease, stress management and health-related lifestyle change. Teach and support self-help strategies.;Refer participants experiencing significant psychosocial distress to appropriate mental health specialists for further evaluation and treatment. When possible, include family members and significant others in  education/counseling sessions.    Expected Outcomes  Short  Term: Participant demonstrates changes in health-related behavior, relaxation and other stress management skills, ability to obtain effective social support, and compliance with psychotropic medications if prescribed.;Long Term: Emotional wellbeing is indicated by absence of clinically significant psychosocial distress or social isolation.       Core Components/Risk Factors/Patient Goals Review:  Goals and Risk Factor Review    Row Name 09/06/17 1628 09/10/17 1653 09/27/17 1609         Core Components/Risk Factors/Patient Goals Review   Personal Goals Review  Weight Management/Obesity;Tobacco Cessation;Diabetes;Hypertension;Lipids  Tobacco Cessation;Hypertension;Lipids;Diabetes  Tobacco Cessation;Weight Management/Obesity;Lipids;Diabetes;Hypertension     Review  Jaze stated that he is trying to maintain weight and gain strength after losing 10 lbs when sick. He reports blood sugar, lipids, and blood pressure have all been managed and he is taking all meds. He is still smoking about 10 cigarettes a day, which is about a pack less then he used to smoke.   Tobacco use is about the same.  He has info about the [employer].  His Dr appt this week is Wed.  Fasting BG has been 98.  He is taking meds as prescribed for BP and lipids  He would like to get back to 215 lb .He feels he is getting stronger "one lb at a time"  Orie reports he is still smoking - he is taking 2 puffs and then throwing the cigarette out.  BG is still in the 90s fasting.  He feels "better than he has in 3 years."  He is eating more fruit and vegetables     Expected Outcomes  Short: Lendell will have updated lab work next week to evaluate where his numbers currently stand. Long: Gain strength and maintain weight through exercise program and decrease number of cigarettes smoked per day.   Short - Lawrence Holmes will get results of current bloodwork and follow Dr instructions.  He is not ready to quit smoking at this time.  Long - Lawrence Holmes will reach  weight and strength/stamina goals.  Short - He will go 24 hours without smoking Long - quit smoking altogether and continue healthy eating        Core Components/Risk Factors/Patient Goals at Discharge (Final Review):  Goals and Risk Factor Review - 09/27/17 1609      Core Components/Risk Factors/Patient Goals Review   Personal Goals Review  Tobacco Cessation;Weight Management/Obesity;Lipids;Diabetes;Hypertension    Review  Lawrence Holmes reports he is still smoking - he is taking 2 puffs and then throwing the cigarette out.  BG is still in the 90s fasting.  He feels "better than he has in 3 years."  He is eating more fruit and vegetables    Expected Outcomes  Short - He will go 24 hours without smoking Long - quit smoking altogether and continue healthy eating       ITP Comments: ITP Comments    Row Name 08/09/17 1154 08/09/17 1233 08/22/17 1027 09/19/17 0636 10/17/17 0600   ITP Comments  ITP created during Medical Review after the Cardiac Rehab Informed consent was signed by Ameya [name]. Diagnosis documented hospital discharge note 07/23/2017.  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 Lochlann reports that normally his HBA1c is 11 "since I have been eating bad lately but normally it is in the 7 range".   30 day review. Continue with ITP unless directed changes per Medical Director review.   HAs not attended since 08/09/2017  30 Day review. Continue with ITP unless directed changes per Medical Director review.   30 day review. Continue with ITP unless directed changes per Medical Director review.      Comments: Discharge ITP

## 2017-10-17 NOTE — Progress Notes (Signed)
Cardiac Individual Treatment Plan  Patient Details  Name: Lawrence Holmes MRN: [id number] Date of Birth: July 12, 1956 Referring Provider:     Cardiac Rehab from 08/09/2017 in [facility] Cardiac and Pulmonary Rehab  Referring Provider  Arida      Initial Encounter Date:    Cardiac Rehab from 08/09/2017 in [facility] Cardiac and Pulmonary Rehab  Date  08/09/17  Referring Provider  Fletcher Anon      Visit Diagnosis: ST elevation myocardial infarction (STEMI), unspecified artery ([place])  Patient's Home Medications on Admission:  Current Outpatient Medications:  .  aspirin 81 MG chewable tablet, Chew 1 tablet (81 mg total) by mouth daily., Disp: 30 tablet, Rfl: 2 .  atorvastatin (LIPITOR) 80 MG tablet, Take 1 tablet (80 mg total) by mouth daily at 6 PM., Disp: 30 tablet, Rfl: 2 .  carvedilol (COREG) 3.125 MG tablet, Take 1 tablet (3.125 mg total) by mouth 2 (two) times daily with a meal., Disp: 60 tablet, Rfl: 1 .  clopidogrel (PLAVIX) 75 MG tablet, Take 1 tablet (75 mg total) by mouth daily with breakfast., Disp: 30 tablet, Rfl: 2 .  eplerenone (INSPRA) 25 MG tablet, Take 1 tablet (25 mg total) by mouth daily., Disp: 30 tablet, Rfl: 6 .  metFORMIN (GLUCOPHAGE) 1000 MG tablet, Take 1 tablet (1,000 mg total) by mouth 2 (two) times daily with a meal., Disp: 60 tablet, Rfl: 1 .  nitroGLYCERIN (NITROSTAT) 0.4 MG SL tablet, Place 1 tablet (0.4 mg total) under the tongue every 5 (five) minutes as needed for chest pain., Disp: 30 tablet, Rfl: 2 .  sacubitril-valsartan (ENTRESTO) 24-26 MG, Take 1 tablet by mouth 2 (two) times daily., Disp: 180 tablet, Rfl: 3  Past Medical History: Past Medical History:  Diagnosis Date  . Chronic systolic heart failure ([place]) 07/2017   a. 07/2017 Echo: EF 30-35%, Gr1 DD, inflat, and inf AK, mild to mod MR, mildly dil LA/RA.  [name] Coronary artery disease 07/2017   a. 07/2017 Late presenting inferior STEMI/Cath: LM nl, LAD min irregs, LCX 100d (L->L collats), RCA 100p (L->R collats), EF  25-35%-->Med Rx.  . Diabetes mellitus without complication ([place])   . Hyperlipidemia   . Ischemic cardiomyopathy    a. 07/2017 Echo: EF 30-35%.  . Tobacco use     Tobacco Use: Social History   Tobacco Use  Smoking Status Current Every Day Smoker  . Packs/day: 0.50  . Types: Cigarettes  Smokeless Tobacco Never Used  Tobacco Comment   09/06/17 Tobacco cessation reviewed. Currently smoking about 10 cigs a day.    Labs: Recent Review Flowsheet Data    Labs for ITP Cardiac and Pulmonary Rehab Latest Ref Rng & Units 07/21/2017 09/12/2017   Cholestrol 0 - 200 mg/dL - 88   LDLCALC 0 - 99 mg/dL - 51   HDL >40 mg/dL - 24(L)   Trlycerides <150 mg/dL - 63   Hemoglobin A1c 4.8 - 5.6 % 11.4(H) -       Exercise Target Goals:    Exercise Program Goal: Individual exercise prescription set using results from initial 6 min walk test and THRR while considering  patient's activity barriers and safety.   Exercise Prescription Goal: Initial exercise prescription builds to 30-45 minutes a day of aerobic activity, 2-3 days per week.  Home exercise guidelines will be given to patient during program as part of exercise prescription that the participant will acknowledge.  Activity Barriers & Risk Stratification: Activity Barriers & Cardiac Risk Stratification - 08/09/17 1232  Activity Barriers & Cardiac Risk Stratification   Activity Barriers  None    Cardiac Risk Stratification  High       6 Minute Walk: 6 Minute Walk    Row Name 08/09/17 1341 10/08/17 1737       6 Minute Walk   Phase  -  Discharge    Distance  1200 feet  1290 feet    Distance % Change  -  7.5 %    Distance Feet Change  -  90 ft    Walk Time  6 minutes  6 minutes    # of Rest Breaks  0  0    MPH  2.27  2.44    METS  3.18  3.75    RPE  13  13    Perceived Dyspnea   2  -    VO2 Peak  11.12  13.12    Symptoms  No  No    Resting HR  88 bpm  87 bpm    Resting BP  98/56  130/82    Exercise Oxygen Saturation   during 6 min walk  98 %  -    Max Ex. HR  120 bpm  118 bpm    Max Ex. BP  102/58  160/80    2 Minute Post BP  92/48  -       Oxygen Initial Assessment:   Oxygen Re-Evaluation:   Oxygen Discharge (Final Oxygen Re-Evaluation):   Initial Exercise Prescription: Initial Exercise Prescription - 08/09/17 1300      Date of Initial Exercise RX and Referring Provider   Date  08/09/17    Referring Provider  Arida      Treadmill   MPH  2.2    Grade  1    Minutes  15    METs  3      Recumbant Bike   Level  3    RPM  60    Watts  34    Minutes  15    METs  3      REL-XR   Level  3    Speed  50    Minutes  15      T5 Nustep   Level  2    SPM  80    Minutes  15    METs  3      Prescription Details   Frequency (times per week)  3    Duration  Progress to 45 minutes of aerobic exercise without signs/symptoms of physical distress      Intensity   THRR 40-80% of Max Heartrate  116-144    Ratings of Perceived Exertion  11-13    Perceived Dyspnea  0-4      Resistance Training   Training Prescription  Yes    Weight  4lb    Reps  10-15       Perform Capillary Blood Glucose checks as needed.  Exercise Prescription Changes: Exercise Prescription Changes    Row Name 08/29/17 1200 09/11/17 1500 09/19/17 1700 09/26/17 1200 10/09/17 1100     Response to Exercise   Blood Pressure (Admit)  130/58  122/68  122/68  132/64  130/82   Blood Pressure (Exercise)  158/80  142/68  142/68  140/80  160/80   Blood Pressure (Exit)  138/62  114/66  114/66  102/60  120/68   Heart Rate (Admit)  88 bpm  98 bpm  98 bpm  80 bpm  80  bpm   Heart Rate (Exercise)  127 bpm  120 bpm  120 bpm  125 bpm  128 bpm   Heart Rate (Exit)  90 bpm  87 bpm  87 bpm  891 bpm  87 bpm   Rating of Perceived Exertion (Exercise)  _0 Symptoms  none  none  none  none  none   Duration  Progress to 45 minutes of aerobic exercise without signs/symptoms of physical distress  Continue with 45 min of  aerobic exercise without signs/symptoms of physical distress.  Continue with 45 min of aerobic exercise without signs/symptoms of physical distress.  Continue with 45 min of aerobic exercise without signs/symptoms of physical distress.  Continue with 45 min of aerobic exercise without signs/symptoms of physical distress.   Intensity  THRR unchanged  THRR unchanged  THRR unchanged  THRR unchanged  THRR unchanged     Progression   Progression  Continue to progress workloads to maintain intensity without signs/symptoms of physical distress.  Continue to progress workloads to maintain intensity without signs/symptoms of physical distress.  Continue to progress workloads to maintain intensity without signs/symptoms of physical distress.  Continue to progress workloads to maintain intensity without signs/symptoms of physical distress.  Continue to progress workloads to maintain intensity without signs/symptoms of physical distress.   Average METs  -  -  -  3  3.75 post walk     Resistance Training   Training Prescription  Yes  Yes  Yes  Yes  No   Weight  4 lb  4 lb  4 lb  4 lb  4 lb   Reps  10-15  10-15  10-15  10-15  10-15     Interval Training   Interval Training  -  -  -  No  -     Treadmill   MPH  -  2.2  2.2  2.2  -   Grade  -  1  1  1.5  -   Minutes  -  _1 -   METs  -  _2 -     Recumbant Bike   Level  3  -  -  -  -   RPM  60  -  -  -  -   Minutes  15  -  -  -  -     REL-XR   Level  -  -  -  3  -   Speed  -  -  -  50  -   Minutes  -  -  -  2.9  -     T5 Nustep   Level  _3 -  2   SPM  80  80  80  -  80   Minutes  _4 -  15   METs  -  2.1  2.1  -  2     Home Exercise Plan   Plans to continue exercise at  -  -  [employer] (comment)  [employer] (comment)  [employer] (comment)   Frequency  -  -  Add 1 additional day to program exercise sessions.  Add 1 additional day to program exercise sessions.  Add 1 additional day to program  exercise sessions.   Initial Home Exercises Provided  -  -  09/19/17  09/19/17  09/19/17      Exercise Comments: Exercise Comments    Row Name 08/27/17 1741 09/19/17 1740         Exercise Comments  First full day of exercise!  Patient was oriented to gym and equipment including functions, settings, policies, and procedures.  Patient's individual exercise prescription and treatment plan were reviewed.  All starting workloads were established based on the results of the 6 minute walk test done at initial orientation visit.  The plan for exercise progression was also introduced and progression will be customized based on patient's performance and goals   Reviewed home exercise with pt today.  Pt plans to consider joining a gym for exercise.  Reviewed THR, pulse, RPE, sign and symptoms, NTG use, and when to call 911 or MD.  Also discussed weather considerations and indoor options.  Pt voiced understandi         Exercise Goals and Review: Exercise Goals    Row Name 08/09/17 1340             Exercise Goals   Increase Physical Activity  Yes       Intervention  Provide advice, education, support and counseling about physical activity/exercise needs.;Develop an individualized exercise prescription for aerobic and resistive training based on initial evaluation findings, risk stratification, comorbidities and participant's personal goals.       Expected Outcomes  Achievement of increased cardiorespiratory fitness and enhanced flexibility, muscular endurance and strength shown through measurements of functional capacity and personal statement of participant.       Increase Strength and Stamina  Yes       Intervention  Provide advice, education, support and counseling about physical activity/exercise needs.;Develop an individualized exercise prescription for aerobic and resistive training based on initial evaluation findings, risk stratification, comorbidities and participant's personal goals.        Expected Outcomes  Achievement of increased cardiorespiratory fitness and enhanced flexibility, muscular endurance and strength shown through measurements of functional capacity and personal statement of participant.       Able to understand and use rate of perceived exertion (RPE) scale  Yes       Intervention  Provide education and explanation on how to use RPE scale       Expected Outcomes  Short Term: Able to use RPE daily in rehab to express subjective intensity level;Long Term:  Able to use RPE to guide intensity level when exercising independently       Able to understand and use Dyspnea scale  Yes       Intervention  Provide education and explanation on how to use Dyspnea scale       Expected Outcomes  Short Term: Able to use Dyspnea scale daily in rehab to express subjective sense of shortness of breath during exertion;Long Term: Able to use Dyspnea scale to guide intensity level when exercising independently       Knowledge and understanding of Target Heart Rate Range (THRR)  Yes       Intervention  Provide education and explanation of THRR including how the numbers were predicted and where they are located for reference       Expected Outcomes  Short Term: Able to state/look up THRR;Long Term: Able to use THRR to govern intensity when exercising independently;Short Term: Able to use daily as guideline for intensity in rehab       Able to check pulse independently  Yes       Intervention  Provide education and  demonstration on how to check pulse in carotid and radial arteries.;Review the importance of being able to check your own pulse for safety during independent exercise       Expected Outcomes  Short Term: Able to explain why pulse checking is important during independent exercise;Long Term: Able to check pulse independently and accurately       Understanding of Exercise Prescription  Yes       Intervention  Provide education, explanation, and written materials on patient's individual  exercise prescription       Expected Outcomes  Short Term: Able to explain program exercise prescription;Long Term: Able to explain home exercise prescription to exercise independently          Exercise Goals Re-Evaluation : Exercise Goals Re-Evaluation    Row Name 09/06/17 1635 09/11/17 1526 09/19/17 1740 09/26/17 1216 10/08/17 1739     Exercise Goal Re-Evaluation   Exercise Goals Review  Increase Physical Activity;Increase Strength and Stamina;Knowledge and understanding of Target Heart Rate Range (THRR);Understanding of Exercise Prescription;Able to check pulse independently  Increase Physical Activity;Able to understand and use rate of perceived exertion (RPE) scale;Increase Strength and Stamina;Knowledge and understanding of Target Heart Rate Range (THRR)  Increase Physical Activity;Increase Strength and Stamina;Able to understand and use rate of perceived exertion (RPE) scale;Knowledge and understanding of Target Heart Rate Range (THRR);Able to check pulse independently;Understanding of Exercise Prescription  Increase Physical Activity;Increase Strength and Stamina;Able to understand and use Dyspnea scale;Able to understand and use rate of perceived exertion (RPE) scale  Increase Physical Activity;Increase Strength and Stamina   Comments  Ashby stated that he felt he was getting stronger and making progress with his exercise routine.   Jaevion is progressing well with exercise.  He is still working on readiness to quit smoking.   Reviewed home exercise with pt today.  Pt plans to consider joining a gym for exercise.  Reviewed THR, pulse, RPE, sign and symptoms, NTG use, and when to call 911 or MD.  Also discussed weather considerations and indoor options.  Pt voiced understandi  Dariush is progressing well with exercise.  Staff will introduce interval training.  6 min walk test done today. Results reviewed with patinet. See detailed report on 6 min walk data sheet. Plans to join [employer] upon  graduation.    Expected Outcomes  Short: Attend [facility] regularly Long: Adopt a healthy exercise and physical activity routine to do indpendently.   Short - Teagon will continue to exercise at [facility] Long - Ari will exercise independently  Short - Alekai will ad one day of exercise in addition to program sessions  Long - Keilen will continue exercise on his own  Short - Trung wil add intervals to his program.  Long - Boris will continue to improve MET levels  -   Row Name 10/09/17 1134             Exercise Goal Re-Evaluation   Exercise Goals Review  Increase Physical Activity;Increase Strength and Stamina;Able to understand and use rate of perceived exertion (RPE) scale       Comments  Harding increased MET level from 3.18 on pre walk to 3.75 on post walk test.       Expected Outcomes  Short - Robel will complete HT program Long - Stephenson will maintain fitness on his own           Discharge Exercise Prescription (Final Exercise Prescription Changes): Exercise Prescription Changes - 10/09/17 1100      Response to  Exercise   Blood Pressure (Admit)  130/82    Blood Pressure (Exercise)  160/80    Blood Pressure (Exit)  120/68    Heart Rate (Admit)  80 bpm    Heart Rate (Exercise)  128 bpm    Heart Rate (Exit)  87 bpm    Rating of Perceived Exertion (Exercise)  13    Symptoms  none    Duration  Continue with 45 min of aerobic exercise without signs/symptoms of physical distress.    Intensity  THRR unchanged      Progression   Progression  Continue to progress workloads to maintain intensity without signs/symptoms of physical distress.    Average METs  3.75 post walk      Resistance Training   Training Prescription  No    Weight  4 lb    Reps  10-15      T5 Nustep   Level  2    SPM  80    Minutes  15    METs  2      Home Exercise Plan   Plans to continue exercise at  [facility] (comment)    Frequency  Add 1 additional day to program exercise sessions.    Initial Home Exercises Provided   09/19/17       Nutrition:  Target Goals: Understanding of nutrition guidelines, daily intake of sodium <1579m, cholesterol <208m calories 30% from fat and 7% or less from saturated fats, daily to have 5 or more servings of fruits and vegetables.  Biometrics: Pre Biometrics - 08/09/17 1340      Pre Biometrics   Height  6' (1.829 m)    Weight  202 lb 4.8 oz (91.8 kg)    Waist Circumference  40 inches    Hip Circumference  42 inches    Waist to Hip Ratio  0.95 %    BMI (Calculated)  27.43    Single Leg Stand  21.42 seconds        Nutrition Therapy Plan and Nutrition Goals: Nutrition Therapy & Goals - 09/27/17 1615      Nutrition Therapy   RD appointment deferred  Yes       Nutrition Assessments: Nutrition Assessments - 08/09/17 1415      MEDFICTS Scores   Pre Score  -- pt did not complete, sent home to bring back first day.        Nutrition Goals Re-Evaluation: Nutrition Goals Re-Evaluation    RoKing Williamame 09/06/17 1632             Goals   Comment  Jebediah is currently not interested in meeting with the dietician idividulally, but does want to listen to the group lectures on nutrician when they rotate though the group education cycle.        Expected Outcome  Short: Nasri will be present at group nutician lecture. Long: Takari will use knowledge gained to adopt a heart healthy eating plan.           Nutrition Goals Discharge (Final Nutrition Goals Re-Evaluation): Nutrition Goals Re-Evaluation - 09/06/17 1632      Goals   Comment  Jeshurun is currently not interested in meeting with the dietician idividulally, but does want to listen to the group lectures on nutrician when they rotate though the group education cycle.     Expected Outcome  Short: Dayna will be present at group nutician lecture. Long: Othmar will use knowledge gained to adopt a heart healthy eating plan.  Psychosocial: Target Goals: Acknowledge presence or absence of significant depression and/or stress,  maximize coping skills, provide positive support system. Participant is able to verbalize types and ability to use techniques and skills needed for reducing stress and depression.   Initial Review & Psychosocial Screening: Initial Psych Review & Screening - 08/09/17 1235      Initial Review   Current issues with  Current Stress Concerns    Source of Stress Concerns  Financial      Family Dynamics   Good Support System?  Yes      Barriers   Psychosocial barriers to participate in program  The patient should benefit from training in stress management and relaxation.;Psychosocial barriers identified (see note)      Screening Interventions   Interventions  Encouraged to exercise;Yes;Program counselor consult    Expected Outcomes  Short Term goal: Utilizing psychosocial counselor, staff and physician to assist with identification of specific Stressors or current issues interfering with healing process. Setting desired goal for each stressor or current issue identified.;Long Term Goal: Stressors or current issues are controlled or eliminated.       Quality of Life Scores:  Quality of Life - 08/09/17 1150      Quality of Life Scores   Health/Function Pre  24.8 %    Socioeconomic Pre  28.5 %    Psych/Spiritual Pre  30 %    Family Pre  27.6 %    GLOBAL Pre  27.09 %      Scores of 19 and below usually indicate a poorer quality of life in these areas.  A difference of  2-3 points is a clinically meaningful difference.  A difference of 2-3 points in the total score of the Quality of Life Index has been associated with significant improvement in overall quality of life, self-image, physical symptoms, and general health in studies assessing change in quality of life.  PHQ-9: Recent Review Flowsheet Data    Depression screen [facility] 2/9 08/09/2017   Decreased Interest 0   Down, Depressed, Hopeless 0   PHQ - 2 Score 0   Altered sleeping 0   Tired, decreased energy 0   Change in appetite 0    Feeling bad or failure about yourself  0   Trouble concentrating 0   Moving slowly or fidgety/restless 0   Suicidal thoughts 0   PHQ-9 Score 0     Interpretation of Total Score  Total Score Depression Severity:  1-4 = Minimal depression, 5-9 = Mild depression, 10-14 = Moderate depression, 15-19 = Moderately severe depression, 20-27 = Severe depression   Psychosocial Evaluation and Intervention: Psychosocial Evaluation - 08/27/17 1732      Psychosocial Evaluation & Interventions   Interventions  Encouraged to exercise with the program and follow exercise prescription;Stress management education    Comments  Counselor met with Lawrence Holmes  [facility]) today for initial psychosocial evaluation.  He is a 61 year old who had a heart attack on 12/1.  He lives alone but has a strong wupport system with a sister and brother who live locally and Tabari is actively involved in his local church.  Tejay considers himself pretty healthy except for this cardiac incident and he has diabetes.  He sleeps well and has a good appetite.  He denies a history of depression or anxiety and reports minimal stress in his life other than not being able to return to work yet. His goals are to increase his stamina and strength while in this program.  Dezmon is a positive person and appears to be very well-adjusted.  Staff will follow with him throughout the course of this program.    Expected Outcomes  Halo will benefit from consistent exercise to achieve his stated goals.  The educational and psychoeducational components of this program will help him learn more about his condition and positive ways to cope with it.      Continue Psychosocial Services   Follow up required by staff       Psychosocial Re-Evaluation: Psychosocial Re-Evaluation    [place] Name 09/10/17 1726 09/27/17 1614           Psychosocial Re-Evaluation   Current issues with  Current Stress Concerns  None Identified      Comments  Pt. has returned to work but  doesn't feel like it has added stress.   Yeng reports no stressors at this time      Expected Outcomes  -  Markon will maintain exercise to help reduce stress.      Interventions  -  Encouraged to attend Cardiac Rehabilitation for the exercise      Continue Psychosocial Services   -  Follow up required by staff         Psychosocial Discharge (Final Psychosocial Re-Evaluation): Psychosocial Re-Evaluation - 09/27/17 1614      Psychosocial Re-Evaluation   Current issues with  None Identified    Comments  Tavaris reports no stressors at this time    Expected Outcomes  Obryan will maintain exercise to help reduce stress.    Interventions  Encouraged to attend Cardiac Rehabilitation for the exercise    Continue Psychosocial Services   Follow up required by staff       Vocational Rehabilitation: Provide vocational rehab assistance to qualifying candidates.   Vocational Rehab Evaluation & Intervention: Vocational Rehab - 08/09/17 1150      Initial Vocational Rehab Evaluation & Intervention   Assessment shows need for Vocational Rehabilitation  No       Education: Education Goals: Education classes will be provided on a variety of topics geared toward better understanding of heart health and risk factor modification. Participant will state understanding/return demonstration of topics presented as noted by education test scores.  Learning Barriers/Preferences: Learning Barriers/Preferences - 08/09/17 1231      Learning Barriers/Preferences   Learning Barriers  None    Learning Preferences  Individual Instruction       Education Topics:  AED/CPR: - Group verbal and written instruction with the use of models to demonstrate the basic use of the AED with the basic ABC's of resuscitation.   Cardiac Rehab from 10/15/2017 in [facility] Cardiac and Pulmonary Rehab  Date  10/01/17  Educator  MA  Instruction Review Code  1- Verbalizes Understanding      General Nutrition Guidelines/Fats and  Fiber: -Group instruction provided by verbal, written material, models and posters to present the general guidelines for heart healthy nutrition. Gives an explanation and review of dietary fats and fiber.   Cardiac Rehab from 10/15/2017 in [facility] Cardiac and Pulmonary Rehab  Date  09/17/17  Educator  PI  Instruction Review Code  1- Verbalizes Understanding      Controlling Sodium/Reading Food Labels: -Group verbal and written material supporting the discussion of sodium use in heart healthy nutrition. Review and explanation with models, verbal and written materials for utilization of the food label.   Cardiac Rehab from 10/15/2017 in [facility] Cardiac and Pulmonary Rehab  Date  09/24/17  Educator  PI  Instruction Review Code  1- Verbalizes Understanding      Exercise Physiology & General Exercise Guidelines: - Group verbal and written instruction with models to review the exercise physiology of the cardiovascular system and associated critical values. Provides general exercise guidelines with specific guidelines to those with heart or lung disease.    Cardiac Rehab from 10/15/2017 in [facility] Cardiac and Pulmonary Rehab  Date  10/08/17  Educator  [facility]  Instruction Review Code  1- Verbalizes Understanding      Aerobic Exercise & Resistance Training: - Gives group verbal and written instruction on the various components of exercise. Focuses on aerobic and resistive training programs and the benefits of this training and how to safely progress through these programs..   Cardiac Rehab from 10/15/2017 in [facility] Cardiac and Pulmonary Rehab  Date  10/15/17  Educator  AS  Instruction Review Code  1- Verbalizes Understanding      Flexibility, Balance, Mind/Body Relaxation: Provides group verbal/written instruction on the benefits of flexibility and balance training, including mind/body exercise modes such as yoga, pilates and tai chi.  Demonstration and skill practice provided.   Cardiac Rehab from  10/15/2017 in [facility] Cardiac and Pulmonary Rehab  Date  08/27/17  Educator  AS  Instruction Review Code  1- Verbalizes Understanding      Stress and Anxiety: - Provides group verbal and written instruction about the health risks of elevated stress and causes of high stress.  Discuss the correlation between heart/lung disease and anxiety and treatment options. Review healthy ways to manage with stress and anxiety.   Cardiac Rehab from 10/15/2017 in [facility] Cardiac and Pulmonary Rehab  Date  09/12/17  Educator  [facility]  Instruction Review Code  1- Verbalizes Understanding      Depression: - Provides group verbal and written instruction on the correlation between heart/lung disease and depressed mood, treatment options, and the stigmas associated with seeking treatment.   Cardiac Rehab from 10/15/2017 in [facility] Cardiac and Pulmonary Rehab  Date  10/10/17  Educator  [facility]  Instruction Review Code  1- Verbalizes Understanding      Anatomy & Physiology of the Heart: - Group verbal and written instruction and models provide basic cardiac anatomy and physiology, with the coronary electrical and arterial systems. Review of Valvular disease and Heart Failure   Cardiac Rehab from 10/15/2017 in [facility] Cardiac and Pulmonary Rehab  Date  09/10/17  Educator  CE  Instruction Review Code  1- Verbalizes Understanding      Cardiac Procedures: - Group verbal and written instruction to review commonly prescribed medications for heart disease. Reviews the medication, class of the drug, and side effects. Includes the steps to properly store meds and maintain the prescription regimen. (beta blockers and nitrates)   Cardiac Rehab from 10/15/2017 in [facility] Cardiac and Pulmonary Rehab  Date  09/19/17  Educator  [facility]  Instruction Review Code  1- Verbalizes Understanding      Cardiac Medications I: - Group verbal and written instruction to review commonly prescribed medications for heart disease. Reviews the medication, class  of the drug, and side effects. Includes the steps to properly store meds and maintain the prescription regimen.   Cardiac Rehab from 10/15/2017 in [facility] Cardiac and Pulmonary Rehab  Date  09/03/17  Educator  [facility]  Instruction Review Code  1- Verbalizes Understanding      Cardiac Medications II: -Group verbal and written instruction to review commonly prescribed medications for heart disease. Reviews the medication, class of the drug, and  side effects. (all other drug classes)   Cardiac Rehab from 10/15/2017 in [facility] Cardiac and Pulmonary Rehab  Date  08/29/17  Educator  [facility]  Instruction Review Code  1- Verbalizes Understanding       Go Sex-Intimacy & Heart Disease, Get SMART - Goal Setting: - Group verbal and written instruction through game format to discuss heart disease and the return to sexual intimacy. Provides group verbal and written material to discuss and apply goal setting through the application of the S.M.A.R.T. Method.   Cardiac Rehab from 10/15/2017 in [facility] Cardiac and Pulmonary Rehab  Date  09/19/17  Educator  [facility]  Instruction Review Code  1- Verbalizes Understanding      Other Matters of the Heart: - Provides group verbal, written materials and models to describe Stable Angina and Peripheral Artery. Includes description of the disease process and treatment options available to the cardiac patient.   Exercise & Equipment Safety: - Individual verbal instruction and demonstration of equipment use and safety with use of the equipment.   Cardiac Rehab from 10/15/2017 in [facility] Cardiac and Pulmonary Rehab  Date  08/09/17  Educator  C. Enterkin, RN  Instruction Review Code  3- Needs Reinforcement      Infection Prevention: - Provides verbal and written material to individual with discussion of infection control including proper hand washing and proper equipment cleaning during exercise session.   Cardiac Rehab from 10/15/2017 in [facility] Cardiac and Pulmonary Rehab  Date  08/09/17   Educator  C. EnterkinRN  Instruction Review Code  1- Verbalizes Understanding      Falls Prevention: - Provides verbal and written material to individual with discussion of falls prevention and safety.   Cardiac Rehab from 10/15/2017 in [facility] Cardiac and Pulmonary Rehab  Date  08/09/17  Educator  C. Fellows  Instruction Review Code  2- Demonstrated Understanding      Diabetes: - Individual verbal and written instruction to review signs/symptoms of diabetes, desired ranges of glucose level fasting, after meals and with exercise. Acknowledge that pre and post exercise glucose checks will be done for 3 sessions at entry of program.   Cardiac Rehab from 10/15/2017 in [facility] Cardiac and Pulmonary Rehab  Date  08/09/17  Educator  C. EnterkinRN  Instruction Review Code  1- Verbalizes Understanding      Know Your Numbers and Risk Factors: -Group verbal and written instruction about important numbers in your health.  Discussion of what are risk factors and how they play a role in the disease process.  Review of Cholesterol, Blood Pressure, Diabetes, and BMI and the role they play in your overall health.   Cardiac Rehab from 10/15/2017 in [facility] Cardiac and Pulmonary Rehab  Date  08/29/17  Educator  [facility]  Instruction Review Code  1- Verbalizes Understanding      Sleep Hygiene: -Provides group verbal and written instruction about how sleep can affect your health.  Define sleep hygiene, discuss sleep cycles and impact of sleep habits. Review good sleep hygiene tips.    Cardiac Rehab from 10/15/2017 in [facility] Cardiac and Pulmonary Rehab  Date  09/26/17  Educator  [facility]  Instruction Review Code  1- Verbalizes Understanding      Other: -Provides group and verbal instruction on various topics (see comments)   Knowledge Questionnaire Score: Knowledge Questionnaire Score - 08/09/17 1152      Knowledge Questionnaire Score   Pre Score  13/28       Core Components/Risk Factors/Patient Goals at  Admission: Personal  Goals and Risk Factors at Admission - 08/09/17 1233      Core Components/Risk Factors/Patient Goals on Admission    Weight Management  Yes;Weight Loss    Intervention  Weight Management: Develop a combined nutrition and exercise program designed to reach desired caloric intake, while maintaining appropriate intake of nutrient and fiber, sodium and fats, and appropriate energy expenditure required for the weight goal.;Weight Management: Provide education and appropriate resources to help participant work on and attain dietary goals.    Admit Weight  202 lb 6.1 oz (91.8 kg)    Goal Weight: Short Term  197 lb (89.4 kg)    Goal Weight: Long Term  192 lb (87.1 kg)    Expected Outcomes  Short Term: Continue to assess and modify interventions until short term weight is achieved;Long Term: Adherence to nutrition and physical activity/exercise program aimed toward attainment of established weight goal;Weight Loss: Understanding of general recommendations for a balanced deficit meal plan, which promotes 1-2 lb weight loss per week and includes a negative energy balance of [phone] kcal/d;Understanding recommendations for meals to include 15-35% energy as protein, 25-35% energy from fat, 35-60% energy from carbohydrates, less than 260m of dietary cholesterol, 20-35 gm of total fiber daily;Understanding of distribution of calorie intake throughout the day with the consumption of 4-5 meals/snacks    Tobacco Cessation  Yes    Number of packs per day  0.25    Intervention  -- Snyder said he will quit the first of Jan     Expected Outcomes  Short Term: Will demonstrate readiness to quit, by selecting a quit date.;Long Term: Complete abstinence from all tobacco products for at least 12 months from quit date.;Short Term: Will quit all tobacco product use, adhering to prevention of relapse plan.    Diabetes  Yes    Intervention  Provide education about signs/symptoms and action to take for  hypo/hyperglycemia.;Provide education about proper nutrition, including hydration, and aerobic/resistive exercise prescription along with prescribed medications to achieve blood glucose in normal ranges: Fasting glucose 65-99 mg/dL    Expected Outcomes  Short Term: Participant verbalizes understanding of the signs/symptoms and immediate care of hyper/hypoglycemia, proper foot care and importance of medication, aerobic/resistive exercise and nutrition plan for blood glucose control.;Long Term: Attainment of HbA1C < 7%.    Hypertension  Yes    Intervention  Provide education on lifestyle modifcations including regular physical activity/exercise, weight management, moderate sodium restriction and increased consumption of fresh fruit, vegetables, and low fat dairy, alcohol moderation, and smoking cessation.;Monitor prescription use compliance.    Expected Outcomes  Short Term: Continued assessment and intervention until BP is < 140/951mHG in hypertensive participants. < 130/8055mG in hypertensive participants with diabetes, heart failure or chronic kidney disease.;Long Term: Maintenance of blood pressure at goal levels.    Stress  Yes    Intervention  Offer individual and/or small group education and counseling on adjustment to heart disease, stress management and health-related lifestyle change. Teach and support self-help strategies.;Refer participants experiencing significant psychosocial distress to appropriate mental health specialists for further evaluation and treatment. When possible, include family members and significant others in education/counseling sessions.    Expected Outcomes  Short Term: Participant demonstrates changes in health-related behavior, relaxation and other stress management skills, ability to obtain effective social support, and compliance with psychotropic medications if prescribed.;Long Term: Emotional wellbeing is indicated by absence of clinically significant psychosocial  distress or social isolation.       Core Components/Risk Factors/Patient Goals Review:  Goals and Risk Factor Review    Row Name 09/06/17 1628 09/10/17 1653 09/27/17 1609         Core Components/Risk Factors/Patient Goals Review   Personal Goals Review  Weight Management/Obesity;Tobacco Cessation;Diabetes;Hypertension;Lipids  Tobacco Cessation;Hypertension;Lipids;Diabetes  Tobacco Cessation;Weight Management/Obesity;Lipids;Diabetes;Hypertension     Review  Akira stated that he is trying to maintain weight and gain strength after losing 10 lbs when sick. He reports blood sugar, lipids, and blood pressure have all been managed and he is taking all meds. He is still smoking about 10 cigarettes a day, which is about a pack less then he used to smoke.   Tobacco use is about the same.  He has info about the [employer].  His Dr appt this week is Wed.  Fasting BG has been 98.  He is taking meds as prescribed for BP and lipids  He would like to get back to 215 lb .He feels he is getting stronger "one lb at a time"  Bret reports he is still smoking - he is taking 2 puffs and then throwing the cigarette out.  BG is still in the 90s fasting.  He feels "better than he has in 3 years."  He is eating more fruit and vegetables     Expected Outcomes  Short: Mirza will have updated lab work next week to evaluate where his numbers currently stand. Long: Gain strength and maintain weight through exercise program and decrease number of cigarettes smoked per day.   Short - Nehemiah will get results of current bloodwork and follow Dr instructions.  He is not ready to quit smoking at this time.  Long - Froylan will reach weight and strength/stamina goals.  Short - He will go 24 hours without smoking Long - quit smoking altogether and continue healthy eating        Core Components/Risk Factors/Patient Goals at Discharge (Final Review):  Goals and Risk Factor Review - 09/27/17 1609      Core Components/Risk Factors/Patient  Goals Review   Personal Goals Review  Tobacco Cessation;Weight Management/Obesity;Lipids;Diabetes;Hypertension    Review  Kaulana reports he is still smoking - he is taking 2 puffs and then throwing the cigarette out.  BG is still in the 90s fasting.  He feels "better than he has in 3 years."  He is eating more fruit and vegetables    Expected Outcomes  Short - He will go 24 hours without smoking Long - quit smoking altogether and continue healthy eating       ITP Comments: ITP Comments    Row Name 08/09/17 1154 08/09/17 1233 08/22/17 1027 09/19/17 0636 10/17/17 0600   ITP Comments  ITP created during Medical Review after the Cardiac Rehab Informed consent was signed by Talmage [name]. Diagnosis documented hospital discharge note 07/23/2017.  Keyshaun said his plan is to quit smoking the first of January since "I feel a lot better now that I have cut back smoking after my heart attack". 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 Haseeb reports that normally his HBA1c is 11 "since I have been eating bad lately but normally it is in the 7 range".   30 day review. Continue with ITP unless directed changes per Medical Director review.   HAs not attended since 08/09/2017  30 Day review. Continue with ITP unless directed changes per Medical Director review.  30 day review. Continue with ITP unless directed changes per Medical Director review.      Comments:

## 2017-10-17 NOTE — Progress Notes (Signed)
Discharge Progress Report  Patient Details  Name: Lawrence Holmes MRN: [id number] Date of Birth: February 11, 1956 Referring Provider:     Cardiac Rehab from 08/09/2017 in [facility]  Referring Provider  [place]       Number of Visits: 47  Reason for Discharge:  Patient reached a stable level of exercise. Patient independent in their exercise. Patient has met program and personal goals.  Smoking History:  Social History   Tobacco Use  Smoking Status Current Every Day Smoker  . Packs/day: 0.50  . Types: Cigarettes  Smokeless Tobacco Never Used  Tobacco Comment   09/06/17 Tobacco cessation reviewed. Currently smoking about 10 cigs a day.    Diagnosis:  ST elevation myocardial infarction (STEMI), unspecified artery ([place])  ADL UCSD:   Initial Exercise Prescription: Initial Exercise Prescription - 08/09/17 1300      Date of Initial Exercise RX and Referring Provider   Date  08/09/17    Referring Provider  Arida      Treadmill   MPH  2.2    Grade  1    Minutes  15    METs  3      Recumbant Bike   Level  3    RPM  60    Watts  34    Minutes  15    METs  3      REL-XR   Level  3    Speed  50    Minutes  15      T5 Nustep   Level  2    SPM  80    Minutes  15    METs  3      Prescription Details   Frequency (times per week)  3    Duration  Progress to 45 minutes of aerobic exercise without signs/symptoms of physical distress      Intensity   THRR 40-80% of Max Heartrate  116-144    Ratings of Perceived Exertion  11-13    Perceived Dyspnea  0-4      Resistance Training   Training Prescription  Yes    Weight  4lb    Reps  10-15       Discharge Exercise Prescription (Final Exercise Prescription Changes): Exercise Prescription Changes - 10/09/17 1100      Response to Exercise   Blood Pressure (Admit)  130/82    Blood Pressure (Exercise)  160/80    Blood Pressure (Exit)  120/68    Heart Rate (Admit)  80 bpm    Heart Rate (Exercise)   128 bpm    Heart Rate (Exit)  87 bpm    Rating of Perceived Exertion (Exercise)  13    Symptoms  none    Duration  Continue with 45 min of aerobic exercise without signs/symptoms of physical distress.    Intensity  THRR unchanged      Progression   Progression  Continue to progress workloads to maintain intensity without signs/symptoms of physical distress.    Average METs  3.75 post walk      Resistance Training   Training Prescription  No    Weight  4 lb    Reps  10-15      T5 Nustep   Level  2    SPM  80    Minutes  15    METs  2      Home Exercise Plan   Plans to continue exercise at  [facility] (comment)  Frequency  Add 1 additional day to program exercise sessions.    Initial Home Exercises Provided  09/19/17       Functional Capacity: 6 Minute Walk    Row Name 08/09/17 1341 10/08/17 1737       6 Minute Walk   Phase  -  Discharge    Distance  1200 feet  1290 feet    Distance % Change  -  7.5 %    Distance Feet Change  -  90 ft    Walk Time  6 minutes  6 minutes    # of Rest Breaks  0  0    MPH  2.27  2.44    METS  3.18  3.75    RPE  13  13    Perceived Dyspnea   2  -    VO2 Peak  11.12  13.12    Symptoms  No  No    Resting HR  88 bpm  87 bpm    Resting BP  98/56  130/82    Exercise Oxygen Saturation  during 6 min walk  98 %  -    Max Ex. HR  120 bpm  118 bpm    Max Ex. BP  102/58  160/80    2 Minute Post BP  92/48  -       Psychological, QOL, Others - Outcomes: PHQ 2/9: Depression screen [facility] 2/9 10/17/2017 08/09/2017  Decreased Interest 0 0  Down, Depressed, Hopeless 0 0  PHQ - 2 Score 0 0  Altered sleeping 0 0  Tired, decreased energy 0 0  Change in appetite 0 0  Feeling bad or failure about yourself  0 0  Trouble concentrating 0 0  Moving slowly or fidgety/restless 0 0  Suicidal thoughts 0 0  PHQ-9 Score 0 0    Quality of Life: Quality of Life - 10/17/17 1805      Quality of Life Scores   Health/Function Post  22.63 %     Socioeconomic Post  23.07 %    Psych/Spiritual Post  28.93 %    Family Post  24 %    GLOBAL Post  24.22 %       Personal Goals: Goals established at orientation with interventions provided to work toward goal. Personal Goals and Risk Factors at Admission - 08/09/17 1233      Core Components/Risk Factors/Patient Goals on Admission    Weight Management  Yes;Weight Loss    Intervention  Weight Management: Develop a combined nutrition and exercise program designed to reach desired caloric intake, while maintaining appropriate intake of nutrient and fiber, sodium and fats, and appropriate energy expenditure required for the weight goal.;Weight Management: Provide education and appropriate resources to help participant work on and attain dietary goals.    Admit Weight  202 lb 6.1 oz (91.8 kg)    Goal Weight: Short Term  197 lb (89.4 kg)    Goal Weight: Long Term  192 lb (87.1 kg)    Expected Outcomes  Short Term: Continue to assess and modify interventions until short term weight is achieved;Long Term: Adherence to nutrition and physical activity/exercise program aimed toward attainment of established weight goal;Weight Loss: Understanding of general recommendations for a balanced deficit meal plan, which promotes 1-2 lb weight loss per week and includes a negative energy balance of [phone] kcal/d;Understanding recommendations for meals to include 15-35% energy as protein, 25-35% energy from fat, 35-60% energy from carbohydrates, less than 229m of dietary  cholesterol, 20-35 gm of total fiber daily;Understanding of distribution of calorie intake throughout the day with the consumption of 4-5 meals/snacks    Tobacco Cessation  Yes    Number of packs per day  0.25    Intervention  -- Yohann said he will quit the first of Jan     Expected Outcomes  Short Term: Will demonstrate readiness to quit, by selecting a quit date.;Long Term: Complete abstinence from all tobacco products for at least 12 months from  quit date.;Short Term: Will quit all tobacco product use, adhering to prevention of relapse plan.    Diabetes  Yes    Intervention  Provide education about signs/symptoms and action to take for hypo/hyperglycemia.;Provide education about proper nutrition, including hydration, and aerobic/resistive exercise prescription along with prescribed medications to achieve blood glucose in normal ranges: Fasting glucose 65-99 mg/dL    Expected Outcomes  Short Term: Participant verbalizes understanding of the signs/symptoms and immediate care of hyper/hypoglycemia, proper foot care and importance of medication, aerobic/resistive exercise and nutrition plan for blood glucose control.;Long Term: Attainment of HbA1C < 7%.    Hypertension  Yes    Intervention  Provide education on lifestyle modifcations including regular physical activity/exercise, weight management, moderate sodium restriction and increased consumption of fresh fruit, vegetables, and low fat dairy, alcohol moderation, and smoking cessation.;Monitor prescription use compliance.    Expected Outcomes  Short Term: Continued assessment and intervention until BP is < 140/15m HG in hypertensive participants. < 130/871mHG in hypertensive participants with diabetes, heart failure or chronic kidney disease.;Long Term: Maintenance of blood pressure at goal levels.    Stress  Yes    Intervention  Offer individual and/or small group education and counseling on adjustment to heart disease, stress management and health-related lifestyle change. Teach and support self-help strategies.;Refer participants experiencing significant psychosocial distress to appropriate mental health specialists for further evaluation and treatment. When possible, include family members and significant others in education/counseling sessions.    Expected Outcomes  Short Term: Participant demonstrates changes in health-related behavior, relaxation and other stress management skills, ability  to obtain effective social support, and compliance with psychotropic medications if prescribed.;Long Term: Emotional wellbeing is indicated by absence of clinically significant psychosocial distress or social isolation.        Personal Goals Discharge: Goals and Risk Factor Review    Row Name 09/06/17 1628 09/10/17 1653 09/27/17 1609         Core Components/Risk Factors/Patient Goals Review   Personal Goals Review  Weight Management/Obesity;Tobacco Cessation;Diabetes;Hypertension;Lipids  Tobacco Cessation;Hypertension;Lipids;Diabetes  Tobacco Cessation;Weight Management/Obesity;Lipids;Diabetes;Hypertension     Review  Myan stated that he is trying to maintain weight and gain strength after losing 10 lbs when sick. He reports blood sugar, lipids, and blood pressure have all been managed and he is taking all meds. He is still smoking about 10 cigarettes a day, which is about a pack less then he used to smoke.   Tobacco use is about the same.  He has info about the [employer] His Dr appt this week is Wed.  Fasting BG has been 98.  He is taking meds as prescribed for BP and lipids  He would like to get back to 215 lb .He feels he is getting stronger "one lb at a time"  Durell reports he is still smoking - he is taking 2 puffs and then throwing the cigarette out.  BG is still in the 90s fasting.  He feels "better than he has in  3 years."  He is eating more fruit and vegetables     Expected Outcomes  Short: Vallen will have updated lab work next week to evaluate where his numbers currently stand. Long: Gain strength and maintain weight through exercise program and decrease number of cigarettes smoked per day.   Short - Henrick will get results of current bloodwork and follow Dr instructions.  He is not ready to quit smoking at this time.  Long - Leveon will reach weight and strength/stamina goals.  Short - He will go 24 hours without smoking Long - quit smoking altogether and continue healthy eating         Exercise Goals and Review: Exercise Goals    Row Name 08/09/17 1340             Exercise Goals   Increase Physical Activity  Yes       Intervention  Provide advice, education, support and counseling about physical activity/exercise needs.;Develop an individualized exercise prescription for aerobic and resistive training based on initial evaluation findings, risk stratification, comorbidities and participant's personal goals.       Expected Outcomes  Achievement of increased cardiorespiratory fitness and enhanced flexibility, muscular endurance and strength shown through measurements of functional capacity and personal statement of participant.       Increase Strength and Stamina  Yes       Intervention  Provide advice, education, support and counseling about physical activity/exercise needs.;Develop an individualized exercise prescription for aerobic and resistive training based on initial evaluation findings, risk stratification, comorbidities and participant's personal goals.       Expected Outcomes  Achievement of increased cardiorespiratory fitness and enhanced flexibility, muscular endurance and strength shown through measurements of functional capacity and personal statement of participant.       Able to understand and use rate of perceived exertion (RPE) scale  Yes       Intervention  Provide education and explanation on how to use RPE scale       Expected Outcomes  Short Term: Able to use RPE daily in rehab to express subjective intensity level;Long Term:  Able to use RPE to guide intensity level when exercising independently       Able to understand and use Dyspnea scale  Yes       Intervention  Provide education and explanation on how to use Dyspnea scale       Expected Outcomes  Short Term: Able to use Dyspnea scale daily in rehab to express subjective sense of shortness of breath during exertion;Long Term: Able to use Dyspnea scale to guide intensity level when exercising  independently       Knowledge and understanding of Target Heart Rate Range (THRR)  Yes       Intervention  Provide education and explanation of THRR including how the numbers were predicted and where they are located for reference       Expected Outcomes  Short Term: Able to state/look up THRR;Long Term: Able to use THRR to govern intensity when exercising independently;Short Term: Able to use daily as guideline for intensity in rehab       Able to check pulse independently  Yes       Intervention  Provide education and demonstration on how to check pulse in carotid and radial arteries.;Review the importance of being able to check your own pulse for safety during independent exercise       Expected Outcomes  Short Term: Able to explain why pulse checking is  important during independent exercise;Long Term: Able to check pulse independently and accurately       Understanding of Exercise Prescription  Yes       Intervention  Provide education, explanation, and written materials on patient's individual exercise prescription       Expected Outcomes  Short Term: Able to explain program exercise prescription;Long Term: Able to explain home exercise prescription to exercise independently          Nutrition & Weight - Outcomes: Pre Biometrics - 08/09/17 1340      Pre Biometrics   Height  6' (1.829 m)    Weight  202 lb 4.8 oz (91.8 kg)    Waist Circumference  40 inches    Hip Circumference  42 inches    Waist to Hip Ratio  0.95 %    BMI (Calculated)  27.43    Single Leg Stand  21.42 seconds        Nutrition: Nutrition Therapy & Goals - 09/27/17 1615      Nutrition Therapy   RD appointment deferred  Yes       Nutrition Discharge: Nutrition Assessments - 10/17/17 1806      MEDFICTS Scores   Post Score  37       Education Questionnaire Score: Knowledge Questionnaire Score - 10/17/17 1805      Knowledge Questionnaire Score   Post Score  27/28       Goals reviewed with patient;  copy given to patient.

## 2017-10-17 NOTE — Progress Notes (Signed)
Daily Session Note  Patient Details  Name: Lawrence Holmes MRN: [id number] Date of Birth: 1956/04/28 Referring Provider:     Cardiac Rehab from 08/09/2017 in [facility]  Referring Provider  Arida      Encounter Date: 10/17/2017  Check In: Session Check In - 10/17/17 1710      Check-In   Staff Present  Renita Papa, RN Vickki Hearing, BA, ACSM CEP, Exercise Physiologist;Carroll Enterkin, RN, BSN    Supervising physician immediately available to respond to emergencies  See telemetry face sheet for immediately available ER MD    Medication changes reported      No    Fall or balance concerns reported     No    Warm-up and Cool-down  Performed on first and last piece of equipment    Resistance Training Performed  Yes    VAD Patient?  No      Pain Assessment   Currently in Pain?  No/denies          Social History   Tobacco Use  Smoking Status Current Every Day Smoker  . Packs/day: 0.50  . Types: Cigarettes  Smokeless Tobacco Never Used  Tobacco Comment   09/06/17 Tobacco cessation reviewed. Currently smoking about 10 cigs a day.    Goals Met:  Independence with exercise equipment Exercise tolerated well No report of cardiac concerns or symptoms Strength training completed today  Goals Unmet:  Not Applicable  Comments:  Lawrence Holmes graduated today from  rehab with 36 sessions completed.  Details of the patient's exercise prescription and what He needs to do in order to continue the prescription and progress were discussed with patient.  Patient was given a copy of prescription and goals.  Patient verbalized understanding.  Lawrence Holmes plans to continue to exercise by attending a local gym .    Dr. Emily Filbert is Medical Director for [place] [facility].

## 2017-10-19 ENCOUNTER — Other Ambulatory Visit: Payer: Self-pay

## 2017-10-19 MED ORDER — CLOPIDOGREL BISULFATE 75 MG PO TABS: 75.0000 mg | ORAL_TABLET | Freq: Every day | ORAL | 2 refills | Status: DC

## 2017-10-19 MED ORDER — ATORVASTATIN CALCIUM 80 MG PO TABS: 80.0000 mg | ORAL_TABLET | Freq: Every day | ORAL | 2 refills | Status: DC

## 2017-10-19 NOTE — Telephone Encounter (Signed)
*  STAT* If patient is at the pharmacy, call can be transferred to refill team.   1. Which medications need to be refilled? (please list name of each medication and dose if known) Lipior and Plavix, Metformin  2. Which pharmacy/location (including street and city if local pharmacy) is medication to be sent to?[facility]  3. Do they need a 30 day or 90 day supply? [place]

## 2017-10-19 NOTE — Telephone Encounter (Signed)
Please advise if ok to refill. Dr. Fletcher Anon is not original prescriber.

## 2017-10-23 ENCOUNTER — Other Ambulatory Visit: Payer: Self-pay

## 2017-10-23 ENCOUNTER — Ambulatory Visit (INDEPENDENT_AMBULATORY_CARE_PROVIDER_SITE_OTHER): Payer: 59

## 2017-10-23 DIAGNOSIS — I5022 Chronic systolic (congestive) heart failure: Secondary | ICD-10-CM

## 2017-10-23 DIAGNOSIS — I255 Ischemic cardiomyopathy: Secondary | ICD-10-CM

## 2017-11-01 ENCOUNTER — Ambulatory Visit (INDEPENDENT_AMBULATORY_CARE_PROVIDER_SITE_OTHER): Payer: 59 | Admitting: Cardiovascular Disease

## 2017-11-01 ENCOUNTER — Encounter: Payer: Self-pay | Admitting: Cardiovascular Disease

## 2017-11-01 VITALS — BP 121/78 | HR 78 | Ht 72.0 in | Wt 210.8 lb

## 2017-11-01 DIAGNOSIS — I2510 Atherosclerotic heart disease of native coronary artery without angina pectoris: Secondary | ICD-10-CM | POA: Diagnosis not present

## 2017-11-01 DIAGNOSIS — R0602 Shortness of breath: Secondary | ICD-10-CM

## 2017-11-01 DIAGNOSIS — E785 Hyperlipidemia, unspecified: Secondary | ICD-10-CM | POA: Diagnosis not present

## 2017-11-01 DIAGNOSIS — I5022 Chronic systolic (congestive) heart failure: Secondary | ICD-10-CM | POA: Diagnosis not present

## 2017-11-01 NOTE — Progress Notes (Signed)
Cardiology Office Note   Date:  11/01/2017   ID:  Lawrence Holmes, DOB 03/28/1956, MRN [id number]  PCP:  Patient, No Pcp Per  Cardiologist:   Kathlyn Sacramento, MD   Chief Complaint  Patient presents with  . Follow-up    f/u echo resutlts; denies chest pain, shortness of breath, dizziness, or swelling or palpitations.      History of Present Illness: Lawrence Holmes is a 62 y.o. male who presents for a follow-up visit regarding coronary artery disease.   He presented in December 2018 with inferior ST elevation myocardial infarction with late presentation. Cardiac catheterization which severe two-vessel coronary artery disease with total occlusion of the proximal right coronary artery with left-to-right collaterals and occluded distal left circumflex with collaterals.  No obstructive disease affecting the LAD.  The culprit was felt to be the right coronary artery.  Ejection fraction was 25-30% with inferior wall akinesis.  I probed the right coronary artery with a wire and was not able to cross easily and thus no further intervention was performed.  Echocardiogram showed an EF of 30-35% with mild to moderate mitral regurgitation.  The patient was treated medically. He has been attending cardiac rehab.  During last visit, I switched him to [facility].  Eplerenone was added subsequently. He underwent an echocardiogram earlier this month which showed improvement in EF to 40-45% with akinesis of inferior and inferoseptal myocardium. He works as a Forensic scientist.  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 He has been doing extremely well with no chest pain, shortness of breath or palpitations.    Past Medical History:  Diagnosis Date  . Chronic systolic heart failure ([place]) 07/2017   a. 07/2017 Echo: EF 30-35%, Gr1 DD, inflat, and inf AK, mild to mod MR, mildly dil LA/RA.  [name] Coronary artery disease 07/2017   a. 07/2017 Late presenting  inferior STEMI/Cath: LM nl, LAD min irregs, LCX 100d (L->L collats), RCA 100p (L->R collats), EF 25-35%-->Med Rx.  . Diabetes mellitus without complication ([place])   . Hyperlipidemia   . Ischemic cardiomyopathy    a. 07/2017 Echo: EF 30-35%.  . Tobacco use     Past Surgical History:  Procedure Laterality Date  . EYE SURGERY    . LEFT HEART CATH AND CORONARY ANGIOGRAPHY N/A 07/21/2017   Procedure: LEFT HEART CATH AND CORONARY ANGIOGRAPHY;  Surgeon: Wellington Hampshire, MD;  Location: [facility];  Service: Cardiovascular;  Laterality: N/A;     Current Outpatient Medications  Medication Sig Dispense Refill  . aspirin 81 MG chewable tablet Chew 1 tablet (81 mg total) by mouth daily. 30 tablet 2  . atorvastatin (LIPITOR) 80 MG tablet Take 1 tablet (80 mg total) by mouth daily at 6 PM. 30 tablet 2  . carvedilol (COREG) 3.125 MG tablet Take 1 tablet (3.125 mg total) by mouth 2 (two) times daily with a meal. 60 tablet 1  . clopidogrel (PLAVIX) 75 MG tablet Take 1 tablet (75 mg total) by mouth daily with breakfast. 30 tablet 2  . eplerenone (INSPRA) 25 MG tablet Take 1 tablet (25 mg total) by mouth daily. 30 tablet 6  . metFORMIN (GLUCOPHAGE) 1000 MG tablet Take 1 tablet (1,000 mg total) by mouth 2 (two) times daily with a meal. 60 tablet 1  . nitroGLYCERIN (NITROSTAT) 0.4 MG SL tablet Place 1 tablet (0.4 mg total) under the tongue every 5 (five) minutes  as needed for chest pain. 30 tablet 2  . sacubitril-valsartan (ENTRESTO) 24-26 MG Take 1 tablet by mouth 2 (two) times daily. 180 tablet 3   No current facility-administered medications for this visit.     Allergies:   Patient has no known allergies.    Social History:  The patient  reports that he has been smoking cigarettes.  He has been smoking about 0.50 packs per day. he has never used smokeless tobacco. He reports that he drinks alcohol. He reports that he does not use drugs.   Family History:  The patient's family history  includes Heart disease in his mother; Hyperlipidemia in his mother; Hypertension in his mother.    ROS:  Please see the history of present illness.   Otherwise, review of systems are positive for none.   All other systems are reviewed and negative.    PHYSICAL EXAM: VS:  BP 121/78 (BP Location: Left Arm, Patient Position: Sitting, Cuff Size: Large)   Pulse 78   Ht 6' (1.829 m)   Wt 210 lb 12 oz (95.6 kg)   SpO2 98%   BMI 28.58 kg/m  , BMI Body mass index is 28.58 kg/m. GEN: Well nourished, well developed, in no acute distress  HEENT: normal  Neck: no JVD, carotid bruits, or masses Cardiac: RRR; no murmurs, rubs, or gallops,no edema  Respiratory:  clear to auscultation bilaterally, normal work of breathing GI: soft, nontender, nondistended, + BS MS: no deformity or atrophy  Skin: warm and dry, no rash Neuro:  Strength and sensation are intact Psych: euthymic mood, full affect   EKG:  EKG is not ordered today.   Recent Labs: 07/21/2017: Magnesium 2.0 07/23/2017: Hemoglobin 15.0; Platelets 282 09/12/2017: ALT 24; BUN 18; Creatinine, Ser 1.05; Potassium 3.6; Sodium 140    Lipid Panel    Component Value Date/Time   CHOL 88 09/12/2017 1559   TRIG 63 09/12/2017 1559   HDL 24 (L) 09/12/2017 1559   CHOLHDL 3.7 09/12/2017 1559   VLDL 13 09/12/2017 1559   LDLCALC 51 09/12/2017 1559      Wt Readings from Last 3 Encounters:  11/01/17 210 lb 12 oz (95.6 kg)  09/04/17 206 lb 4 oz (93.6 kg)  08/09/17 202 lb 4.8 oz (91.8 kg)      No flowsheet data found.    ASSESSMENT AND PLAN:  1.  Coronary artery disease involving native coronary arteries without angina: He is doing well overall with no anginal symptoms.   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    2.  Chronic systolic heart failure due to ischemic cardiomyopathy: He appears to be euvolemic.  Most recent ejection fraction improved to 40-45% on  medical therapy.  3.  Tobacco use: He cut down but did not quit completely.  I strongly advised him to quit.  4.  Hyperlipidemia: Continue high-dose atorvastatin.  Lipid profile showed significant improvement in LDL down to 51.  5.  Diabetes mellitus: He is going to establish with Dr. Sanda Klein in the near future.   Disposition:   FU with me in 6 months  Signed,  Kathlyn Sacramento, MD  11/01/2017 4:46 PM    [place]

## 2017-11-01 NOTE — Patient Instructions (Signed)
Medication Instructions:  Your physician recommends that you continue on your current medications as directed. Please refer to the Current Medication list given to you today.   Labwork: none  Testing/Procedures: Your physician has requested that you have an exercise tolerance test. For further information please visit [email or web address]. Please also follow instruction sheet, as given.  - DO NOT TAKE Coreg the night before or morning of the stress test.   DO NOT drink or eat foods with caffeine for 24 hours before the test. (Chocolate, coffee, tea, decaf coffee/tea, or energy drinks)  DO NOT smoke for 4 hours before your test.  If you use an inhaler, bring it with you to the test.  Wear comfortable shoes and clothing. Women do not wear dresses.   Follow-Up: Your physician wants you to follow-up in: [place]. You will receive a reminder letter in the mail two months in advance. If you don't receive a letter, please call our office to schedule the follow-up appointment.  If you need a refill on your cardiac medications before your next appointment, please call your pharmacy.    Exercise Stress Electrocardiogram An exercise stress electrocardiogram is a test to check how blood flows to your heart. It is done to find areas of poor blood flow. You will need to walk on a treadmill for this test. The electrocardiogram will record your heartbeat when you are at rest and when you are exercising. What happens before the procedure?  Do not have drinks with caffeine or foods with caffeine for 24 hours before the test, or as told by your doctor. This includes coffee, tea (even decaf tea), sodas, chocolate, and cocoa.  Follow your doctor's instructions about eating and drinking before the test.  Ask your doctor what medicines you should or should not take before the test. Take your medicines with water unless told by your doctor not to.  If you use an inhaler, bring it with you  to the test.  Bring a snack to eat after the test.  Do not  smoke for 4 hours before the test.  Do not put lotions, powders, creams, or oils on your chest before the test.  Wear comfortable shoes and clothing. What happens during the procedure?  You will have patches put on your chest. Small areas of your chest may need to be shaved. Wires will be connected to the patches.  Your heart rate will be watched while you are resting and while you are exercising.  You will walk on the treadmill. The treadmill will slowly get faster to raise your heart rate.  The test will take about 1-2 hours. What happens after the procedure?  Your heart rate and blood pressure will be watched after the test.  You may return to your normal diet, activities, and medicines or as told by your doctor. This information is not intended to replace advice given to you by your health care provider. Make sure you discuss any questions you have with your health care provider. Document Released: 01/24/2008 Document Revised: 04/05/2016 Document Reviewed: 04/14/2013 Elsevier Interactive Patient Education  [employer].

## 2017-11-02 ENCOUNTER — Telehealth: Payer: Self-pay | Admitting: Cardiovascular Disease

## 2017-11-02 NOTE — Telephone Encounter (Signed)
Patients sister states that she is on his release form which I do not see in his chart. Just confirmed that patient is aware of stress test on Monday with instructions for him to check on release form when he comes in for appointment. She verbalized understanding with no further questions.

## 2017-11-05 ENCOUNTER — Ambulatory Visit (INDEPENDENT_AMBULATORY_CARE_PROVIDER_SITE_OTHER): Payer: 59

## 2017-11-05 ENCOUNTER — Other Ambulatory Visit: Payer: Self-pay

## 2017-11-05 DIAGNOSIS — Z024 Encounter for examination for driving license: Secondary | ICD-10-CM

## 2017-11-05 DIAGNOSIS — R0602 Shortness of breath: Secondary | ICD-10-CM

## 2017-11-05 LAB — EXERCISE TOLERANCE TEST
CHL CUP MPHR: 158 {beats}/min
CHL RATE OF PERCEIVED EXERTION: 13
CSEPED: 7 min
CSEPHR: 89 %
Estimated workload: 9.5 METS
Exercise duration (sec): 38 s
Peak HR: 141 {beats}/min
Rest HR: 71 {beats}/min

## 2017-11-08 ENCOUNTER — Other Ambulatory Visit: Payer: Self-pay | Admitting: Cardiovascular Disease

## 2017-11-08 ENCOUNTER — Ambulatory Visit (INDEPENDENT_AMBULATORY_CARE_PROVIDER_SITE_OTHER): Payer: 59 | Admitting: Family Medicine

## 2017-11-08 ENCOUNTER — Encounter: Payer: Self-pay | Admitting: Family Medicine

## 2017-11-08 VITALS — BP 124/74 | HR 92 | Temp 98.1°F | Ht 73.5 in | Wt 211.0 lb

## 2017-11-08 DIAGNOSIS — I509 Heart failure, unspecified: Secondary | ICD-10-CM | POA: Insufficient documentation

## 2017-11-08 DIAGNOSIS — B351 Tinea unguium: Secondary | ICD-10-CM

## 2017-11-08 DIAGNOSIS — E1165 Type 2 diabetes mellitus with hyperglycemia: Secondary | ICD-10-CM | POA: Diagnosis not present

## 2017-11-08 DIAGNOSIS — Z1211 Encounter for screening for malignant neoplasm of colon: Secondary | ICD-10-CM | POA: Diagnosis not present

## 2017-11-08 DIAGNOSIS — E1169 Type 2 diabetes mellitus with other specified complication: Secondary | ICD-10-CM | POA: Insufficient documentation

## 2017-11-08 DIAGNOSIS — E119 Type 2 diabetes mellitus without complications: Secondary | ICD-10-CM

## 2017-11-08 DIAGNOSIS — L84 Corns and callosities: Secondary | ICD-10-CM

## 2017-11-08 DIAGNOSIS — I5022 Chronic systolic (congestive) heart failure: Secondary | ICD-10-CM

## 2017-11-08 DIAGNOSIS — Z720 Tobacco use: Secondary | ICD-10-CM

## 2017-11-08 DIAGNOSIS — I252 Old myocardial infarction: Secondary | ICD-10-CM | POA: Insufficient documentation

## 2017-11-08 DIAGNOSIS — I255 Ischemic cardiomyopathy: Secondary | ICD-10-CM | POA: Diagnosis not present

## 2017-11-08 MED ORDER — METFORMIN HCL 1000 MG PO TABS: 1000.0000 mg | ORAL_TABLET | Freq: Two times a day (BID) | ORAL | 2 refills | Status: DC

## 2017-11-08 NOTE — Assessment & Plan Note (Signed)
No symptoms; see ing cardiologist

## 2017-11-08 NOTE — Assessment & Plan Note (Signed)
Opted for Cologuard

## 2017-11-08 NOTE — Assessment & Plan Note (Signed)
Asymptomatic; seeing cardiologist; weigh daily, and call cardiologist if gain 2 pounds overnight or 5 pounds in a week

## 2017-11-08 NOTE — Assessment & Plan Note (Signed)
Continue aspirin; check A1c today; refer to podiatrist for calluses and thick nails; at goal BP and LDL

## 2017-11-08 NOTE — Patient Instructions (Addendum)
I do encourage you to quit smoking Call [phone] to sign up for smoking cessation classes You can call 1-800-QUIT-NOW to talk with a smoking cessation coach We'll get labs today If you have not heard anything from my staff in a week about any orders/referrals/studies from today, please contact us here to follow-up [phone]    Steps to Quit Smoking Smoking tobacco can be harmful to your health and can affect almost every organ in your body. Smoking puts you, and those around you, at risk for developing many serious chronic diseases. Quitting smoking is difficult, but it is one of the best things that you can do for your health. It is never too late to quit. What are the benefits of quitting smoking? When you quit smoking, you lower your risk of developing serious diseases and conditions, such as:  Lung cancer or lung disease, such as COPD.  Heart disease.  Stroke.  Heart attack.  Infertility.  Osteoporosis and bone fractures.  Additionally, symptoms such as coughing, wheezing, and shortness of breath may get better when you quit. You may also find that you get sick less often because your body is stronger at fighting off colds and infections. If you are pregnant, quitting smoking can help to reduce your chances of having a baby of low birth weight. How do I get ready to quit? When you decide to quit smoking, create a plan to make sure that you are successful. Before you quit:  Pick a date to quit. Set a date within the next two weeks to give you time to prepare.  Write down the reasons why you are quitting. Keep this list in places where you will see it often, such as on your bathroom mirror or in your car or wallet.  Identify the people, places, things, and activities that make you want to smoke (triggers) and avoid them. Make sure to take these actions: ? Throw away all cigarettes at home, at work, and in your car. ? Throw away smoking accessories, such as Scientist, physiological. ? Clean your car and make sure to empty the ashtray. ? Clean your home, including curtains and carpets.  Tell your family, friends, and coworkers that you are quitting. Support from your loved ones can make quitting easier.  Talk with your health care provider about your options for quitting smoking.  Find out what treatment options are covered by your health insurance.  What strategies can I use to quit smoking? Talk with your healthcare provider about different strategies to quit smoking. Some strategies include:  Quitting smoking altogether instead of gradually lessening how much you smoke over a period of time. Research shows that quitting "cold [place]" is more successful than gradually quitting.  Attending in-person counseling to help you build problem-solving skills. You are more likely to have success in quitting if you attend several counseling sessions. Even short sessions of 10 minutes can be effective.  Finding resources and support systems that can help you to quit smoking and remain smoke-free after you quit. These resources are most helpful when you use them often. They can include: ? Online chats with a Social worker. ? Telephone quitlines. ? Careers information officer. ? Support groups or group counseling. ? Text messaging programs. ? Mobile phone applications.  Taking medicines to help you quit smoking. (If you are pregnant or breastfeeding, talk with your health care provider first.) Some medicines contain nicotine and some do not. Both types of medicines help with cravings, but the medicines  that include nicotine help to relieve withdrawal symptoms. Your health care provider may recommend: ? Nicotine patches, gum, or lozenges. ? Nicotine inhalers or sprays. ? Non-nicotine medicine that is taken by mouth.  Talk with your health care provider about combining strategies, such as taking medicines while you are also receiving in-person counseling. Using these two  strategies together makes you more likely to succeed in quitting than if you used either strategy on its own. If you are pregnant or breastfeeding, talk with your health care provider about finding counseling or other support strategies to quit smoking. Do not take medicine to help you quit smoking unless told to do so by your health care provider. What things can I do to make it easier to quit? Quitting smoking might feel overwhelming at first, but there is a lot that you can do to make it easier. Take these important actions:  Reach out to your family and friends and ask that they support and encourage you during this time. Call telephone quitlines, reach out to support groups, or work with a counselor for support.  Ask people who smoke to avoid smoking around you.  Avoid places that trigger you to smoke, such as bars, parties, or smoke-break areas at work.  Spend time around people who do not smoke.  Lessen stress in your life, because stress can be a smoking trigger for some people. To lessen stress, try: ? Exercising regularly. ? Deep-breathing exercises. ? Yoga. ? Meditating. ? Performing a body scan. This involves closing your eyes, scanning your body from head to toe, and noticing which parts of your body are particularly tense. Purposefully relax the muscles in those areas.  Download or purchase mobile phone or tablet apps (applications) that can help you stick to your quit plan by providing reminders, tips, and encouragement. There are many free apps, such as QuitGuide from the [employer] Office manager for Disease Control and Prevention). You can find other support for quitting smoking (smoking cessation) through smokefree.gov and other websites.  How will I feel when I quit smoking? Within the first 24 hours of quitting smoking, you may start to feel some withdrawal symptoms. These symptoms are usually most noticeable 2-3 days after quitting, but they usually do not last beyond 2-3 weeks.  Changes or symptoms that you might experience include:  Mood swings.  Restlessness, anxiety, or irritation.  Difficulty concentrating.  Dizziness.  Strong cravings for sugary foods in addition to nicotine.  Mild weight gain.  Constipation.  Nausea.  Coughing or a sore throat.  Changes in how your medicines work in your body.  A depressed mood.  Difficulty sleeping (insomnia).  After the first 2-3 weeks of quitting, you may start to notice more positive results, such as:  Improved sense of smell and taste.  Decreased coughing and sore throat.  Slower heart rate.  Lower blood pressure.  Clearer skin.  The ability to breathe more easily.  Fewer sick days.  Quitting smoking is very challenging for most people. Do not get discouraged if you are not successful the first time. Some people need to make many attempts to quit before they achieve long-term success. Do your best to stick to your quit plan, and talk with your health care provider if you have any questions or concerns. This information is not intended to replace advice given to you by your health care provider. Make sure you discuss any questions you have with your health care provider. Document Released: 08/01/2001 Document Revised: 04/04/2016 Document Reviewed:  12/22/2014 Elsevier Interactive Patient Education  [employer].

## 2017-11-08 NOTE — Assessment & Plan Note (Signed)
Encouragement given; 3 min on counseling; see AVS

## 2017-11-08 NOTE — Progress Notes (Signed)
BP 124/74 (BP Location: Right Arm, Patient Position: Sitting, Cuff Size: Large)   Pulse 92   Temp 98.1 F (36.7 C) (Oral)   Ht 6' 1.5" (1.867 m)   Wt 211 lb (95.7 kg)   SpO2 98%   BMI 27.46 kg/m    Subjective:    Patient ID: Lawrence Holmes, male    DOB: 1955/12/28, 62 y.o.   MRN: [id number]  HPI: Lawrence Holmes is a 62 y.o. male  Chief Complaint  Patient presents with  . 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 Probably due for one; had cataracts removed callouses on the feet; half a cup of tea Last A1c 11.4 in December 2018 No blurred vision, no dry mouth  Depression screen [facility] 2/9 11/08/2017 10/17/2017 08/09/2017  Decreased Interest 0 0 0  Down, Depressed, Hopeless 0 0 0  PHQ - 2 Score 0 0 0  Altered sleeping - 0 0  Tired, decreased energy - 0 0  Change in appetite - 0 0  Feeling bad or failure about yourself  - 0 0  Trouble concentrating - 0 0  Moving slowly or fidgety/restless - 0 0  Suicidal thoughts - 0 0  PHQ-9 Score - 0 0    Relevant past medical, surgical, family and social history reviewed Past Medical History:  Diagnosis Date  . Chronic systolic heart failure ([place]) 07/2017   a. 07/2017 Echo: EF 30-35%, Gr1 DD, inflat, and inf AK, mild to mod MR, mildly dil LA/RA.  [name] Coronary artery disease  07/2017   a. 07/2017 Late presenting inferior STEMI/Cath: LM nl, LAD min irregs, LCX 100d (L->L collats), RCA 100p (L->R collats), EF 25-35%-->Med Rx.  . Diabetes mellitus without complication ([place])   . Hyperlipidemia   . Ischemic cardiomyopathy    a. 07/2017 Echo: EF 30-35%.  . Tobacco use    Past Surgical History:  Procedure Laterality Date  . EYE SURGERY    . LEFT HEART CATH AND CORONARY ANGIOGRAPHY N/A 07/21/2017   Procedure: LEFT HEART CATH AND CORONARY ANGIOGRAPHY;  Surgeon: Wellington Hampshire, MD;  Location: [facility];  Service: Cardiovascular;  Laterality: N/A;   Family History  Problem Relation Age of Onset  . Heart disease Mother   . Hyperlipidemia Mother   . Hypertension Mother   . Heart attack Sister    Social History   Tobacco Use  . Smoking status: Current Every Day Smoker    Packs/day: 0.50    Years: 25.00    Pack years: 12.50    Types: Cigarettes  . Smokeless tobacco: Never Used  . Tobacco comment: 09/06/17 Tobacco cessation reviewed. Currently smoking about 10 cigs a day.  Substance Use Topics  . Alcohol use: Yes  . Drug use: No    Interim medical history since  last visit reviewed. Allergies and medications reviewed  Review of Systems Per HPI unless specifically indicated above     Objective:    BP 124/74 (BP Location: Right Arm, Patient Position: Sitting, Cuff Size: Large)   Pulse 92   Temp 98.1 F (36.7 C) (Oral)   Ht 6' 1.5" (1.867 m)   Wt 211 lb (95.7 kg)   SpO2 98%   BMI 27.46 kg/m   Wt Readings from Last 3 Encounters:  11/08/17 211 lb (95.7 kg)  11/01/17 210 lb 12 oz (95.6 kg)  09/04/17 206 lb 4 oz (93.6 kg)    Physical Exam  Constitutional: He appears well-developed and well-nourished. No distress.  HENT:  Head: Normocephalic and atraumatic.  Eyes: EOM are normal. No scleral icterus.  Neck: No thyromegaly present.  Cardiovascular: Normal rate and regular rhythm.  Pulmonary/Chest: Effort normal and breath sounds normal.    Abdominal: Soft. Bowel sounds are normal. He exhibits no distension.  Musculoskeletal: He exhibits no edema.  Neurological: Coordination normal.  Skin: Skin is warm and dry. No pallor.  Psychiatric: He has a normal mood and affect. His behavior is normal. Judgment and thought content normal.    Results for orders placed or performed in visit on 11/05/17  Exercise Tolerance Test  Result Value Ref Range   Rest HR 71 bpm   Rest BP 128/73 mmHg   RPE 13    Exercise duration (sec) 38 sec   Percent HR 89 %   Exercise duration (min) 7 min   Estimated workload 9.5 METS   Peak HR 141 bpm   Peak BP 214/101 mmHg   MPHR 158 bpm      Assessment & Plan:   Problem List Items Addressed This Visit      Cardiovascular and Mediastinum   Ischemic cardiomyopathy (Chronic)    Seeing cardiologist      CHF (congestive heart failure) (HCC) (Chronic)    Asymptomatic; seeing cardiologist; weigh daily, and call cardiologist if gain 2 pounds overnight or 5 pounds in a week        Endocrine   Type II diabetes mellitus, uncontrolled ([place]) - Primary (Chronic)    Continue aspirin; check A1c today; refer to podiatrist for calluses and thick nails; at goal BP and LDL      Relevant Medications   metFORMIN (GLUCOPHAGE) 1000 MG tablet   Other Relevant Orders   Ambulatory referral to Podiatry   Hemoglobin A1c     Musculoskeletal and Integument   Onychomycosis    Refer to foot doctor      Relevant Orders   Ambulatory referral to [employer] callus    Refer to podiatrist      Relevant Orders   Ambulatory referral to Podiatry     Other   Tobacco use    Encouragement given; 3 min on counseling; see AVS      Screening for colon cancer    Opted for Cologuard      Relevant Orders   Cologuard   History of myocardial infarction (Chronic)    No symptoms; see ing cardiologist       Other Visit Diagnoses    Encounter for screening colonoscopy       Relevant Orders   Ambulatory  referral to Gastroenterology       Follow up plan: Return in about 3 months (around 02/08/2018) for follow-up visit with Dr. Sanda Klein.  An after-visit summary was printed and given to the patient at [facility].  Please  see the patient instructions which may contain other information and recommendations beyond what is mentioned above in the assessment and plan.  Meds ordered this encounter  Medications  . metFORMIN (GLUCOPHAGE) 1000 MG tablet    Sig: Take 1 tablet (1,000 mg total) by mouth 2 (two) times daily with a meal.    Dispense:  60 tablet    Refill:  2    Orders Placed This Encounter  Procedures  . Urine Microalbumin w/creat. ratio  . Hemoglobin A1c  . Cologuard  . Ambulatory referral to Gastroenterology  . Ambulatory referral to Podiatry

## 2017-11-08 NOTE — Assessment & Plan Note (Signed)
Refer to foot doctor. 

## 2017-11-08 NOTE — Assessment & Plan Note (Signed)
Refer to podiatrist 

## 2017-11-08 NOTE — Assessment & Plan Note (Signed)
Seeing cardiologist 

## 2017-11-09 ENCOUNTER — Telehealth: Payer: Self-pay

## 2017-11-09 LAB — MICROALBUMIN / CREATININE URINE RATIO
Creatinine, Urine: 172 mg/dL (ref 20–320)
Microalb Creat Ratio: 2 mcg/mg creat (ref <30)
Microalb, Ur: 0.4 mg/dL

## 2017-11-09 LAB — HEMOGLOBIN A1C
EAG (MMOL/L): 8.2 (calc)
Hgb A1c MFr Bld: 6.8 % of total Hgb — ABNORMAL HIGH (ref <5.7)
MEAN PLASMA GLUCOSE: 148 (calc)

## 2017-11-09 NOTE — Telephone Encounter (Signed)
-----   Message from Arnetha Courser, MD sent at 11/09/2017 12:16 PM EDT ----- Wow. Okie, please let the patient know that I am so impressed with his blood sugar average. He has done such a great job with his diabetes. His urine test looked great. He is not spilling any excessive protein through his kidneys. Thank you

## 2017-11-09 NOTE — Telephone Encounter (Signed)
Called pt's mobile number, sister answer, she is listed on DPR informed her of lab results, she states that she will inform pt.

## 2017-11-13 ENCOUNTER — Other Ambulatory Visit: Payer: Self-pay

## 2017-11-13 MED ORDER — CARVEDILOL 3.125 MG PO TABS: 3.1250 mg | ORAL_TABLET | Freq: Two times a day (BID) | ORAL | 6 refills | Status: DC

## 2017-11-16 ENCOUNTER — Ambulatory Visit: Payer: Self-pay

## 2017-11-16 ENCOUNTER — Telehealth: Payer: Self-pay

## 2017-11-16 NOTE — Telephone Encounter (Signed)
Called pt to see what was going on with sugar he states his highest  Has been 211 and today 68.  He has been having a little dizziness and did not no if it was coming from this?  I told him he probably would need to be seen to evaluate?

## 2017-11-16 NOTE — Telephone Encounter (Signed)
Copied from [place] [id number]. Topic: General - Other >> Nov 16, 2017  9:55 AM Carolyn Stare wrote:  Pt has been having issues with his blood sugar and is asking for a call back. He did speak with [facility] but sister is calling and said when pt saw Dr Sanda Klein as a new pt last week she told him she may have to adjust his blood sugar metformin .  Sister said this is the first time this has happen with his sugar going up and down  Pt was not able to work today   [phone]

## 2017-11-16 NOTE — Telephone Encounter (Signed)
Patient called with c/o "low blood sugar."  He says "this morning I woke up and the room was spinning. I sit on the side of the bed for a few minutes until the dizziness went away, then got up and checked my blood sugar and it was 68. I drank 1/2 cup orange juice and now it is 106 and I feel better. Monday I felt dizzy and checked my blood sugar and it was 211. I haven't been sick, no cold symptoms or fever." I asked about meals/snacks, he says "I eat all my meals and eat a snack before bed. Last night I ate some grapes." I asked about his usual morning blood sugar, he says "98-100, but I don't check it every morning." I asked about his diabetes medication, he says "I take metformin and haven't missed doses." I asked about other symptoms, he said "a little diarrhea last night and this morning. Other than that, everything is fine." According to protocol, home care management. Care advice given, I advised the patient to keep a daily record of his blood sugar morning and evening over the weekend. If it is below 70 every morning and/or if he continues to experience dizziness, to call the office back on Monday for an appointment or if he is worse over the weekend to go to the ED to be evaluated, he verbalized understanding.   Reason for Disposition . [1] Blood glucose < 70 mg/dl (3.9 mmol/l) or symptomatic AND [2] cause known  Answer Assessment - Initial Assessment Questions 1. SYMPTOMS: "What symptoms are you concerned about?"     Room spinning 2. ONSET:  "When did the symptoms start?"     Low blood sugar 3. BLOOD GLUCOSE: "What is your blood glucose level?"      68 4. USUAL RANGE: "What is your blood glucose level usually?" (e.g., usual fasting morning value, usual evening value)     Around 98-100 5. TYPE 1 or 2:  "Do you know what type of diabetes you have?"  (e.g., Type 1, Type 2, Gestational; doesn't know)      Type 2 6. INSULIN: "Do you take insulin?"      No 7. DIABETES PILLS: "Do you take any  pills for your diabetes?"     Yes 8. OTHER SYMPTOMS: "Do you have any symptoms?" (e.g., fever, frequent urination, difficulty breathing, vomiting)     No, but a little diarrhea 9. LOW BLOOD GLUCOSE TREATMENT: "What have you done so far to treat the low blood glucose level?"     Drank 1/2 cup orange juice 10. ALONE: "Are you alone right now or is someone with you?"        Sister 15. PREGNANCY: "Is there any chance you are pregnant?" "When was your last menstrual period?"       N/A  Protocols used: DIABETES - LOW BLOOD SUGAR-A-AH

## 2017-11-16 NOTE — Telephone Encounter (Signed)
Please have him decrease his metformin from 1000 mg twice a day to just 500 mg twice a day Continue to monitor sugars Do not drive or operate machinery if light-headed or low sugars Appointment or go to urgent care over weekend if this does not resolve symptoms

## 2017-11-16 NOTE — Telephone Encounter (Signed)
notified

## 2017-11-27 ENCOUNTER — Encounter: Payer: Self-pay | Admitting: *Deleted

## 2018-01-10 ENCOUNTER — Telehealth: Payer: Self-pay | Admitting: Cardiovascular Disease

## 2018-01-10 NOTE — Telephone Encounter (Signed)
Lawrence Holmes calling from [facility] asking If we can call back with Ejection fraction  So patient can sign up Heart Failure program with them   Please call back

## 2018-01-10 NOTE — Telephone Encounter (Signed)
Spoke with Juliann Pulse at [facility]. Gave her patient's EF from 10/2017 and she verbalized understanding.

## 2018-01-14 ENCOUNTER — Other Ambulatory Visit: Payer: Self-pay | Admitting: Cardiovascular Disease

## 2018-01-21 ENCOUNTER — Ambulatory Visit: Payer: Self-pay | Admitting: Family Medicine

## 2018-01-21 ENCOUNTER — Encounter

## 2018-02-08 ENCOUNTER — Encounter: Payer: Self-pay | Admitting: Family Medicine

## 2018-02-08 ENCOUNTER — Ambulatory Visit (INDEPENDENT_AMBULATORY_CARE_PROVIDER_SITE_OTHER): Payer: 59 | Admitting: Family Medicine

## 2018-02-08 DIAGNOSIS — Z1211 Encounter for screening for malignant neoplasm of colon: Secondary | ICD-10-CM

## 2018-02-08 DIAGNOSIS — I5022 Chronic systolic (congestive) heart failure: Secondary | ICD-10-CM | POA: Diagnosis not present

## 2018-02-08 DIAGNOSIS — Z720 Tobacco use: Secondary | ICD-10-CM

## 2018-02-08 DIAGNOSIS — I255 Ischemic cardiomyopathy: Secondary | ICD-10-CM

## 2018-02-08 DIAGNOSIS — E119 Type 2 diabetes mellitus without complications: Secondary | ICD-10-CM

## 2018-02-08 DIAGNOSIS — B351 Tinea unguium: Secondary | ICD-10-CM

## 2018-02-08 DIAGNOSIS — I252 Old myocardial infarction: Secondary | ICD-10-CM | POA: Diagnosis not present

## 2018-02-08 MED ORDER — METFORMIN HCL 500 MG PO TABS: 500.0000 mg | ORAL_TABLET | Freq: Two times a day (BID) | ORAL | 3 refills | Status: DC

## 2018-02-08 NOTE — Assessment & Plan Note (Signed)
Referred to foot specialist; consider vicks vaporub

## 2018-02-08 NOTE — Assessment & Plan Note (Signed)
No chest pain now; aspirin daily

## 2018-02-08 NOTE — Patient Instructions (Addendum)
Please do the Cologuard as soon as possible  [place] appointment on July 12 @12 :30  Here is the # for directions or to reschedule: [phone]  I do encourage you to quit smoking Call [phone] to sign up for smoking cessation classes You can call 1-800-QUIT-NOW to talk with a smoking cessation coach   Steps to Quit Smoking Smoking tobacco can be bad for your health. It can also affect almost every organ in your body. Smoking puts you and people around you at risk for many serious long-lasting (chronic) diseases. Quitting smoking is hard, but it is one of the best things that you can do for your health. It is never too late to quit. What are the benefits of quitting smoking? When you quit smoking, you lower your risk for getting serious diseases and conditions. They can include:  Lung cancer or lung disease.  Heart disease.  Stroke.  Heart attack.  Not being able to have children (infertility).  Weak bones (osteoporosis) and broken bones (fractures).  If you have coughing, wheezing, and shortness of breath, those symptoms may get better when you quit. You may also get sick less often. If you are pregnant, quitting smoking can help to lower your chances of having a baby of low birth weight. What can I do to help me quit smoking? Talk with your doctor about what can help you quit smoking. Some things you can do (strategies) include:  Quitting smoking totally, instead of slowly cutting back how much you smoke over a period of time.  Going to in-person counseling. You are more likely to quit if you go to many counseling sessions.  Using resources and support systems, such as: ? Database administrator with a Social worker. ? Phone quitlines. ? Careers information officer. ? Support groups or group counseling. ? Text messaging programs. ? Mobile phone apps or applications.  Taking medicines. Some of these medicines may have nicotine in them. If you are pregnant or breastfeeding,  do not take any medicines to quit smoking unless your doctor says it is okay. Talk with your doctor about counseling or other things that can help you.  Talk with your doctor about using more than one strategy at the same time, such as taking medicines while you are also going to in-person counseling. This can help make quitting easier. What things can I do to make it easier to quit? Quitting smoking might feel very hard at first, but there is a lot that you can do to make it easier. Take these steps:  Talk to your family and friends. Ask them to support and encourage you.  Call phone quitlines, reach out to support groups, or work with a Social worker.  Ask people who smoke to not smoke around you.  Avoid places that make you want (trigger) to smoke, such as: ? Bars. ? Parties. ? Smoke-break areas at work.  Spend time with people who do not smoke.  Lower the stress in your life. Stress can make you want to smoke. Try these things to help your stress: ? Getting regular exercise. ? Deep-breathing exercises. ? Yoga. ? Meditating. ? Doing a body scan. To do this, close your eyes, focus on one area of your body at a time from head to toe, and notice which parts of your body are tense. Try to relax the muscles in those areas.  Download or buy apps on your mobile phone or tablet that can help you stick to your quit plan. There are many  free apps, such as QuitGuide from the [employer] Office manager for Disease Control and Prevention). You can find more support from smokefree.gov and other websites.  This information is not intended to replace advice given to you by your health care provider. Make sure you discuss any questions you have with your health care provider. Document Released: 06/03/2009 Document Revised: 04/04/2016 Document Reviewed: 12/22/2014 Elsevier Interactive Patient Education  2018 [employer].

## 2018-02-08 NOTE — Assessment & Plan Note (Signed)
He will do the cologuard test

## 2018-02-08 NOTE — Assessment & Plan Note (Signed)
Followed by Dr. Arida 

## 2018-02-08 NOTE — Assessment & Plan Note (Signed)
Much better control; foot exam by MD: foot appt coming up; eye exam UTD; urine micro:abl normal

## 2018-02-08 NOTE — Progress Notes (Signed)
BP 114/72   Pulse 90   Temp 97.6 F (36.4 C) (Oral)   Resp 14   Ht 6\' 2"  (1.88 m)   Wt 207 lb 9.6 oz (94.2 kg)   SpO2 94%   BMI 26.65 kg/m    Subjective:    Patient ID: Lawrence Holmes, male    DOB: February 04, 1956, 62 y.o.   MRN: [id number]  HPI: Lawrence Holmes is a 62 y.o. male  Chief Complaint  Patient presents with  . Follow-up    HPI Here is here for f/u  Type 2 diabetes; would like 500 mg tablets of metformin so he doesn't have to split them Sugars are excellent; none over 100; no dry mouth Eye exam UTD; Sept 1, 2018 Thick nails Urine microalb:Cr was perfect Lab Results  Component Value Date   HGBA1C 6.8 (H) 11/08/2017  Had come down from 11.4 He says the exercising made the difference  CHF, coronary artery disease; seeing Dr. Fletcher Anon; heart is behaving fine; saw cardiologist in March Weighing himself daily; knows to watch for weight gain of two pounds overnight Sending weights to home health Avoiding salt No chest pain Low HDL; last level was 24; he had cut back on smoking, still working on it; some days, he might just smoke 2 cigs a day; driving might be a trigger, but can't smoke in the truck any more so that's helping  He'd rather do the cologuard than the colonoscopy  Tobacco abuse; less than 30 pack years; he is not ready to quit  Depression screen [facility] 2/9 02/08/2018 11/08/2017 10/17/2017 08/09/2017  Decreased Interest 0 0 0 0  Down, Depressed, Hopeless 0 0 0 0  PHQ - 2 Score 0 0 0 0  Altered sleeping - - 0 0  Tired, decreased energy - - 0 0  Change in appetite - - 0 0  Feeling bad or failure about yourself  - - 0 0  Trouble concentrating - - 0 0  Moving slowly or fidgety/restless - - 0 0  Suicidal thoughts - - 0 0  PHQ-9 Score - - 0 0    Relevant past medical, surgical, family and social history reviewed Past Medical History:  Diagnosis Date  . Chronic systolic heart failure ([place]) 07/2017   a. 07/2017 Echo: EF 30-35%, Gr1 DD, inflat, and inf AK, mild  to mod MR, mildly dil LA/RA.  [name] Coronary artery disease 07/2017   a. 07/2017 Late presenting inferior STEMI/Cath: LM nl, LAD min irregs, LCX 100d (L->L collats), RCA 100p (L->R collats), EF 25-35%-->Med Rx.  . Diabetes mellitus without complication ([place])   . Hyperlipidemia   . Ischemic cardiomyopathy    a. 07/2017 Echo: EF 30-35%.  . Tobacco use    Past Surgical History:  Procedure Laterality Date  . EYE SURGERY    . LEFT HEART CATH AND CORONARY ANGIOGRAPHY N/A 07/21/2017   Procedure: LEFT HEART CATH AND CORONARY ANGIOGRAPHY;  Surgeon: Wellington Hampshire, MD;  Location: [facility];  Service: Cardiovascular;  Laterality: N/A;   Family History  Problem Relation Age of Onset  . Heart disease Mother   . Hyperlipidemia Mother   . Hypertension Mother   . Heart attack Sister    Social History   Tobacco Use  . Smoking status: Current Every Day Smoker    Packs/day: 0.50    Years: 25.00    Pack years: 12.50    Types: Cigarettes  . Smokeless tobacco: Never Used  . Tobacco comment:  09/06/17 Tobacco cessation reviewed. Currently smoking about 10 cigs a day.  Substance Use Topics  . Alcohol use: Yes    Comment: rare  . Drug use: No    Interim medical history since last visit reviewed. Allergies and medications reviewed  Review of Systems  Respiratory: Negative for shortness of breath.   Cardiovascular: Negative for chest pain.   Per HPI unless specifically indicated above     Objective:    BP 114/72   Pulse 90   Temp 97.6 F (36.4 C) (Oral)   Resp 14   Ht 6\' 2"  (1.88 m)   Wt 207 lb 9.6 oz (94.2 kg)   SpO2 94%   BMI 26.65 kg/m   Wt Readings from Last 3 Encounters:  02/08/18 207 lb 9.6 oz (94.2 kg)  11/08/17 211 lb (95.7 kg)  11/01/17 210 lb 12 oz (95.6 kg)    Physical Exam  Constitutional: He appears well-developed and well-nourished. No distress.  HENT:  Head: Normocephalic and atraumatic.  Eyes: EOM are normal. No scleral icterus.  Neck: No thyromegaly  present.  Cardiovascular: Normal rate and regular rhythm.  Pulmonary/Chest: Effort normal and breath sounds normal.  Abdominal: Soft. Bowel sounds are normal. He exhibits no distension.  Musculoskeletal: He exhibits no edema.  Neurological: Coordination normal.  Skin: Skin is warm and dry. No pallor.  Psychiatric: He has a normal mood and affect. His behavior is normal. Judgment and thought content normal.   Diabetic Foot Form - Detailed   Diabetic Foot Exam - detailed Diabetic Foot exam was performed with the following findings:  Yes 02/08/2018  2:32 PM  Visual Foot Exam completed.:  Yes  Pulse Foot Exam completed.:  Yes  Right Dorsalis Pedis:  Present Left Dorsalis Pedis:  Present  Sensory Foot Exam Completed.:  Yes Semmes-Weinstein Monofilament Test R Site 1-Great Toe:  Pos L Site 1-Great Toe:  Pos        Results for orders placed or performed in visit on 11/08/17  Hemoglobin A1c  Result Value Ref Range   Hgb A1c MFr Bld 6.8 (H) <5.7 % of total Hgb   Mean Plasma Glucose 148 (calc)   eAG (mmol/L) 8.2 (calc)  Microalbumin / creatinine urine ratio  Result Value Ref Range   Creatinine, Urine 172 20 - 320 mg/dL   Microalb, Ur 0.4 mg/dL   Microalb Creat Ratio 2 <30 mcg/mg creat      Assessment & Plan:   Problem List Items Addressed This Visit      Cardiovascular and Mediastinum   Ischemic cardiomyopathy (Chronic)    Followed by Dr. Fletcher Anon      CHF (congestive heart failure) ([place]) (Chronic)    Euvolemic; he is weighing himself and avoiding salt        Endocrine   Type 2 diabetes mellitus, controlled ([place])    Much better control; foot exam by MD: foot appt coming up; eye exam UTD; urine micro:abl normal      Relevant Medications   metFORMIN (GLUCOPHAGE) 500 MG tablet     Musculoskeletal and Integument   Onychomycosis    Referred to foot specialist; consider vicks vaporub        Other   Tobacco use    Discussed Chantix; consider cold [place]; see AVS       Screening for colon cancer    He will do the cologuard test      History of myocardial infarction (Chronic)    No chest pain now; aspirin daily  Follow up plan: Return in about 3 months (around 05/12/2018) for twenty minute follow-up with fasting labs (sept 22nd or just after).  An after-visit summary was printed and given to the patient at [place].  Please see the patient instructions which may contain other information and recommendations beyond what is mentioned above in the assessment and plan.  Meds ordered this encounter  Medications  . metFORMIN (GLUCOPHAGE) 500 MG tablet    Sig: Take 1 tablet (500 mg total) by mouth 2 (two) times daily with a meal.    Dispense:  180 tablet    Refill:  3    Use this instead of the 1000 mg strength    No orders of the defined types were placed in this encounter.

## 2018-02-08 NOTE — Assessment & Plan Note (Signed)
Discussed Chantix; consider cold [place]; see AVS

## 2018-02-08 NOTE — Assessment & Plan Note (Signed)
Euvolemic; he is weighing himself and avoiding salt

## 2018-02-13 ENCOUNTER — Encounter: Payer: Self-pay | Admitting: Family Medicine

## 2018-02-20 ENCOUNTER — Encounter: Payer: Self-pay | Admitting: Family Medicine

## 2018-03-01 ENCOUNTER — Ambulatory Visit: Payer: Self-pay | Admitting: Podiatry

## 2018-03-11 ENCOUNTER — Ambulatory Visit: Payer: Self-pay | Admitting: Podiatry

## 2018-03-27 ENCOUNTER — Other Ambulatory Visit: Payer: Self-pay | Admitting: *Deleted

## 2018-03-27 MED ORDER — EPLERENONE 25 MG PO TABS: 25.0000 mg | ORAL_TABLET | Freq: Every day | ORAL | 3 refills | Status: DC

## 2018-03-27 NOTE — Telephone Encounter (Signed)
Requested Prescriptions   Signed Prescriptions Disp Refills  . eplerenone (INSPRA) 25 MG tablet 30 tablet 3    Sig: Take 1 tablet (25 mg total) by mouth daily.    Authorizing Provider: Kathlyn [place] A    Ordering User: Britt Bottom

## 2018-04-08 ENCOUNTER — Encounter: Payer: Self-pay | Admitting: Podiatry

## 2018-04-08 ENCOUNTER — Ambulatory Visit (INDEPENDENT_AMBULATORY_CARE_PROVIDER_SITE_OTHER): Payer: 59 | Admitting: Podiatry

## 2018-04-08 VITALS — BP 152/84 | HR 73

## 2018-04-08 DIAGNOSIS — E119 Type 2 diabetes mellitus without complications: Secondary | ICD-10-CM | POA: Diagnosis not present

## 2018-04-08 DIAGNOSIS — Q828 Other specified congenital malformations of skin: Secondary | ICD-10-CM

## 2018-04-08 DIAGNOSIS — B351 Tinea unguium: Secondary | ICD-10-CM

## 2018-04-08 DIAGNOSIS — M79674 Pain in right toe(s): Secondary | ICD-10-CM | POA: Diagnosis not present

## 2018-04-08 DIAGNOSIS — M79675 Pain in left toe(s): Secondary | ICD-10-CM

## 2018-04-08 NOTE — Progress Notes (Signed)
This patient presents to the office with chief complaint of long thick nails and diabetic feet.  This patient  says there  is  no pain and discomfort in his  feet.  This patient says there are long thick painful nails.  These nails are painful walking and wearing shoes. Patient also has painful callus under the outside ball of left foot.  Patient has no history of infection or drainage from both feet.  Patient is unable to  self treat his own nails . This patient presents  to the office today for treatment of the  long nails and a foot evaluation due to history of  diabetes.  General Appearance  Alert, conversant and in no acute stress.  Vascular  Dorsalis pedis and posterior tibial  pulses are palpable  bilaterally.  Capillary return is within normal limits  bilaterally. Temperature is within normal limits  bilaterally.  Neurologic  Senn-Weinstein monofilament wire test within normal limits  bilaterally. Muscle power within normal limits bilaterally.  Nails Thick disfigured discolored nails with subungual debris  from hallux to fifth toes bilaterally. No evidence of bacterial infection or drainage bilaterally.  Orthopedic  No limitations of motion of motion feet .  No crepitus or effusions noted.  No bony pathology or digital deformities noted.No inversion/eversion due to heel fracture and ankle fracture left foot. Plantar flexed fifth metatarsal left foot.  Skin  normotropic skin with no porokeratosis noted bilaterally.  No signs of infections or ulcers noted.   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  Onychomycosis.  Porokeratosis sub 5th met left foot.  Diabetes with no foot complications  IE  Debride nails x 10. Debridement of porokeratosis.  A diabetic foot exam was performed and there is no evidence of any vascular or neurologic pathology.   RTC 3 months.   Gardiner Barefoot DPM

## 2018-05-13 ENCOUNTER — Ambulatory Visit (INDEPENDENT_AMBULATORY_CARE_PROVIDER_SITE_OTHER): Payer: 59 | Admitting: Family Medicine

## 2018-05-13 ENCOUNTER — Encounter: Payer: Self-pay | Admitting: Family Medicine

## 2018-05-13 VITALS — BP 136/72 | HR 89 | Temp 97.9°F | Ht 74.0 in | Wt 211.6 lb

## 2018-05-13 DIAGNOSIS — E786 Lipoprotein deficiency: Secondary | ICD-10-CM

## 2018-05-13 DIAGNOSIS — E1169 Type 2 diabetes mellitus with other specified complication: Secondary | ICD-10-CM

## 2018-05-13 DIAGNOSIS — Z114 Encounter for screening for human immunodeficiency virus [HIV]: Secondary | ICD-10-CM | POA: Diagnosis not present

## 2018-05-13 DIAGNOSIS — Z1211 Encounter for screening for malignant neoplasm of colon: Secondary | ICD-10-CM

## 2018-05-13 DIAGNOSIS — Z5181 Encounter for therapeutic drug level monitoring: Secondary | ICD-10-CM

## 2018-05-13 DIAGNOSIS — Z1159 Encounter for screening for other viral diseases: Secondary | ICD-10-CM | POA: Diagnosis not present

## 2018-05-13 DIAGNOSIS — E119 Type 2 diabetes mellitus without complications: Secondary | ICD-10-CM | POA: Diagnosis not present

## 2018-05-13 DIAGNOSIS — Z720 Tobacco use: Secondary | ICD-10-CM

## 2018-05-13 DIAGNOSIS — Z2821 Immunization not carried out because of patient refusal: Secondary | ICD-10-CM

## 2018-05-13 DIAGNOSIS — I255 Ischemic cardiomyopathy: Secondary | ICD-10-CM

## 2018-05-13 DIAGNOSIS — I5022 Chronic systolic (congestive) heart failure: Secondary | ICD-10-CM

## 2018-05-13 MED ORDER — BLOOD GLUC METER DISP-STRIPS DEVI: 1 refills | Status: AC

## 2018-05-13 NOTE — Progress Notes (Signed)
BP 136/72   Pulse 89   Temp 97.9 F (36.6 C) (Oral)   Ht 6\' 2"  (1.88 m)   Wt 211 lb 9.6 oz (96 kg)   SpO2 95%   BMI 27.17 kg/m    Subjective:    Patient ID: Lawrence Holmes, male    DOB: 16-Dec-1955, 62 y.o.   MRN: [id number]  HPI: Lawrence Holmes is a 62 y.o. male  Chief Complaint  Patient presents with  . Follow-up    HPI Patient is here for f/u  Type 2 diabetes mellitus; much improved A1c, down from 11.4 to 6.8 Last urine microalb:Cr was normal in March 2019 Lab Results  Component Value Date   HGBA1C 6.8 (H) 11/08/2017    High cholesterol, low HDL; on 80 mg atorvastatin; smoking; still about 1 ppd, depends on the day; interested in quitting; thinks about it all the time; never tried chantix;  Lab Results  Component Value Date   CHOL 88 09/12/2017   HDL 24 (L) 09/12/2017   LDLCALC 51 09/12/2017   TRIG 63 09/12/2017   CHOLHDL 3.7 09/12/2017   CAD; seeing cardiologist; no chest pain; feels like a $100 bill  CHF; seeing cardiologist; last echo 30-35%, 2018; able to sleep flat in the bed  HTN; controlled today with medicines; tries to limit salt intake, no salt to the food  HM list: declined tetanus booster; okay for HIV and hep C per regular screening protocol Does not want flu shot or pneumonia shot cologuard at home but hard to coordinate doing it and getting it back to the company Depression screen [facility] 2/9 05/13/2018 05/13/2018 02/08/2018 11/08/2017 10/17/2017  Decreased Interest 0 0 0 0 0  Down, Depressed, Hopeless 0 0 0 0 0  PHQ - 2 Score 0 0 0 0 0  Altered sleeping 0 - - - 0  Tired, decreased energy 0 - - - 0  Change in appetite 0 - - - 0  Feeling bad or failure about yourself  0 - - - 0  Trouble concentrating 0 - - - 0  Moving slowly or fidgety/restless 0 - - - 0  Suicidal thoughts 0 - - - 0  PHQ-9 Score 0 - - - 0  Difficult doing work/chores Not difficult at all - - - -    Relevant past medical, surgical, family and social history reviewed Past Medical  History:  Diagnosis Date  . Chronic systolic heart failure ([place]) 07/2017   a. 07/2017 Echo: EF 30-35%, Gr1 DD, inflat, and inf AK, mild to mod MR, mildly dil LA/RA.  [name] Coronary artery disease 07/2017   a. 07/2017 Late presenting inferior STEMI/Cath: LM nl, LAD min irregs, LCX 100d (L->L collats), RCA 100p (L->R collats), EF 25-35%-->Med Rx.  . Diabetes mellitus without complication ([place])   . Hyperlipidemia   . Ischemic cardiomyopathy    a. 07/2017 Echo: EF 30-35%.  . Tobacco use    Past Surgical History:  Procedure Laterality Date  . EYE SURGERY    . LEFT HEART CATH AND CORONARY ANGIOGRAPHY N/A 07/21/2017   Procedure: LEFT HEART CATH AND CORONARY ANGIOGRAPHY;  Surgeon: Wellington Hampshire, MD;  Location: [facility];  Service: Cardiovascular;  Laterality: N/A;   Family History  Problem Relation Age of Onset  . Heart disease Mother   . Hyperlipidemia Mother   . Hypertension Mother   . Heart attack Sister    Social History   Tobacco Use  . Smoking status: Current Every  Day Smoker    Packs/day: 0.50    Years: 25.00    Pack years: 12.50    Types: Cigarettes  . Smokeless tobacco: Never Used  . Tobacco comment: 09/06/17 Tobacco cessation reviewed. Currently smoking about 10 cigs a day.  Substance Use Topics  . Alcohol use: Yes    Comment: rare  . Drug use: No    Interim medical history since last visit reviewed. Allergies and medications reviewed  Review of Systems  Constitutional: Negative for unexpected weight change.  Cardiovascular: Negative for chest pain and leg swelling.   Per HPI unless specifically indicated above     Objective:    BP 136/72   Pulse 89   Temp 97.9 F (36.6 C) (Oral)   Ht 6\' 2"  (1.88 m)   Wt 211 lb 9.6 oz (96 kg)   SpO2 95%   BMI 27.17 kg/m   Wt Readings from Last 3 Encounters:  05/13/18 211 lb 9.6 oz (96 kg)  02/08/18 207 lb 9.6 oz (94.2 kg)  11/08/17 211 lb (95.7 kg)    Physical Exam  Constitutional: He appears  well-developed and well-nourished. No distress.  overweight  HENT:  Head: Normocephalic and atraumatic.  Eyes: EOM are normal. No scleral icterus.  Neck: No thyromegaly present.  Cardiovascular: Normal rate and regular rhythm.  Pulmonary/Chest: Effort normal and breath sounds normal.  Abdominal: Soft. Bowel sounds are normal. He exhibits no distension.  Musculoskeletal: He exhibits no edema.  Feet:  Right Foot:  Protective Sensation: 5 sites tested. 5 sites sensed.  Skin Integrity: Positive for callus.  Left Foot:  Protective Sensation: 5 sites tested. 5 sites sensed.  Skin Integrity: Positive for callus.  Neurological: He is alert.  Skin: Skin is warm and dry. No pallor.  Psychiatric: He has a normal mood and affect. His behavior is normal. Judgment and thought content normal.   Diabetic Foot Form - Detailed   Diabetic Foot Exam - detailed Diabetic Foot exam was performed with the following findings:  Yes 05/13/2018 11:38 AM  Visual Foot Exam completed.:  Yes  Pulse Foot Exam completed.:  Yes  Right Dorsalis Pedis:  Present Left Dorsalis Pedis:  Present  Sensory Foot Exam Completed.:  Yes Semmes-Weinstein Monofilament Test R Site 1-Great Toe:  Pos L Site 1-Great Toe:  Pos    Comments:  Thickened toenails on several toes (both feet); some mild maceration between toes; thickened callus on left heel, right medial 2nd toe      Results for orders placed or performed in visit on 11/08/17  Hemoglobin A1c  Result Value Ref Range   Hgb A1c MFr Bld 6.8 (H) <5.7 % of total Hgb   Mean Plasma Glucose 148 (calc)   eAG (mmol/L) 8.2 (calc)  Microalbumin / creatinine urine ratio  Result Value Ref Range   Creatinine, Urine 172 20 - 320 mg/dL   Microalb, Ur 0.4 mg/dL   Microalb Creat Ratio 2 <30 mcg/mg creat      Assessment & Plan:   Problem List Items Addressed This Visit      Cardiovascular and Mediastinum   Ischemic cardiomyopathy (Chronic)    F/u with cardiologist      CHF  (congestive heart failure) ([place]) (Chronic)    Avoiding salt; f/u with cardiologist; does not appear fluid overloaded today        Endocrine   Type 2 diabetes mellitus, controlled ([place]) - Primary    Foot exam; refer for eye exam; check labs today  Relevant Orders   Hemoglobin A1C   Ambulatory referral to Optometry   Diabetes mellitus ([place]) (Chronic)    With onychomycosis; foot exam by MD; refer to optometry, [facility]; he refuses flu shot and pneumonia vaccine      Relevant Medications   Blood Gluc Meter Disp-Strips (BLOOD GLUCOSE METER DISPOSABLE) DEVI   Other Relevant Orders   Lipid panel     Other   Tobacco use    Encouragement given for cessation; he is not interested in chantix and will try cold [place]      Screening for colon cancer    Encouraged cologuard      Low HDL (under 40)    May be inherited, may be related to smoking; smoking cessation encouraged; check today; continue statin      Relevant Orders   Lipid panel    Other Visit Diagnoses    Screening for HIV (human immunodeficiency virus)       Relevant Orders   HIV Antibody (routine testing w rflx)   Encounter for hepatitis C screening test for low risk patient       Relevant Orders   Hepatitis C antibody   Medication monitoring encounter       Relevant Orders   COMPLETE METABOLIC PANEL WITH GFR   Refused influenza vaccine       patient refused flu vaccine after discussionw ith MD   Refused Streptococcus pneumoniae vaccination       patient refused pneumonia vacc after discussion with MD; I explained possible drug resistant organisms, could have a pt with pneumonia who dies b/c no ABX work       Follow up plan: Return in about 6 months (around 11/11/2018) for follow-up visit with Dr. Sanda Klein; if A1c is 7 or higher, we'll see you in 3 months.  An after-visit summary was printed and given to the patient at [facility].  Please see the patient instructions which may contain other information and  recommendations beyond what is mentioned above in the assessment and plan.  Meds ordered this encounter  Medications  . Blood Gluc Meter Disp-Strips (BLOOD GLUCOSE METER DISPOSABLE) DEVI    Sig: Check fingerstick blood sugars once a day; E11.69, LON 99 months    Dispense:  100 each    Refill:  1    Orders Placed This Encounter  Procedures  . Hemoglobin A1C  . Hepatitis C antibody  . HIV Antibody (routine testing w rflx)  . Lipid panel  . COMPLETE METABOLIC PANEL WITH GFR  . Ambulatory referral to Optometry

## 2018-05-13 NOTE — Assessment & Plan Note (Signed)
F/u with cardiologist 

## 2018-05-13 NOTE — Patient Instructions (Addendum)
I do encourage you to quit smoking Call [phone] to sign up for smoking cessation classes You can call 1-800-QUIT-NOW to talk with a smoking cessation coach   Health Risks of Smoking Smoking cigarettes is very bad for your health. Tobacco smoke has over 200 known poisons in it. It contains the poisonous gases nitrogen oxide and carbon monoxide. There are over 60 chemicals in tobacco smoke that cause cancer. Smoking is difficult to quit because a chemical in tobacco, called nicotine, causes addiction or dependence. When you smoke and inhale, nicotine is absorbed rapidly into the bloodstream through your lungs. Both inhaled and non-inhaled nicotine may be addictive. What are the risks of cigarette smoke? Cigarette smokers have an increased risk of many serious medical problems, including:  Lung cancer.  Lung disease, such as pneumonia, bronchitis, and emphysema.  Chest pain (angina) and heart attack because the heart is not getting enough oxygen.  Heart disease and peripheral blood vessel disease.  High blood pressure (hypertension).  Stroke.  Oral cancer, including cancer of the lip, mouth, or voice box.  Bladder cancer.  Pancreatic cancer.  Cervical cancer.  Pregnancy complications, including premature birth.  Stillbirths and smaller newborn babies, birth defects, and genetic damage to sperm.  Early menopause.  Lower estrogen level for women.  Infertility.  Facial wrinkles.  Blindness.  Increased risk of broken bones (fractures).  Senile dementia.  Stomach ulcers and internal bleeding.  Delayed wound healing and increased risk of complications during surgery.  Even smoking lightly shortens your life expectancy by several years.  Because of secondhand smoke exposure, children of smokers have an increased risk of the following:  Sudden infant death syndrome (SIDS).  Respiratory infections.  Lung cancer.  Heart disease.  Ear infections.  What  are the benefits of quitting? There are many health benefits of quitting smoking. Here are some of them:  Within days of quitting smoking, your risk of having a heart attack decreases, your blood flow improves, and your lung capacity improves. Blood pressure, pulse rate, and breathing patterns start returning to normal soon after quitting.  Within months, your lungs may clear up completely.  Quitting for 10 years reduces your risk of developing lung cancer and heart disease to almost that of a nonsmoker.  People who quit may see an improvement in their overall quality of life.  How do I quit smoking? Smoking is an addiction with both physical and psychological effects, and longtime habits can be hard to change. Your health care provider can recommend:  Programs and community resources, which may include group support, education, or talk therapy.  Prescription medicines to help reduce cravings.  Nicotine replacement products, such as patches, gum, and nasal sprays. Use these products only as directed. Do not replace cigarette smoking with electronic cigarettes, which are commonly called e-cigarettes. The safety of e-cigarettes is not known, and some may contain harmful chemicals.  A combination of two or more of these methods.  Where to find more information:  American Lung Association: www.lung.org  American Cancer Society: www.cancer.org Summary  Smoking cigarettes is very bad for your health. Cigarette smokers have an increased risk of many serious medical problems, including several cancers, heart disease, and stroke.  Smoking is an addiction with both physical and psychological effects, and longtime habits can be hard to change.  By stopping right away, you can greatly reduce the risk of medical problems for you and your family.  To help you quit smoking, your health care provider can recommend  programs, community resources, prescription medicines, and nicotine replacement  products such as patches, gum, and nasal sprays. This information is not intended to replace advice given to you by your health care provider. Make sure you discuss any questions you have with your health care provider. Document Released: 09/14/2004 Document Revised: 08/11/2016 Document Reviewed: 08/11/2016 Elsevier Interactive Patient Education  2017 [employer].

## 2018-05-13 NOTE — Assessment & Plan Note (Addendum)
Encouragement given for cessation; he is not interested in chantix and will try cold [place]

## 2018-05-13 NOTE — Assessment & Plan Note (Signed)
May be inherited, may be related to smoking; smoking cessation encouraged; check today; continue statin

## 2018-05-13 NOTE — Assessment & Plan Note (Addendum)
With onychomycosis; foot exam by MD; refer to optometry, [place]; he refuses flu shot and pneumonia vaccine

## 2018-05-13 NOTE — Assessment & Plan Note (Signed)
Encouraged cologuard

## 2018-05-13 NOTE — Assessment & Plan Note (Signed)
Foot exam; refer for eye exam; check labs today

## 2018-05-13 NOTE — Assessment & Plan Note (Signed)
Avoiding salt; f/u with cardiologist; does not appear fluid overloaded today

## 2018-05-15 ENCOUNTER — Telehealth: Payer: Self-pay | Admitting: Family Medicine

## 2018-05-15 LAB — HEPATITIS C ANTIBODY
Hepatitis C Ab: NONREACTIVE
SIGNAL TO CUT-OFF: 0.02 (ref <1.00)

## 2018-05-15 LAB — COMPLETE METABOLIC PANEL WITH GFR
AG Ratio: 1.7 (calc) (ref 1.0–2.5)
ALKALINE PHOSPHATASE (APISO): 59 U/L (ref 40–115)
ALT: 25 U/L (ref 9–46)
AST: 17 U/L (ref 10–35)
Albumin: 4.3 g/dL (ref 3.6–5.1)
BUN: 15 mg/dL (ref 7–25)
CO2: 24 mmol/L (ref 20–32)
CREATININE: 1 mg/dL (ref 0.70–1.25)
Calcium: 9.4 mg/dL (ref 8.6–10.3)
Chloride: 106 mmol/L (ref 98–110)
GFR, Est African American: 92 mL/min/{1.73_m2} (ref >=60)
GFR, Est Non African American: 80 mL/min/{1.73_m2} (ref >=60)
GLUCOSE: 85 mg/dL (ref 65–139)
Globulin: 2.6 g/dL (calc) (ref 1.9–3.7)
Potassium: 4 mmol/L (ref 3.5–5.3)
Sodium: 140 mmol/L (ref 135–146)
TOTAL PROTEIN: 6.9 g/dL (ref 6.1–8.1)
Total Bilirubin: 0.5 mg/dL (ref 0.2–1.2)

## 2018-05-15 LAB — LIPID PANEL
Cholesterol: 109 mg/dL (ref <200)
HDL: 26 mg/dL — ABNORMAL LOW (ref >40)
LDL Cholesterol (Calc): 67 mg/dL (calc)
NON-HDL CHOLESTEROL (CALC): 83 mg/dL (ref <130)
TRIGLYCERIDES: 82 mg/dL (ref <150)
Total CHOL/HDL Ratio: 4.2 (calc) (ref <5.0)

## 2018-05-15 LAB — HEMOGLOBIN A1C
EAG (MMOL/L): 6.8 (calc)
Hgb A1c MFr Bld: 5.9 % of total Hgb — ABNORMAL HIGH (ref <5.7)
MEAN PLASMA GLUCOSE: 123 (calc)

## 2018-05-15 LAB — HIV ANTIBODY (ROUTINE TESTING W REFLEX): HIV: NONREACTIVE

## 2018-05-15 NOTE — Telephone Encounter (Signed)
Note received from patient's insurance company about use of biguanide in heart failure  Dr. Fletcher Anon: Do you have any concern about patient remaining on metformin with his heart failure? Thank you

## 2018-05-16 MED ORDER — ERTUGLIFLOZIN L-PYROGLUTAMICAC 5 MG PO TABS: 1.0000 | ORAL_TABLET | Freq: Every day | ORAL | 5 refills | Status: DC

## 2018-05-16 NOTE — Telephone Encounter (Signed)
Please let the patient know that his heart doctor agrees with stopping the metformin Do not take any more metformin We'll have him start steglatro, and he can pick up a savings card and coupon here at the office

## 2018-05-16 NOTE — Telephone Encounter (Signed)
Pt.notified

## 2018-05-16 NOTE — Telephone Encounter (Signed)
I think we should avoid metformin if possible.  As an alternative, there is now very good data for SGLT 2 inhibitors in patients with heart disease and heart failure.  Thank you

## 2018-05-27 ENCOUNTER — Telehealth: Payer: Self-pay | Admitting: Cardiovascular Disease

## 2018-05-27 NOTE — Telephone Encounter (Signed)
I called and spoke with Mr. [place]. Clarified with him that he is having trouble affording eplerenone. This has gone from $50/ month to $65/ month. He states he does have his other medications. He has about 3 days worth of eplerenone left.   I advised him we will review with Dr. Fletcher Anon and given him a call back. He confirms he has Pharmacist, community and uses [facility].

## 2018-05-27 NOTE — Telephone Encounter (Signed)
Pt is unable to affor diuretic and would like a recommendation for another. Please call to discuss.

## 2018-05-27 NOTE — Telephone Encounter (Signed)
Per [facility] Spirolactone is a tier one substitute

## 2018-05-28 NOTE — Telephone Encounter (Signed)
Switch to spironolactone 25 mg once daily.

## 2018-05-28 NOTE — Telephone Encounter (Signed)
Patient is out of medication please advise asap per [facility]

## 2018-05-29 MED ORDER — SPIRONOLACTONE 25 MG PO TABS: 25.0000 mg | ORAL_TABLET | Freq: Every day | ORAL | 3 refills | Status: DC

## 2018-05-29 NOTE — Telephone Encounter (Signed)
Called patient. He verbalized understanding to stop eplerenone and start spironolactone 25 mg once a day. Rx sent to pharmacy. He was very [employer] attorney.

## 2018-06-11 DIAGNOSIS — E113293 Type 2 diabetes mellitus with mild nonproliferative diabetic retinopathy without macular edema, bilateral: Secondary | ICD-10-CM | POA: Diagnosis not present

## 2018-06-11 LAB — HM DIABETES EYE EXAM

## 2018-06-13 ENCOUNTER — Other Ambulatory Visit: Payer: Self-pay | Admitting: Cardiovascular Disease

## 2018-06-14 ENCOUNTER — Other Ambulatory Visit: Payer: Self-pay | Admitting: Cardiovascular Disease

## 2018-06-16 ENCOUNTER — Other Ambulatory Visit: Payer: Self-pay | Admitting: Cardiovascular Disease

## 2018-06-28 ENCOUNTER — Ambulatory Visit (INDEPENDENT_AMBULATORY_CARE_PROVIDER_SITE_OTHER): Payer: 59 | Admitting: Nurse Practitioner

## 2018-06-28 ENCOUNTER — Encounter: Payer: Self-pay | Admitting: Nurse Practitioner

## 2018-06-28 VITALS — BP 124/74 | HR 72 | Ht 72.0 in | Wt 213.2 lb

## 2018-06-28 DIAGNOSIS — E785 Hyperlipidemia, unspecified: Secondary | ICD-10-CM

## 2018-06-28 DIAGNOSIS — I2510 Atherosclerotic heart disease of native coronary artery without angina pectoris: Secondary | ICD-10-CM

## 2018-06-28 DIAGNOSIS — I5022 Chronic systolic (congestive) heart failure: Secondary | ICD-10-CM | POA: Diagnosis not present

## 2018-06-28 DIAGNOSIS — I255 Ischemic cardiomyopathy: Secondary | ICD-10-CM | POA: Diagnosis not present

## 2018-06-28 DIAGNOSIS — I10 Essential (primary) hypertension: Secondary | ICD-10-CM

## 2018-06-28 DIAGNOSIS — Z720 Tobacco use: Secondary | ICD-10-CM

## 2018-06-28 NOTE — Progress Notes (Signed)
Office Visit    Patient Name: Lawrence Holmes Date of Encounter: 06/28/2018  Primary Care Provider:  Arnetha Courser, MD Primary Cardiologist:  Kathlyn Sacramento, MD  Chief Complaint    62 year old male with a history of late presenting STEMI/CAD in December 2018 (medically managed), ischemic cardia myopathy/HFrEF (EF 25-35%  improved to 40-45%), type 2 diabetes mellitus, and tobacco abuse, who presents for follow-up related to heart failure.  Past Medical History    Past Medical History:  Diagnosis Date  . Chronic systolic heart failure ([place]) 07/2017   a. 07/2017 Echo: EF 30-35%, Gr1 DD, inflat, and inf AK, mild to mod MR, mildly dil LA/RA; b. 10/2017 Echo: EF 40-45%, basal-midinf and infsept AK. Mild LVH. Mild MR. Nl RV fxn.  . Coronary artery disease 07/2017   a. 07/2017 Late presenting inferior STEMI/Cath: LM nl, LAD min irregs, LCX 100d (L->L collats), RCA 100p (L->R collats), EF 25-35%-->Med Rx; b. 10/2017 ETT (DOT): Ex time 7:38, baseline antlat TWI, no acute changes.   . Diabetes mellitus without complication ([place])   . Hyperlipidemia   . Ischemic cardiomyopathy    a. 07/2017 Echo: EF 30-35%; b. 10/2017 Echo: EF 40-45%.  . Tobacco use    Past Surgical History:  Procedure Laterality Date  . EYE SURGERY    . LEFT HEART CATH AND CORONARY ANGIOGRAPHY N/A 07/21/2017   Procedure: LEFT HEART CATH AND CORONARY ANGIOGRAPHY;  Surgeon: Wellington Hampshire, MD;  Location: [facility];  Service: Cardiovascular;  Laterality: N/A;    Allergies  No Known Allergies  History of Present Illness    62 year old male with the above complex past medical history including CAD status post late presenting inferior STEMI in December 2018.  Catheterization revealed total occlusions of the distal left circumflex proximal RCA.  EF was 25 to 35% by ventriculography and subsequently 30 to 35% by echocardiogram.  The culprit was felt to be the right coronary artery and the interventional team was not  able to cross this with a wire.  As result, he was medically managed.  He has been managed with beta-blocker, Entresto, and MRA (initially eplerenone but switched to Spironolactone due to cost) as well as DAPT and statin.  His EF improved to 40-45% by echo in March 2019.  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  Since his last visit in March, he has done well.  He is back to work and in that setting, he has been exercising less.  He has recently joined [employer] and plans on going there after work today.  He has been weighing himself daily and is careful with his salt intake.  He continues to smoke about a half a pack per day and says he does hope to quit within the next couple of months.  He denies chest pain, palpitations, PND, orthopnea, dyspnea, dizziness, syncope, edema, or early satiety.  Home Medications    Prior to Admission medications   Medication Sig Start Date End Date Taking? Authorizing Provider  aspirin 81 MG chewable tablet Chew 1 tablet (81 mg total) by mouth daily. 07/24/17  Yes Demetrios Loll, MD  atorvastatin (LIPITOR) 80 MG tablet TAKE 1 TABLET (80 MG TOTAL) BY MOUTH DAILY AT 6 PM. 06/17/18  Yes Wellington Hampshire, MD  Blood Gluc Meter Disp-Strips (BLOOD GLUCOSE METER DISPOSABLE) DEVI Check fingerstick blood sugars once  a day; E11.69, LON 99 months 05/13/18  Yes Lada, Satira Anis, MD  carvedilol (COREG) 3.125 MG tablet TAKE 1 TABLET (3.125 MG TOTAL) BY MOUTH 2 (TWO) TIMES DAILY WITH A MEAL. 06/13/18  Yes Wellington Hampshire, MD  clopidogrel (PLAVIX) 75 MG tablet TAKE 1 TABLET (75 MG TOTAL) BY MOUTH DAILY WITH BREAKFAST. 06/14/18  Yes Wellington Hampshire, MD  Ertugliflozin L-PyroglutamicAc (STEGLATRO) 5 MG TABS Take 1 tablet by mouth daily. 05/16/18  Yes Lada, Satira Anis, MD  nitroGLYCERIN (NITROSTAT) 0.4 MG SL tablet Place 1 tablet (0.4 mg total) under the tongue every 5  (five) minutes as needed for chest pain. 07/23/17  Yes Demetrios Loll, MD  sacubitril-valsartan (ENTRESTO) 24-26 MG Take 1 tablet by mouth 2 (two) times daily. 08/03/17  Yes Wellington Hampshire, MD  spironolactone (ALDACTONE) 25 MG tablet Take 1 tablet (25 mg total) by mouth daily. 05/29/18 08/27/18 Yes Wellington Hampshire, MD    Review of Systems    Doing well.  He denies chest pain, palpitations, dyspnea, pnd, orthopnea, n, v, dizziness, syncope, edema, weight gain, or early satiety.  All other systems reviewed and are otherwise negative except as noted above.  Physical Exam    VS:  BP 124/74 (BP Location: Left Arm, Patient Position: Sitting, Cuff Size: Normal)   Pulse 72   Ht 6' (1.829 m)   Wt 213 lb 4 oz (96.7 kg)   BMI 28.92 kg/m  , BMI Body mass index is 28.92 kg/m. GEN: Well nourished, well developed, in no acute distress. HEENT: normal. Neck: Supple, no JVD, carotid bruits, or masses. Cardiac: RRR, no murmurs, rubs, or gallops. No clubbing, cyanosis, edema.  Radials/DP/PT 2+ and equal bilaterally.  Respiratory:  Respirations regular and unlabored, clear to auscultation bilaterally. GI: Soft, nontender, nondistended, BS + x 4. MS: no deformity or atrophy. Skin: warm and dry, no rash. Neuro:  Strength and sensation are intact. Psych: Normal affect.  Accessory Clinical Findings    ECG personally reviewed by me today -regular sinus rhythm, 72, prior inferolateral infarct- no acute changes.  Lab Results  Component Value Date   CHOL 109 05/13/2018   HDL 26 (L) 05/13/2018   LDLCALC 67 05/13/2018   TRIG 82 05/13/2018   CHOLHDL 4.2 05/13/2018    Lab Results  Component Value Date   ALT 25 05/13/2018   AST 17 05/13/2018   ALKPHOS 63 09/12/2017   BILITOT 0.5 05/13/2018   Lab Results  Component Value Date   CREATININE 1.00 05/13/2018   BUN 15 05/13/2018   NA 140 05/13/2018   K 4.0 05/13/2018   CL 106 05/13/2018   CO2 24 05/13/2018    Lab Results  Component Value Date    HGBA1C 5.9 (H) 05/13/2018    Assessment & Plan    1.  Coronary artery disease: Status post late presenting inferior STEMI in December 2018 with diagnostic catheterization at that time revealing an occluded distal left circumflex as well as an occluded proximal RCA.  The RCA was felt to be the culprit however the interventional team was unable to wire this vessel and therefore he has been medically managed.  ETT in March showed good exercise tolerance without acute ST or T changes.  He is continued to do well since his last visit without chest pain, dyspnea, or limitations in activity.  He has not been exercising as much as he had been, since returning to work, but does plan to go back to the gym beginning today.  He remains on aspirin, statin, beta-blocker, Plavix.  2.  HFrEF/ischemic cardiomyopathy: EF 30 to 35% the time of his MI with subsequent improvement to 40-45% by echo in March 2019.  His weight has been stable and he reports compliance with medications.  We discussed the importance of daily weights, sodium restriction, medication compliance, and symptom reporting and he verbalizes understanding.  He remains on beta-blocker, Entresto, and Spironolactone therapy with stable renal function and electrolytes in September.  3.  Essential hypertension: Stable on beta-blocker, Entresto, and Spironolactone.  4.  Hyperlipidemia: LDL 67 in September with normal LFTs.  Continue high potency statin therapy.  5.  Tobacco abuse: Patient cut back to about 5 cigarettes a day following his MI but is now back up to about half a pack a day.  He says that he does plan on quitting and hopes that getting back into the gym will help him to do so.  We discussed the importance of complete cessation.  He is not currently interested in any pharmacologic aids such as nicotine patch or Chantix.  6.  DMII:  On SGLT2 inhibitor.  A1c 5.9 in Sept.  7. Disposition: Follow-up in 6 months or sooner if necessary.  Murray Hodgkins, NP 06/28/2018, 9:49 AM

## 2018-06-28 NOTE — Patient Instructions (Signed)
Medication Instructions:  Your physician recommends that you continue on your current medications as directed. Please refer to the Current Medication list given to you today.  If you need a refill on your cardiac medications before your next appointment, please call your pharmacy.   Lab work: No new lab orders today  If you have labs (blood work) drawn today and your tests are completely normal, you will receive your results only by: [name] MyChart Message (if you have MyChart) OR . A paper copy in the mail If you have any lab test that is abnormal or we need to change your treatment, we will call you to review the results.  Testing/Procedures: No tests ordered   Follow-Up: At [facility], you and your health needs are our priority.  As part of our continuing mission to provide you with exceptional heart care, we have created designated Provider Care Teams.  These Care Teams include your primary Cardiologist (physician) and Advanced Practice Providers (APPs -  Physician Assistants and Nurse Practitioners) who all work together to provide you with the care you need, when you need it. You will need a follow up appointment in 6 months.  Please call our office 2 months in advance to schedule this appointment.  You may see Kathlyn Sacramento, MD or one of the following Advanced Practice Providers on your designated Care Team:   Murray Hodgkins, NP Christell Faith, PA-C . Marrianne Mood, PA-C  Any Other Special Instructions Will Be Listed Below (If Applicable). None

## 2018-07-08 ENCOUNTER — Ambulatory Visit: Payer: 59 | Admitting: Podiatry

## 2018-07-15 ENCOUNTER — Encounter: Payer: Self-pay | Admitting: Podiatry

## 2018-07-15 ENCOUNTER — Ambulatory Visit (INDEPENDENT_AMBULATORY_CARE_PROVIDER_SITE_OTHER): Payer: 59 | Admitting: Podiatry

## 2018-07-15 DIAGNOSIS — B351 Tinea unguium: Secondary | ICD-10-CM

## 2018-07-15 DIAGNOSIS — E119 Type 2 diabetes mellitus without complications: Secondary | ICD-10-CM

## 2018-07-15 DIAGNOSIS — Q828 Other specified congenital malformations of skin: Secondary | ICD-10-CM | POA: Diagnosis not present

## 2018-07-15 DIAGNOSIS — M79675 Pain in left toe(s): Secondary | ICD-10-CM

## 2018-07-15 DIAGNOSIS — M79674 Pain in right toe(s): Secondary | ICD-10-CM | POA: Diagnosis not present

## 2018-07-15 NOTE — Progress Notes (Addendum)
This patient presents to the office with chief complaint of long thick nails and diabetic feet.  This patient  says there  is  no pain and discomfort in his  feet.  This patient says there are long thick painful nails.  These nails are painful walking and wearing shoes. Patient also has painful callus under the outside ball of left foot.  Patient has no history of infection or drainage from both feet.  Patient is unable to  self treat his own nails . This patient presents  to the office today for treatment of the  long nails and a foot evaluation due to history of  diabetes.  General Appearance  Alert, conversant and in no acute stress.  Vascular  Dorsalis pedis and posterior tibial  pulses are palpable  bilaterally.  Capillary return is within normal limits  bilaterally. Temperature is within normal limits  bilaterally.  Neurologic  Senn-Weinstein monofilament wire test within normal limits  bilaterally. Muscle power within normal limits bilaterally.  Nails Thick disfigured discolored nails with subungual debris  from hallux to fifth toes bilaterally. No evidence of bacterial infection or drainage bilaterally.  Orthopedic  No limitations of motion of motion feet .  No crepitus or effusions noted.  No bony pathology or digital deformities noted.No inversion/eversion due to heel fracture and ankle fracture left foot. Plantar flexed fifth metatarsal left foot.  Skin  normotropic skin with no porokeratosis noted bilaterally.  No signs of infections or ulcers noted.   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 Heel callus  B/l  Onychomycosis.  Porokeratosis sub 5th met left foot.  Diabetes with no foot complications  IE  Debride nails x 10. Debridement of porokeratosis.  A diabetic foot exam was performed and there is no evidence of any vascular or neurologic pathology.   RTC 10 weeks    Gardiner Barefoot DPM

## 2018-07-23 ENCOUNTER — Telehealth: Payer: Self-pay | Admitting: Cardiovascular Disease

## 2018-07-23 NOTE — Telephone Encounter (Signed)
Returned the call to the patient. He called requesting his GXT results be faxed to [phone]. Fax has been completed.

## 2018-07-23 NOTE — Telephone Encounter (Signed)
° ° °  Please call with stress test results °

## 2018-07-26 ENCOUNTER — Telehealth: Payer: Self-pay | Admitting: *Deleted

## 2018-07-26 ENCOUNTER — Telehealth: Payer: Self-pay

## 2018-07-26 NOTE — Telephone Encounter (Signed)
Pt needing PA for Entresto 24-26 mg tablet.  PA has been submitted through [place] awaiting approval.

## 2018-07-29 NOTE — Telephone Encounter (Signed)
Entresto 24-26 mg previously approved 08/03/2017/-08/03/2019 per [employer]. Left message notifying patient of approval.

## 2018-07-29 NOTE — Telephone Encounter (Signed)
Error

## 2018-07-30 ENCOUNTER — Telehealth: Payer: Self-pay | Admitting: Cardiovascular Disease

## 2018-07-30 NOTE — Telephone Encounter (Signed)
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

## 2018-07-30 NOTE — Telephone Encounter (Signed)
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

## 2018-08-12 ENCOUNTER — Other Ambulatory Visit: Payer: Self-pay | Admitting: Cardiovascular Disease

## 2018-08-15 ENCOUNTER — Telehealth: Payer: Self-pay | Admitting: Family Medicine

## 2018-08-15 NOTE — Telephone Encounter (Signed)
Pt stated that he does not take flu shots and will work on completing his cologuard.

## 2018-08-15 NOTE — Telephone Encounter (Signed)
Please follow-up with patient on his outstanding cologuard He also is missing flu shot, offer or document if done

## 2018-09-23 ENCOUNTER — Ambulatory Visit: Payer: 59 | Admitting: Podiatry

## 2018-10-04 ENCOUNTER — Telehealth: Payer: Self-pay | Admitting: Family Medicine

## 2018-10-04 NOTE — Telephone Encounter (Signed)
Patient has care gap for colon cancer screening and an unresolved Cologuard order Please contact patient, urge them to complete the Cologuard kit; offer to re-order if needed Thank you

## 2018-10-04 NOTE — Telephone Encounter (Signed)
Pt notified, will work on

## 2018-11-11 ENCOUNTER — Other Ambulatory Visit: Payer: Self-pay

## 2018-11-11 ENCOUNTER — Encounter: Payer: Self-pay | Admitting: Nurse Practitioner

## 2018-11-11 ENCOUNTER — Ambulatory Visit (INDEPENDENT_AMBULATORY_CARE_PROVIDER_SITE_OTHER): Payer: 59 | Admitting: Nurse Practitioner

## 2018-11-11 VITALS — BP 120/68 | HR 71 | Temp 97.4°F | Resp 12 | Ht 73.0 in | Wt 216.8 lb

## 2018-11-11 DIAGNOSIS — E119 Type 2 diabetes mellitus without complications: Secondary | ICD-10-CM

## 2018-11-11 DIAGNOSIS — I252 Old myocardial infarction: Secondary | ICD-10-CM

## 2018-11-11 DIAGNOSIS — E786 Lipoprotein deficiency: Secondary | ICD-10-CM | POA: Diagnosis not present

## 2018-11-11 DIAGNOSIS — Z1211 Encounter for screening for malignant neoplasm of colon: Secondary | ICD-10-CM

## 2018-11-11 DIAGNOSIS — Z720 Tobacco use: Secondary | ICD-10-CM

## 2018-11-11 DIAGNOSIS — Z716 Tobacco abuse counseling: Secondary | ICD-10-CM

## 2018-11-11 NOTE — Patient Instructions (Addendum)
Please review the medicine list below to ensure you are taking all of the following medications. Please call us to update your medicine list if you have stopped taking this.   - Drink at least 64 ounces of water a day, cut off drinking 2 hours before bedtime.   - Finding something different to do when you are bored, work on cutting down the amount of cigarettes you are smoking each week.   Coping with Quitting Smoking Quitting smoking is a physical and mental challenge. You will face cravings, withdrawal symptoms, and temptation. Before quitting, work with your health care provider to make a plan that can help you cope. Preparation can help you quit and keep you from giving in. How can I cope with cravings? Cravings usually last for 5-10 minutes. If you get through it, the craving will pass. Consider taking the following actions to help you cope with cravings:  Keep your mouth busy: ? Chew sugar-free gum. ? Suck on hard candies or a straw. ? Brush your teeth.  Keep your hands and body busy: ? Immediately change to a different activity when you feel a craving. ? Squeeze or play with a ball. ? Do an activity or a hobby, like making bead jewelry, practicing needlepoint, or working with wood. ? Mix up your normal routine. ? Take a short exercise break. Go for a quick walk or run up and down stairs. ? Spend time in public places where smoking is not allowed.  Focus on doing something kind or helpful for someone else.  Call a friend or family member to talk during a craving.  Join a support group.  Call a quit line, such as 1-800-QUIT-NOW.  Talk with your health care provider about medicines that might help you cope with cravings and make quitting easier for you. How can I deal with withdrawal symptoms? Your body may experience negative effects as it tries to get used to not having nicotine in the system. These effects are called withdrawal symptoms. They may include:  Feeling hungrier  than normal.  Trouble concentrating.  Irritability.  Trouble sleeping.  Feeling depressed.  Restlessness and agitation.  Craving a cigarette. To manage withdrawal symptoms:  Avoid places, people, and activities that trigger your cravings.  Remember why you want to quit.  Get plenty of sleep.  Avoid coffee and other caffeinated drinks. These may worsen some of your symptoms. How can I handle social situations? Social situations can be difficult when you are quitting smoking, especially in the first few weeks. To manage this, you can:  Avoid parties, bars, and other social situations where people might be smoking.  Avoid alcohol.  Leave right away if you have the urge to smoke.  Explain to your family and friends that you are quitting smoking. Ask for understanding and support.  Plan activities with friends or family where smoking is not an option. What are some ways I can cope with stress? Wanting to smoke may cause stress, and stress can make you want to smoke. Find ways to manage your stress. Relaxation techniques can help. For example:  Breathe slowly and deeply, in through your nose and out through your mouth.  Listen to soothing, relaxing music.  Talk with a family member or friend about your stress.  Light a candle.  Soak in a bath or take a shower.  Think about a peaceful place. What are some ways I can prevent weight gain? Be aware that many people gain weight after they quit  smoking. However, not everyone does. To keep from gaining weight, have a plan in place before you quit and stick to the plan after you quit. Your plan should include:  Having healthy snacks. When you have a craving, it may help to: ? Eat plain popcorn, crunchy carrots, celery, or other cut vegetables. ? Chew sugar-free gum.  Changing how you eat: ? Eat small portion sizes at meals. ? Eat 4-6 small meals throughout the day instead of 1-2 large meals a day. ? Be mindful when you  eat. Do not watch television or do other things that might distract you as you eat.  Exercising regularly: ? Make time to exercise each day. If you do not have time for a long workout, do short bouts of exercise for 5-10 minutes several times a day. ? Do some form of strengthening exercise, like weight lifting, and some form of aerobic exercise, like running or swimming.  Drinking plenty of water or other low-calorie or no-calorie drinks. Drink 6-8 glasses of water daily, or as much as instructed by your health care provider. Summary  Quitting smoking is a physical and mental challenge. You will face cravings, withdrawal symptoms, and temptation to smoke again. Preparation can help you as you go through these challenges.  You can cope with cravings by keeping your mouth busy (such as by chewing gum), keeping your body and hands busy, and making calls to family, friends, or a helpline for people who want to quit smoking.  You can cope with withdrawal symptoms by avoiding places where people smoke, avoiding drinks with caffeine, and getting plenty of rest.  Ask your health care provider about the different ways to prevent weight gain, avoid stress, and handle social situations. This information is not intended to replace advice given to you by your health care provider. Make sure you discuss any questions you have with your health care provider. Document Released: 08/04/2016 Document Revised: 08/04/2016 Document Reviewed: 08/04/2016 Elsevier Interactive Patient Education  2019 [employer].

## 2018-11-11 NOTE — Progress Notes (Signed)
Name: Lawrence Holmes   MRN: [id number]    DOB: [birth year]   Date:11/11/2018       Progress Note  Subjective  Chief Complaint  Chief Complaint  Patient presents with  . Follow-up    HPI  Congestive heart failure & Histroy of STEMI Sees cardiologist Dr. Fletcher Anon. Rx. lipitor 80mg , coreg, entresto, and spironolactone; takes 81mg  aspirin and plavix daily. States he is taking these as prescribed.  He denies chest pain, palpitations, PND, orthopnea, dyspnea, dizziness, syncope, edema  Diabetes Mellitus rx steglatro 5mg ; denies polyphagia, polydipsia- endorses increased urination when he takes the water pill.  Lab Results  Component Value Date   HGBA1C 5.9 (H) 05/13/2018    Tobacco use Smokes 0.5ppd, does not chew tobacco, he is interested in quitting,states did it years ago cold [place], thinks he can do it again but he just smokes when he is bored. 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    PHQ2/9: Depression screen [facility] 2/9 11/11/2018 05/13/2018 05/13/2018 02/08/2018 11/08/2017  Decreased Interest 0 0 0 0 0  Down, Depressed, Hopeless 0 0 0 0 0  PHQ - 2 Score 0 0 0 0 0  Altered sleeping 0 0 - - -  Tired, decreased energy 0 0 - - -  Change in appetite 0 0 - - -  Feeling bad or failure about yourself  0 0 - - -  Trouble concentrating 0 0 - - -  Moving slowly or fidgety/restless 0 0 - - -  Suicidal thoughts 0 0 - - -  PHQ-9 Score 0 0 - - -  Difficult doing work/chores Not difficult at all Not difficult at all - - -    PHQ reviewed. Negative  Patient Active Problem List   Diagnosis Date Noted  . Low HDL (under 40) 05/13/2018  . Diabetes mellitus ([place]) 05/13/2018  . Type 2 diabetes mellitus, controlled ([place]) 02/08/2018  . History of myocardial infarction 11/08/2017  . Tobacco use 11/08/2017  . Ischemic cardiomyopathy 11/08/2017  . CHF (congestive heart failure) ([place]) 11/08/2017  . Foot callus 11/08/2017  . Onychomycosis 11/08/2017  . Screening for colon cancer 11/08/2017     Past Medical History:  Diagnosis Date  . Chronic systolic heart failure ([place]) 07/2017   a. 07/2017 Echo: EF 30-35%, Gr1 DD, inflat, and inf AK, mild to mod MR, mildly dil LA/RA; b. 10/2017 Echo: EF 40-45%, basal-midinf and infsept AK. Mild LVH. Mild MR. Nl RV fxn.  . Coronary artery disease 07/2017   a. 07/2017 Late presenting inferior STEMI/Cath: LM nl, LAD min irregs, LCX 100d (L->L collats), RCA 100p (L->R collats), EF 25-35%-->Med Rx; b. 10/2017 ETT (DOT): Ex time 7:38, baseline antlat TWI, no acute changes.   . Diabetes mellitus without complication ([place])   . Hyperlipidemia   . Ischemic cardiomyopathy    a. 07/2017 Echo: EF 30-35%; b. 10/2017 Echo: EF 40-45%.  . Tobacco use     Past Surgical History:  Procedure Laterality Date  . EYE SURGERY    . LEFT HEART CATH AND CORONARY ANGIOGRAPHY N/A 07/21/2017   Procedure: LEFT HEART CATH AND CORONARY ANGIOGRAPHY;  Surgeon: Wellington Hampshire, MD;  Location: [facility];  Service: Cardiovascular;  Laterality: N/A;    Social History   Tobacco Use  . Smoking status: Current Every Day Smoker    Packs/day: 0.50    Years: 25.00    Pack years: 12.50    Types: Cigarettes  . Smokeless tobacco: Never Used  .  Tobacco comment: 09/06/17 Tobacco cessation reviewed. Currently smoking about 10 cigs a day.  Substance Use Topics  . Alcohol use: Yes    Comment: rare     Current Outpatient Medications:  .  aspirin 81 MG chewable tablet, Chew 1 tablet (81 mg total) by mouth daily., Disp: 30 tablet, Rfl: 2 .  atorvastatin (LIPITOR) 80 MG tablet, TAKE 1 TABLET (80 MG TOTAL) BY MOUTH DAILY AT 6 PM., Disp: 90 tablet, Rfl: 1 .  carvedilol (COREG) 3.125 MG tablet, TAKE 1 TABLET (3.125 MG TOTAL) BY MOUTH 2 (TWO) TIMES DAILY WITH A MEAL., Disp: 180 tablet, Rfl: 2 .  ENTRESTO 24-26 MG, TAKE 1 TABLET BY MOUTH TWICE A DAY, Disp: 60 tablet, Rfl: 6 .  Ertugliflozin L-PyroglutamicAc (STEGLATRO) 5 MG TABS, Take 1 tablet by mouth daily., Disp: 30 tablet,  Rfl: 5 .  nitroGLYCERIN (NITROSTAT) 0.4 MG SL tablet, Place 1 tablet (0.4 mg total) under the tongue every 5 (five) minutes as needed for chest pain., Disp: 30 tablet, Rfl: 2 .  Blood Gluc Meter Disp-Strips (BLOOD GLUCOSE METER DISPOSABLE) DEVI, Check fingerstick blood sugars once a day; E11.69, LON 99 months, Disp: 100 each, Rfl: 1 .  clopidogrel (PLAVIX) 75 MG tablet, TAKE 1 TABLET (75 MG TOTAL) BY MOUTH DAILY WITH BREAKFAST. (Patient not taking: Reported on 11/11/2018), Disp: 90 tablet, Rfl: 3 .  spironolactone (ALDACTONE) 25 MG tablet, Take 1 tablet (25 mg total) by mouth daily., Disp: 90 tablet, Rfl: 3  No Known Allergies  ROS   No other specific complaints in a complete review of systems (except as listed in HPI above).  Objective  Vitals:   11/11/18 0955  BP: 120/68  Pulse: 71  Resp: 12  Temp: (!) 97.4 F (36.3 C)  TempSrc: Oral  SpO2: 94%  Weight: 216 lb 12.8 oz (98.3 kg)  Height: 6\' 1"  (1.854 m)    Body mass index is 28.6 kg/m.  Nursing Note and Vital Signs reviewed.  Physical Exam Vitals signs reviewed.  Constitutional:      Appearance: He is well-developed.  HENT:     Head: Normocephalic and atraumatic.  Neck:     Musculoskeletal: Normal range of motion and neck supple.     Vascular: No carotid bruit.  Cardiovascular:     Heart sounds: Normal heart sounds.  Pulmonary:     Effort: Pulmonary effort is normal.     Breath sounds: Normal breath sounds.  Abdominal:     General: Bowel sounds are normal.     Palpations: Abdomen is soft.     Tenderness: There is no abdominal tenderness.  Musculoskeletal: Normal range of motion.  Skin:    General: Skin is warm and dry.     Capillary Refill: Capillary refill takes less than 2 seconds.  Neurological:     Mental Status: He is alert and oriented to person, place, and time.     GCS: GCS eye subscore is 4. GCS verbal subscore is 5. GCS motor subscore is 6.     Sensory: No sensory deficit.  Psychiatric:         Speech: Speech normal.        Behavior: Behavior normal.        Thought Content: Thought content normal.        Judgment: Judgment normal.       No results found for this or any previous visit (from the past 48 hour(s)).  Assessment & Plan 1. Controlled type 2 diabetes mellitus without complication, without  long-term current use of insulin (HCC) Will represcribe current dose of stelgatro if A1C is stable. Discussed diet.  - HgB A1c - Lipid Profile - COMPLETE METABOLIC PANEL WITH GFR - Urine Microalbumin w/creat. ratio  2. Low HDL (under 40) - Lipid Profile  3. History of myocardial infarction Stable continue medication management and routine follow-up with cards.  - Lipid Profile - COMPLETE METABOLIC PANEL WITH GFR  4. Screening for colon cancer - Ambulatory referral to Gastroenterology  5. Tobacco use Discussed, states would be open to quitting   6. Encounter for smoking cessation counseling Patient does not seem nicotine dependent but uses it as coping mechanism for boredom, discussed alternate options and plan for weekly reduction, discussion greater than 3 minutes

## 2018-11-12 ENCOUNTER — Other Ambulatory Visit: Payer: Self-pay | Admitting: Family Medicine

## 2018-11-12 DIAGNOSIS — E119 Type 2 diabetes mellitus without complications: Secondary | ICD-10-CM

## 2018-11-12 LAB — MICROALBUMIN / CREATININE URINE RATIO
Creatinine, Urine: 99 mg/dL (ref 20–320)
Microalb Creat Ratio: 8 mcg/mg creat (ref <30)
Microalb, Ur: 0.8 mg/dL

## 2018-11-12 LAB — COMPLETE METABOLIC PANEL WITH GFR
AG RATIO: 1.4 (calc) (ref 1.0–2.5)
ALT: 50 U/L — ABNORMAL HIGH (ref 9–46)
AST: 20 U/L (ref 10–35)
Albumin: 4.1 g/dL (ref 3.6–5.1)
Alkaline phosphatase (APISO): 59 U/L (ref 35–144)
BILIRUBIN TOTAL: 0.6 mg/dL (ref 0.2–1.2)
BUN: 14 mg/dL (ref 7–25)
CALCIUM: 9.6 mg/dL (ref 8.6–10.3)
CHLORIDE: 106 mmol/L (ref 98–110)
CO2: 26 mmol/L (ref 20–32)
Creat: 1.1 mg/dL (ref 0.70–1.25)
GFR, EST NON AFRICAN AMERICAN: 71 mL/min/{1.73_m2} (ref >=60)
GFR, Est African American: 82 mL/min/{1.73_m2} (ref >=60)
GLOBULIN: 2.9 g/dL (ref 1.9–3.7)
Glucose, Bld: 97 mg/dL (ref 65–99)
POTASSIUM: 5.1 mmol/L (ref 3.5–5.3)
SODIUM: 140 mmol/L (ref 135–146)
Total Protein: 7 g/dL (ref 6.1–8.1)

## 2018-11-12 LAB — HEMOGLOBIN A1C
HEMOGLOBIN A1C: 6.9 %{Hb} — AB (ref <5.7)
Mean Plasma Glucose: 151 (calc)
eAG (mmol/L): 8.4 (calc)

## 2018-11-12 LAB — LIPID PANEL
CHOL/HDL RATIO: 4.8 (calc) (ref <5.0)
CHOLESTEROL: 111 mg/dL (ref <200)
HDL: 23 mg/dL — AB (ref >=40)
LDL CHOLESTEROL (CALC): 69 mg/dL
Non-HDL Cholesterol (Calc): 88 mg/dL (calc) (ref <130)
TRIGLYCERIDES: 103 mg/dL (ref <150)

## 2018-12-13 ENCOUNTER — Other Ambulatory Visit: Payer: Self-pay | Admitting: Cardiovascular Disease

## 2018-12-19 ENCOUNTER — Other Ambulatory Visit: Payer: Self-pay

## 2018-12-19 ENCOUNTER — Telehealth: Payer: Self-pay | Admitting: Cardiovascular Disease

## 2018-12-19 ENCOUNTER — Telehealth: Payer: Self-pay

## 2018-12-19 DIAGNOSIS — Z1211 Encounter for screening for malignant neoplasm of colon: Secondary | ICD-10-CM

## 2018-12-19 NOTE — Telephone Encounter (Signed)
Dr. Fletcher Anon, Can you please comment on holding plavix?

## 2018-12-19 NOTE — Telephone Encounter (Signed)
   Primary Cardiologist: Kathlyn Sacramento, MD  Chart reviewed as part of pre-operative protocol coverage.   Lawrence Holmes was last seen on 06/28/18 by Ignacia Bayley NP.  Per Dr. Fletcher Anon: OK to hold plavix for 5 days prior to colonoscopy.   I will route this recommendation to the requesting party via Epic fax function and remove from pre-op pool.  Please call with questions.  Ledora Bottcher, PA 12/19/2018, 4:56 PM

## 2018-12-19 NOTE — Telephone Encounter (Signed)
Hold Plavix 5 days before .colonoscopy

## 2018-12-19 NOTE — Telephone Encounter (Signed)
Gastroenterology Pre-Procedure Review  Request Date: 03/21/19 Requesting Physician: Dr. Bonna Gains  PATIENT REVIEW QUESTIONS: The patient responded to the following health history questions as indicated:    1. Are you having any GI issues? No 2. Do you have a personal history of Polyps? No 3. Do you have a family history of Colon Cancer or Polyps?  No 4. Diabetes Mellitus?Yes 5. Joint replacements in the past 12 months?No 6. Major health problems in the past 3 months?No 7. Any artificial heart valves, MVP, or defibrillator?No    MEDICATIONS & ALLERGIES:    Patient reports the following regarding taking any anticoagulation/antiplatelet therapy:   Plavix, Coumadin, Eliquis, Xarelto, Lovenox, Pradaxa, Brilinta, or Effient?Yes Plavix Blood Thinner Request sent to Dr. Fletcher Anon Aspirin?No  Patient confirms/reports the following medications:  Current Outpatient Medications  Medication Sig Dispense Refill  . aspirin 81 MG chewable tablet Chew 1 tablet (81 mg total) by mouth daily. 30 tablet 2  . atorvastatin (LIPITOR) 80 MG tablet TAKE 1 TABLET (80 MG TOTAL) BY MOUTH DAILY AT 6 PM. 30 tablet 0  . Blood Gluc Meter Disp-Strips (BLOOD GLUCOSE METER DISPOSABLE) DEVI Check fingerstick blood sugars once a day; E11.69, LON 99 months 100 each 1  . carvedilol (COREG) 3.125 MG tablet TAKE 1 TABLET (3.125 MG TOTAL) BY MOUTH 2 (TWO) TIMES DAILY WITH A MEAL. 180 tablet 2  . clopidogrel (PLAVIX) 75 MG tablet TAKE 1 TABLET (75 MG TOTAL) BY MOUTH DAILY WITH BREAKFAST. (Patient not taking: Reported on 11/11/2018) 90 tablet 3  . ENTRESTO 24-26 MG TAKE 1 TABLET BY MOUTH TWICE A DAY 60 tablet 6  . nitroGLYCERIN (NITROSTAT) 0.4 MG SL tablet Place 1 tablet (0.4 mg total) under the tongue every 5 (five) minutes as needed for chest pain. 30 tablet 2  . spironolactone (ALDACTONE) 25 MG tablet Take 1 tablet (25 mg total) by mouth daily. 90 tablet 3  . STEGLATRO 5 MG TABS TAKE 1 TABLET BY MOUTH EVERY DAY 90 tablet 1   No  current facility-administered medications for this visit.     Patient confirms/reports the following allergies:  No Known Allergies  No orders of the defined types were placed in this encounter.   AUTHORIZATION INFORMATION Primary Insurance: 1D#: Group #:  Secondary Insurance: 1D#: Group #:  SCHEDULE INFORMATION: Date: 03/21/19 Time: Location:[facility]

## 2018-12-19 NOTE — Telephone Encounter (Signed)
° °  [facility] Pre-operative Risk Assessment    Request for surgical clearance:  1. What type of surgery is being performed? Colonoscopy   2. When is this surgery scheduled? 03-21-19  3. What type of clearance is required (medical clearance vs. Pharmacy clearance to hold med vs. Both)?  Pharmacy   4. Are there any medications that need to be held prior to surgery and how long? Plavix please advise   5. Practice name and name of physician performing surgery? [facility]   6. What is your office phone number [phone]   7.   What is your office fax number [phone]  8.   Anesthesia type (None, local, MAC, general) ?not noted    Clarisse Gouge 12/19/2018, 3:27 PM  _________________________________________________________________   (provider comments below)

## 2018-12-23 ENCOUNTER — Telehealth: Payer: Self-pay | Admitting: Gastroenterology

## 2018-12-23 NOTE — Telephone Encounter (Signed)
Received fax from [facility] (Dr Kathlyn Sacramento) states ok to hold plavix for 5 days prior to colonoscopy .

## 2018-12-23 NOTE — Telephone Encounter (Signed)
Patient has been advised to hold Plavix 5 days prior to his colonoscopy date.  Will send him a letter to remind him of this.  Thanks [employer]

## 2019-01-08 ENCOUNTER — Other Ambulatory Visit: Payer: Self-pay | Admitting: Cardiovascular Disease

## 2019-01-22 ENCOUNTER — Telehealth: Payer: Self-pay

## 2019-01-22 NOTE — Telephone Encounter (Signed)
Virtual Visit Pre-Appointment Phone Call  "Lawrence Holmes, I am calling you today to discuss your upcoming appointment. We are currently trying to limit exposure to the virus that causes COVID-19 by seeing patients at home rather than in the office."  1. "What is the BEST phone number to call the day of the visit?" - include this in appointment notes  2. "Do you have or have access to (through a family member/friend) a smartphone with video capability that we can use for your visit?" a. If yes - list this number in appt notes as "cell" (if different from BEST phone #) and list the appointment type as a VIDEO visit in appointment notes b. If no - list the appointment type as a PHONE visit in appointment notes  3. Confirm consent - "In the setting of the current Covid19 crisis, you are scheduled for a video visit with your provider on 01/23/2019 at 3:00PM.  Just as we do with many in-office visits, in order for you to participate in this visit, we must obtain consent.  If you'd like, I can send this to your mychart (if signed up) or email for you to review.  Otherwise, I can obtain your verbal consent now.  All virtual visits are billed to your insurance company just like a normal visit would be.  By agreeing to a virtual visit, we'd like you to understand that the technology does not allow for your provider to perform an examination, and thus may limit your provider's ability to fully assess your condition. If your provider identifies any concerns that need to be evaluated in person, we will make arrangements to do so.  Finally, though the technology is pretty good, we cannot assure that it will always work on either your or our end, and in the setting of a video visit, we may have to convert it to a phone-only visit.  In either situation, we cannot ensure that we have a secure connection.  Are you willing to proceed?" STAFF: Did the patient verbally acknowledge consent to telehealth visit? Document YES/NO here:  YES  4. Advise patient to be prepared - "Two hours prior to your appointment, go ahead and check your blood pressure, pulse, oxygen saturation, and your weight (if you have the equipment to check those) and write them all down. When your visit starts, your provider will ask you for this information. If you have an Apple Watch or Kardia device, please plan to have heart rate information ready on the day of your appointment. Please have a pen and paper handy nearby the day of the visit as well."  5. Give patient instructions for MyChart download to smartphone OR Doximity/Doxy.me as below if video visit (depending on what platform provider is using)  6. Inform patient they will receive a phone call 15 minutes prior to their appointment time (may be from unknown caller ID) so they should be prepared to answer    TELEPHONE CALL NOTE  Lawrence Holmes has been deemed a candidate for a follow-up tele-health visit to limit community exposure during the Covid-19 pandemic. I spoke with the patient via phone to ensure availability of phone/video source, confirm preferred email & phone number, and discuss instructions and expectations.  I reminded Lawrence Holmes to be prepared with any vital sign and/or heart rhythm information that could potentially be obtained via home monitoring, at the time of his visit. I reminded Lawrence Holmes to expect a phone call prior to his visit.  Rene Paci McClain 01/22/2019 11:29 AM   INSTRUCTIONS FOR DOWNLOADING THE MYCHART APP TO SMARTPHONE  - The patient must first make sure to have activated MyChart and know their login information - If Apple, go to [employer] and type in MyChart in the search bar and download the app. If Android, ask patient to go to [employer] and type in [place] in the search bar and download the app. The app is free but as with any other app downloads, their phone may require them to verify saved payment information or Apple/Android password.   - The patient will need to then log into the app with their MyChart username and password, and select [facility] as their healthcare provider to link the account. When it is time for your visit, go to the MyChart app, find appointments, and click Begin Video Visit. Be sure to Select Allow for your device to access the Microphone and Camera for your visit. You will then be connected, and your provider will be with you shortly.  **If they have any issues connecting, or need assistance please contact MyChart service desk [phone])**  **If using a computer, in order to ensure the best quality for their visit they will need to use either of the following Internet Browsers: [employer], or Google Chrome**  IF USING DOXIMITY or DOXY.ME - The patient will receive a link just prior to their visit by text.     FULL LENGTH CONSENT FOR TELE-HEALTH VISIT   I hereby voluntarily request, consent and authorize [facility] and its employed or contracted physicians, physician assistants, nurse practitioners or other licensed health care professionals (the Practitioner), to provide me with telemedicine health care services (the "Services") as deemed necessary by the treating Practitioner. I acknowledge and consent to receive the Services by the Practitioner via telemedicine. I understand that the telemedicine visit will involve communicating with the Practitioner through live audiovisual communication technology and the disclosure of certain medical information by electronic transmission. I acknowledge that I have been given the opportunity to request an in-person assessment or other available alternative prior to the telemedicine visit and am voluntarily participating in the telemedicine visit.  I understand that I have the right to withhold or withdraw my consent to the use of telemedicine in the course of my care at any time, without affecting my right to future care or treatment, and that  the Practitioner or I may terminate the telemedicine visit at any time. I understand that I have the right to inspect all information obtained and/or recorded in the course of the telemedicine visit and may receive copies of available information for a reasonable fee.  I understand that some of the potential risks of receiving the Services via telemedicine include:  [name] Delay or interruption in medical evaluation due to technological equipment failure or disruption; . Information transmitted may not be sufficient (e.g. poor resolution of images) to allow for appropriate medical decision making by the Practitioner; and/or  . In rare instances, security protocols could fail, causing a breach of personal health information.  Furthermore, I acknowledge that it is my responsibility to provide information about my medical history, conditions and care that is complete and accurate to the best of my ability. I acknowledge that Practitioner's advice, recommendations, and/or decision may be based on factors not within their control, such as incomplete or inaccurate data provided by me or distortions of diagnostic images or specimens that may result from electronic transmissions. I understand that  the practice of medicine is not an exact science and that Practitioner makes no warranties or guarantees regarding treatment outcomes. I acknowledge that I will receive a copy of this consent concurrently upon execution via email to the email address I last provided but may also request a printed copy by calling the office of [facility].    I understand that my insurance will be billed for this visit.   I have read or had this consent read to me. . I understand the contents of this consent, which adequately explains the benefits and risks of the Services being provided via telemedicine.  . I have been provided ample opportunity to ask questions regarding this consent and the Services and have had my questions answered to  my satisfaction. . I give my informed consent for the services to be provided through the use of telemedicine in my medical care  By participating in this telemedicine visit I agree to the above.

## 2019-01-23 ENCOUNTER — Other Ambulatory Visit: Payer: Self-pay

## 2019-01-23 ENCOUNTER — Telehealth (INDEPENDENT_AMBULATORY_CARE_PROVIDER_SITE_OTHER): Payer: 59 | Admitting: Nurse Practitioner

## 2019-01-23 VITALS — Ht 72.0 in | Wt 220.0 lb

## 2019-01-23 DIAGNOSIS — I10 Essential (primary) hypertension: Secondary | ICD-10-CM | POA: Diagnosis not present

## 2019-01-23 DIAGNOSIS — I255 Ischemic cardiomyopathy: Secondary | ICD-10-CM | POA: Diagnosis not present

## 2019-01-23 DIAGNOSIS — E785 Hyperlipidemia, unspecified: Secondary | ICD-10-CM | POA: Diagnosis not present

## 2019-01-23 DIAGNOSIS — I2510 Atherosclerotic heart disease of native coronary artery without angina pectoris: Secondary | ICD-10-CM | POA: Diagnosis not present

## 2019-01-23 DIAGNOSIS — I5022 Chronic systolic (congestive) heart failure: Secondary | ICD-10-CM

## 2019-01-23 NOTE — Patient Instructions (Signed)
It was a pleasure to speak with you on the phone today! Thank you for allowing [place] to continue taking care of your [facility] needs during this time.   Feel free to call as needed for questions and concerns related to your cardiac needs.   Medication Instructions:  Your physician recommends that you continue on your current medications as directed. Please refer to the Current Medication list given to you today.  If you need a refill on your cardiac medications before your next appointment, please call your pharmacy.   Lab work: None ordered  If you have labs (blood work) drawn today and your tests are completely normal, you will receive your results only by: [name] MyChart Message (if you have MyChart) OR . A paper copy in the mail If you have any lab test that is abnormal or we need to change your treatment, we will call you to review the results.  Testing/Procedures: None ordered   Follow-Up: At [facility], you and your health needs are our priority.  As part of our continuing mission to provide you with exceptional heart care, we have created designated Provider Care Teams.  These Care Teams include your primary Cardiologist (physician) and Advanced Practice Providers (APPs -  Physician Assistants and Nurse Practitioners) who all work together to provide you with the care you need, when you need it. You will need a follow up virtual appointment in 6 months.  Please call our office 2 months in advance to schedule this appointment.  You may see Kathlyn Sacramento, MD or Murray Hodgkins, NP.

## 2019-01-23 NOTE — Progress Notes (Signed)
Virtual Visit via Video Note   This visit type was conducted due to national recommendations for restrictions regarding the COVID-19 Pandemic (e.g. social distancing) in an effort to limit this patient's exposure and mitigate transmission in our community.  Due to his co-morbid illnesses, this patient is at least at moderate risk for complications without adequate follow up.  This format is felt to be most appropriate for this patient at this time.  All issues noted in this document were discussed and addressed.  A limited physical exam was performed with this format.  Please refer to the patient's chart for his consent to telehealth for [facility]. Evaluation Performed:  Follow-up visit  This visit type was conducted due to national recommendations for restrictions regarding the COVID-19 Pandemic (e.g. social distancing).  This format is felt to be most appropriate for this patient at this time.  All issues noted in this document were discussed and addressed.  No physical exam was performed (except for noted visual exam findings with Video Visits).  Please refer to the patient's chart (MyChart message for video visits and phone note for telephone visits) for the patient's consent to telehealth for [facility]. _____________   Date:  01/23/2019   Patient ID:  Lawrence Holmes, DOB July 17, 1956, MRN [id number] Patient Location:  In his truck in a parking lot Provider location:   Office  Primary Care Provider:  Arnetha Courser, MD Primary Cardiologist:  Kathlyn Sacramento, MD  Chief Complaint    63 year old male with a history of late presenting STEMI/CAD in December 2018 (medically managed), ischemic cardiomyopathy/HFrEF (EF 25 to 35%-improved to 40 to 45%), type 2 diabetes mellitus, and tobacco abuse, who presents for follow-up related to heart failure.  Past Medical History    Past Medical History:  Diagnosis Date   Chronic systolic heart failure ([place]) 07/2017   a. 07/2017 Echo: EF  30-35%, Gr1 DD, inflat, and inf AK, mild to mod MR, mildly dil LA/RA; b. 10/2017 Echo: EF 40-45%, basal-midinf and infsept AK. Mild LVH. Mild MR. Nl RV fxn.   Coronary artery disease 07/2017   a. 07/2017 Late presenting inferior STEMI/Cath: LM nl, LAD min irregs, LCX 100d (L->L collats), RCA 100p (L->R collats), EF 25-35%-->Med Rx; b. 10/2017 ETT (DOT): Ex time 7:38, baseline antlat TWI, no acute changes.    Diabetes mellitus without complication ([place])    Hyperlipidemia    Ischemic cardiomyopathy    a. 07/2017 Echo: EF 30-35%; b. 10/2017 Echo: EF 40-45%.   Tobacco use    Past Surgical History:  Procedure Laterality Date   EYE SURGERY     LEFT HEART CATH AND CORONARY ANGIOGRAPHY N/A 07/21/2017   Procedure: LEFT HEART CATH AND CORONARY ANGIOGRAPHY;  Surgeon: Wellington Hampshire, MD;  Location: [facility];  Service: Cardiovascular;  Laterality: N/A;    Allergies  No Known Allergies  History of Present Illness    Lawrence Holmes is a 63 y.o. male who presents via Engineer, civil (consulting) for a telehealth visit today.  As above, he has a history of CAD status post late presenting inferior STEMI December 2018.  Catheterization revealed a total occlusion of the distal left circumflex and proximal RCA.  EF was 25 to 30% by ventriculography and subsequently 30 to 35% by echo.  The culprit was felt to be the right coronary artery and the interventional team was not able to cross this with a wire.  He was subsequently medically managed.  EF improved to 40 to  45% by echo in March 2019.  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  He was last seen in clinic in November 2019.  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  He has remained relatively active, walking regularly and without chest pain or dyspnea.  He has been compliant with medications and denies palpitations,  dizziness, syncope, edema, or early satiety.  He packs his own lunch and is sure to avoid fast food restaurants and says he avoids salt overall.  As he is mostly in his truck during the day, he has not been in the large crowds.  He occasionally does have to wear a mask when outside of his truck.  The patient does not have symptoms concerning for COVID-19 infection (fever, chills, cough, or new shortness of breath).   Home Medications    Prior to Admission medications   Medication Sig Start Date End Date Taking? Authorizing Provider  aspirin 81 MG chewable tablet Chew 1 tablet (81 mg total) by mouth daily. 07/24/17  Yes Demetrios Loll, MD  atorvastatin (LIPITOR) 80 MG tablet TAKE 1 TABLET (80 MG TOTAL) BY MOUTH DAILY AT 6 PM. *NEEDS APPT FOR FURTHER FILLS* 01/08/19  Yes Wellington Hampshire, MD  Blood Gluc Meter Disp-Strips (BLOOD GLUCOSE METER DISPOSABLE) DEVI Check fingerstick blood sugars once a day; E11.69, LON 99 months 05/13/18  Yes Lada, Satira Anis, MD  carvedilol (COREG) 3.125 MG tablet TAKE 1 TABLET (3.125 MG TOTAL) BY MOUTH 2 (TWO) TIMES DAILY WITH A MEAL. 06/13/18  Yes Wellington Hampshire, MD  clopidogrel (PLAVIX) 75 MG tablet TAKE 1 TABLET (75 MG TOTAL) BY MOUTH DAILY WITH BREAKFAST. 06/14/18  Yes Wellington Hampshire, MD  ENTRESTO 24-26 MG TAKE 1 TABLET BY MOUTH TWICE A DAY 08/12/18  Yes Wellington Hampshire, MD  nitroGLYCERIN (NITROSTAT) 0.4 MG SL tablet Place 1 tablet (0.4 mg total) under the tongue every 5 (five) minutes as needed for chest pain. 07/23/17  Yes Demetrios Loll, MD  spironolactone (ALDACTONE) 25 MG tablet Take 1 tablet (25 mg total) by mouth daily. 05/29/18 01/23/19 Yes Wellington Hampshire, MD  STEGLATRO 5 MG TABS TAKE 1 TABLET BY MOUTH EVERY DAY 11/12/18  Yes Hubbard Hartshorn, FNP    Review of Systems    He has been doing well over the past 7 months.  He denies chest pain, palpitations, dyspnea, pnd, orthopnea, n, v, dizziness, syncope, edema, weight gain, or early satiety.  All other systems  reviewed and are otherwise negative except as noted above.  Physical Exam    Vital Signs:  Ht 6' (1.829 m)    Wt 220 lb (99.8 kg)    BMI 29.84 kg/m  He knows the blood pressures typically run 120-130.  Well nourished, well developed male in no acute distress. Awake, alert, and oriented x3.  No acute distress.  Respirations regular unlabored.  Accessory Clinical Findings    Lab Results  Component Value Date   CREATININE 1.10 11/11/2018   BUN 14 11/11/2018   NA 140 11/11/2018   K 5.1 11/11/2018   CL 106 11/11/2018   CO2 26 11/11/2018    Lab Results  Component Value Date   WBC 10.4 07/23/2017   HGB 15.0 07/23/2017   HCT 43.8 07/23/2017   MCV 92.2 07/23/2017   PLT 282 07/23/2017   Lab Results  Component Value Date   ALT  50 (H) 11/11/2018   AST 20 11/11/2018   ALKPHOS 63 09/12/2017   BILITOT 0.6 11/11/2018   Lab Results  Component Value Date   CHOL 111 11/11/2018   HDL 23 (L) 11/11/2018   LDLCALC 69 11/11/2018   TRIG 103 11/11/2018   CHOLHDL 4.8 11/11/2018    Lab Results  Component Value Date   HGBA1C 6.9 (H) 11/11/2018    Assessment & Plan    1.  Coronary artery disease: Status post late presenting inferior STEMI in December 2018 with diagnostic catheterization at that time revealing an occluded distal left circumflex as well as an occluded proximal RCA.  The RCA was felt to be the culprit however, the interventional team was unable to wire the vessel and therefore he has been medically managed.  Exercise treadmill testing in March 2019 was without acute ST or T changes.  He has done well since his last office visit in November 2019 and remains relatively active without any symptoms or limitations.  He remains on aspirin, statin, beta-blocker, and Plavix therapy.  2.  HFrEF/ischemic cardiomyopathy: EF 30 to 35% at the time of his myocardial infarction with subsequent provement to 40-45% by echo in March 2019.  His weight has been stable and he has not been having any  issues with volume/edema.  He has been compliant with beta-blocker, Entresto, and Spironolactone therapy.  He had labs in March which showed stable renal function and a high normal potassium of 5.1.  We discussed the importance of daily weights, sodium restriction, medication compliance, and symptom reporting and he verbalizes understanding.  3.  Essential hypertension: He notes that blood pressures typically run 120-130.  Continue current regimen.  4.  Hyperlipidemia: LDL was 69 in March with only mild ALT elevation.  He remains on statin therapy.  5.  Type 2 diabetes mellitus: On SGLT2 inhibitor.  A1c was up slightly in March to 6.9.  He was 5.9 last September.  This is being followed by primary care.  6.  Disposition: Follow-up in 6 months with Dr. Fletcher Anon.  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  COVID-19 Education: The signs and symptoms of COVID-19 were discussed with the patient and how to seek care for testing (follow up with PCP or arrange E-visit).  The importance of social distancing was discussed today.  Patient Risk:   After full review of this patient's history and clinical status, I feel that he is at least moderate risk for cardiac complications at this time, thus necessitating a telehealth visit sooner than our first available in office visit.  Time:   Today, I have spent 11 minutes with the patient with telehealth technology discussing medical history, symptoms, and management plan.     Murray Hodgkins, NP 01/23/2019, 2:57 PM

## 2019-02-11 ENCOUNTER — Other Ambulatory Visit: Payer: Self-pay | Admitting: *Deleted

## 2019-02-11 MED ORDER — CARVEDILOL 3.125 MG PO TABS: 3.1250 mg | ORAL_TABLET | Freq: Two times a day (BID) | ORAL | 3 refills | Status: DC

## 2019-02-12 ENCOUNTER — Other Ambulatory Visit: Payer: Self-pay | Admitting: *Deleted

## 2019-02-12 MED ORDER — CARVEDILOL 3.125 MG PO TABS: 3.1250 mg | ORAL_TABLET | Freq: Two times a day (BID) | ORAL | 2 refills | Status: DC

## 2019-02-20 ENCOUNTER — Other Ambulatory Visit: Payer: Self-pay | Admitting: Cardiovascular Disease

## 2019-03-17 ENCOUNTER — Other Ambulatory Visit: Payer: Self-pay | Admitting: *Deleted

## 2019-03-17 MED ORDER — ENTRESTO 24-26 MG PO TABS: 1.0000 | ORAL_TABLET | Freq: Two times a day (BID) | ORAL | 2 refills | Status: DC

## 2019-03-18 ENCOUNTER — Encounter
Admission: RE | Admit: 2019-03-18 | Discharge: 2019-03-18 | Disposition: A | Discharge status: Home / Self Care | Payer: 59 | Source: Ambulatory Visit | Attending: Gastroenterology | Admitting: Gastroenterology

## 2019-03-20 ENCOUNTER — Telehealth: Payer: Self-pay

## 2019-03-20 NOTE — Telephone Encounter (Signed)
Patient has been contacted to reschedule tomorrows colonoscopy due to not having COVID test performed.  Patients procedure has been rescheduled to 04/04/19.  Pt has been advised to have COVID test on Tues 04/01/19, and stop Plavix 5 days before on 03/30/19.  Thanks [employer]

## 2019-03-28 ENCOUNTER — Telehealth: Payer: Self-pay | Admitting: Gastroenterology

## 2019-04-01 ENCOUNTER — Other Ambulatory Visit: Payer: Self-pay

## 2019-04-01 ENCOUNTER — Other Ambulatory Visit
Admission: RE | Admit: 2019-04-01 | Discharge: 2019-04-01 | Disposition: A | Discharge status: Home / Self Care | Payer: 59 | Source: Ambulatory Visit | Attending: Gastroenterology | Admitting: Gastroenterology

## 2019-04-01 DIAGNOSIS — Z20828 Contact with and (suspected) exposure to other viral communicable diseases: Secondary | ICD-10-CM | POA: Insufficient documentation

## 2019-04-01 DIAGNOSIS — Z01812 Encounter for preprocedural laboratory examination: Secondary | ICD-10-CM | POA: Diagnosis not present

## 2019-04-01 LAB — SARS CORONAVIRUS 2 (TAT 6-24 HRS): SARS Coronavirus 2: NEGATIVE

## 2019-04-04 ENCOUNTER — Encounter: Payer: Self-pay | Admitting: *Deleted

## 2019-04-04 ENCOUNTER — Ambulatory Visit: Payer: 59 | Admitting: Registered Nurse

## 2019-04-04 ENCOUNTER — Encounter
Admission: RE | Disposition: A | Discharge status: Home / Self Care | Payer: Self-pay | Source: Home / Self Care | Attending: Gastroenterology

## 2019-04-04 ENCOUNTER — Ambulatory Visit
Admission: RE | Admit: 2019-04-04 | Discharge: 2019-04-04 | Disposition: A | Discharge status: Home / Self Care | Payer: 59 | Attending: Gastroenterology | Admitting: Gastroenterology

## 2019-04-04 ENCOUNTER — Other Ambulatory Visit: Payer: Self-pay

## 2019-04-04 DIAGNOSIS — K648 Other hemorrhoids: Secondary | ICD-10-CM | POA: Diagnosis not present

## 2019-04-04 DIAGNOSIS — I252 Old myocardial infarction: Secondary | ICD-10-CM | POA: Insufficient documentation

## 2019-04-04 DIAGNOSIS — F17210 Nicotine dependence, cigarettes, uncomplicated: Secondary | ICD-10-CM | POA: Insufficient documentation

## 2019-04-04 DIAGNOSIS — K635 Polyp of colon: Secondary | ICD-10-CM | POA: Diagnosis not present

## 2019-04-04 DIAGNOSIS — K5730 Diverticulosis of large intestine without perforation or abscess without bleeding: Secondary | ICD-10-CM | POA: Diagnosis not present

## 2019-04-04 DIAGNOSIS — I255 Ischemic cardiomyopathy: Secondary | ICD-10-CM | POA: Diagnosis not present

## 2019-04-04 DIAGNOSIS — I5022 Chronic systolic (congestive) heart failure: Secondary | ICD-10-CM | POA: Insufficient documentation

## 2019-04-04 DIAGNOSIS — E785 Hyperlipidemia, unspecified: Secondary | ICD-10-CM | POA: Insufficient documentation

## 2019-04-04 DIAGNOSIS — E119 Type 2 diabetes mellitus without complications: Secondary | ICD-10-CM | POA: Diagnosis not present

## 2019-04-04 DIAGNOSIS — Z1211 Encounter for screening for malignant neoplasm of colon: Secondary | ICD-10-CM

## 2019-04-04 DIAGNOSIS — Z79899 Other long term (current) drug therapy: Secondary | ICD-10-CM | POA: Insufficient documentation

## 2019-04-04 DIAGNOSIS — D123 Benign neoplasm of transverse colon: Secondary | ICD-10-CM | POA: Diagnosis not present

## 2019-04-04 DIAGNOSIS — D125 Benign neoplasm of sigmoid colon: Secondary | ICD-10-CM | POA: Insufficient documentation

## 2019-04-04 DIAGNOSIS — Z7982 Long term (current) use of aspirin: Secondary | ICD-10-CM | POA: Insufficient documentation

## 2019-04-04 DIAGNOSIS — I2510 Atherosclerotic heart disease of native coronary artery without angina pectoris: Secondary | ICD-10-CM | POA: Insufficient documentation

## 2019-04-04 DIAGNOSIS — Z7902 Long term (current) use of antithrombotics/antiplatelets: Secondary | ICD-10-CM | POA: Diagnosis not present

## 2019-04-04 HISTORY — PX: COLONOSCOPY WITH PROPOFOL: SHX5780

## 2019-04-04 LAB — GLUCOSE, CAPILLARY: Glucose-Capillary: 102 mg/dL — ABNORMAL HIGH (ref 70–99)

## 2019-04-04 SURGERY — COLONOSCOPY WITH PROPOFOL
Anesthesia: General

## 2019-04-04 MED ORDER — FLUMAZENIL 0.5 MG/5ML IV SOLN: 0.2000 mg | Freq: Once | INTRAVENOUS | Status: DC

## 2019-04-04 MED ORDER — PROPOFOL 500 MG/50ML IV EMUL
INTRAVENOUS | Status: AC
  Filled 2019-04-04: qty 50

## 2019-04-04 MED ORDER — MIDAZOLAM HCL 2 MG/2ML IJ SOLN
INTRAMUSCULAR | Status: DC | PRN
  Administered 2019-04-04: 2 mg via INTRAVENOUS

## 2019-04-04 MED ORDER — SUCCINYLCHOLINE CHLORIDE 20 MG/ML IJ SOLN
INTRAMUSCULAR | Status: AC
  Filled 2019-04-04: qty 1

## 2019-04-04 MED ORDER — FLUMAZENIL 0.5 MG/5ML IV SOLN
INTRAVENOUS | Status: AC
  Administered 2019-04-04: 0.2 mg
  Filled 2019-04-04: qty 5

## 2019-04-04 MED ORDER — SODIUM CHLORIDE (PF) 0.9 % IJ SOLN
INTRAMUSCULAR | Status: AC
  Filled 2019-04-04: qty 10

## 2019-04-04 MED ORDER — LIDOCAINE HCL (CARDIAC) PF 100 MG/5ML IV SOSY
PREFILLED_SYRINGE | INTRAVENOUS | Status: DC | PRN
  Administered 2019-04-04: 40 mg via INTRAVENOUS

## 2019-04-04 MED ORDER — PROPOFOL 10 MG/ML IV BOLUS
INTRAVENOUS | Status: AC
  Filled 2019-04-04: qty 20

## 2019-04-04 MED ORDER — LIDOCAINE HCL (PF) 2 % IJ SOLN
INTRAMUSCULAR | Status: AC
  Filled 2019-04-04: qty 10

## 2019-04-04 MED ORDER — PROPOFOL 10 MG/ML IV BOLUS
INTRAVENOUS | Status: DC | PRN
  Administered 2019-04-04: 100 mg via INTRAVENOUS

## 2019-04-04 MED ORDER — PROPOFOL 500 MG/50ML IV EMUL
INTRAVENOUS | Status: DC | PRN
  Administered 2019-04-04: 150 ug/kg/min via INTRAVENOUS

## 2019-04-04 MED ORDER — MIDAZOLAM HCL 2 MG/2ML IJ SOLN
INTRAMUSCULAR | Status: AC
  Filled 2019-04-04: qty 2

## 2019-04-04 MED ORDER — PHENYLEPHRINE HCL (PRESSORS) 10 MG/ML IV SOLN
INTRAVENOUS | Status: DC | PRN
  Administered 2019-04-04: 100 ug via INTRAVENOUS
  Administered 2019-04-04: 200 ug via INTRAVENOUS
  Administered 2019-04-04: 50 ug via INTRAVENOUS
  Administered 2019-04-04: 200 ug via INTRAVENOUS
  Administered 2019-04-04: 50 ug via INTRAVENOUS
  Administered 2019-04-04: 200 ug via INTRAVENOUS

## 2019-04-04 MED ORDER — SODIUM CHLORIDE 0.9 % IV SOLN
INTRAVENOUS | Status: DC
  Administered 2019-04-04: 08:00:00 via INTRAVENOUS

## 2019-04-04 NOTE — Transfer of Care (Signed)
Immediate Anesthesia Transfer of Care Note  Patient: Lawrence Holmes [place]  Procedure(s) Performed: Procedure(s): COLONOSCOPY WITH PROPOFOL (N/A)  Patient Location: PACU and Endoscopy Unit  Anesthesia Type:General  Level of Consciousness: sedated  Airway & Oxygen Therapy: Patient Spontanous Breathing and Patient connected to nasal cannula oxygen  Post-op Assessment: Report given to RN and Post -op Vital signs reviewed and stable  Post vital signs: Reviewed and stable  Last Vitals:  Vitals:   04/04/19 0902 04/04/19 0903  BP: (!) 81/47 (!) 81/47  Pulse: 62 62  Resp: 15 16  Temp:    SpO2: 28% 41%    Complications: No apparent anesthesia complications

## 2019-04-04 NOTE — Anesthesia Procedure Notes (Signed)
Date/Time: 04/04/2019 8:16 AM Performed by: Doreen Salvage, CRNA Pre-anesthesia Checklist: Patient identified, Emergency Drugs available, Suction available and Patient being monitored Patient Re-evaluated:Patient Re-evaluated prior to induction Oxygen Delivery Method: Nasal cannula Induction Type: IV induction Dental Injury: Teeth and Oropharynx as per pre-operative assessment  Comments: Nasal cannula with etCO2 monitoring

## 2019-04-04 NOTE — Anesthesia Post-op Follow-up Note (Signed)
Anesthesia QCDR form completed.        

## 2019-04-04 NOTE — Op Note (Signed)
[facility] Gastroenterology Patient Name: Lawrence Holmes Procedure Date: 04/04/2019 7:25 AM MRN: [id number] Account #: [id number] Date of Birth: 05/26/1956 Admit Type: Outpatient Age: 63 Room: [facility] ENDO ROOM 1 Gender: Male Note Status: Finalized Procedure:            Colonoscopy Indications:          Screening for colorectal malignant neoplasm Providers:            Nylee Barbuto B. Bonna Gains MD, MD Referring MD:         Arnetha Courser (Referring MD) Medicines:            Monitored Anesthesia Care Complications:        No immediate complications. Procedure:            Pre-Anesthesia Assessment:                       - ASA Grade Assessment: II - A patient with mild                        systemic disease.                       - Prior to the procedure, a History and Physical was                        performed, and patient medications, allergies and                        sensitivities were reviewed. The patient's tolerance of                        previous anesthesia was reviewed.                       - The risks and benefits of the procedure and the                        sedation options and risks were discussed with the                        patient. All questions were answered and informed                        consent was obtained.                       - Patient identification and proposed procedure were                        verified prior to the procedure by the physician, the                        nurse, the anesthesiologist, the anesthetist and the                        technician. The procedure was verified in the procedure                        room.  After obtaining informed consent, the colonoscope was                        passed under direct vision. Throughout the procedure,                        the patient's blood pressure, pulse, and oxygen                        saturations were monitored continuously. The                         Colonoscope was introduced through the anus and                        advanced to the the cecum, identified by appendiceal                        orifice and ileocecal valve. The colonoscopy was                        performed with ease. The patient tolerated the                        procedure well. The quality of the bowel preparation                        was good. Findings:      The perianal and digital rectal examinations were normal.      A 4 mm polyp was found in the transverse colon. The polyp was sessile.       The polyp was removed with a jumbo cold forceps. Resection and retrieval       were complete.      Five sessile polyps were found in the sigmoid colon. The polyps were 3       to 4 mm in size. These polyps were removed with a jumbo cold forceps.       Resection and retrieval were complete.      A few diverticula were found in the sigmoid colon.      The exam was otherwise without abnormality.      The rectum, sigmoid colon, descending colon, transverse colon, ascending       colon and cecum appeared normal.      Non-bleeding internal hemorrhoids were found during retroflexion. Impression:           - One 4 mm polyp in the transverse colon, removed with                        a jumbo cold forceps. Resected and retrieved.                       - Five 3 to 4 mm polyps in the sigmoid colon, removed                        with a jumbo cold forceps. Resected and retrieved.                       - Diverticulosis in the sigmoid colon.                       -  The examination was otherwise normal.                       - The rectum, sigmoid colon, descending colon,                        transverse colon, ascending colon and cecum are normal.                       - Non-bleeding internal hemorrhoids. Recommendation:       - Discharge patient to home (with escort).                       - Advance diet as tolerated.                       - Continue present  medications.                       - Await pathology results.                       - Repeat colonoscopy date to be determined after                        pending pathology results are reviewed.                       - The findings and recommendations were discussed with                        the patient.                       - The findings and recommendations were discussed with                        the patient's family.                       - Return to primary care physician as previously                        scheduled.                       - High fiber diet. Procedure Code(s):    --- Professional ---                       [phone], Colonoscopy, flexible; with biopsy, single or                        multiple Diagnosis Code(s):    --- Professional ---                       K63.5, Polyp of colon                       Z12.11, Encounter for screening for malignant neoplasm                        of colon  K57.30, Diverticulosis of large intestine without                        perforation or abscess without bleeding CPT copyright 2019 American Medical Association. All rights reserved. The codes documented in this report are preliminary and upon coder review may  be revised to meet current compliance requirements.  Vonda Antigua, MD Margretta Sidle B. Bonna Gains MD, MD 04/04/2019 9:06:43 AM This report has been signed electronically. Number of Addenda: 0 Note Initiated On: 04/04/2019 7:25 AM Scope Withdrawal Time: 0 hours 30 minutes 8 seconds  Total Procedure Duration: 0 hours 36 minutes 3 seconds  Estimated Blood Loss: Estimated blood loss: none.      [facility]

## 2019-04-04 NOTE — Anesthesia Preprocedure Evaluation (Signed)
Anesthesia Evaluation  Patient identified by MRN, date of birth, ID band Patient awake    Reviewed: Allergy & Precautions, H&P , NPO status , Patient's Chart, lab work & pertinent test results, reviewed documented beta blocker date and time   Airway Mallampati: II   Neck ROM: full    Dental  (+) Poor Dentition   Pulmonary neg pulmonary ROS, Current SmokerPatient did not abstain from smoking.,    Pulmonary exam normal        Cardiovascular Exercise Tolerance: Good + CAD and +CHF  (-) Orthopnea and (-) PND Normal cardiovascular exam Rhythm:regular Rate:Normal     Neuro/Psych negative neurological ROS  negative psych ROS   GI/Hepatic negative GI ROS, Neg liver ROS,   Endo/Other  negative endocrine ROSdiabetes  Renal/GU negative Renal ROS  negative genitourinary   Musculoskeletal   Abdominal   Peds  Hematology negative hematology ROS (+)   Anesthesia Other Findings Past Medical History: 44/0102: Chronic systolic heart failure ([place])     Comment:  a. 07/2017 Echo: EF 30-35%, Gr1 DD, inflat, and inf AK,               mild to mod MR, mildly dil LA/RA; b. 10/2017 Echo: EF               40-45%, basal-midinf and infsept AK. Mild LVH. Mild MR.               Nl RV fxn. 07/2017: Coronary artery disease     Comment:  a. 07/2017 Late presenting inferior STEMI/Cath: LM nl,               LAD min irregs, LCX 100d (L->L collats), RCA 100p (L->R               collats), EF 25-35%-->Med Rx; b. 10/2017 ETT (DOT): Ex               time 7:38, baseline antlat TWI, no acute changes.  No date: Diabetes mellitus without complication (HCC) No date: Hyperlipidemia No date: Ischemic cardiomyopathy     Comment:  a. 07/2017 Echo: EF 30-35%; b. 10/2017 Echo: EF 40-45%. No date: Tobacco use Past Surgical History: No date: EYE SURGERY 07/21/2017: LEFT HEART CATH AND CORONARY ANGIOGRAPHY; N/A     Comment:  Procedure: LEFT HEART CATH AND CORONARY  ANGIOGRAPHY;                Surgeon: Wellington Hampshire, MD;  Location: [place]               CV LAB;  Service: Cardiovascular;  Laterality: N/A; BMI    Body Mass Index: 28.48 kg/m     Reproductive/Obstetrics negative OB ROS                             Anesthesia Physical Anesthesia Plan  ASA: III  Anesthesia Plan: General   Post-op Pain Management:    Induction:   PONV Risk Score and Plan:   Airway Management Planned:   Additional Equipment:   Intra-op Plan:   Post-operative Plan:   Informed Consent: I have reviewed the patients History and Physical, chart, labs and discussed the procedure including the risks, benefits and alternatives for the proposed anesthesia with the patient or authorized representative who has indicated his/her understanding and acceptance.     Dental Advisory Given  Plan Discussed with: CRNA  Anesthesia Plan Comments:  Anesthesia Quick Evaluation  

## 2019-04-04 NOTE — H&P (Signed)
Vonda Antigua, MD [address] [place], [place], [place], [address] [address], [place], [place], [place], [address] Phone: [phone]  Fax: [phone]  Primary Care Physician:  Arnetha Courser, MD   Pre-Procedure History & Physical: HPI:  Lawrence Holmes is a 63 y.o. male is here for a colonoscopy.   Past Medical History:  Diagnosis Date  . Chronic systolic heart failure ([place]) 07/2017   a. 07/2017 Echo: EF 30-35%, Gr1 DD, inflat, and inf AK, mild to mod MR, mildly dil LA/RA; b. 10/2017 Echo: EF 40-45%, basal-midinf and infsept AK. Mild LVH. Mild MR. Nl RV fxn.  . Coronary artery disease 07/2017   a. 07/2017 Late presenting inferior STEMI/Cath: LM nl, LAD min irregs, LCX 100d (L->L collats), RCA 100p (L->R collats), EF 25-35%-->Med Rx; b. 10/2017 ETT (DOT): Ex time 7:38, baseline antlat TWI, no acute changes.   . Diabetes mellitus without complication ([place])   . Hyperlipidemia   . Ischemic cardiomyopathy    a. 07/2017 Echo: EF 30-35%; b. 10/2017 Echo: EF 40-45%.  . Tobacco use     Past Surgical History:  Procedure Laterality Date  . EYE SURGERY    . LEFT HEART CATH AND CORONARY ANGIOGRAPHY N/A 07/21/2017   Procedure: LEFT HEART CATH AND CORONARY ANGIOGRAPHY;  Surgeon: Wellington Hampshire, MD;  Location: [place] CV [facility];  Service: Cardiovascular;  Laterality: N/A;    Prior to Admission medications   Medication Sig Start Date End Date Taking? Authorizing Provider  aspirin 81 MG chewable tablet Chew 1 tablet (81 mg total) by mouth daily. 07/24/17  Yes Demetrios Loll, MD  atorvastatin (LIPITOR) 80 MG tablet TAKE 1 TABLET (80 MG TOTAL) BY MOUTH DAILY AT 6 PM. *NEEDS APPT FOR FURTHER FILLS* 02/20/19  Yes Theora Gianotti, NP  carvedilol (COREG) 3.125 MG tablet Take 1 tablet (3.125 mg total) by mouth 2 (two) times daily with a meal. 02/12/19  Yes Arida, Mertie Clause, MD  sacubitril-valsartan (ENTRESTO) 24-26 MG Take 1 tablet by mouth 2 (two) times daily. 03/17/19  Yes Theora Gianotti, NP  STEGLATRO 5 MG TABS TAKE 1 TABLET BY MOUTH EVERY DAY 11/12/18  Yes Hubbard Hartshorn, FNP  Blood Gluc Meter Disp-Strips (BLOOD GLUCOSE METER DISPOSABLE) DEVI Check fingerstick blood sugars once a day; E11.69, LON 99 months 05/13/18   Lada, Satira Anis, MD  clopidogrel (PLAVIX) 75 MG tablet TAKE 1 TABLET (75 MG TOTAL) BY MOUTH DAILY WITH BREAKFAST. 06/14/18   Wellington Hampshire, MD  nitroGLYCERIN (NITROSTAT) 0.4 MG SL tablet Place 1 tablet (0.4 mg total) under the tongue every 5 (five) minutes as needed for chest pain. 07/23/17   Demetrios Loll, MD  spironolactone (ALDACTONE) 25 MG tablet Take 1 tablet (25 mg total) by mouth daily. 05/29/18 01/23/19  Wellington Hampshire, MD    Allergies as of 12/19/2018  . (No Known Allergies)    Family History  Problem Relation Age of Onset  . Heart disease Mother   . Hyperlipidemia Mother   . Hypertension Mother   . Heart attack Sister     Social History   Socioeconomic History  . Marital status: Married    Spouse name: Not on file  . Number of children: Not on file  . Years of education: Not on file  . Highest education level: Not on file  Occupational History  . Not on file  Social Needs  . Financial resource strain: Not on file  . Food insecurity    Worry: Not  on file    Inability: Not on file  . Transportation needs    Medical: Not on file    Non-medical: Not on file  Tobacco Use  . Smoking status: Current Every Day Smoker    Packs/day: 0.50    Years: 25.00    Pack years: 12.50    Types: Cigarettes  . Smokeless tobacco: Never Used  . Tobacco comment: 09/06/17 Tobacco cessation reviewed. Currently smoking about 10 cigs a day.  Substance and Sexual Activity  . Alcohol use: Yes    Comment: rare  . Drug use: No  . Sexual activity: Yes  Lifestyle  . Physical activity    Days per week: Not on file    Minutes per session: Not on file  . Stress: Not on file  Relationships  . Social Herbalist on phone: Not on file     Gets together: Not on file    Attends religious service: Not on file    Active member of club or organization: Not on file    Attends meetings of clubs or organizations: Not on file    Relationship status: Not on file  . Intimate partner violence    Fear of current or ex partner: Not on file    Emotionally abused: Not on file    Physically abused: Not on file    Forced sexual activity: Not on file  Other Topics Concern  . Not on file  Social History Narrative  . Not on file    Review of Systems: See HPI, otherwise negative ROS  Physical Exam: BP 121/78   Pulse 76   Temp (!) 96.9 F (36.1 C) (Tympanic)   Resp 17   Ht 6' (1.829 m)   Wt 95.3 kg   SpO2 98%   BMI 28.48 kg/m  General:   Alert,  pleasant and cooperative in NAD Head:  Normocephalic and atraumatic. Neck:  Supple; no masses or thyromegaly. Lungs:  Clear throughout to auscultation, normal respiratory effort.    Heart:  +S1, +S2, Regular rate and rhythm, No edema. Abdomen:  Soft, nontender and nondistended. Normal bowel sounds, without guarding, and without rebound.   Neurologic:  Alert and  oriented x4;  grossly normal neurologically.  Impression/Plan: Lawrence Holmes is here for a colonoscopy to be performed for average risk screening.  Risks, benefits, limitations, and alternatives regarding  colonoscopy have been reviewed with the patient.  Questions have been answered.  All parties agreeable.   Virgel Manifold, MD  04/04/2019, 8:13 AM

## 2019-04-07 ENCOUNTER — Encounter: Payer: Self-pay | Admitting: Gastroenterology

## 2019-04-07 LAB — SURGICAL PATHOLOGY

## 2019-04-08 ENCOUNTER — Encounter: Payer: Self-pay | Admitting: Gastroenterology

## 2019-04-09 NOTE — Anesthesia Postprocedure Evaluation (Signed)
Anesthesia Post Note  Patient: Jakota D [place]  Procedure(s) Performed: COLONOSCOPY WITH PROPOFOL (N/A )  Patient location during evaluation: PACU Anesthesia Type: General Level of consciousness: awake and alert Pain management: pain level controlled Vital Signs Assessment: post-procedure vital signs reviewed and stable Respiratory status: spontaneous breathing, nonlabored ventilation, respiratory function stable and patient connected to nasal cannula oxygen Cardiovascular status: blood pressure returned to baseline and stable Postop Assessment: no apparent nausea or vomiting Anesthetic complications: no     Last Vitals:  Vitals:   04/04/19 0940 04/04/19 0942  BP:  114/67  Pulse: 73 63  Resp: (!) 22 18  Temp:    SpO2: 95% 92%    Last Pain:  Vitals:   04/04/19 0925  TempSrc: Tympanic  PainSc:                  Molli Barrows

## 2019-04-25 NOTE — Telephone Encounter (Signed)
error 

## 2019-05-17 ENCOUNTER — Other Ambulatory Visit: Payer: Self-pay | Admitting: Family Medicine

## 2019-05-17 DIAGNOSIS — E119 Type 2 diabetes mellitus without complications: Secondary | ICD-10-CM

## 2019-05-20 ENCOUNTER — Other Ambulatory Visit: Payer: Self-pay | Admitting: *Deleted

## 2019-05-20 MED ORDER — SPIRONOLACTONE 25 MG PO TABS: 25.0000 mg | ORAL_TABLET | Freq: Every day | ORAL | 0 refills | Status: DC

## 2019-06-10 ENCOUNTER — Ambulatory Visit (INDEPENDENT_AMBULATORY_CARE_PROVIDER_SITE_OTHER): Payer: 59 | Admitting: Family Medicine

## 2019-06-10 ENCOUNTER — Other Ambulatory Visit: Payer: Self-pay

## 2019-06-10 ENCOUNTER — Encounter: Payer: Self-pay | Admitting: Family Medicine

## 2019-06-10 VITALS — BP 128/72 | HR 80 | Temp 97.9°F | Resp 18 | Ht 72.0 in | Wt 216.7 lb

## 2019-06-10 DIAGNOSIS — I255 Ischemic cardiomyopathy: Secondary | ICD-10-CM

## 2019-06-10 DIAGNOSIS — I5022 Chronic systolic (congestive) heart failure: Secondary | ICD-10-CM

## 2019-06-10 DIAGNOSIS — Z720 Tobacco use: Secondary | ICD-10-CM

## 2019-06-10 DIAGNOSIS — B351 Tinea unguium: Secondary | ICD-10-CM

## 2019-06-10 DIAGNOSIS — I252 Old myocardial infarction: Secondary | ICD-10-CM

## 2019-06-10 DIAGNOSIS — E786 Lipoprotein deficiency: Secondary | ICD-10-CM

## 2019-06-10 DIAGNOSIS — B353 Tinea pedis: Secondary | ICD-10-CM

## 2019-06-10 DIAGNOSIS — E1169 Type 2 diabetes mellitus with other specified complication: Secondary | ICD-10-CM

## 2019-06-10 MED ORDER — STEGLATRO 5 MG PO TABS: 1.0000 | ORAL_TABLET | Freq: Every day | ORAL | 3 refills | Status: DC

## 2019-06-10 NOTE — Patient Instructions (Signed)
Double check to make sure you are taking your Plavix

## 2019-06-10 NOTE — Progress Notes (Signed)
Name: Lawrence Holmes   MRN: [id number]    DOB: 12-14-1955   Date:06/10/2019       Progress Note  Subjective  Chief Complaint  Chief Complaint  Patient presents with  . Hyperlipidemia    3 month follow up, medication refill  . Diabetes    HPI  Congestive heart failure & Histroy of STEMI Sees cardiologist Dr. Fletcher Anon. Rx. lipitor 80mg , coreg, entresto, and spironolactone; takes 81mg  aspirin and plavix daily. States he is taking these as prescribed. He denies chest pain, palpitations, PND, orthopnea, dyspnea, dizziness, syncope, edema.  No signs or symptoms of bleeding.   Diabetes Mellitus rx steglatro 5mg ; denies polyphagia, polydipsia, or polyuria.  Doing well on medication though has been out for a few days.  He states diet is so-so overall but does avoid sugar/carbs, has maybe 2 sodas a week. Urine Micro is UTD.  Taking ARB and Statin therapy.  Was seeing the podiatrist for toenail management, but stopped and has been cutting them himself. Lab Results  Component Value Date   HGBA1C 6.9 (H) 11/11/2018   Tobacco use Smokes 0.5-1ppd, does not chew tobacco; not ready to quit at this time. 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   Overweight: Diet is so-so; no longer exercising like he used to because of gym closures. He does some cardio, but admits not as much as he should.  Patient Active Problem List   Diagnosis Date Noted  . Polyp of colon   . Diverticulosis of large intestine without diverticulitis   . Low HDL (under 40) 05/13/2018  . Type 2 diabetes mellitus, controlled ([place]) 02/08/2018  . History of myocardial infarction 11/08/2017  . Tobacco use 11/08/2017  . Ischemic cardiomyopathy 11/08/2017  . CHF (congestive heart failure) ([place]) 11/08/2017  . Foot callus 11/08/2017  . Onychomycosis 11/08/2017    Past Surgical History:  Procedure Laterality Date  . COLONOSCOPY WITH PROPOFOL N/A 04/04/2019   Procedure: COLONOSCOPY WITH PROPOFOL;  Surgeon: Virgel Manifold, MD;  Location: [facility] ENDOSCOPY;  Service: Endoscopy;  Laterality: N/A;  . EYE SURGERY    . LEFT HEART CATH AND CORONARY ANGIOGRAPHY N/A 07/21/2017   Procedure: LEFT HEART CATH AND CORONARY ANGIOGRAPHY;  Surgeon: Wellington Hampshire, MD;  Location: [place] [facility];  Service: Cardiovascular;  Laterality: N/A;    Family History  Problem Relation Age of Onset  . Heart disease Mother   . Hyperlipidemia Mother   . Hypertension Mother   . Heart attack Sister     Social History   Socioeconomic History  . Marital status: Married    Spouse name: Not on file  . Number of children: Not on file  . Years of education: Not on file  . Highest education level: Not on file  Occupational History  . Not on file  Social Needs  . Financial resource strain: Not on file  . Food insecurity    Worry: Not on file    Inability: Not on file  . Transportation needs    Medical: Not on file    Non-medical: Not on file  Tobacco Use  . Smoking status: Current Every Day Smoker    Packs/day: 0.50    Years: 25.00    Pack years: 12.50    Types: Cigarettes  . Smokeless tobacco: Never Used  . Tobacco comment: 09/06/17 Tobacco cessation reviewed. Currently smoking about 10 cigs a day.  Substance and Sexual Activity  . Alcohol use: Yes  Comment: rare  . Drug use: No  . Sexual activity: Yes  Lifestyle  . Physical activity    Days per week: Not on file    Minutes per session: Not on file  . Stress: Not on file  Relationships  . Social Herbalist on phone: Not on file    Gets together: Not on file    Attends religious service: Not on file    Active member of club or organization: Not on file    Attends meetings of clubs or organizations: Not on file    Relationship status: Not on file  . Intimate partner violence    Fear of current or ex partner: Not on file    Emotionally abused: Not on file    Physically abused: Not on file    Forced sexual activity: Not on file  Other  Topics Concern  . Not on file  Social History Narrative  . Not on file     Current Outpatient Medications:  .  aspirin 81 MG chewable tablet, Chew 1 tablet (81 mg total) by mouth daily., Disp: 30 tablet, Rfl: 2 .  atorvastatin (LIPITOR) 80 MG tablet, TAKE 1 TABLET (80 MG TOTAL) BY MOUTH DAILY AT 6 PM. *NEEDS APPT FOR FURTHER FILLS*, Disp: 30 tablet, Rfl: 5 .  Blood Gluc Meter Disp-Strips (BLOOD GLUCOSE METER DISPOSABLE) DEVI, Check fingerstick blood sugars once a day; E11.69, LON 99 months, Disp: 100 each, Rfl: 1 .  carvedilol (COREG) 3.125 MG tablet, Take 1 tablet (3.125 mg total) by mouth 2 (two) times daily with a meal., Disp: 180 tablet, Rfl: 2 .  clopidogrel (PLAVIX) 75 MG tablet, TAKE 1 TABLET (75 MG TOTAL) BY MOUTH DAILY WITH BREAKFAST., Disp: 90 tablet, Rfl: 3 .  Ertugliflozin L-PyroglutamicAc (STEGLATRO) 5 MG TABS, Take 1 tablet by mouth daily., Disp: 90 tablet, Rfl: 3 .  nitroGLYCERIN (NITROSTAT) 0.4 MG SL tablet, Place 1 tablet (0.4 mg total) under the tongue every 5 (five) minutes as needed for chest pain., Disp: 30 tablet, Rfl: 2 .  sacubitril-valsartan (ENTRESTO) 24-26 MG, Take 1 tablet by mouth 2 (two) times daily., Disp: 60 tablet, Rfl: 2 .  spironolactone (ALDACTONE) 25 MG tablet, Take 1 tablet (25 mg total) by mouth daily., Disp: 30 tablet, Rfl: 0  No Known Allergies  I personally reviewed active problem list, medication list, allergies, notes from last encounter, lab results with the patient/caregiver today.   ROS  Constitutional: Negative for fever or weight change.  Respiratory: Negative for cough and shortness of breath.   Cardiovascular: Negative for chest pain or palpitations.  Gastrointestinal: Negative for abdominal pain, no bowel changes.  Musculoskeletal: Negative for gait problem or joint swelling.  Skin: Negative for rash.  Neurological: Negative for dizziness or headache.  No other specific complaints in a complete review of systems (except as listed in  HPI above).  Objective  Vitals:   06/10/19 1101  BP: 128/72  Pulse: 80  Resp: 18  Temp: 97.9 F (36.6 C)  TempSrc: Temporal  SpO2: 96%  Weight: 216 lb 11.2 oz (98.3 kg)  Height: 6' (1.829 m)    Body mass index is 29.39 kg/m.  Physical Exam  Constitutional: Patient appears well-developed and well-nourished. No distress.  HENT: Head: Normocephalic and atraumatic.  Eyes: Conjunctivae and EOM are normal. No scleral icterus.  Neck: Normal range of motion. Neck supple. No JVD present.  Cardiovascular: Normal rate, regular rhythm and normal heart sounds.  No murmur heard. No BLE  edema. Pulmonary/Chest: Effort normal and breath sounds normal. No respiratory distress. Musculoskeletal: Normal range of motion, no joint effusions. No gross deformities Neurological: Pt is alert and oriented to person, place, and time. No cranial nerve deficit. Coordination, balance, strength, speech and gait are normal.  Skin: See Diabetic foot exam Psychiatric: Patient has a normal mood and affect. behavior is normal. Judgment and thought content normal.  No results found for this or any previous visit (from the past 72 hour(s)).  Diabetic Foot Exam: Diabetic Foot Exam - Simple   Simple Foot Form Diabetic Foot exam was performed with the following findings: Yes 06/10/2019 11:22 AM  Visual Inspection See comments: Yes Sensation Testing Intact to touch and monofilament testing bilaterally: Yes Pulse Check Posterior Tibialis and Dorsalis pulse intact bilaterally: Yes Comments There is significant thickening to the bilateral heels. There is some mild maceration between the bilateral great and 2nd toes.  The great toes and 3rd toes bilaterally have thickened, discolored nails consistent with onychomycosis.      PHQ2/9: Depression screen [facility] 2/9 06/10/2019 11/11/2018 05/13/2018 05/13/2018 02/08/2018  Decreased Interest 0 0 0 0 0  Down, Depressed, Hopeless 0 0 0 0 0  PHQ - 2 Score 0 0 0 0 0  Altered  sleeping 0 0 0 - -  Tired, decreased energy 0 0 0 - -  Change in appetite 0 0 0 - -  Feeling bad or failure about yourself  0 0 0 - -  Trouble concentrating 0 0 0 - -  Moving slowly or fidgety/restless 0 0 0 - -  Suicidal thoughts 0 0 0 - -  PHQ-9 Score 0 0 0 - -  Difficult doing work/chores Not difficult at all Not difficult at all Not difficult at all - -   PHQ-2/9 Result is negative.    Fall Risk: Fall Risk  06/10/2019 11/11/2018 05/13/2018 02/08/2018 11/08/2017  Falls in the past year? 0 0 No No No  Number falls in past yr: 0 0 - - -  Injury with Fall? 0 0 - - -  Follow up Falls evaluation completed - - - -   Assessment & Plan  1. Chronic systolic heart failure ([place]) - Seeing Cardiology and compliant with medication; euvolemic today  2. Type 2 diabetes mellitus with other specified complication, without long-term current use of insulin (HCC) - Ertugliflozin L-PyroglutamicAc (STEGLATRO) 5 MG TABS; Take 1 tablet by mouth daily.  Dispense: 90 tablet; Refill: 3 - COMPLETE METABOLIC PANEL WITH GFR - Hemoglobin A1c - Ambulatory referral to Podiatry  3. Chronic systolic congestive heart failure ([place]) - Seeing Cardiology  4. Ischemic cardiomyopathy - Seeing cardiology, on entresto; stable  5. Tobacco use - Not ready for cessation at this time  6. History of myocardial infarction - Stable; no concerns at this time  7. Low HDL (under 40) - Taking statin therapy; discussed diet in detail,  8. Tinea pedis of both feet - Strongly recommend podiatry follow up - he would like referral to new podiatrist today - Ambulatory referral to Podiatry  9. Onychomycosis - Ambulatory referral to Podiatry

## 2019-06-11 LAB — HEMOGLOBIN A1C
Hgb A1c MFr Bld: 6.6 % of total Hgb — ABNORMAL HIGH (ref <5.7)
Mean Plasma Glucose: 143 (calc)
eAG (mmol/L): 7.9 (calc)

## 2019-06-11 LAB — COMPLETE METABOLIC PANEL WITH GFR
AG Ratio: 1.4 (calc) (ref 1.0–2.5)
ALT: 56 U/L — ABNORMAL HIGH (ref 9–46)
AST: 23 U/L (ref 10–35)
Albumin: 4.1 g/dL (ref 3.6–5.1)
Alkaline phosphatase (APISO): 72 U/L (ref 35–144)
BUN: 16 mg/dL (ref 7–25)
CO2: 26 mmol/L (ref 20–32)
Calcium: 9.3 mg/dL (ref 8.6–10.3)
Chloride: 107 mmol/L (ref 98–110)
Creat: 1.1 mg/dL (ref 0.70–1.25)
GFR, Est African American: 82 mL/min/{1.73_m2} (ref >=60)
GFR, Est Non African American: 71 mL/min/{1.73_m2} (ref >=60)
Globulin: 2.9 g/dL (calc) (ref 1.9–3.7)
Glucose, Bld: 122 mg/dL — ABNORMAL HIGH (ref 65–99)
Potassium: 4.1 mmol/L (ref 3.5–5.3)
Sodium: 139 mmol/L (ref 135–146)
Total Bilirubin: 0.5 mg/dL (ref 0.2–1.2)
Total Protein: 7 g/dL (ref 6.1–8.1)

## 2019-06-12 ENCOUNTER — Other Ambulatory Visit: Payer: Self-pay | Admitting: Cardiovascular Disease

## 2019-06-16 ENCOUNTER — Telehealth: Payer: Self-pay | Admitting: Family Medicine

## 2019-06-16 NOTE — Telephone Encounter (Signed)
Pt called and stated that he is still not able to pick medication Ertugliflozin L-PyroglutamicAc (STEGLATRO) 5 MG TABS [id number] up and would like to know if he could have a call back from the nurse. Please advise

## 2019-06-17 NOTE — Telephone Encounter (Signed)
Have not been able to get medication. Need new card to see if he can discount

## 2019-06-17 NOTE — Telephone Encounter (Signed)
Discount card at front desk

## 2019-06-18 ENCOUNTER — Telehealth: Payer: Self-pay | Admitting: Family Medicine

## 2019-06-18 NOTE — Telephone Encounter (Signed)
He has been on Steglatro for over a year but got it free on discount card. Discount card he can no longer use. Do you want to try for a PA or use Jardiance. [facility] that name as a replacement over this morning

## 2019-06-18 NOTE — Telephone Encounter (Signed)
Try PA and/or providing new coupon card first, then will switch if needed.  If he is out completely, please let me know so that we can help him obtain emergency fill.

## 2019-06-18 NOTE — Telephone Encounter (Signed)
We will try for a PA

## 2019-06-18 NOTE — Telephone Encounter (Signed)
Pt called and stated that the discount card did not work. Pt would like a call back.

## 2019-06-20 ENCOUNTER — Other Ambulatory Visit: Payer: Self-pay

## 2019-06-20 ENCOUNTER — Telehealth: Payer: Self-pay | Admitting: Emergency Medicine

## 2019-06-20 MED ORDER — ENTRESTO 24-26 MG PO TABS: 1.0000 | ORAL_TABLET | Freq: Two times a day (BID) | ORAL | 1 refills | Status: DC

## 2019-06-20 MED ORDER — JARDIANCE 10 MG PO TABS: 10.0000 mg | ORAL_TABLET | Freq: Every day | ORAL | 1 refills | Status: DC

## 2019-06-20 NOTE — Telephone Encounter (Signed)
Lawrence Holmes patient.

## 2019-06-20 NOTE — Telephone Encounter (Signed)
Has tried a new card. Please check on a PA

## 2019-06-20 NOTE — Telephone Encounter (Signed)
Patient has spoke to [employer], they [place] pay for [employer]. Please call him in something else. He is out of medication.

## 2019-06-20 NOTE — Telephone Encounter (Signed)
Jardiance sent in as alternative.

## 2019-06-23 ENCOUNTER — Other Ambulatory Visit: Payer: Self-pay | Admitting: Cardiovascular Disease

## 2019-06-23 NOTE — Telephone Encounter (Signed)
Patient stated he got medication

## 2019-07-07 ENCOUNTER — Telehealth: Payer: Self-pay

## 2019-07-07 NOTE — Telephone Encounter (Addendum)
Patient notified the Entresto 24-26 mg was previously approved from 08/03/2017 to 08-02-2020 but the patient stated, "the Rx was cancelled due to problems with his health insurance."

## 2019-07-07 NOTE — Telephone Encounter (Signed)
Fax received requesting Prior Authorization for [employer]. KEY: [id number] Demographics completed in CoverMyMeds and sent to plan. Response received: This medication or product was previously approved on [id number] from 2017-08-03 to 2020-08-02. Archived request.

## 2019-07-08 NOTE — Telephone Encounter (Signed)
Spoke with Thurmond Butts (pharmacist) with [facility] and he stated, "the patient is good to go and the patient picked up the [employer] on Nov. 3,2020.

## 2019-08-01 ENCOUNTER — Telehealth: Payer: Self-pay | Admitting: Cardiovascular Disease

## 2019-08-01 NOTE — Telephone Encounter (Signed)
I called and spoke with the patient's sister, Wilber Oliphant and the patient himself regarding his DOT clearance.  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  The patient reports he is now unable to work until he receives clearance from cardiology.  I advised the patient, per Ignacia Bayley, NP's last note from his 01/23/19 telehealth visit:  Follow-up in 6 months with Dr. Fletcher Anon.  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  I advised the patient, he will be coming due for his GXT to be renewed. The patient states he only needs this every 2 years. He is correct that he has not met that window yet- his last GXT was 10/2017, but he will be due by 10/2019. However, his current DOT clearance has expired and he can't work without cardiology approval.  The patient is aware I will need to send a message to Dr. Fletcher Anon Ignacia Bayley, NP to please advise. He is also aware since this is short notice and Friday afternoon, he may not have an answer back today.  The patient voices understanding and is agreeable.   The patient is currently scheduled to see Dr. Fletcher Anon on 09/04/18.

## 2019-08-01 NOTE — Telephone Encounter (Signed)
Patient calling to check on status  Please inform Lawrence Holmes at [phone] when updated information received

## 2019-08-01 NOTE — Telephone Encounter (Signed)
I think it's ok to clear him provided that he has cont to do well.  He will just need that ETT in March in order to continue driving.

## 2019-08-01 NOTE — Telephone Encounter (Signed)
Letter drafted and faxed to the fax # provided for [facility] # [phone]. Fax confirmation received.  DPR on file. Spoke with the patients sister Lawrence Holmes. Advised her that the DOT letter has been faxed and that the patient is cleared to drive from a cardiac standpoint up to 11/06/2019 at which time he will be due for his required gxt. Kallie Edward that the patient is to keep his Jan 2021 appt with Dr. Fletcher Anon at that time the required testing can be coordinated.  Lawrence Holmes will communicate the message to the patient. Lawrence Holmes verbalized understanding to the information given and voiced appreciation for the assistance.

## 2019-08-01 NOTE — Telephone Encounter (Signed)
Patient needs clearance for his DOT. Please fax to urgent care, [facility] [phone]

## 2019-08-01 NOTE — Telephone Encounter (Signed)
Patient's last appt was virtual with Ignacia Bayley, NP on 01/23/19. Message fwd to [place] to advise on DOT clearance.

## 2019-08-06 ENCOUNTER — Other Ambulatory Visit: Payer: Self-pay

## 2019-08-06 MED ORDER — ATORVASTATIN CALCIUM 80 MG PO TABS: 80.0000 mg | ORAL_TABLET | Freq: Every day | ORAL | 0 refills | Status: DC

## 2019-08-25 ENCOUNTER — Other Ambulatory Visit: Payer: Self-pay | Admitting: *Deleted

## 2019-08-25 MED ORDER — ENTRESTO 24-26 MG PO TABS: 1.0000 | ORAL_TABLET | Freq: Two times a day (BID) | ORAL | 1 refills | Status: DC

## 2019-09-05 ENCOUNTER — Ambulatory Visit: Payer: Self-pay | Admitting: Cardiovascular Disease

## 2019-09-11 ENCOUNTER — Other Ambulatory Visit: Payer: Self-pay

## 2019-09-11 ENCOUNTER — Ambulatory Visit (INDEPENDENT_AMBULATORY_CARE_PROVIDER_SITE_OTHER): Payer: 59 | Admitting: Cardiovascular Disease

## 2019-09-11 VITALS — BP 118/62 | HR 76 | Ht 72.0 in | Wt 214.8 lb

## 2019-09-11 DIAGNOSIS — I2510 Atherosclerotic heart disease of native coronary artery without angina pectoris: Secondary | ICD-10-CM | POA: Diagnosis not present

## 2019-09-11 DIAGNOSIS — I5022 Chronic systolic (congestive) heart failure: Secondary | ICD-10-CM | POA: Diagnosis not present

## 2019-09-11 DIAGNOSIS — E785 Hyperlipidemia, unspecified: Secondary | ICD-10-CM | POA: Diagnosis not present

## 2019-09-11 NOTE — [facility] Notes (Signed)
Cardiology Office Note   Date:  09/11/2019   ID:  Lawrence Holmes, DOB 09/29/1955, MRN [id number]  PCP:  Hubbard Hartshorn, FNP  Cardiologist:   Kathlyn Sacramento, MD   Chief Complaint  Patient presents with  . Other    6 month follow up. Patient denies chest pain and SOB. Meds reviewed verbally with patient.       History of Present Illness: Lawrence Holmes is a 64 y.o. male who presents for a follow-up visit regarding coronary artery disease.   He presented in December 2018 with inferior ST elevation myocardial infarction with late presentation. Cardiac catheterization which severe two-vessel coronary artery disease with total occlusion of the proximal right coronary artery with left-to-right collaterals and occluded distal left circumflex with collaterals.  No obstructive disease affecting the LAD.  The culprit was felt to be the right coronary artery.  Ejection fraction was 25-30% with inferior wall akinesis.  I probed the right coronary artery with a wire and was not able to cross easily and thus no further intervention was performed.  Echocardiogram showed an EF of 30-35% with mild to moderate mitral regurgitation.  The patient was treated medically.  Repeat echocardiogram in March 2019 showed improvement in EF to 40-45% with akinesis of inferior and inferoseptal myocardium. He works as a Forensic scientist.   He has been doing very well with no chest pain or shortness of breath.  He takes his medications regularly.    Past Medical History:  Diagnosis Date  . Chronic systolic heart failure ([place]) 07/2017   a. 07/2017 Echo: EF 30-35%, Gr1 DD, inflat, and inf AK, mild to mod MR, mildly dil LA/RA; b. 10/2017 Echo: EF 40-45%, basal-midinf and infsept AK. Mild LVH. Mild MR. Nl RV fxn.  . Coronary artery disease 07/2017   a. 07/2017 Late presenting inferior STEMI/Cath: LM nl, LAD min irregs, LCX 100d (L->L collats), RCA 100p (L->R collats), EF 25-35%-->Med Rx; b. 10/2017 ETT (DOT): Ex time  7:38, baseline antlat TWI, no acute changes.   . Diabetes mellitus without complication ([place])   . Hyperlipidemia   . Ischemic cardiomyopathy    a. 07/2017 Echo: EF 30-35%; b. 10/2017 Echo: EF 40-45%.  . Tobacco use     Past Surgical History:  Procedure Laterality Date  . COLONOSCOPY WITH PROPOFOL N/A 04/04/2019   Procedure: COLONOSCOPY WITH PROPOFOL;  Surgeon: Virgel Manifold, MD;  Location: [facility] ENDOSCOPY;  Service: Endoscopy;  Laterality: N/A;  . EYE SURGERY    . LEFT HEART CATH AND CORONARY ANGIOGRAPHY N/A 07/21/2017   Procedure: LEFT HEART CATH AND CORONARY ANGIOGRAPHY;  Surgeon: Wellington Hampshire, MD;  Location: [facility];  Service: Cardiovascular;  Laterality: N/A;     Current Outpatient Medications  Medication Sig Dispense Refill  . aspirin 81 MG chewable tablet Chew 1 tablet (81 mg total) by mouth daily. 30 tablet 2  . atorvastatin (LIPITOR) 80 MG tablet Take 1 tablet (80 mg total) by mouth daily at 6 PM. 30 tablet 0  . Blood Gluc Meter Disp-Strips (BLOOD GLUCOSE METER DISPOSABLE) DEVI Check fingerstick blood sugars once a day; E11.69, LON 99 months 100 each 1  . carvedilol (COREG) 3.125 MG tablet Take 1 tablet (3.125 mg total) by mouth 2 (two) times daily with a meal. 180 tablet 2  . clopidogrel (PLAVIX) 75 MG tablet TAKE 1 TABLET (75 MG TOTAL) BY MOUTH DAILY WITH BREAKFAST. 30 tablet 3  . empagliflozin (JARDIANCE) 10 MG TABS tablet Take  10 mg by mouth daily before breakfast. 90 tablet 1  . nitroGLYCERIN (NITROSTAT) 0.4 MG SL tablet Place 1 tablet (0.4 mg total) under the tongue every 5 (five) minutes as needed for chest pain. 30 tablet 2  . sacubitril-valsartan (ENTRESTO) 24-26 MG Take 1 tablet by mouth 2 (two) times daily. 60 tablet 1  . spironolactone (ALDACTONE) 25 MG tablet TAKE 1 TABLET BY MOUTH EVERY DAY 30 tablet 2   No current facility-administered medications for this visit.    Allergies:   Patient has no known allergies.    Social History:  The  patient  reports that he has been smoking cigarettes. He has a 12.50 pack-year smoking history. He has never used smokeless tobacco. He reports current alcohol use. He reports that he does not use drugs.   Family History:  The patient's family history includes Heart attack in his sister; Heart disease in his mother; Hyperlipidemia in his mother; Hypertension in his mother.    ROS:  Please see the history of present illness.   Otherwise, review of systems are positive for none.   All other systems are reviewed and negative.    PHYSICAL EXAM: VS:  BP 118/62 (BP Location: Left Arm, Patient Position: Sitting, Cuff Size: Normal)   Pulse 76   Ht 6' (1.829 m)   Wt 214 lb 12 oz (97.4 kg)   BMI 29.13 kg/m  , BMI Body mass index is 29.13 kg/m. GEN: Well nourished, well developed, in no acute distress  HEENT: normal  Neck: no JVD, carotid bruits, or masses Cardiac: RRR; no murmurs, rubs, or gallops,no edema  Respiratory:  clear to auscultation bilaterally, normal work of breathing GI: soft, nontender, nondistended, + BS MS: no deformity or atrophy  Skin: warm and dry, no rash Neuro:  Strength and sensation are intact Psych: euthymic mood, full affect   EKG:  EKG is ordered today. EKG showed normal sinus rhythm with old inferior infarct.  No significant ST or T wave changes.  Recent Labs: 06/10/2019: ALT 56; BUN 16; Creat 1.10; Potassium 4.1; Sodium 139    Lipid Panel    Component Value Date/Time   CHOL 111 11/11/2018 1022   TRIG 103 11/11/2018 1022   HDL 23 (L) 11/11/2018 1022   CHOLHDL 4.8 11/11/2018 1022   VLDL 13 09/12/2017 1559   LDLCALC 69 11/11/2018 1022      Wt Readings from Last 3 Encounters:  09/11/19 214 lb 12 oz (97.4 kg)  06/10/19 216 lb 11.2 oz (98.3 kg)  04/04/19 210 lb (95.3 kg)      No flowsheet data found.    ASSESSMENT AND PLAN:  1.  Coronary artery disease involving native coronary arteries without angina: He is doing well overall with no anginal  symptoms.   It has been more than 2 years since his myocardial infarction and thus I elected to discontinue clopidogrel.  Continue aspirin indefinitely. 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  2.  Chronic systolic heart failure due to ischemic cardiomyopathy: He appears to be euvolemic.  Most recent ejection fraction improved to 40-45% on medical therapy.  Continue carvedilol, Entresto and spironolactone  3.  Hyperlipidemia: Continue high-dose atorvastatin.  Most recent lipid profile showed an LDL of 69.  5.  Diabetes mellitus: Currently on Jardiance   Disposition:   FU with me in 6 months  Signed,  Kathlyn Sacramento, MD  09/11/2019 3:34 PM    [place]

## 2019-09-11 NOTE — Patient Instructions (Signed)
Medication Instructions: Your physician has recommended you make the following change in your medication:   Stop Plavx.  *If you need a refill on your cardiac medications before your next appointment, please call your pharmacy*  Lab Work: None ordered If you have labs (blood work) drawn today and your tests are completely normal, you will receive your results only by: Lawrence Holmes MyChart Message (if you have MyChart) OR . A paper copy in the mail If you have any lab test that is abnormal or we need to change your treatment, we will call you to review the results.  Testing/Procedures:  Your physician has requested that you have an exercise tolerance test. For further information please visit [email or web address]. Please also follow instruction sheet, as given.    Follow-Up: At [facility], you and your health needs are our priority.  As part of our continuing mission to provide you with exceptional heart care, we have created designated Provider Care Teams.  These Care Teams include your primary Cardiologist (physician) and Advanced Practice Providers (APPs -  Physician Assistants and Nurse Practitioners) who all work together to provide you with the care you need, when you need it.  Your next appointment:   6 month(s)  The format for your next appointment:   In Person  Provider:    You may see Kathlyn Sacramento, MD or one of the following Advanced Practice Providers on your designated Care Team:    Murray Hodgkins, NP  Christell Faith, PA-C  Marrianne Mood, PA-C   Other Instructions  Exercise Stress Test  An exercise stress test is a test that is done to collect information about how your heart functions during exercise. The test is done while you are walking on a treadmill or using an exercise bike. The goal is to raise your heart rate and "stress" the heart. The heart is evaluated before, during, and after you exercise. An electrocardiogram (ECG) will be used to monitor the heart,  and your blood pressure will also be monitored. In some cases, nuclear scanning or an ultrasound of the heart (echocardiogram) will also be done to evaluate your heart. An exercise stress test is done to look for coronary artery disease (CAD). The test may also be done to:  Evaluate your limits of exercise during cardiac rehabilitation.  Check for high blood pressure during exercise.  Check how well you can exercise after such treatments as coronary stenting or new medicines.  Check for problems with blood flow to your arms and legs during exercise. If you have an abnormal test result, this may mean that you are not getting enough blood flow to your heart during exercise. More testing may be needed to understand why your test was not normal. Tell a health care provider about:  Any allergies you have.  All medicines you are taking, including vitamins, herbs, eye drops, creams, and over-the-counter medicines.  Any blood disorders you have.  Any surgeries you have had.  Any medical conditions you have.  Whether you are pregnant or may be pregnant. What are the risks? Generally, this is a safe procedure. However, problems may occur, including:  Pain or pressure in the following areas: ? Chest. ? Jaw or neck. ? Between your shoulder blades. ? Down your left arm.  Dizziness or lightheadedness.  Shortness of breath.  Increased or irregular heartbeats.  Nausea or vomiting.  Heart attack (rare).  Life-threatening abnormal heart rhythm (rare). What happens before the procedure?  Follow instructions from your health care provider about  eating or drinking restrictions. ? You may be told to avoid all forms of caffeine for 24 hours before the test. This includes coffee, tea (even decaffeinated tea), caffeinated sodas, chocolate, cocoa, and certain pain medicines.  Ask your health care provider about: ? Taking over-the-counter medicines, vitamins, herbs, and supplements. ?  Changing or stopping your regular medicines. This is especially important if you are taking diabetes medicines or beta-blocker medicines.  If you have diabetes, ask how you are to take your insulin or pills. It is common to adjust your insulin dose the morning of the test.  If you are taking beta-blocker medicines, it is important to talk to your health care provider about these medicines well before the date of your test. Taking beta-blocker medicines may interfere with the test. In some cases, these medicines may need to be changed or stopped 24 hours or more before the test.  If you wear a nitroglycerin patch, it may need to be removed prior to the test. Ask your health care provider if the patch should be removed before the test.  If you use an inhaler for any breathing condition, bring it with you to the test.  Do not apply lotions, powders, creams, or oils on your chest prior to the test.  Wear loose-fitting clothes and comfortable walking shoes.  Do not use any products that contain nicotine or tobacco, such as cigarettes and e-cigarettes, for 4 hours before the test or as told by your health care provider. If you need help quitting, ask your health care provider. What happens during the procedure?  Multiple electrodes will be attached to your chest.  Multiple wires will be attached to the electrodes. These will transfer the electrical impulses from your heart to the ECG machine. Your heart will be monitored both at rest and while exercising.  If you are also having an echocardiogram or nuclear scanning, images of your heart will be taken before and after you exercise.  A blood pressure cuff will be placed around your arm to measure your blood pressure throughout the test. You will feel it tighten and loosen throughout the test.  You will walk on a treadmill or use a stationary bike. If you cannot use these, you may be asked to turn a crank with your hands.  You will start at a slow  pace or level on the exercise machine. The exercise difficulty will be slowly increased to raise your heart rate. In the case of a treadmill, the speed and incline will gradually be increased.  You may be asked to periodically breathe into a tube. This measures the gases you breathe out.  You will be asked how you are feeling throughout the test. You will be asked to rate your level of exertion.  Tell the staff right away if you feel: ? Chest pain. ? Dizziness. ? Shortness of breath. ? Too fatigued to continue. ? Pain or aching in your legs or arms.  You will exercise until you have symptoms or until you reach a target heart rate. The test will also be stopped if you have changes in your blood pressure or ECG readings, or if you develop an irregular heartbeat (arrhythmia). The procedure may vary among health care providers and hospitals. What happens after the procedure?  You will sit down and recover from the exercise. Your blood pressure, heart rate, and ECG will be monitored until you recover.  You may return to your normal schedule, including diet, activities, and medicines, unless your  health care provider tells you otherwise.  It is up to you to get your test results. Ask your health care provider, or the department that is doing the test, when your results will be ready. Summary  An exercise stress test is a test that is done to collect information about how your heart functions during exercise.  This test is done to look for coronary artery disease (CAD).  During this test, you will walk on a treadmill or use an exercise bike to raise your heart rate.  It is important to follow instructions from your health care provider about eating and drinking restrictions before the test. This may include avoiding caffeine and certain medicines before the test. This information is not intended to replace advice given to you by your health care provider. Make sure you discuss any questions  you have with your health care provider. Document Revised: 05/10/2017 Document Reviewed: 10/11/2016 Elsevier Patient Education  2020 [employer].

## 2019-09-22 ENCOUNTER — Other Ambulatory Visit: Payer: Self-pay | Admitting: Cardiovascular Disease

## 2019-09-23 ENCOUNTER — Other Ambulatory Visit: Payer: Self-pay

## 2019-09-23 MED ORDER — ATORVASTATIN CALCIUM 80 MG PO TABS: 80.0000 mg | ORAL_TABLET | Freq: Every day | ORAL | 0 refills | Status: DC

## 2019-09-30 ENCOUNTER — Other Ambulatory Visit: Payer: Self-pay | Admitting: Family Medicine

## 2019-09-30 MED ORDER — JARDIANCE 10 MG PO TABS: 10.0000 mg | ORAL_TABLET | Freq: Every day | ORAL | 1 refills | Status: DC

## 2019-09-30 NOTE — Telephone Encounter (Signed)
empagliflozin (JARDIANCE) 10 MG TABS tablet    Patient is requesting refill.    Ph[facility]  [id number] [place], [place] - [address] Phone:  [phone]  Fax:  [phone]

## 2019-09-30 NOTE — Telephone Encounter (Signed)
Order pend to [facility]

## 2019-10-06 ENCOUNTER — Other Ambulatory Visit: Payer: Self-pay | Admitting: Family Medicine

## 2019-10-09 NOTE — Telephone Encounter (Signed)
This patient would benefit from Jardiance due to his known h/o heart disease, and for cardiovascular event risk reduction. It is not just for his DM, and thus, any step wise dosing of metformin to monitor blood sugar control before allowing use of this medication is not appropriate.This is a much better choice of medication than metformin in my opinion. Please help in getting this patient the Jardiance prescribed or a related SGLT2 inhibitor (? Can drug companies help with cost as well?). Thanks. [facility]

## 2019-10-10 ENCOUNTER — Other Ambulatory Visit: Payer: Self-pay

## 2019-10-10 ENCOUNTER — Other Ambulatory Visit
Admission: RE | Admit: 2019-10-10 | Discharge: 2019-10-10 | Disposition: A | Discharge status: Home / Self Care | Payer: 59 | Source: Ambulatory Visit | Attending: Cardiovascular Disease | Admitting: Cardiovascular Disease

## 2019-10-10 DIAGNOSIS — Z01812 Encounter for preprocedural laboratory examination: Secondary | ICD-10-CM | POA: Diagnosis not present

## 2019-10-10 DIAGNOSIS — Z20822 Contact with and (suspected) exposure to covid-19: Secondary | ICD-10-CM | POA: Diagnosis not present

## 2019-10-11 LAB — SARS CORONAVIRUS 2 (TAT 6-24 HRS): SARS Coronavirus 2: NEGATIVE

## 2019-10-13 NOTE — Telephone Encounter (Signed)
Patient was originally on [employer]. Insurance would not pay. Raquel Sarna then tryied a PA for Jardiance at that time. They would not cover.

## 2019-10-14 ENCOUNTER — Ambulatory Visit (INDEPENDENT_AMBULATORY_CARE_PROVIDER_SITE_OTHER): Payer: 59

## 2019-10-14 ENCOUNTER — Other Ambulatory Visit: Payer: Self-pay

## 2019-10-14 DIAGNOSIS — I2510 Atherosclerotic heart disease of native coronary artery without angina pectoris: Secondary | ICD-10-CM

## 2019-10-14 NOTE — Telephone Encounter (Signed)
Lawrence Holmes has been approved for 1 year

## 2019-10-14 NOTE — Telephone Encounter (Signed)
Patient want to stay on jardiance. It has been approved

## 2019-10-16 ENCOUNTER — Other Ambulatory Visit: Payer: Self-pay | Admitting: *Deleted

## 2019-10-16 MED ORDER — ATORVASTATIN CALCIUM 80 MG PO TABS: 80.0000 mg | ORAL_TABLET | Freq: Every day | ORAL | 5 refills | Status: DC

## 2019-10-16 NOTE — Telephone Encounter (Signed)
Requested Prescriptions   Signed Prescriptions Disp Refills  . atorvastatin (LIPITOR) 80 MG tablet 30 tablet 5    Sig: Take 1 tablet (80 mg total) by mouth daily at 6 PM.    Authorizing Provider: Theora Gianotti    Ordering User: Britt Bottom

## 2019-10-17 ENCOUNTER — Other Ambulatory Visit: Payer: Self-pay

## 2019-10-17 MED ORDER — ENTRESTO 24-26 MG PO TABS: 1.0000 | ORAL_TABLET | Freq: Two times a day (BID) | ORAL | 3 refills | Status: DC

## 2019-10-27 ENCOUNTER — Telehealth: Payer: Self-pay | Admitting: Cardiovascular Disease

## 2019-10-27 NOTE — Telephone Encounter (Signed)
Patient calling in for stress test results from 10/14/19

## 2019-10-27 NOTE — Telephone Encounter (Signed)
ETT is awaiting review and signature by Dr. Fletcher Anon.

## 2019-10-28 LAB — EXERCISE TOLERANCE TEST
Estimated workload: 8.2 METS
Exercise duration (min): 6 min
Exercise duration (sec): 49 s
MPHR: 156 {beats}/min
Peak HR: 137 {beats}/min
Percent HR: 87 %
Rest HR: 88 {beats}/min

## 2019-10-28 NOTE — Telephone Encounter (Signed)
Patient calling back to check on the status

## 2019-10-28 NOTE — Telephone Encounter (Signed)
Returned the patient's call. Advised him that Dr. Fletcher Anon has pre-lim reviewed his ETT and it was normal.  Patient rqst a letter for DOT clearance be faxed to [phone] attn:Sabrina

## 2019-11-22 ENCOUNTER — Ambulatory Visit: Payer: 59

## 2019-11-29 ENCOUNTER — Ambulatory Visit: Payer: 59 | Attending: Oncology

## 2019-11-29 DIAGNOSIS — Z23 Encounter for immunization: Secondary | ICD-10-CM

## 2019-11-29 NOTE — Progress Notes (Signed)
   Covid-19 Vaccination Clinic  Name:  Lawrence Holmes    MRN: [id number] DOB: 04/18/1956  11/29/2019  [name] was observed post Covid-19 immunization for 15 minutes without incident. He was provided with Vaccine Information Sheet and instruction to access the V-Safe system.   [name] was instructed to call 911 with any severe reactions post vaccine: [name] Difficulty breathing  . Swelling of face and throat  . A fast heartbeat  . A bad rash all over body  . Dizziness and weakness   Immunizations Administered    Name Date Dose VIS Date Route   Pfizer COVID-19 Vaccine 11/29/2019 10:21 AM 0.3 mL 08/01/2019 Intramuscular   Manufacturer: [place]   Lot: [id number]   [place]: [id number]

## 2019-12-24 ENCOUNTER — Ambulatory Visit: Payer: 59 | Attending: Internal Medicine

## 2019-12-24 DIAGNOSIS — Z23 Encounter for immunization: Secondary | ICD-10-CM

## 2019-12-24 NOTE — Progress Notes (Signed)
   Covid-19 Vaccination Clinic  Name:  Riley D Costa Rica    MRN: [id number] DOB: 1955/11/20  12/24/2019  Mr. [place] was observed post Covid-19 immunization for 15 minutes without incident. He was provided with Vaccine Information Sheet and instruction to access the V-Safe system.   Mr. [place] was instructed to call 911 with any severe reactions post vaccine: [name] Difficulty breathing  . Swelling of face and throat  . A fast heartbeat  . A bad rash all over body  . Dizziness and weakness   Immunizations Administered    Name Date Dose VIS Date Route   Pfizer COVID-19 Vaccine 12/24/2019  3:52 PM 0.3 mL 10/15/2018 Intramuscular   Manufacturer: [employer]   Lot: [id number]   [place]: [id number]

## 2020-01-13 ENCOUNTER — Ambulatory Visit: Payer: Self-pay | Admitting: Family Medicine

## 2020-01-13 ENCOUNTER — Other Ambulatory Visit: Payer: Self-pay | Admitting: Cardiovascular Disease

## 2020-01-15 ENCOUNTER — Other Ambulatory Visit: Payer: Self-pay | Admitting: Cardiovascular Disease

## 2020-01-30 ENCOUNTER — Ambulatory Visit: Payer: 59 | Admitting: Family Medicine

## 2020-01-30 ENCOUNTER — Other Ambulatory Visit: Payer: Self-pay

## 2020-01-30 ENCOUNTER — Encounter: Payer: Self-pay | Admitting: Family Medicine

## 2020-01-30 VITALS — BP 124/66 | HR 93 | Temp 97.1°F | Resp 18 | Ht 73.0 in | Wt 215.3 lb

## 2020-01-30 DIAGNOSIS — I252 Old myocardial infarction: Secondary | ICD-10-CM | POA: Diagnosis not present

## 2020-01-30 DIAGNOSIS — Z5181 Encounter for therapeutic drug level monitoring: Secondary | ICD-10-CM

## 2020-01-30 DIAGNOSIS — I5022 Chronic systolic (congestive) heart failure: Secondary | ICD-10-CM

## 2020-01-30 DIAGNOSIS — E785 Hyperlipidemia, unspecified: Secondary | ICD-10-CM

## 2020-01-30 DIAGNOSIS — I255 Ischemic cardiomyopathy: Secondary | ICD-10-CM

## 2020-01-30 DIAGNOSIS — E1169 Type 2 diabetes mellitus with other specified complication: Secondary | ICD-10-CM

## 2020-01-30 MED ORDER — EMPAGLIFLOZIN 10 MG PO TABS: 10.0000 mg | ORAL_TABLET | Freq: Every day | ORAL | 3 refills | Status: DC

## 2020-01-30 NOTE — Progress Notes (Signed)
Name: Lawrence Holmes   MRN: [id number]    DOB: 04/23/1956   Date:01/30/2020       Progress Note  Chief Complaint  Patient presents with  . Hypertension  . Diabetes  . Hyperlipidemia     Subjective:   Lawrence Holmes is a 64 y.o. male, presents to clinic for routine follow up on the conditions listed above.  DM: Hx of being well controlled, pt taking jardiance 10 mg daily CBGs he checks when he feels like it, used to check more regularly and it was always around 100 Due for labs today. Due for eye exam,declined PCV13 today Diet and exercise - he was going to the gym and that slowed down with covid pandemic - still trying to stay active and generally healthy  Hx of MI and CHF, denies any le edema, DOE, sudden weight gain, CP with exertion, palpitations, near syncope, PND orthopnea Pt sees cardiology, take carvedilol 3.125 mg BID, entresto and spironolactone  Hyperlipidemia: Currently treated with lipitor 80 mg, pt reports good med compliance Last Lipids: Lab Results  Component Value Date   CHOL 105 01/30/2020   HDL 27 (L) 01/30/2020   LDLCALC 61 01/30/2020   TRIG 85 01/30/2020   CHOLHDL 3.9 01/30/2020   - Denies: Chest pain, shortness of breath, myalgias, claudication   Patient Active Problem List   Diagnosis Date Noted  . Polyp of colon   . Diverticulosis of large intestine without diverticulitis   . Low HDL (under 40) 05/13/2018  . Type 2 diabetes mellitus, controlled ([place]) 02/08/2018  . History of myocardial infarction 11/08/2017  . Tobacco use 11/08/2017  . Ischemic cardiomyopathy 11/08/2017  . CHF (congestive heart failure) ([place]) 11/08/2017  . Foot callus 11/08/2017  . Onychomycosis 11/08/2017    Past Surgical History:  Procedure Laterality Date  . COLONOSCOPY WITH PROPOFOL N/A 04/04/2019   Procedure: COLONOSCOPY WITH PROPOFOL;  Surgeon: Virgel Manifold, MD;  Location: [facility] ENDOSCOPY;  Service: Endoscopy;  Laterality: N/A;  . EYE SURGERY    . LEFT HEART  CATH AND CORONARY ANGIOGRAPHY N/A 07/21/2017   Procedure: LEFT HEART CATH AND CORONARY ANGIOGRAPHY;  Surgeon: Wellington Hampshire, MD;  Location: [place] [facility];  Service: Cardiovascular;  Laterality: N/A;    Family History  Problem Relation Age of Onset  . Heart disease Mother   . Hyperlipidemia Mother   . Hypertension Mother   . Heart attack Sister     Social History   Tobacco Use  . Smoking status: Current Every Day Smoker    Packs/day: 0.50    Years: 25.00    Pack years: 12.50    Types: Cigarettes  . Smokeless tobacco: Never Used  . Tobacco comment: 09/06/17 Tobacco cessation reviewed. Currently smoking about 10 cigs a day.  Vaping Use  . Vaping Use: Never used  Substance Use Topics  . Alcohol use: Yes    Comment: rare  . Drug use: No      Current Outpatient Medications:  .  aspirin 81 MG chewable tablet, Chew 1 tablet (81 mg total) by mouth daily., Disp: 30 tablet, Rfl: 2 .  atorvastatin (LIPITOR) 80 MG tablet, Take 1 tablet (80 mg total) by mouth daily at 6 PM., Disp: 30 tablet, Rfl: 5 .  Blood Gluc Meter Disp-Strips (BLOOD GLUCOSE METER DISPOSABLE) DEVI, Check fingerstick blood sugars once a day; E11.69, LON 99 months, Disp: 100 each, Rfl: 1 .  carvedilol (COREG) 3.125 MG tablet, TAKE 1 TABLET (3.125 MG TOTAL)  BY MOUTH 2 (TWO) TIMES DAILY WITH A MEAL., Disp: 180 tablet, Rfl: 0 .  empagliflozin (JARDIANCE) 10 MG TABS tablet, Take 10 mg by mouth daily before breakfast., Disp: 90 tablet, Rfl: 1 .  sacubitril-valsartan (ENTRESTO) 24-26 MG, Take 1 tablet by mouth 2 (two) times daily., Disp: 60 tablet, Rfl: 3 .  spironolactone (ALDACTONE) 25 MG tablet, TAKE 1 TABLET BY MOUTH EVERY DAY, Disp: 30 tablet, Rfl: 2 .  clopidogrel (PLAVIX) 75 MG tablet, TAKE 1 TABLET (75 MG TOTAL) BY MOUTH DAILY WITH BREAKFAST. (Patient not taking: Reported on 01/30/2020), Disp: 30 tablet, Rfl: 3 .  nitroGLYCERIN (NITROSTAT) 0.4 MG SL tablet, Place 1 tablet (0.4 mg total) under the tongue every 5  (five) minutes as needed for chest pain. (Patient not taking: Reported on 01/30/2020), Disp: 30 tablet, Rfl: 2  No Known Allergies  Chart Review Today: I personally reviewed active problem list, medication list, allergies, family history, social history, health maintenance, notes from last encounter, lab results, imaging with the patient/caregiver today.   Review of Systems  10 Systems reviewed and are negative for acute change except as noted in the HPI.  Objective:    Vitals:   01/30/20 1437  BP: 124/66  Pulse: 93  Resp: 18  Temp: (!) 97.1 F (36.2 C)  SpO2: 95%  Weight: 215 lb 4.8 oz (97.7 kg)  Height: 6\' 1"  (1.854 m)    Body mass index is 28.41 kg/m.  Physical Exam Vitals and nursing note reviewed.  Constitutional:      General: He is not in acute distress.    Appearance: Normal appearance. He is well-developed. He is not ill-appearing, toxic-appearing or diaphoretic.     Interventions: Face mask in place.  HENT:     Head: Normocephalic and atraumatic.     Jaw: No trismus.     Right Ear: External ear normal.     Left Ear: External ear normal.  Eyes:     General: Lids are normal. No scleral icterus.    Conjunctiva/sclera: Conjunctivae normal.     Pupils: Pupils are equal, round, and reactive to light.  Neck:     Trachea: Trachea and phonation normal. No tracheal deviation.  Cardiovascular:     Rate and Rhythm: Normal rate and regular rhythm.     Pulses: Normal pulses.          Radial pulses are 2+ on the right side and 2+ on the left side.       Posterior tibial pulses are 2+ on the right side and 2+ on the left side.     Heart sounds: Normal heart sounds. No murmur heard.  No friction rub. No gallop.   Pulmonary:     Effort: Pulmonary effort is normal. No respiratory distress.     Breath sounds: Normal breath sounds. No stridor. No wheezing, rhonchi or rales.  Abdominal:     General: Bowel sounds are normal. There is no distension.     Palpations: Abdomen  is soft.     Tenderness: There is no abdominal tenderness. There is no guarding or rebound.  Musculoskeletal:        General: Normal range of motion.     Cervical back: Normal range of motion and neck supple.     Right lower leg: No edema.     Left lower leg: No edema.  Skin:    General: Skin is warm and dry.     Capillary Refill: Capillary refill takes less than 2 seconds.  Coloration: Skin is not jaundiced.     Findings: No rash.     Nails: There is no clubbing.  Neurological:     Mental Status: He is alert.     Cranial Nerves: No dysarthria or facial asymmetry.     Motor: No tremor or abnormal muscle tone.     Gait: Gait normal.  Psychiatric:        Mood and Affect: Mood normal.        Speech: Speech normal.        Behavior: Behavior normal. Behavior is cooperative.       PHQ2/9: Depression screen [facility] 2/9 01/30/2020 06/10/2019 11/11/2018 05/13/2018 05/13/2018  Decreased Interest 3 0 0 0 0  Down, Depressed, Hopeless 0 0 0 0 0  PHQ - 2 Score 3 0 0 0 0  Altered sleeping 0 0 0 0 -  Tired, decreased energy 0 0 0 0 -  Change in appetite 0 0 0 0 -  Feeling bad or failure about yourself  0 0 0 0 -  Trouble concentrating 0 0 0 0 -  Moving slowly or fidgety/restless 0 0 0 0 -  Suicidal thoughts 0 0 0 0 -  PHQ-9 Score 3 0 0 0 -  Difficult doing work/chores Not difficult at all Not difficult at all Not difficult at all Not difficult at all -    phq 9 is neg, reviewed today  Fall Risk: Fall Risk  01/30/2020 06/10/2019 11/11/2018 05/13/2018 02/08/2018  Falls in the past year? 0 0 0 No No  Number falls in past yr: 0 0 0 - -  Injury with Fall? 0 0 0 - -  Follow up - Falls evaluation completed - - -    Functional Status Survey: Is the patient deaf or have difficulty hearing?: No Does the patient have difficulty seeing, even when wearing glasses/contacts?: No Does the patient have difficulty concentrating, remembering, or making decisions?: No Does the patient have difficulty  walking or climbing stairs?: No Does the patient have difficulty dressing or bathing?: No Does the patient have difficulty doing errands alone such as visiting a doctor's office or shopping?: No   Assessment & Plan:     ICD-10-CM   1. Type 2 diabetes mellitus with other specified complication, without long-term current use of insulin (HCC)  E11.69 Lipid panel    Hemoglobin A1c    COMPLETE METABOLIC PANEL WITH GFR    empagliflozin (JARDIANCE) 10 MG TABS tablet   has been well controlled on jardiance, not checking sugars, no SE or concerns with meds, due for A1C, needs DM eye exam  2. Chronic systolic congestive heart failure (HCC)  I50.22 Lipid panel    Hemoglobin A1c    COMPLETE METABOLIC PANEL WITH GFR   sees cardiology, currently no sx concerning for fluid overload  3. History of myocardial infarction  I25.2   4. Medication monitoring encounter  Z51.81 CBC with Differential/Platelet    Lipid panel    Hemoglobin A1c    COMPLETE METABOLIC PANEL WITH GFR  5. Ischemic cardiomyopathy  I25.5    per cardiology  6. Dyslipidemia  E78.5    pt tolerating statin, due for recheck of labs, encouraged heart healthy diet     Return in about 6 months (around 07/31/2020) for Routine follow-up.   Delsa Grana, PA-C 01/30/20 3:06 PM

## 2020-01-31 LAB — CBC WITH DIFFERENTIAL/PLATELET
Absolute Monocytes: 510 cells/uL (ref 200–950)
Basophils Absolute: 70 cells/uL (ref 0–200)
Basophils Relative: 0.8 %
Eosinophils Absolute: 519 cells/uL — ABNORMAL HIGH (ref 15–500)
Eosinophils Relative: 5.9 %
HCT: 48.5 % (ref 38.5–50.0)
Hemoglobin: 16.2 g/dL (ref 13.2–17.1)
Lymphs Abs: 3810 cells/uL (ref 850–3900)
MCH: 31.4 pg (ref 27.0–33.0)
MCHC: 33.4 g/dL (ref 32.0–36.0)
MCV: 94 fL (ref 80.0–100.0)
MPV: 11.6 fL (ref 7.5–12.5)
Monocytes Relative: 5.8 %
Neutro Abs: 3890 cells/uL (ref 1500–7800)
Neutrophils Relative %: 44.2 %
Platelets: 252 10*3/uL (ref 140–400)
RBC: 5.16 10*6/uL (ref 4.20–5.80)
RDW: 12.8 % (ref 11.0–15.0)
Total Lymphocyte: 43.3 %
WBC: 8.8 10*3/uL (ref 3.8–10.8)

## 2020-01-31 LAB — COMPLETE METABOLIC PANEL WITH GFR
AG Ratio: 1.6 (calc) (ref 1.0–2.5)
ALT: 37 U/L (ref 9–46)
AST: 17 U/L (ref 10–35)
Albumin: 4.1 g/dL (ref 3.6–5.1)
Alkaline phosphatase (APISO): 59 U/L (ref 35–144)
BUN: 13 mg/dL (ref 7–25)
CO2: 24 mmol/L (ref 20–32)
Calcium: 9.5 mg/dL (ref 8.6–10.3)
Chloride: 108 mmol/L (ref 98–110)
Creat: 1.11 mg/dL (ref 0.70–1.25)
GFR, Est African American: 80 mL/min/{1.73_m2} (ref >=60)
GFR, Est Non African American: 69 mL/min/{1.73_m2} (ref >=60)
Globulin: 2.5 g/dL (calc) (ref 1.9–3.7)
Glucose, Bld: 84 mg/dL (ref 65–99)
Potassium: 4.3 mmol/L (ref 3.5–5.3)
Sodium: 140 mmol/L (ref 135–146)
Total Bilirubin: 0.7 mg/dL (ref 0.2–1.2)
Total Protein: 6.6 g/dL (ref 6.1–8.1)

## 2020-01-31 LAB — HEMOGLOBIN A1C
Hgb A1c MFr Bld: 6.5 % of total Hgb — ABNORMAL HIGH (ref <5.7)
Mean Plasma Glucose: 140 (calc)
eAG (mmol/L): 7.7 (calc)

## 2020-01-31 LAB — LIPID PANEL
Cholesterol: 105 mg/dL (ref <200)
HDL: 27 mg/dL — ABNORMAL LOW (ref >=40)
LDL Cholesterol (Calc): 61 mg/dL (calc)
Non-HDL Cholesterol (Calc): 78 mg/dL (calc) (ref <130)
Total CHOL/HDL Ratio: 3.9 (calc) (ref <5.0)
Triglycerides: 85 mg/dL (ref <150)

## 2020-02-16 ENCOUNTER — Other Ambulatory Visit: Payer: Self-pay

## 2020-02-16 MED ORDER — ATORVASTATIN CALCIUM 80 MG PO TABS: 80.0000 mg | ORAL_TABLET | Freq: Every day | ORAL | 1 refills | Status: DC

## 2020-02-19 ENCOUNTER — Other Ambulatory Visit: Payer: Self-pay | Admitting: Cardiovascular Disease

## 2020-03-18 ENCOUNTER — Other Ambulatory Visit: Payer: Self-pay

## 2020-03-18 ENCOUNTER — Ambulatory Visit (INDEPENDENT_AMBULATORY_CARE_PROVIDER_SITE_OTHER): Payer: 59 | Admitting: Cardiovascular Disease

## 2020-03-18 ENCOUNTER — Encounter: Payer: Self-pay | Admitting: Cardiovascular Disease

## 2020-03-18 ENCOUNTER — Other Ambulatory Visit: Payer: Self-pay | Admitting: Cardiovascular Disease

## 2020-03-18 VITALS — BP 124/68 | HR 70 | Ht 72.0 in | Wt 217.0 lb

## 2020-03-18 DIAGNOSIS — I5022 Chronic systolic (congestive) heart failure: Secondary | ICD-10-CM

## 2020-03-18 DIAGNOSIS — I2510 Atherosclerotic heart disease of native coronary artery without angina pectoris: Secondary | ICD-10-CM

## 2020-03-18 DIAGNOSIS — I255 Ischemic cardiomyopathy: Secondary | ICD-10-CM | POA: Diagnosis not present

## 2020-03-18 DIAGNOSIS — E785 Hyperlipidemia, unspecified: Secondary | ICD-10-CM

## 2020-03-18 NOTE — Progress Notes (Signed)
Cardiology Office Note   Date:  03/18/2020   ID:  Abu D [name], DOB [birth year], MRN [id number]  PCP:  Hubbard Hartshorn, FNP  Cardiologist:   Kathlyn Sacramento, MD   Chief Complaint  Patient presents with  . Other    6 monthf ollow up. Meds reviewed verbally with patient.       History of Present Illness: Lawrence Holmes is a 64 y.o. male who presents for a follow-up visit regarding coronary artery disease.   He presented in December 2018 with inferior ST elevation myocardial infarction with late presentation. Cardiac catheterization which severe two-vessel coronary artery disease with total occlusion of the proximal right coronary artery with left-to-right collaterals and occluded distal left circumflex with collaterals.  No obstructive disease affecting the LAD.  The culprit was felt to be the right coronary artery.  Ejection fraction was 25-30% with inferior wall akinesis.  I probed the right coronary artery with a wire and was not able to cross easily and thus no further intervention was performed.  Echocardiogram showed an EF of 30-35% with mild to moderate mitral regurgitation.  The patient was treated medically.  Repeat echocardiogram in March 2019 showed improvement in EF to 40-45% with akinesis of inferior and inferoseptal myocardium. He works as a Forensic scientist.   He had a treadmill stress test done in February of this year which showed no evidence of ischemia. He exercised for 6 minutes and 49 seconds corresponding to 8.2 METS.  He has been doing well with no recent chest pain, shortness of breath or palpitations.  He takes his medications regularly.   Past Medical History:  Diagnosis Date  . Chronic systolic heart failure ([place]) 07/2017   a. 07/2017 Echo: EF 30-35%, Gr1 DD, inflat, and inf AK, mild to mod MR, mildly dil LA/RA; b. 10/2017 Echo: EF 40-45%, basal-midinf and infsept AK. Mild LVH. Mild MR. Nl RV fxn.  . Coronary artery disease 07/2017   a. 07/2017 Late  presenting inferior STEMI/Cath: LM nl, LAD min irregs, LCX 100d (L->L collats), RCA 100p (L->R collats), EF 25-35%-->Med Rx; b. 10/2017 ETT (DOT): Ex time 7:38, baseline antlat TWI, no acute changes.   . Diabetes mellitus without complication ([place])   . Hyperlipidemia   . Ischemic cardiomyopathy    a. 07/2017 Echo: EF 30-35%; b. 10/2017 Echo: EF 40-45%.  . Tobacco use     Past Surgical History:  Procedure Laterality Date  . COLONOSCOPY WITH PROPOFOL N/A 04/04/2019   Procedure: COLONOSCOPY WITH PROPOFOL;  Surgeon: Virgel Manifold, MD;  Location: [facility] ENDOSCOPY;  Service: Endoscopy;  Laterality: N/A;  . EYE SURGERY    . LEFT HEART CATH AND CORONARY ANGIOGRAPHY N/A 07/21/2017   Procedure: LEFT HEART CATH AND CORONARY ANGIOGRAPHY;  Surgeon: Wellington Hampshire, MD;  Location: [place] [facility];  Service: Cardiovascular;  Laterality: N/A;     Current Outpatient Medications  Medication Sig Dispense Refill  . aspirin 81 MG chewable tablet Chew 1 tablet (81 mg total) by mouth daily. 30 tablet 2  . atorvastatin (LIPITOR) 80 MG tablet Take 1 tablet (80 mg total) by mouth daily at 6 PM. 30 tablet 1  . Blood Gluc Meter Disp-Strips (BLOOD GLUCOSE METER DISPOSABLE) DEVI Check fingerstick blood sugars once a day; E11.69, LON 99 months 100 each 1  . carvedilol (COREG) 3.125 MG tablet TAKE 1 TABLET (3.125 MG TOTAL) BY MOUTH 2 (TWO) TIMES DAILY WITH A MEAL. 180 tablet 0  . empagliflozin (JARDIANCE) 10  MG TABS tablet Take 1 tablet (10 mg total) by mouth daily before breakfast. 90 tablet 3  . ENTRESTO 24-26 MG TAKE 1 TABLET BY MOUTH 2 (TWO) TIMES DAILY. 60 tablet 5  . nitroGLYCERIN (NITROSTAT) 0.4 MG SL tablet Place 1 tablet (0.4 mg total) under the tongue every 5 (five) minutes as needed for chest pain. 30 tablet 2  . spironolactone (ALDACTONE) 25 MG tablet TAKE 1 TABLET BY MOUTH EVERY DAY 30 tablet 2   No current facility-administered medications for this visit.    Allergies:   Patient has no known  allergies.    Social History:  The patient  reports that he has been smoking cigarettes. He has a 12.50 pack-year smoking history. He has never used smokeless tobacco. He reports current alcohol use. He reports that he does not use drugs.   Family History:  The patient's family history includes Heart attack in his sister; Heart disease in his mother; Hyperlipidemia in his mother; Hypertension in his mother.    ROS:  Please see the history of present illness.   Otherwise, review of systems are positive for none.   All other systems are reviewed and negative.    PHYSICAL EXAM: VS:  BP 124/68 (BP Location: Left Arm, Patient Position: Sitting, Cuff Size: Normal)   Pulse 70   Ht 6' (1.829 m)   Wt (!) 217 lb (98.4 kg)   BMI 29.43 kg/m  , BMI Body mass index is 29.43 kg/m. GEN: Well nourished, well developed, in no acute distress  HEENT: normal  Neck: no JVD, carotid bruits, or masses Cardiac: RRR; no murmurs, rubs, or gallops,no edema  Respiratory:  clear to auscultation bilaterally, normal work of breathing GI: soft, nontender, nondistended, + BS MS: no deformity or atrophy  Skin: warm and dry, no rash Neuro:  Strength and sensation are intact Psych: euthymic mood, full affect   EKG:  EKG is ordered today. EKG showed normal sinus rhythm with old inferior infarct.  No significant ST or T wave changes.  Recent Labs: 01/30/2020: ALT 37; BUN 13; Creat 1.11; Hemoglobin 16.2; Platelets 252; Potassium 4.3; Sodium 140    Lipid Panel    Component Value Date/Time   CHOL 105 01/30/2020 1529   TRIG 85 01/30/2020 1529   HDL 27 (L) 01/30/2020 1529   CHOLHDL 3.9 01/30/2020 1529   VLDL 13 09/12/2017 1559   LDLCALC 61 01/30/2020 1529      Wt Readings from Last 3 Encounters:  03/18/20 (!) 217 lb (98.4 kg)  01/30/20 215 lb 4.8 oz (97.7 kg)  09/11/19 214 lb 12 oz (97.4 kg)      No flowsheet data found.    ASSESSMENT AND PLAN:  1.  Coronary artery disease involving native coronary  arteries without angina: He is doing well overall with no anginal symptoms.    Continue aspirin indefinitely. He had a treadmill stress test earlier this year which was negative for ischemia.  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  2.  Chronic systolic heart failure due to ischemic cardiomyopathy: He appears to be euvolemic.  Most recent ejection fraction improved to 40-45% on medical therapy.  Continue carvedilol, Entresto and spironolactone I reviewed his recent labs in June which showed normal renal function and electrolytes.  3.  Hyperlipidemia: Continue high-dose atorvastatin.  Most recent lipid profile showed an LDL of 61.  5.  Diabetes mellitus: Currently on Jardiance   Disposition:   FU with me in 6 months  Signed,  Kathlyn Sacramento, MD  03/18/2020 9:56 AM    [place]

## 2020-03-18 NOTE — Patient Instructions (Signed)

## 2020-04-05 ENCOUNTER — Other Ambulatory Visit: Payer: Self-pay | Admitting: Cardiovascular Disease

## 2020-04-09 ENCOUNTER — Other Ambulatory Visit: Payer: Self-pay | Admitting: Cardiovascular Disease

## 2020-04-13 ENCOUNTER — Other Ambulatory Visit: Payer: Self-pay | Admitting: Cardiovascular Disease

## 2020-07-30 ENCOUNTER — Other Ambulatory Visit: Payer: Self-pay

## 2020-07-30 ENCOUNTER — Encounter: Payer: Self-pay | Admitting: Family Medicine

## 2020-07-30 ENCOUNTER — Ambulatory Visit: Payer: 59 | Admitting: Family Medicine

## 2020-07-30 VITALS — BP 128/72 | HR 88 | Temp 97.9°F | Resp 16 | Ht 72.0 in | Wt 214.8 lb

## 2020-07-30 DIAGNOSIS — B351 Tinea unguium: Secondary | ICD-10-CM

## 2020-07-30 DIAGNOSIS — E785 Hyperlipidemia, unspecified: Secondary | ICD-10-CM | POA: Insufficient documentation

## 2020-07-30 DIAGNOSIS — E1169 Type 2 diabetes mellitus with other specified complication: Secondary | ICD-10-CM

## 2020-07-30 DIAGNOSIS — I255 Ischemic cardiomyopathy: Secondary | ICD-10-CM | POA: Diagnosis not present

## 2020-07-30 DIAGNOSIS — I252 Old myocardial infarction: Secondary | ICD-10-CM

## 2020-07-30 DIAGNOSIS — I5022 Chronic systolic (congestive) heart failure: Secondary | ICD-10-CM | POA: Diagnosis not present

## 2020-07-30 LAB — POCT GLYCOSYLATED HEMOGLOBIN (HGB A1C): Hemoglobin A1C: 7 % — AB (ref 4.0–5.6)

## 2020-07-30 MED ORDER — TERBINAFINE HCL 250 MG PO TABS: 250.0000 mg | ORAL_TABLET | Freq: Every day | ORAL | 1 refills | Status: DC

## 2020-07-30 NOTE — Patient Instructions (Addendum)
Could try oral medications that treat fungal nail disease - usually takes 6-9 months of oral medications to see effect  Let me know if you want a podiatry referral      Terbinafine tablets What is this medicine? TERBINAFINE (TER bin a feen) is an antifungal medicine. It is used to treat certain kinds of fungal or yeast infections. This medicine may be used for other purposes; ask your health care provider or pharmacist if you have questions. COMMON BRAND NAME(S): Lamisil, Terbinex What should I tell my health care provider before I take this medicine? They need to know if you have any of these conditions:  drink alcoholic beverages  kidney disease  liver disease  an unusual or allergic reaction to terbinafine, other medicines, foods, dyes, or preservatives  pregnant or trying to get pregnant  breast-feeding How should I use this medicine? Take this medicine by mouth with a full glass of water. Follow the directions on the prescription label. You can take this medicine with food or on an empty stomach. Take your medicine at regular intervals. Do not take your medicine more often than directed. Do not skip doses or stop your medicine early even if you feel better. Do not stop taking except on your doctor's advice. Talk to your pediatrician regarding the use of this medicine in children. Special care may be needed. Overdosage: If you think you have taken too much of this medicine contact a poison control center or emergency room at once. NOTE: This medicine is only for you. Do not share this medicine with others. What if I miss a dose? If you miss a dose, take it as soon as you can. If it is almost time for your next dose, take only that dose. Do not take double or extra doses. What may interact with this medicine? Do not take this medicine with any of the following medications:  thioridazine This medicine may also interact with the following  medications:  beta-blockers  caffeine  cimetidine  cyclosporine  medicines for depression, anxiety, or psychotic disturbances  medicines for fungal infections like fluconazole and ketoconazole  medicines for irregular heartbeat like amiodarone, flecainide and propafenone  rifampin  warfarin This list may not describe all possible interactions. Give your health care provider a list of all the medicines, herbs, non-prescription drugs, or dietary supplements you use. Also tell them if you smoke, drink alcohol, or use illegal drugs. Some items may interact with your medicine. What should I watch for while using this medicine? Visit your doctor or health care provider regularly. Tell your doctor right away if you have nausea or vomiting, loss of appetite, stomach pain on your right upper side, yellow skin, dark urine, light stools, or are over tired. Some fungal infections need many weeks or months of treatment to cure. If you are taking this medicine for a long time, you will need to have important blood work done. This medicine may cause serious skin reactions. They can happen weeks to months after starting the medicine. Contact your health care provider right away if you notice fevers or flu-like symptoms with a rash. The rash may be red or purple and then turn into blisters or peeling of the skin. Or, you might notice a red rash with swelling of the face, lips or lymph nodes in your neck or under your arms. What side effects may I notice from receiving this medicine? Side effects that you should report to your doctor or health care professional as soon  as possible:  allergic reactions like skin rash or hives, swelling of the face, lips, or tongue  changes in vision  dark urine  fever or infection  general ill feeling or flu-like symptoms  light-colored stools  loss of appetite, nausea  rash, fever, and swollen lymph nodes  redness, blistering, peeling or loosening of the  skin, including inside the mouth  right upper belly pain  unusually weak or tired  yellowing of the eyes or skin Side effects that usually do not require medical attention (report to your doctor or health care professional if they continue or are bothersome):  changes in taste  diarrhea  hair loss  muscle or joint pain  stomach gas  stomach upset This list may not describe all possible side effects. Call your doctor for medical advice about side effects. You may report side effects to FDA at 1-800-FDA-1088. Where should I keep my medicine? Keep out of the reach of children. Store at room temperature below 25 degrees C (77 degrees F). Protect from light. Throw away any unused medicine after the expiration date. NOTE: This sheet is a summary. It may not cover all possible information. If you have questions about this medicine, talk to your doctor, pharmacist, or health care provider.  2020 Elsevier/Gold Standard (2018-11-15 15:37:07)

## 2020-07-30 NOTE — Progress Notes (Signed)
Name: Lawrence Holmes   MRN: [id number]    DOB: [birth year]   Date:07/30/2020       Progress Note  Chief Complaint  Patient presents with  . Follow-up     Subjective:   Lawrence Holmes is a 64 y.o. male, presents to clinic for routine f/up  DM:   Pt managing DM with Jardiance 10 mg daily Reports good med compliance Pt has no SE from meds. Blood sugars -not checking Denies: Polyuria, polydipsia, vision changes, neuropathy, hypoglycemia Recent pertinent labs: Lab Results  Component Value Date   HGBA1C 7.0 (A) 07/30/2020   HGBA1C 6.5 (H) 01/30/2020   HGBA1C 6.6 (H) 06/10/2019   Standard of care and health maintenance: Urine Microalbumin: Due Foot exam: Done today DM eye exam: Due  Hypertension:   History of heart failure (chronic systolic CH), ischemic cardiomyopathy, history of MI, CAD -patient sees Dr. Fletcher Anon about every 6 months, most recent office visit reviewed, he is on aspirin, Lipitor, Coreg, Entresto, spironolactone. He has nitroglycerin sublingual tablets but has never used them He denies any exertional symptoms denies lower extremity edema, weight changes, orthopnea, dyspnea on exertion, chest pressure, chest pain, palpitations Pt reports good med compliance and denies any SE.   Blood pressure today is well controlled. BP Readings from Last 3 Encounters:  07/30/20 128/72  03/18/20 124/68  01/30/20 124/66   Pt denies CP, SOB, exertional sx, LE edema, palpitation, Ha's, visual disturbances, lightheadedness, hypotension, syncope.  Hyperlipidemia: Currently treated with Lipitor 80 mg daily, pt reports good med compliance Last Lipids: Lab Results  Component Value Date   CHOL 105 01/30/2020   HDL 27 (L) 01/30/2020   LDLCALC 61 01/30/2020   TRIG 85 01/30/2020   CHOLHDL 3.9 01/30/2020   - Denies: Chest pain, shortness of breath, myalgias, claudication Most recent labs reviewed, patient's LDL is at goal good medication tolerance no side effects or concerns  Fungal  nail disease, bilateral toenails are extremely thick and raised he banged his foot on something getting on the shower today and has some bleeding above his right great toenail He states he went to the podiatrist but he did not really like them all that it was cut his nails and the did not help him with anything else on his feet All of his nails besides his great toenails bilaterally are long in length but not thickened raised I do not see any ingrown toenails, he does have callus skin on his feet but I do not see any ulcers or wounds Dm foot exam done today - some decreased sensation with monofilament testing over areas of callus feet especially heels  Tobacco use: Smoking cessation instruction/counseling given:  counseled patient on the dangers of tobacco use, advised patient to stop smoking, and reviewed strategies to maximize success    Current Outpatient Medications:  .  aspirin 81 MG chewable tablet, Chew 1 tablet (81 mg total) by mouth daily., Disp: 30 tablet, Rfl: 2 .  atorvastatin (LIPITOR) 80 MG tablet, TAKE 1 TABLET (80 MG TOTAL) BY MOUTH DAILY AT 6 PM., Disp: 30 tablet, Rfl: 3 .  Blood Gluc Meter Disp-Strips (BLOOD GLUCOSE METER DISPOSABLE) DEVI, Check fingerstick blood sugars once a day; E11.69, LON 99 months, Disp: 100 each, Rfl: 1 .  carvedilol (COREG) 3.125 MG tablet, TAKE 1 TABLET (3.125 MG TOTAL) BY MOUTH 2 (TWO) TIMES DAILY WITH A MEAL., Disp: 180 tablet, Rfl: 1 .  empagliflozin (JARDIANCE) 10 MG TABS tablet, Take 1 tablet (10 mg total)  by mouth daily before breakfast., Disp: 90 tablet, Rfl: 3 .  ENTRESTO 24-26 MG, TAKE 1 TABLET BY MOUTH 2 (TWO) TIMES DAILY., Disp: 60 tablet, Rfl: 5 .  nitroGLYCERIN (NITROSTAT) 0.4 MG SL tablet, Place 1 tablet (0.4 mg total) under the tongue every 5 (five) minutes as needed for chest pain., Disp: 30 tablet, Rfl: 2 .  spironolactone (ALDACTONE) 25 MG tablet, TAKE 1 TABLET BY MOUTH EVERY DAY, Disp: 90 tablet, Rfl: 3  Patient Active Problem List    Diagnosis Date Noted  . Polyp of colon   . Diverticulosis of large intestine without diverticulitis   . Low HDL (under 40) 05/13/2018  . Type 2 diabetes mellitus, controlled ([place]) 02/08/2018  . History of myocardial infarction 11/08/2017  . Tobacco use 11/08/2017  . Ischemic cardiomyopathy 11/08/2017  . CHF (congestive heart failure) ([place]) 11/08/2017  . Foot callus 11/08/2017  . Onychomycosis 11/08/2017    Past Surgical History:  Procedure Laterality Date  . COLONOSCOPY WITH PROPOFOL N/A 04/04/2019   Procedure: COLONOSCOPY WITH PROPOFOL;  Surgeon: Virgel Manifold, MD;  Location: [facility] ENDOSCOPY;  Service: Endoscopy;  Laterality: N/A;  . EYE SURGERY    . LEFT HEART CATH AND CORONARY ANGIOGRAPHY N/A 07/21/2017   Procedure: LEFT HEART CATH AND CORONARY ANGIOGRAPHY;  Surgeon: Wellington Hampshire, MD;  Location: [place] [facility];  Service: Cardiovascular;  Laterality: N/A;    Family History  Problem Relation Age of Onset  . Heart disease Mother   . Hyperlipidemia Mother   . Hypertension Mother   . Heart attack Sister     Social History   Tobacco Use  . Smoking status: Current Every Day Smoker    Packs/day: 0.50    Years: 25.00    Pack years: 12.50    Types: Cigarettes  . Smokeless tobacco: Never Used  . Tobacco comment: 09/06/17 Tobacco cessation reviewed. Currently smoking about 10 cigs a day.  Vaping Use  . Vaping Use: Never used  Substance Use Topics  . Alcohol use: Yes    Comment: rare  . Drug use: No     No Known Allergies  Health Maintenance  Topic Date Due  . URINE MICROALBUMIN  11/11/2019  . FOOT EXAM  06/09/2020  . COVID-19 Vaccine (3 - Booster for Pfizer series) 06/25/2020  . OPHTHALMOLOGY EXAM  07/30/2020 (Originally 06/12/2019)  . INFLUENZA VACCINE  11/18/2020 (Originally 03/21/2020)  . TETANUS/TDAP  07/30/2021 (Originally 12/26/1973)  . PNA vac Low Risk Adult (1 of 2 - PCV13) 07/30/2021 (Originally 12/27/2019)  . HEMOGLOBIN A1C  01/28/2021  .  COLONOSCOPY  04/03/2024  . Hepatitis C Screening  Completed  . HIV Screening  Completed    Chart Review Today: I personally reviewed active problem list, medication list, allergies, family history, social history, health maintenance, notes from last encounter, lab results, imaging with the patient/caregiver today.   Review of Systems  10 Systems reviewed and are negative for acute change except as noted in the HPI.  Objective:   Vitals:   07/30/20 0939  BP: 128/72  Pulse: 88  Resp: 16  Temp: 97.9 F (36.6 C)  TempSrc: Oral  SpO2: 98%  Weight: 214 lb 12.8 oz (97.4 kg)  Height: 6' (1.829 m)    Body mass index is 29.13 kg/m.  Physical Exam Vitals and nursing note reviewed.  Constitutional:      General: He is not in acute distress.    Appearance: Normal appearance. He is well-developed. He is not ill-appearing, toxic-appearing or diaphoretic.  Interventions: Face mask in place.  HENT:     Head: Normocephalic and atraumatic.     Jaw: No trismus.     Right Ear: External ear normal.     Left Ear: External ear normal.  Eyes:     General: Lids are normal. No scleral icterus.       Right eye: No discharge.        Left eye: No discharge.     Conjunctiva/sclera: Conjunctivae normal.  Neck:     Trachea: Trachea and phonation normal. No tracheal deviation.  Cardiovascular:     Rate and Rhythm: Normal rate and regular rhythm.     Pulses: Normal pulses.          Radial pulses are 2+ on the right side and 2+ on the left side.       Posterior tibial pulses are 2+ on the right side and 2+ on the left side.     Heart sounds: Normal heart sounds. No murmur heard. No friction rub. No gallop.   Pulmonary:     Effort: Pulmonary effort is normal. No respiratory distress.     Breath sounds: Normal breath sounds. No stridor. No wheezing, rhonchi or rales.  Abdominal:     General: Bowel sounds are normal. There is no distension.     Palpations: Abdomen is soft.  Musculoskeletal:      Right lower leg: No edema.     Left lower leg: No edema.  Skin:    General: Skin is warm and dry.     Coloration: Skin is not jaundiced.     Findings: No rash.     Nails: There is no clubbing.  Neurological:     Mental Status: He is alert. Mental status is at baseline.     Cranial Nerves: No dysarthria or facial asymmetry.     Motor: No tremor or abnormal muscle tone.     Gait: Gait normal.  Psychiatric:        Mood and Affect: Mood normal.        Speech: Speech normal.        Behavior: Behavior normal. Behavior is cooperative.      Diabetic Foot Exam - Simple   Simple Foot Form Diabetic Foot exam was performed with the following findings: Yes 07/30/2020 10:00 AM  Visual Inspection Sensation Testing Pulse Check Comments Bilateral great toenails very thickened nearly to 1 cm tall, brittle, broken back on the nailbed, abrasion with scant bleeding to right great toe      Assessment & Plan:   1. Type 2 diabetes mellitus with other specified complication, without long-term current use of insulin (HCC) A1c increased a little bit but is still at goal at 7.0 continue Jardiance and healthy diet and exercise He is due for his diabetic eye exam but cannot get an appointment for a few months - POCT HgB A1C - COMPLETE METABOLIC PANEL WITH GFR - Microalbumin, urine  2. Chronic systolic congestive heart failure ([place]) Per cardiology, patient's vital signs are stable here today he appears euvolemic no symptoms or signs concerning for fluid overload or worsening heart failure symptoms - COMPLETE METABOLIC PANEL WITH GFR  3. History of myocardial infarction No angina or anginal equivalent symptoms with exertion, he is compliant with cardiology follow-up - COMPLETE METABOLIC PANEL WITH GFR  4. Ischemic cardiomyopathy See #2 and 3, per cardiology - COMPLETE METABOLIC PANEL WITH GFR  5. Dyslipidemia Last lipids reviewed, LDL at goal, continue Lipitor 80 mg daily  and healthy diet  and exercise as able no claudication symptoms - COMPLETE METABOLIC PANEL WITH GFR  6. Onychomycosis of multiple toenails with type 2 diabetes mellitus ([place]) See below - COMPLETE METABOLIC PANEL WITH GFR - terbinafine (LAMISIL) 250 MG tablet; Take 1 tablet (250 mg total) by mouth daily.  Dispense: 90 tablet; Refill: 1  Fungal toenail disease great toe nail bilaterally - pt to notify me if he wants podiatry referral -offered today and he did decline Discussed oral medications and nail care, he agrees to try Lamisil, his liver function has been normal before did review interactions with this medication there is a CYP interaction that is a mild to moderate side effect encouraged him to check with his pharmacist prior to taking this medication it may increase the effects of his carvedilol    Return in about 6 months (around 01/28/2021).   Delsa Grana, PA-C 07/30/20 9:58 AM

## 2020-07-31 LAB — COMPLETE METABOLIC PANEL WITH GFR
AG Ratio: 1.5 (calc) (ref 1.0–2.5)
ALT: 34 U/L (ref 9–46)
AST: 18 U/L (ref 10–35)
Albumin: 4.1 g/dL (ref 3.6–5.1)
Alkaline phosphatase (APISO): 62 U/L (ref 35–144)
BUN: 16 mg/dL (ref 7–25)
CO2: 26 mmol/L (ref 20–32)
Calcium: 9.6 mg/dL (ref 8.6–10.3)
Chloride: 107 mmol/L (ref 98–110)
Creat: 1.02 mg/dL (ref 0.70–1.25)
GFR, Est African American: 89 mL/min/{1.73_m2} (ref >=60)
GFR, Est Non African American: 77 mL/min/{1.73_m2} (ref >=60)
Globulin: 2.8 g/dL (calc) (ref 1.9–3.7)
Glucose, Bld: 132 mg/dL — ABNORMAL HIGH (ref 65–99)
Potassium: 4.3 mmol/L (ref 3.5–5.3)
Sodium: 141 mmol/L (ref 135–146)
Total Bilirubin: 0.6 mg/dL (ref 0.2–1.2)
Total Protein: 6.9 g/dL (ref 6.1–8.1)

## 2020-07-31 LAB — MICROALBUMIN, URINE: Microalb, Ur: 0.4 mg/dL

## 2020-08-06 ENCOUNTER — Encounter: Payer: Self-pay | Admitting: Family

## 2020-08-06 ENCOUNTER — Other Ambulatory Visit: Payer: Self-pay

## 2020-08-06 ENCOUNTER — Ambulatory Visit: Payer: 59 | Admitting: Family

## 2020-08-06 VITALS — BP 128/70 | HR 75 | Ht 72.0 in | Wt 217.4 lb

## 2020-08-06 DIAGNOSIS — I25118 Atherosclerotic heart disease of native coronary artery with other forms of angina pectoris: Secondary | ICD-10-CM

## 2020-08-06 DIAGNOSIS — E118 Type 2 diabetes mellitus with unspecified complications: Secondary | ICD-10-CM

## 2020-08-06 DIAGNOSIS — E785 Hyperlipidemia, unspecified: Secondary | ICD-10-CM

## 2020-08-06 DIAGNOSIS — I255 Ischemic cardiomyopathy: Secondary | ICD-10-CM

## 2020-08-06 DIAGNOSIS — Z720 Tobacco use: Secondary | ICD-10-CM | POA: Diagnosis not present

## 2020-08-06 DIAGNOSIS — Z794 Long term (current) use of insulin: Secondary | ICD-10-CM

## 2020-08-06 NOTE — Progress Notes (Signed)
Office Visit    Patient Name: Lawrence Holmes Date of Encounter: 08/06/2020  Primary Care Provider:  Delsa Grana, PA-C Primary Cardiologist:  Kathlyn Sacramento, MD Electrophysiologist:  None   Chief Complaint    Lawrence Holmes is a 64 y.o. 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  Past Medical History    Past Medical History:  Diagnosis Date  . Chronic systolic heart failure ([place]) 07/2017   a. 07/2017 Echo: EF 30-35%, Gr1 DD, inflat, and inf AK, mild to mod MR, mildly dil LA/RA; b. 10/2017 Echo: EF 40-45%, basal-midinf and infsept AK. Mild LVH. Mild MR. Nl RV fxn.  . Coronary artery disease 07/2017   a. 07/2017 Late presenting inferior STEMI/Cath: LM nl, LAD min irregs, LCX 100d (L->L collats), RCA 100p (L->R collats), EF 25-35%-->Med Rx; b. 10/2017 ETT (DOT): Ex time 7:38, baseline antlat TWI, no acute changes.   . Diabetes mellitus without complication ([place])   . Hyperlipidemia   . Ischemic cardiomyopathy    a. 07/2017 Echo: EF 30-35%; b. 10/2017 Echo: EF 40-45%.  . Tobacco use    Past Surgical History:  Procedure Laterality Date  . COLONOSCOPY WITH PROPOFOL N/A 04/04/2019   Procedure: COLONOSCOPY WITH PROPOFOL;  Surgeon: Virgel Manifold, MD;  Location: [facility] ENDOSCOPY;  Service: Endoscopy;  Laterality: N/A;  . EYE SURGERY    . LEFT HEART CATH AND CORONARY ANGIOGRAPHY N/A 07/21/2017   Procedure: LEFT HEART CATH AND CORONARY ANGIOGRAPHY;  Surgeon: Wellington Hampshire, MD;  Location: [place] [facility];  Service: Cardiovascular;  Laterality: N/A;    Allergies  No Known Allergies  History of Present Illness    Lawrence Holmes is a 64 y.o. male with a hx of  coronary artery disease as/P 07/2017 STEMI treated medically due to late presentation and  total occlusion of RCA with collaterals, HLD, HFrEF due to ischemic cardiomyopathy, DM2, tobacco use  last seen 03/18/2020 by Dr. Fletcher Anon..  Coronary artery disease dates back to September 2018 when he presented with inferior STEMI with late presentation.  Cath showed severe two-vessel coronary disease with total occlusion of proximal RCA with left-to-right collaterals and occluded distal LCx with collaterals.  No obstructive disease in the LAD.  Culprit felt to be RCA.  EF 25-30% with inferior wall akinesis.  Dr. Fletcher Anon probed the right coronary artery with a wire and was unable to cross easily and thus no further intervention performed.  Echo with LVEF 30 to 35% with mild to moderate mitral regurgitation.  He was treated medically.  He had a repeat echocardiogram 10/2017 with LVEF improved to 40 to 45% with akinesis of inferior and inferior septal myocardium.  He works as a Conservator, museum/gallery and as such had treadmill stress test 09/2019 with no evidence of ischemia.  He has exercised for 6 minutes and 49 seconds corresponding to 8.2 METS.  He presents today for follow-up.  He reports feeling overall well.  Endorses eating a low-sodium, heart healthy diet and cooks most of his food at home.  No formal exercise routine though trying to get back into some walking.  He tells me that his DOT is due for renewal in March.  Reports no shortness of breath nor dyspnea on exertion. Reports no chest pain, pressure, or tightness. No edema,  orthopnea, PND. Reports no palpitations.   EKGs/Labs/Other Studies Reviewed:   The following studies were reviewed today:   EKG:  EKG is ordered today.  The ekg ordered today demonstrates SR 75 bpm with stable prior inferior infarct. No acute St/T wave changes today.   Recent Labs: 01/30/2020: Hemoglobin 16.2; Platelets 252 07/30/2020: ALT 34; BUN 16; Creat 1.02; Potassium 4.3; Sodium 141  Recent Lipid Panel    Component Value Date/Time   CHOL 105 01/30/2020 1529   TRIG  85 01/30/2020 1529   HDL 27 (L) 01/30/2020 1529   CHOLHDL 3.9 01/30/2020 1529   VLDL 13 09/12/2017 1559   LDLCALC 61 01/30/2020 1529    Home Medications   Current Meds  Medication Sig  . aspirin 81 MG chewable tablet Chew 1 tablet (81 mg total) by mouth daily.  [name] atorvastatin (LIPITOR) 80 MG tablet TAKE 1 TABLET (80 MG TOTAL) BY MOUTH DAILY AT 6 PM.  . Blood Gluc Meter Disp-Strips (BLOOD GLUCOSE METER DISPOSABLE) DEVI Check fingerstick blood sugars once a day; E11.69, LON 99 months  . carvedilol (COREG) 3.125 MG tablet TAKE 1 TABLET (3.125 MG TOTAL) BY MOUTH 2 (TWO) TIMES DAILY WITH A MEAL.  [name] empagliflozin (JARDIANCE) 10 MG TABS tablet Take 1 tablet (10 mg total) by mouth daily before breakfast.  . ENTRESTO 24-26 MG TAKE 1 TABLET BY MOUTH 2 (TWO) TIMES DAILY.  . nitroGLYCERIN (NITROSTAT) 0.4 MG SL tablet Place 1 tablet (0.4 mg total) under the tongue every 5 (five) minutes as needed for chest pain.  [name] spironolactone (ALDACTONE) 25 MG tablet TAKE 1 TABLET BY MOUTH EVERY DAY  . terbinafine (LAMISIL) 250 MG tablet Take 1 tablet (250 mg total) by mouth daily.     Review of Systems      Review of Systems  Constitutional: Negative for chills, fever and malaise/fatigue.  Cardiovascular: Negative for chest pain, dyspnea on exertion, irregular heartbeat, leg swelling, near-syncope, orthopnea, palpitations and syncope.  Respiratory: Negative for cough, shortness of breath and wheezing.   Gastrointestinal: Negative for melena, nausea and vomiting.  Genitourinary: Negative for hematuria.  Neurological: Negative for dizziness, light-headedness and weakness.   All other systems reviewed and are otherwise negative except as noted above.  Physical Exam    VS:  BP 128/70 (BP Location: Left Arm, Patient Position: Sitting, Cuff Size: Normal)   Pulse 75   Ht 6' (1.829 m)   Wt 217 lb 6 oz (98.6 kg)   SpO2 98%   BMI 29.48 kg/m  , BMI Body mass index is 29.48 kg/m.  Wt Readings from Last 3  Encounters:  08/06/20 217 lb 6 oz (98.6 kg)  07/30/20 214 lb 12.8 oz (97.4 kg)  03/18/20 (!) 217 lb (98.4 kg)    GEN: Well nourished, overweight, well developed, in no acute distress. HEENT: normal. Neck: Supple, no JVD, carotid bruits, or masses. Cardiac: RRR, no murmurs, rubs, or gallops. No clubbing, cyanosis, edema.  Radials/DP/PT 2+ and equal bilaterally.  Respiratory:  Respirations regular and unlabored, clear to auscultation bilaterally. GI: Soft, nontender, nondistended. MS: No deformity or atrophy. Skin: Warm and dry, no rash. Neuro:  Strength and sensation are intact. Psych: Normal affect.  Assessment & Plan    1. CAD s/p STEMI 2018 with known total occlusion of the RCA with left-to-right collaterals and occluded distal left circumflex with collateral-stable with no anginal symptoms.  EKG today without acute ST/T wave changes.  No negation for ischemic evaluation at this time.  ETT 09/2019 was normal.  Continue GDMT including aspirin, beta-blocker, statin.  Provided letter today with clearance for DOT.  2. HFrEF/ischemic cardiomyopathy-echo 2018 with LVEF 40-45%.  Reports no dyspnea, orthopnea, PND, edema.  GDMT includes Coreg, Jardiance, Entresto, Spironolactone.  NYHA I.  Continue low-salt diet, regular cardiovascular exercise.  3. HLD, LDL goal less than 70- 07/30/20 normal liver function. 01/20/20 LDL 61.  Continue atorvastatin 80 mg daily.  4. DM2- 07/30/20 A1c 7.0.  Continue Jardiance 10 mg daily as prescribed by PCP.  Appreciate inclusion of SGLT2 I.  5. Tobacco use- Smoking cessation encouraged. Recommend utilization of 1800QUITNOW.  Provided with handout on methods for coping with quitting smoking  Disposition: Follow up in 6 month(s) with Dr. Fletcher Anon or APP.  Signed, Loel Dubonnet, NP 08/06/2020, 11:44 AM [place]

## 2020-08-06 NOTE — Patient Instructions (Addendum)
Medication Instructions:  Continue your current medications.   *If you need a refill on your cardiac medications before your next appointment, please call your pharmacy*   Lab Work: None ordered today.   Testing/Procedures: Your EKG today shows normal sinus rhythm which is a good result!  Follow-Up: At [facility], you and your health needs are our priority.  As part of our continuing mission to provide you with exceptional heart care, we have created designated Provider Care Teams.  These Care Teams include your primary Cardiologist (physician) and Advanced Practice Providers (APPs -  Physician Assistants and Nurse Practitioners) who all work together to provide you with the care you need, when you need it.  We recommend signing up for the patient portal called "MyChart".  Sign up information is provided on this After Visit Summary.  MyChart is used to connect with patients for Virtual Visits (Telemedicine).  Patients are able to view lab/test results, encounter notes, upcoming appointments, etc.  Non-urgent messages can be sent to your provider as well.   To learn more about what you can do with MyChart, go to [email or web address].    Your next appointment:   6 month(s)  The format for your next appointment:   In Person  Provider:   You may see Kathlyn Sacramento, MD or one of the following Advanced Practice Providers on your designated Care Team:    Murray Hodgkins, NP  Christell Faith, PA-C  Marrianne Mood, PA-C  Cadence Kathlen Mody, [place]  Laurann Montana, NP  Other Instructions  Heart-Healthy Eating Plan Heart-healthy meal planning includes:  Eating less unhealthy fats.  Eating more healthy fats.  Making other changes in your diet. Talk with your doctor or a diet specialist (dietitian) to create an eating plan that is right for you. What are tips for following this plan? Cooking Avoid frying your food. Try to bake, boil, grill, or broil it instead. You can also reduce  fat by:  Removing the skin from poultry.  Removing all visible fats from meats.  Steaming vegetables in water or broth. Meal planning   At meals, divide your plate into four equal parts: ? Fill one-half of your plate with vegetables and green salads. ? Fill one-fourth of your plate with whole grains. ? Fill one-fourth of your plate with lean protein foods.  Eat 4-5 servings of vegetables per day. A serving of vegetables is: ? 1 cup of raw or cooked vegetables. ? 2 cups of raw leafy greens.  Eat 4-5 servings of fruit per day. A serving of fruit is: ? 1 medium whole fruit. ?  cup of dried fruit. ?  cup of fresh, frozen, or canned fruit. ?  cup of 100% fruit juice.  Eat more foods that have soluble fiber. These are apples, broccoli, carrots, beans, peas, and barley. Try to get 20-30 g of fiber per day.  Eat 4-5 servings of nuts, legumes, and seeds per week: ? 1 serving of dried beans or legumes equals  cup after being cooked. ? 1 serving of nuts is  cup. ? 1 serving of seeds equals 1 tablespoon. General information  Eat more home-cooked food. Eat less restaurant, buffet, and fast food.  Limit or avoid alcohol.  Limit foods that are high in starch and sugar.  Avoid fried foods.  Lose weight if you are overweight.  Keep track of how much salt (sodium) you eat. This is important if you have high blood pressure. Ask your doctor to tell you more about this.  Try to add vegetarian meals each week. Fats  Choose healthy fats. These include olive oil and canola oil, flaxseeds, walnuts, almonds, and seeds.  Eat more omega-3 fats. These include salmon, mackerel, sardines, tuna, flaxseed oil, and ground flaxseeds. Try to eat fish at least 2 times each week.  Check food labels. Avoid foods with trans fats or high amounts of saturated fat.  Limit saturated fats. ? These are often found in animal products, such as meats, butter, and cream. ? These are also found in plant  foods, such as palm oil, palm kernel oil, and coconut oil.  Avoid foods with partially hydrogenated oils in them. These have trans fats. Examples are stick margarine, some tub margarines, cookies, crackers, and other baked goods. What foods can I eat? Fruits All fresh, canned (in natural juice), or frozen fruits. Vegetables Fresh or frozen vegetables (raw, steamed, roasted, or grilled). Green salads. Grains Most grains. Choose whole wheat and whole grains most of the time. Rice and pasta, including brown rice and pastas made with whole wheat. Meats and other proteins Lean, well-trimmed beef, veal, pork, and lamb. Chicken and [place] without skin. All fish and shellfish. Wild duck, rabbit, pheasant, and venison. Egg whites or low-cholesterol egg substitutes. Dried beans, peas, lentils, and tofu. Seeds and most nuts. Dairy Low-fat or nonfat cheeses, including ricotta and mozzarella. Skim or 1% milk that is liquid, powdered, or evaporated. Buttermilk that is made with low-fat milk. Nonfat or low-fat yogurt. Fats and oils Non-hydrogenated (trans-free) margarines. Vegetable oils, including soybean, sesame, sunflower, olive, peanut, safflower, corn, canola, and cottonseed. Salad dressings or mayonnaise made with a vegetable oil. Beverages Mineral water. Coffee and tea. Diet carbonated beverages. Sweets and desserts Sherbet, gelatin, and fruit ice. Small amounts of dark chocolate. Limit all sweets and desserts. Seasonings and condiments All seasonings and condiments. The items listed above may not be a complete list of foods and drinks you can eat. Contact a dietitian for more options. What foods should I avoid? Fruits Canned fruit in heavy syrup. Fruit in cream or butter sauce. Fried fruit. Limit coconut. Vegetables Vegetables cooked in cheese, cream, or butter sauce. Fried vegetables. Grains Breads that are made with saturated or trans fats, oils, or whole milk. Croissants. Sweet rolls.  Donuts. High-fat crackers, such as cheese crackers. Meats and other proteins Fatty meats, such as hot dogs, ribs, sausage, bacon, rib-eye roast or steak. High-fat deli meats, such as salami and bologna. Caviar. Domestic duck and goose. Organ meats, such as liver. Dairy Cream, sour cream, cream cheese, and creamed cottage cheese. Whole-milk cheeses. Whole or 2% milk that is liquid, evaporated, or condensed. Whole buttermilk. Cream sauce or high-fat cheese sauce. Yogurt that is made from whole milk. Fats and oils Meat fat, or shortening. Cocoa butter, hydrogenated oils, palm oil, coconut oil, palm kernel oil. Solid fats and shortenings, including bacon fat, salt pork, lard, and butter. Nondairy cream substitutes. Salad dressings with cheese or sour cream. Beverages Regular sodas and juice drinks with added sugar. Sweets and desserts Frosting. Pudding. Cookies. Cakes. Pies. Milk chocolate or white chocolate. Buttered syrups. Full-fat ice cream or ice cream drinks. The items listed above may not be a complete list of foods and drinks to avoid. Contact a dietitian for more information. Summary  Heart-healthy meal planning includes eating less unhealthy fats, eating more healthy fats, and making other changes in your diet.  Eat a balanced diet. This includes fruits and vegetables, low-fat or nonfat dairy, lean protein, nuts and legumes, whole  grains, and heart-healthy oils and fats. This information is not intended to replace advice given to you by your health care provider. Make sure you discuss any questions you have with your health care provider. Document Revised: 10/11/2017 Document Reviewed: 09/14/2017 Elsevier Patient Education  2020 [place] with Quitting Smoking  Quitting smoking is a physical and mental challenge. You will face cravings, withdrawal symptoms, and temptation. Before quitting, work with your health care provider to make a plan that can help you cope.  Preparation can help you quit and keep you from giving in. How can I cope with cravings? Cravings usually last for 5-10 minutes. If you get through it, the craving will pass. Consider taking the following actions to help you cope with cravings:  Keep your mouth busy: ? Chew sugar-free gum. ? Suck on hard candies or a straw. ? Brush your teeth.  Keep your hands and body busy: ? Immediately change to a different activity when you feel a craving. ? Squeeze or play with a ball. ? Do an activity or a hobby, like making bead jewelry, practicing needlepoint, or working with wood. ? Mix up your normal routine. ? Take a short exercise break. Go for a quick walk or run up and down stairs. ? Spend time in public places where smoking is not allowed.  Focus on doing something kind or helpful for someone else.  Call a friend or family member to talk during a craving.  Join a support group.  Call a quit line, such as 1-800-QUIT-NOW.  Talk with your health care provider about medicines that might help you cope with cravings and make quitting easier for you. How can I deal with withdrawal symptoms? Your body may experience negative effects as it tries to get used to not having nicotine in the system. These effects are called withdrawal symptoms. They may include:  Feeling hungrier than normal.  Trouble concentrating.  Irritability.  Trouble sleeping.  Feeling depressed.  Restlessness and agitation.  Craving a cigarette. To manage withdrawal symptoms:  Avoid places, people, and activities that trigger your cravings.  Remember why you want to quit.  Get plenty of sleep.  Avoid coffee and other caffeinated drinks. These may worsen some of your symptoms. How can I handle social situations? Social situations can be difficult when you are quitting smoking, especially in the first few weeks. To manage this, you can:  Avoid parties, bars, and other social situations where people might be  smoking.  Avoid alcohol.  Leave right away if you have the urge to smoke.  Explain to your family and friends that you are quitting smoking. Ask for understanding and support.  Plan activities with friends or family where smoking is not an option. What are some ways I can cope with stress? Wanting to smoke may cause stress, and stress can make you want to smoke. Find ways to manage your stress. Relaxation techniques can help. For example:  Breathe slowly and deeply, in through your nose and out through your mouth.  Listen to soothing, relaxing music.  Talk with a family member or friend about your stress.  Light a candle.  Soak in a bath or take a shower.  Think about a peaceful place. What are some ways I can prevent weight gain? Be aware that many people gain weight after they quit smoking. However, not everyone does. To keep from gaining weight, have a plan in place before you quit and stick to the plan after  you quit. Your plan should include:  Having healthy snacks. When you have a craving, it may help to: ? Eat plain popcorn, crunchy carrots, celery, or other cut vegetables. ? Chew sugar-free gum.  Changing how you eat: ? Eat small portion sizes at meals. ? Eat 4-6 small meals throughout the day instead of 1-2 large meals a day. ? Be mindful when you eat. Do not watch television or do other things that might distract you as you eat.  Exercising regularly: ? Make time to exercise each day. If you do not have time for a long workout, do short bouts of exercise for 5-10 minutes several times a day. ? Do some form of strengthening exercise, like weight lifting, and some form of aerobic exercise, like running or swimming.  Drinking plenty of water or other low-calorie or no-calorie drinks. Drink 6-8 glasses of water daily, or as much as instructed by your health care provider. Summary  Quitting smoking is a physical and mental challenge. You will face cravings, withdrawal  symptoms, and temptation to smoke again. Preparation can help you as you go through these challenges.  You can cope with cravings by keeping your mouth busy (such as by chewing gum), keeping your body and hands busy, and making calls to family, friends, or a helpline for people who want to quit smoking.  You can cope with withdrawal symptoms by avoiding places where people smoke, avoiding drinks with caffeine, and getting plenty of rest.  Ask your health care provider about the different ways to prevent weight gain, avoid stress, and handle social situations. This information is not intended to replace advice given to you by your health care provider. Make sure you discuss any questions you have with your health care provider. Document Revised: 07/20/2017 Document Reviewed: 08/04/2016 Elsevier Patient Education  2020 [employer].

## 2020-08-31 ENCOUNTER — Other Ambulatory Visit: Payer: Self-pay | Admitting: Cardiovascular Disease

## 2020-09-01 ENCOUNTER — Other Ambulatory Visit: Payer: Self-pay | Admitting: Cardiovascular Disease

## 2020-09-02 ENCOUNTER — Other Ambulatory Visit: Payer: Self-pay | Admitting: Cardiovascular Disease

## 2021-01-28 ENCOUNTER — Ambulatory Visit: Payer: 59 | Admitting: Family Medicine

## 2021-02-15 ENCOUNTER — Ambulatory Visit: Payer: 59 | Admitting: Unknown Physician Specialty

## 2021-02-18 ENCOUNTER — Ambulatory Visit: Payer: 59 | Admitting: Physician Assistant

## 2021-03-05 ENCOUNTER — Other Ambulatory Visit: Payer: Self-pay | Admitting: Cardiovascular Disease

## 2021-03-10 ENCOUNTER — Other Ambulatory Visit: Payer: Self-pay

## 2021-03-10 ENCOUNTER — Ambulatory Visit (INDEPENDENT_AMBULATORY_CARE_PROVIDER_SITE_OTHER): Payer: 59 | Admitting: Cardiovascular Disease

## 2021-03-10 ENCOUNTER — Encounter: Payer: Self-pay | Admitting: Cardiovascular Disease

## 2021-03-10 VITALS — BP 110/62 | HR 72 | Ht 72.0 in | Wt 209.5 lb

## 2021-03-10 DIAGNOSIS — I2510 Atherosclerotic heart disease of native coronary artery without angina pectoris: Secondary | ICD-10-CM

## 2021-03-10 DIAGNOSIS — E785 Hyperlipidemia, unspecified: Secondary | ICD-10-CM

## 2021-03-10 DIAGNOSIS — E119 Type 2 diabetes mellitus without complications: Secondary | ICD-10-CM | POA: Diagnosis not present

## 2021-03-10 DIAGNOSIS — I5022 Chronic systolic (congestive) heart failure: Secondary | ICD-10-CM

## 2021-03-10 NOTE — Patient Instructions (Signed)
Medication Instructions:  Your physician recommends that you continue on your current medications as directed. Please refer to the Current Medication list given to you today.  Nitroglycerin has been removed form your medication list.  *If you need a refill on your cardiac medications before your next appointment, please call your pharmacy*   Lab Work: None ordered  If you have labs (blood work) drawn today and your tests are completely normal, you will receive your results only by: [place] (if you have MyChart) OR A paper copy in the mail If you have any lab test that is abnormal or we need to change your treatment, we will call you to review the results.   Testing/Procedures: None ordered   Follow-Up: At [facility], you and your health needs are our priority.  As part of our continuing mission to provide you with exceptional heart care, we have created designated Provider Care Teams.  These Care Teams include your primary Cardiologist (physician) and Advanced Practice Providers (APPs -  Physician Assistants and Nurse Practitioners) who all work together to provide you with the care you need, when you need it.  We recommend signing up for the patient portal called "MyChart".  Sign up information is provided on this After Visit Summary.  MyChart is used to connect with patients for Virtual Visits (Telemedicine).  Patients are able to view lab/test results, encounter notes, upcoming appointments, etc.  Non-urgent messages can be sent to your provider as well.   To learn more about what you can do with MyChart, go to [email or web address].    Your next appointment:   Your physician wants you to follow-up in: 6 months You will receive a reminder letter in the mail two months in advance. If you don't receive a letter, please call our office to schedule the follow-up appointment.   The format for your next appointment:   In Person  Provider:   You may see Kathlyn [place], MD  or one of the following Advanced Practice Providers on your designated Care Team:   Murray Hodgkins, NP Christell Faith, PA-C Marrianne Mood, PA-C Cadence Kathlen Mody, [place]   Other Instructions N/A

## 2021-03-10 NOTE — Progress Notes (Signed)
Cardiology Office Note   Date:  03/10/2021   ID:  Lawrence Holmes, DOB [birth year], MRN [id number]  PCP:  Delsa Grana, PA-C  Cardiologist:   Kathlyn Sacramento, MD   Chief Complaint  Patient presents with   Other    6 month f/u no complaints today. Meds reviewed verbally with pt.      History of Present Illness: Lawrence Holmes is a 65 y.o. male who presents for a follow-up visit regarding coronary artery disease.   He presented in December 2018 with inferior ST elevation myocardial infarction with late presentation. Cardiac catheterization which severe two-vessel coronary artery disease with total occlusion of the proximal right coronary artery with left-to-right collaterals and occluded distal left circumflex with collaterals.  No obstructive disease affecting the LAD.  The culprit was felt to be the right coronary artery.  Ejection fraction was 25-30% with inferior wall akinesis.  I probed the right coronary artery with a wire and was not able to cross easily and thus no further intervention was performed.  Echocardiogram showed an EF of 30-35% with mild to moderate mitral regurgitation.  The patient was treated medically.  Repeat echocardiogram in March 2019 showed improvement in EF to 40-45% with akinesis of inferior and inferoseptal myocardium. He works as a Forensic scientist.   He had a treadmill stress test done in February of 2021 which showed no evidence of ischemia. He exercised for 6 minutes and 49 seconds corresponding to 8.2 METS.  He has been doing very well and denies any chest pain, shortness of breath or palpitations.  He has occasional dizziness when standing up quickly.  He takes his medications regularly.  He continues to walk.  He does complain of erectile dysfunction and wonders if he is able to use medications.   Past Medical History:  Diagnosis Date   Chronic systolic heart failure ([place]) 07/2017   a. 07/2017 Echo: EF 30-35%, Gr1 DD, inflat, and inf AK, mild  to mod MR, mildly dil LA/RA; b. 10/2017 Echo: EF 40-45%, basal-midinf and infsept AK. Mild LVH. Mild MR. Nl RV fxn.   Coronary artery disease 07/2017   a. 07/2017 Late presenting inferior STEMI/Cath: LM nl, LAD min irregs, LCX 100d (L->L collats), RCA 100p (L->R collats), EF 25-35%-->Med Rx; b. 10/2017 ETT (DOT): Ex time 7:38, baseline antlat TWI, no acute changes.    Diabetes mellitus without complication ([place])    Hyperlipidemia    Ischemic cardiomyopathy    a. 07/2017 Echo: EF 30-35%; b. 10/2017 Echo: EF 40-45%.   Tobacco use     Past Surgical History:  Procedure Laterality Date   COLONOSCOPY WITH PROPOFOL N/A 04/04/2019   Procedure: COLONOSCOPY WITH PROPOFOL;  Surgeon: Virgel Manifold, MD;  Location: [facility] ENDOSCOPY;  Service: Endoscopy;  Laterality: N/A;   EYE SURGERY     LEFT HEART CATH AND CORONARY ANGIOGRAPHY N/A 07/21/2017   Procedure: LEFT HEART CATH AND CORONARY ANGIOGRAPHY;  Surgeon: Wellington Hampshire, MD;  Location: [place] [facility];  Service: Cardiovascular;  Laterality: N/A;     Current Outpatient Medications  Medication Sig Dispense Refill   aspirin 81 MG chewable tablet Chew 1 tablet (81 mg total) by mouth daily. 30 tablet 2   atorvastatin (LIPITOR) 80 MG tablet TAKE 1 TABLET BY MOUTH DAILY AT 6 PM. 90 tablet 0   Blood Gluc Meter Disp-Strips (BLOOD GLUCOSE METER DISPOSABLE) DEVI Check fingerstick blood sugars once a day; E11.69, LON 99 months 100 each 1   carvedilol (  COREG) 3.125 MG tablet TAKE 1 TABLET (3.125 MG TOTAL) BY MOUTH 2 (TWO) TIMES DAILY WITH A MEAL. 180 tablet 0   empagliflozin (JARDIANCE) 10 MG TABS tablet Take 1 tablet (10 mg total) by mouth daily before breakfast. 90 tablet 3   ENTRESTO 24-26 MG TAKE 1 TABLET BY MOUTH 2 (TWO) TIMES DAILY. 60 tablet 5   nitroGLYCERIN (NITROSTAT) 0.4 MG SL tablet Place 1 tablet (0.4 mg total) under the tongue every 5 (five) minutes as needed for chest pain. 30 tablet 2   spironolactone (ALDACTONE) 25 MG tablet TAKE 1  TABLET BY MOUTH EVERY DAY 90 tablet 3   terbinafine (LAMISIL) 250 MG tablet Take 1 tablet (250 mg total) by mouth daily. 90 tablet 1   No current facility-administered medications for this visit.    Allergies:   Patient has no known allergies.    Social History:  The patient  reports that he has been smoking cigarettes. He has a 12.50 pack-year smoking history. He has never used smokeless tobacco. He reports current alcohol use. He reports that he does not use drugs.   Family History:  The patient's family history includes Heart attack in his sister; Heart disease in his mother; Hyperlipidemia in his mother; Hypertension in his mother.    ROS:  Please see the history of present illness.   Otherwise, review of systems are positive for none.   All other systems are reviewed and negative.    PHYSICAL EXAM: VS:  BP 110/62 (BP Location: Left Arm, Patient Position: Sitting, Cuff Size: Normal)   Pulse 72   Ht 6' (1.829 m)   Wt 209 lb 8 oz (95 kg)   SpO2 98%   BMI 28.41 kg/m  , BMI Body mass index is 28.41 kg/m. GEN: Well nourished, well developed, in no acute distress  HEENT: normal  Neck: no JVD, carotid bruits, or masses Cardiac: RRR; no murmurs, rubs, or gallops,no edema  Respiratory:  clear to auscultation bilaterally, normal work of breathing GI: soft, nontender, nondistended, + BS MS: no deformity or atrophy  Skin: warm and dry, no rash Neuro:  Strength and sensation are intact Psych: euthymic mood, full affect   EKG:  EKG is ordered today. EKG showed normal sinus rhythm with old inferior infarct.  No significant ST or T wave changes.  Recent Labs: 07/30/2020: ALT 34; BUN 16; Creat 1.02; Potassium 4.3; Sodium 141    Lipid Panel    Component Value Date/Time   CHOL 105 01/30/2020 1529   TRIG 85 01/30/2020 1529   HDL 27 (L) 01/30/2020 1529   CHOLHDL 3.9 01/30/2020 1529   VLDL 13 09/12/2017 1559   LDLCALC 61 01/30/2020 1529      Wt Readings from Last 3 Encounters:   03/10/21 209 lb 8 oz (95 kg)  08/06/20 217 lb 6 oz (98.6 kg)  07/30/20 214 lb 12.8 oz (97.4 kg)      No flowsheet data found.    ASSESSMENT AND PLAN:  1.  Coronary artery disease involving native coronary arteries without angina: He is doing well overall with no anginal symptoms.    Continue aspirin indefinitely. He had a treadmill stress test earlier this year which was negative for ischemia.  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  2.  Chronic systolic heart failure due to ischemic cardiomyopathy: He appears to be euvolemic.  Most recent ejection fraction improved to 40-45% on medical therapy.  Continue carvedilol, Entresto and spironolactone.  He is  also on Jardiance.  He will need follow-up labs within the next 6 months.  3.  Hyperlipidemia: Continue high-dose atorvastatin.  Most recent lipid profile showed an LDL of 61.  5.  Diabetes mellitus: Currently on Jardiance  6.  Erectile dysfunction: The patient does not use nitroglycerin.  No contraindication for treatment with a phosphodiesterase inhibitor such as sildenafil from a cardiac standpoint.   Disposition:   FU with me in 6 months  Signed,  Kathlyn [place], MD  03/10/2021 8:05 AM    [place]

## 2021-03-19 ENCOUNTER — Other Ambulatory Visit: Payer: Self-pay | Admitting: Cardiovascular Disease

## 2021-04-11 ENCOUNTER — Other Ambulatory Visit: Payer: Self-pay | Admitting: Family Medicine

## 2021-04-11 DIAGNOSIS — E1169 Type 2 diabetes mellitus with other specified complication: Secondary | ICD-10-CM

## 2021-04-14 ENCOUNTER — Other Ambulatory Visit: Payer: Self-pay | Admitting: Cardiovascular Disease

## 2021-05-19 ENCOUNTER — Ambulatory Visit: Payer: 59 | Admitting: Family Medicine

## 2021-05-19 ENCOUNTER — Encounter: Payer: Self-pay | Admitting: Family Medicine

## 2021-05-19 ENCOUNTER — Other Ambulatory Visit: Payer: Self-pay

## 2021-05-19 VITALS — BP 132/68 | HR 90 | Temp 98.2°F | Resp 16 | Ht 72.0 in | Wt 213.3 lb

## 2021-05-19 DIAGNOSIS — Z5181 Encounter for therapeutic drug level monitoring: Secondary | ICD-10-CM

## 2021-05-19 DIAGNOSIS — L84 Corns and callosities: Secondary | ICD-10-CM

## 2021-05-19 DIAGNOSIS — E785 Hyperlipidemia, unspecified: Secondary | ICD-10-CM | POA: Diagnosis not present

## 2021-05-19 DIAGNOSIS — I255 Ischemic cardiomyopathy: Secondary | ICD-10-CM

## 2021-05-19 DIAGNOSIS — B351 Tinea unguium: Secondary | ICD-10-CM

## 2021-05-19 DIAGNOSIS — I25118 Atherosclerotic heart disease of native coronary artery with other forms of angina pectoris: Secondary | ICD-10-CM | POA: Insufficient documentation

## 2021-05-19 DIAGNOSIS — E1169 Type 2 diabetes mellitus with other specified complication: Secondary | ICD-10-CM

## 2021-05-19 DIAGNOSIS — N529 Male erectile dysfunction, unspecified: Secondary | ICD-10-CM

## 2021-05-19 DIAGNOSIS — I5022 Chronic systolic (congestive) heart failure: Secondary | ICD-10-CM | POA: Diagnosis not present

## 2021-05-19 DIAGNOSIS — Z720 Tobacco use: Secondary | ICD-10-CM

## 2021-05-19 DIAGNOSIS — Z7902 Long term (current) use of antithrombotics/antiplatelets: Secondary | ICD-10-CM

## 2021-05-19 MED ORDER — ATORVASTATIN CALCIUM 80 MG PO TABS: 80.0000 mg | ORAL_TABLET | Freq: Every day | ORAL | 3 refills | Status: DC

## 2021-05-19 MED ORDER — SILDENAFIL CITRATE 20 MG PO TABS: 20.0000 mg | ORAL_TABLET | Freq: Every day | ORAL | 0 refills | Status: DC | PRN

## 2021-05-19 NOTE — Progress Notes (Signed)
Name: Lawrence Holmes   MRN: [id number]    DOB: 1955/11/27   Date:05/19/2021       Progress Note  Chief Complaint  Patient presents with   Diabetes   Hyperlipidemia     Subjective:   Lawrence Holmes is a 65 y.o. male, presents to clinic for routine f/up  DM:   Pt managing DM with jardiance  Reports good med compliance Pt has no SE from meds. Blood sugars not checking often  Denies: Polyuria, polydipsia, vision changes, neuropathy, hypoglycemia Recent pertinent labs: Lab Results  Component Value Date   HGBA1C 7.0 (A) 07/30/2020   HGBA1C 6.5 (H) 01/30/2020   HGBA1C 6.6 (H) 06/10/2019   Lab Results  Component Value Date   MICROALBUR 0.4 07/30/2020   LDLCALC 61 01/30/2020   CREATININE 1.02 07/30/2020   Standard of care and health maintenance: Foot exam:  due Dec DM eye exam:  due ACEI/ARB:  entresto/ARB Statin:  lipitor 80   Hypertension:  Currently managed on entresto, spironolactone 25 mg, coreg 3.125  Pt reports good med compliance and denies any SE.   Blood pressure today is well controlled. BP Readings from Last 3 Encounters:  05/19/21 132/68  03/10/21 110/62  08/06/20 128/70   Pt denies CP, SOB, exertional sx, LE edema, palpitation, Ha's, visual disturbances, lightheadedness, hypotension, syncope.   Last cardilogy visit reviewed: ASSESSMENT AND PLAN:   1.  Coronary artery disease involving native coronary arteries without angina: He is doing well overall with no anginal symptoms.    Continue aspirin indefinitely. He had a treadmill stress test earlier this year which was negative for ischemia.  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   2.  Chronic systolic heart failure due to ischemic cardiomyopathy: He appears to be euvolemic.  Most recent ejection fraction improved to 40-45% on medical therapy.  Continue carvedilol, Entresto and spironolactone.  He is also on Jardiance.  He will need follow-up labs within the next 6 months.   3.   Hyperlipidemia: Continue high-dose atorvastatin.  Most recent lipid profile showed an LDL of 61.   5.  Diabetes mellitus: Currently on Jardiance   6.  Erectile dysfunction: The patient does not use nitroglycerin.  No contraindication for treatment with a phosphodiesterase inhibitor such as sildenafil from a cardiac standpoint.     Disposition:   FU with me in 6 months   Signed,   Kathlyn Sacramento, MD    ED - no contraindication from cardiology - will discuss with pt today    Hyperlipidemia:   Currently treated with lipitor 80 mg, pt reports good med compliance Last Lipids: Lab Results  Component Value Date   CHOL 105 01/30/2020   HDL 27 (L) 01/30/2020   LDLCALC 61 01/30/2020   TRIG 85 01/30/2020   CHOLHDL 3.9 01/30/2020   - Denies: Chest pain, shortness of breath, myalgias, claudication     Current Outpatient Medications:    aspirin 81 MG chewable tablet, Chew 1 tablet (81 mg total) by mouth daily., Disp: 30 tablet, Rfl: 2   atorvastatin (LIPITOR) 80 MG tablet, TAKE 1 TABLET BY MOUTH DAILY AT 6 PM., Disp: 90 tablet, Rfl: 0   Blood Gluc Meter Disp-Strips (BLOOD GLUCOSE METER DISPOSABLE) DEVI, Check fingerstick blood sugars once a day; E11.69, LON 99 months, Disp: 100 each, Rfl: 1   carvedilol (COREG) 3.125 MG tablet, TAKE 1 TABLET (3.125 MG TOTAL) BY MOUTH 2 (TWO) TIMES DAILY WITH A MEAL., Disp: 180 tablet,  Rfl: 0   ENTRESTO 24-26 MG, TAKE 1 TABLET BY MOUTH TWICE A DAY, Disp: 60 tablet, Rfl: 5   JARDIANCE 10 MG TABS tablet, TAKE 1 TABLET (10 MG TOTAL) BY MOUTH DAILY BEFORE BREAKFAST., Disp: 90 tablet, Rfl: 3   spironolactone (ALDACTONE) 25 MG tablet, TAKE 1 TABLET BY MOUTH EVERY DAY, Disp: 90 tablet, Rfl: 2   terbinafine (LAMISIL) 250 MG tablet, Take 1 tablet (250 mg total) by mouth daily., Disp: 90 tablet, Rfl: 1  Patient Active Problem List   Diagnosis Date Noted   Dyslipidemia 07/30/2020   Polyp of colon    Diverticulosis of large intestine without diverticulitis     Low HDL (under 40) 05/13/2018   Type 2 diabetes mellitus, controlled ([place]) 02/08/2018   History of myocardial infarction 11/08/2017   Tobacco use 11/08/2017   Ischemic cardiomyopathy 11/08/2017   CHF (congestive heart failure) ([place]) 11/08/2017   Foot callus 11/08/2017   Onychomycosis of multiple toenails with type 2 diabetes mellitus ([place]) 11/08/2017    Past Surgical History:  Procedure Laterality Date   COLONOSCOPY WITH PROPOFOL N/A 04/04/2019   Procedure: COLONOSCOPY WITH PROPOFOL;  Surgeon: Virgel Manifold, MD;  Location: [facility] END[facility];  Service: Endoscopy;  Laterality: N/A;   EYE SURGERY     LEFT HEART CATH AND CORONARY ANGIOGRAPHY N/A 07/21/2017   Procedure: LEFT HEART CATH AND CORONARY ANGIOGRAPHY;  Surgeon: Wellington Hampshire, MD;  Location: [place] [facility];  Service: Cardiovascular;  Laterality: N/A;    Family History  Problem Relation Age of Onset   Heart disease Mother    Hyperlipidemia Mother    Hypertension Mother    Heart attack Sister     Social History   Tobacco Use   Smoking status: Every Day    Packs/day: 0.50    Years: 25.00    Pack years: 12.50    Types: Cigarettes   Smokeless tobacco: Never   Tobacco comments:    09/06/17 Tobacco cessation reviewed. Currently smoking about 10 cigs a day.  Vaping Use   Vaping Use: Never used  Substance Use Topics   Alcohol use: Yes    Comment: rare   Drug use: No     No Known Allergies  Health Maintenance  Topic Date Due   OPHTHALMOLOGY EXAM  06/12/2019   COVID-19 Vaccine (4 - Booster for Pfizer series) 01/09/2021   HEMOGLOBIN A1C  01/28/2021   TETANUS/TDAP  07/30/2021 (Originally 12/26/1973)   Zoster Vaccines- Shingrix (1 of 2) 08/18/2021 (Originally 12/26/2004)   INFLUENZA VACCINE  11/18/2021 (Originally 03/21/2021)   FOOT EXAM  07/30/2021   COLONOSCOPY (Pts 45-50yrs Insurance coverage will need to be confirmed)  04/03/2024   Hepatitis C Screening  Completed   HPV VACCINES  Aged Out    Chart Review  Today: I have reviewed the patient's medical history in detail and updated the computerized patient record.   Review of Systems  Constitutional: Negative.   HENT: Negative.    Eyes: Negative.   Respiratory: Negative.    Cardiovascular: Negative.   Gastrointestinal: Negative.   Endocrine: Negative.   Genitourinary: Negative.   Musculoskeletal: Negative.   Skin: Negative.   Allergic/Immunologic: Negative.   Neurological: Negative.   Hematological: Negative.   Psychiatric/Behavioral: Negative.    All other systems reviewed and are negative.     Objective:   Vitals:   05/19/21 1117  BP: 132/68  Pulse: 90  Resp: 16  Temp: 98.2 F (36.8 C)  SpO2: 96%  Weight: 213 lb 4.8  oz (96.8 kg)  Height: 6' (1.829 m)    Body mass index is 28.93 kg/m.  Physical Exam Vitals and nursing note reviewed.  Constitutional:      General: He is not in acute distress.    Appearance: He is normal weight. He is not ill-appearing, toxic-appearing or diaphoretic.  HENT:     Head: Normocephalic and atraumatic.     Right Ear: External ear normal.     Left Ear: External ear normal.  Eyes:     General: No scleral icterus.       Right eye: No discharge.        Left eye: No discharge.     Conjunctiva/sclera: Conjunctivae normal.  Cardiovascular:     Rate and Rhythm: Normal rate and regular rhythm.     Pulses: Normal pulses.          Dorsalis pedis pulses are 2+ on the right side and 2+ on the left side.       Posterior tibial pulses are 2+ on the right side and 2+ on the left side.     Heart sounds: Normal heart sounds. No murmur heard.   No friction rub. No gallop.  Pulmonary:     Effort: Pulmonary effort is normal. No respiratory distress.     Breath sounds: Normal breath sounds. No stridor. No wheezing, rhonchi or rales.  Abdominal:     General: Bowel sounds are normal.     Palpations: Abdomen is soft.  Musculoskeletal:     Right lower leg: No edema.     Left lower leg: No edema.   Feet:     Right foot:     Skin integrity: Callus and dry skin present. No ulcer, blister, skin breakdown or erythema.     Toenail Condition: Right toenails are abnormally thick and long. Fungal disease present.    Left foot:     Skin integrity: Callus and dry skin present. No ulcer, blister, skin breakdown or erythema.     Toenail Condition: Left toenails are abnormally thick and long. Fungal disease present. Skin:    General: Skin is warm.     Coloration: Skin is not jaundiced or pale.     Findings: No lesion or rash.  Neurological:     Mental Status: He is alert. Mental status is at baseline.     Gait: Gait normal.  Psychiatric:        Mood and Affect: Mood normal.        Behavior: Behavior normal.     Diabetic Foot Exam - Simple   Simple Foot Form Diabetic Foot exam was performed with the following findings: Yes 05/19/2021 11:54 AM  Visual Inspection No deformities, no ulcerations, no other skin breakdown bilaterally: Yes Sensation Testing Intact to touch and monofilament testing bilaterally: Yes Pulse Check Posterior Tibialis and Dorsalis pulse intact bilaterally: Yes Comments Thickened great toenails bilaterally, very thick and raised Diffuse callus bilateral feet esp to heels, no ulceration or blisters        Assessment & Plan:   1. Type 2 diabetes mellitus with other specified complication, without long-term current use of insulin (HCC) Has been well controlled on jardiance only, not monitoring sugars, on statin and ARB, due for eye exam - [facility] Foot exam done again today - new podiatry referral - Hemoglobin A1C - COMPLETE METABOLIC PANEL WITH GFR - Ambulatory referral to Ophthalmology - CBC with Differential/Platelet  2. Chronic systolic congestive heart failure (HCC) Stable, no current sx concerning  for fluid overload, appears euvolemic - Lipid panel - COMPLETE METABOLIC PANEL WITH GFR  3. Dyslipidemia Compliant with meds, no SE, no myalgias,  fatigue or jaundice Due for FLP and recheck CMP Diet and exercise recommendations for HLD reviewed  Goal to keep LDL <70 - Lipid panel - COMPLETE METABOLIC PANEL WITH GFR  4. Medication monitoring encounter  - Hemoglobin A1C - Lipid panel - COMPLETE METABOLIC PANEL WITH GFR - CBC with Differential/Platelet  5. Encounter for monitoring antiplatelet therapy - CBC with Differential/Platelet  6. Tobacco use Smoking cessation instruction/counseling given:  counseled patient on the dangers of tobacco use, advised patient to stop smoking, and reviewed strategies to maximize success  - Ambulatory Referral Lung Cancer Screening [facility]  7. Onychomycosis of multiple toenails with type 2 diabetes mellitus (HCC) Failed terbinafine 250 mg daily x 6 months, no improvement or change to great toenail bilaterally - Ambulatory referral to Podiatry  8. Foot callus - Ambulatory referral to Podiatry  9. Ischemic cardiomyopathy Per cardiology, currency doing well, no concerning sx, last cardiology f/up reviwed  10. Erectile dysfunction, unspecified erectile dysfunction type Cardiology cleared pt to try ED meds, reviewed dosing, SE, risks with pt Asked him to f/up within the month to let me know what dose works for him - printed [facility] - shown website to find best prices  - sildenafil (REVATIO) 20 MG tablet; Take 1-3 tablets (20-60 mg total) by mouth daily as needed (use 30 min prior to sexual activity).  Dispense: 20 tablet; Refill: 0  11. Coronary artery disease of native artery of native heart with stable angina pectoris (HCC) stable sx, reviewed unstable angina sx, last cardiology note and testing reviewed    Return in about 6 months (around 11/16/2021) for Routine follow-up.   Delsa Grana, PA-C 05/19/21 11:33 AM

## 2021-05-20 LAB — COMPLETE METABOLIC PANEL WITH GFR
AG Ratio: 1.6 (calc) (ref 1.0–2.5)
ALT: 51 U/L — ABNORMAL HIGH (ref 9–46)
AST: 23 U/L (ref 10–35)
Albumin: 3.9 g/dL (ref 3.6–5.1)
Alkaline phosphatase (APISO): 59 U/L (ref 35–144)
BUN: 17 mg/dL (ref 7–25)
CO2: 24 mmol/L (ref 20–32)
Calcium: 9.4 mg/dL (ref 8.6–10.3)
Chloride: 109 mmol/L (ref 98–110)
Creat: 1 mg/dL (ref 0.70–1.35)
Globulin: 2.5 g/dL (calc) (ref 1.9–3.7)
Glucose, Bld: 88 mg/dL (ref 65–99)
Potassium: 4.1 mmol/L (ref 3.5–5.3)
Sodium: 141 mmol/L (ref 135–146)
Total Bilirubin: 0.5 mg/dL (ref 0.2–1.2)
Total Protein: 6.4 g/dL (ref 6.1–8.1)
eGFR: 83 mL/min/{1.73_m2} (ref >=60)

## 2021-05-20 LAB — CBC WITH DIFFERENTIAL/PLATELET
Absolute Monocytes: 596 cells/uL (ref 200–950)
Basophils Absolute: 59 cells/uL (ref 0–200)
Basophils Relative: 0.7 %
Eosinophils Absolute: 596 cells/uL — ABNORMAL HIGH (ref 15–500)
Eosinophils Relative: 7.1 %
HCT: 48.8 % (ref 38.5–50.0)
Hemoglobin: 16.7 g/dL (ref 13.2–17.1)
Lymphs Abs: 3301 cells/uL (ref 850–3900)
MCH: 31.7 pg (ref 27.0–33.0)
MCHC: 34.2 g/dL (ref 32.0–36.0)
MCV: 92.6 fL (ref 80.0–100.0)
MPV: 12.1 fL (ref 7.5–12.5)
Monocytes Relative: 7.1 %
Neutro Abs: 3847 cells/uL (ref 1500–7800)
Neutrophils Relative %: 45.8 %
Platelets: 300 10*3/uL (ref 140–400)
RBC: 5.27 10*6/uL (ref 4.20–5.80)
RDW: 13 % (ref 11.0–15.0)
Total Lymphocyte: 39.3 %
WBC: 8.4 10*3/uL (ref 3.8–10.8)

## 2021-05-20 LAB — LIPID PANEL
Cholesterol: 110 mg/dL (ref <200)
HDL: 24 mg/dL — ABNORMAL LOW (ref >=40)
LDL Cholesterol (Calc): 60 mg/dL (calc)
Non-HDL Cholesterol (Calc): 86 mg/dL (calc) (ref <130)
Total CHOL/HDL Ratio: 4.6 (calc) (ref <5.0)
Triglycerides: 188 mg/dL — ABNORMAL HIGH (ref <150)

## 2021-05-20 LAB — HEMOGLOBIN A1C
Hgb A1c MFr Bld: 6 % of total Hgb — ABNORMAL HIGH (ref <5.7)
Mean Plasma Glucose: 126 mg/dL
eAG (mmol/L): 7 mmol/L

## 2021-06-10 ENCOUNTER — Other Ambulatory Visit: Payer: Self-pay | Admitting: Cardiovascular Disease

## 2021-06-12 ENCOUNTER — Other Ambulatory Visit: Payer: Self-pay | Admitting: Family Medicine

## 2021-06-12 DIAGNOSIS — N529 Male erectile dysfunction, unspecified: Secondary | ICD-10-CM

## 2021-08-10 ENCOUNTER — Other Ambulatory Visit: Payer: Self-pay | Admitting: Family Medicine

## 2021-08-10 DIAGNOSIS — E1169 Type 2 diabetes mellitus with other specified complication: Secondary | ICD-10-CM

## 2021-08-10 MED ORDER — EMPAGLIFLOZIN 10 MG PO TABS: 10.0000 mg | ORAL_TABLET | Freq: Every day | ORAL | 2 refills | Status: DC

## 2021-08-10 NOTE — Telephone Encounter (Signed)
Sending to another pharmacy  Requested Prescriptions  Pending Prescriptions Disp Refills   empagliflozin (JARDIANCE) 10 MG TABS tablet 90 tablet 3    Sig: Take 1 tablet (10 mg total) by mouth daily before breakfast.     Endocrinology:  Diabetes - SGLT2 Inhibitors Failed - 08/10/2021  5:15 PM      Failed - AA eGFR in normal range and within 360 days    GFR, Est African American  Date Value Ref Range Status  07/30/2020 89 > OR = 60 mL/min/1.47m Final   GFR, Est Non African American  Date Value Ref Range Status  07/30/2020 77 > OR = 60 mL/min/1.791mFinal   eGFR  Date Value Ref Range Status  05/19/2021 83 > OR = 60 mL/min/1.7358minal    Comment:    The eGFR is based on the CKD-EPI 2021 equation. To calculate  the new eGFR from a previous Creatinine or Cystatin C result, go to https://www.kidney.org/professionals/ kdoqi/gfr%5Fcalculator          Passed - Cr in normal range and within 360 days    Creat  Date Value Ref Range Status  05/19/2021 1.00 0.70 - 1.35 mg/dL Final   Creatinine, Urine  Date Value Ref Range Status  11/11/2018 99 20 - 320 mg/dL Final         Passed - LDL in normal range and within 360 days    LDL Cholesterol (Calc)  Date Value Ref Range Status  05/19/2021 60 mg/dL (calc) Final    Comment:    Reference range: <100 . Desirable range <100 mg/dL for primary prevention;   <70 mg/dL for patients with CHD or diabetic patients  with > or = 2 CHD risk factors. . LMarland KitchenL-C is now calculated using the Martin-Hopkins  calculation, which is a validated novel method providing  better accuracy than the Friedewald equation in the  estimation of LDL-C.  MarCresenciano Genre al. JAMAnnamaria [id number];[id number]  (http://education.[employer].com/faq/FAQ164)          Passed - HBA1C is between 0 and 7.9 and within 180 days    Hgb A1c MFr Bld  Date Value Ref Range Status  05/19/2021 6.0 (H) <5.7 % of total Hgb Final    Comment:    For someone without known  diabetes, a hemoglobin  A1c value between 5.7% and 6.4% is consistent with prediabetes and should be confirmed with a  follow-up test. . For someone with known diabetes, a value <7% indicates that their diabetes is well controlled. A1c targets should be individualized based on duration of diabetes, age, comorbid conditions, and other considerations. . This assay result is consistent with an increased risk of diabetes. . Currently, no consensus exists regarding use of hemoglobin A1c for diagnosis of diabetes for children. .  Renella CunasValid encounter within last 6 months    Recent Outpatient Visits          2 months ago Type 2 diabetes mellitus with other specified complication, without long-term current use of insulin [facility]   1 year ago Type 2 diabetes mellitus with other specified complication, without long-term current use of insulin [facility]   1 year ago Type 2 diabetes mellitus with other specified complication, without long-term current use of insulin [facility]   2 years ago Chronic systolic congestive  heart failure [facility])   [facility] [employer], Raquel Sarna E, FNP   2 years ago Controlled type 2 diabetes mellitus without complication, without long-term current use of insulin [facility])   [place], NP      Future Appointments            In 3 months Delsa Grana, PA-C [facility], [facility]

## 2021-08-10 NOTE — Telephone Encounter (Signed)
Medication Refill - Medication: Jardiance Pt says [facility]'t [facility] anymore and he needs his medication sent to [facility] and also needs the coupon for medicine sent over to [facility] as well  Has the patient contacted their pharmacy? Yes.    (Agent: If yes, when and what did the pharmacy advise?) Pharmacy said they we're waiting on pcp to send medication but I don't see where they contacted pcp Preferred Pharmacy (with phone number or street name):  [id number] [address], [place]  Phone:  [phone] Fax:  [phone]  Has the patient been seen for an appointment in the last year OR does the patient have an upcoming appointment? Yes.    Agent: Please be advised that RX refills may take up to 3 business days. We ask that you follow-up with your pharmacy.

## 2021-10-11 ENCOUNTER — Telehealth: Payer: Self-pay

## 2021-10-11 NOTE — Telephone Encounter (Signed)
Spoke with patient and informed him that I would leave a coupon at the front desk for him to pick up for commercially insured patients.

## 2021-10-11 NOTE — Telephone Encounter (Signed)
Patient would like to know if we have any Entresto coupons. Please advise.

## 2021-11-02 ENCOUNTER — Telehealth: Payer: Self-pay | Admitting: Cardiovascular Disease

## 2021-11-02 ENCOUNTER — Other Ambulatory Visit: Payer: Self-pay

## 2021-11-02 MED ORDER — CARVEDILOL 3.125 MG PO TABS: 3.1250 mg | ORAL_TABLET | Freq: Two times a day (BID) | ORAL | 0 refills | Status: DC

## 2021-11-02 NOTE — Telephone Encounter (Signed)
Disp Refills Start End   ?carvedilol (COREG) 3.125 MG tablet 180 tablet 0 11/02/2021    ?Sig - Route: Take 1 tablet (3.125 mg total) by mouth 2 (two) times daily with a meal. - Oral   ?Sent to pharmacy as: carvedilol (COREG) 3.125 MG tablet   ?E-Prescribing Status: Receipt confirmed by pharmacy (11/02/2021  2:56 PM EDT)   ? ?Pharmacy ? ?[facility] #[id number] - [place], [place]  ? ?

## 2021-11-02 NOTE — Telephone Encounter (Signed)
?*  STAT* If patient is at the pharmacy, call can be transferred to refill team. ? ? ?1. Which medications need to be refilled? (please list name of each medication and dose if known) carvedilol 3.125 MG 1 tablet 2 times daily  ? ?2. Which pharmacy/location (including street and city if local pharmacy) is medication to be sent to? [facility] on [address] and [address] ? ?3. Do they need a 30 day or 90 day supply? 90 day  ? ?

## 2021-11-10 NOTE — Progress Notes (Signed)
?Cardiology Office Note:   ? ?Date:  11/11/2021  ? ?ID:  Lawrence Holmes, DOB [birth year], MRN [id number] ? ?PCP:  Delsa Grana, PA-C  ?[facility] Cardiologist:  Kathlyn Sacramento, MD  ?[place] Electrophysiologist:  None  ? ?Referring MD: Delsa Grana, PA-C  ? ?Chief Complaint: 6 month follow-up ? ?History of Present Illness:   ? ?Lawrence Holmes is a 66 y.o. male with a hx of CAD, HFrEF, ICM, MR, HTN and HLD who presents for 6 month follow-up.  ? ?The patient presented in December 2018 with Inferior STEMI with late presentation. Cardiac catheterization showed severe 2V CAD with total occlusion of the proximal RCA with L>R collaterals and occluded distal left circumflex with collaterals. No obstructive disease affecting the LAD. The culprit was felt to the the RCA. EF was found to be 25-30% with inferior wall akinesis. Wire was unable to cross so no intervention was performed. Echo showed LVEF 30-35%. The patient was treated medically. Repeat echo in March 2019 showed improvement of EF 40-45% with akinesis of inferior and inferoseptal myocardium. He had ETT 2021 which showed no evidence of ischemia.  ? ?Last seen 03/10/21 and was doing well. No changes were made ? ?Today, the patient reports every is the same since the last visit. Overall doing well from a cardiac perspective. No chest pain or SOB. He is fairly active. His job is driving. No LLE, orthopnea, pnd, palpitations. Prior ETT and echo reviewed. EKG with SR and no changes.  ? ?Past Medical History:  ?Diagnosis Date  ? Chronic systolic heart failure ([place]) 07/2017  ? a. 07/2017 Echo: EF 30-35%, Gr1 DD, inflat, and inf AK, mild to mod MR, mildly dil LA/RA; b. 10/2017 Echo: EF 40-45%, basal-midinf and infsept AK. Mild LVH. Mild MR. Nl RV fxn.  ? Coronary artery disease 07/2017  ? a. 07/2017 Late presenting inferior STEMI/Cath: LM nl, LAD min irregs, LCX 100d (L->L collats), RCA 100p (L->R collats), EF 25-35%-->Med Rx; b. 10/2017 ETT (DOT): Ex time 7:38, baseline  antlat TWI, no acute changes.   ? Diabetes mellitus without complication ([place])   ? Hyperlipidemia   ? Ischemic cardiomyopathy   ? a. 07/2017 Echo: EF 30-35%; b. 10/2017 Echo: EF 40-45%.  ? Tobacco use   ? ? ?Past Surgical History:  ?Procedure Laterality Date  ? COLONOSCOPY WITH PROPOFOL N/A 04/04/2019  ? Procedure: COLONOSCOPY WITH PROPOFOL;  Surgeon: Virgel Manifold, MD;  Location: [facility] ENDOSCOPY;  Service: Endoscopy;  Laterality: N/A;  ? EYE SURGERY    ? LEFT HEART CATH AND CORONARY ANGIOGRAPHY N/A 07/21/2017  ? Procedure: LEFT HEART CATH AND CORONARY ANGIOGRAPHY;  Surgeon: Wellington Hampshire, MD;  Location: [facility];  Service: Cardiovascular;  Laterality: N/A;  ? ? ?Current Medications: ?Current Meds  ?Medication Sig  ? aspirin 81 MG chewable tablet Chew 1 tablet (81 mg total) by mouth daily.  ? atorvastatin (LIPITOR) 80 MG tablet Take 1 tablet (80 mg total) by mouth at bedtime.  ? Blood Gluc Meter Disp-Strips (BLOOD GLUCOSE METER DISPOSABLE) DEVI Check fingerstick blood sugars once a day; E11.69, LON 99 months  ? carvedilol (COREG) 3.125 MG tablet Take 1 tablet (3.125 mg total) by mouth 2 (two) times daily with a meal.  ? empagliflozin (JARDIANCE) 10 MG TABS tablet Take 1 tablet (10 mg total) by mouth daily before breakfast.  ? ENTRESTO 24-26 MG TAKE 1 TABLET BY MOUTH TWICE A DAY  ? sildenafil (REVATIO) 20 MG tablet TAKE 1-3 TABLETS (20-60 MG  TOTAL) BY MOUTH DAILY AS NEEDED (USE 30 MIN PRIOR TO SEXUAL ACTIVITY).  ? spironolactone (ALDACTONE) 25 MG tablet TAKE 1 TABLET BY MOUTH EVERY DAY  ? terbinafine (LAMISIL) 250 MG tablet Take 1 tablet (250 mg total) by mouth daily.  ?  ? ?Allergies:   Patient has no known allergies.  ? ?Social History  ? ?Socioeconomic History  ? Marital status: Married  ?  Spouse name: Not on file  ? Number of children: Not on file  ? Years of education: Not on file  ? Highest education level: Not on file  ?Occupational History  ? Not on file  ?Tobacco Use  ? Smoking status:  Every Day  ?  Packs/day: 1.00  ?  Years: 25.00  ?  Pack years: 25.00  ?  Types: Cigarettes  ? Smokeless tobacco: Never  ? Tobacco comments:  ?  About a pack a day but doesn't finish every cigarette  ?  Will quit when his brand stops being available  ?Vaping Use  ? Vaping Use: Never used  ?Substance and Sexual Activity  ? Alcohol use: Yes  ?  Comment: rare  ? Drug use: No  ? Sexual activity: Yes  ?Other Topics Concern  ? Not on file  ?Social History Narrative  ? Not on file  ? ?Social Determinants of Health  ? ?Financial Resource Strain: Not on file  ?Food Insecurity: Not on file  ?Transportation Needs: Not on file  ?Physical Activity: Not on file  ?Stress: Not on file  ?Social Connections: Not on file  ?  ? ?Family History: ?The patient's family history includes Heart attack in his sister; Heart disease in his mother; Hyperlipidemia in his mother; Hypertension in his mother. ? ?ROS:   ?Please see the history of present illness.    ? All other systems reviewed and are negative. ? ?EKGs/Labs/Other Studies Reviewed:   ? ?The following studies were reviewed today: ? ?Echo limited 10/2017 ?Study Conclusions  ? ?- Limited study for evaluation of LVEF.  ?- Left ventricle: The cavity size was mildly dilated. Wall  ?  thickness was increased in a pattern of mild LVH. Systolic  ?  function was mildly to moderately reduced. The estimated ejection  ?  fraction was in the range of 40% to 45%. There is akinesis of the  ?  basal-midinferior and inferoseptal myocardium.  ?- Mitral valve: There was mild regurgitation.  ?- Right ventricle: The cavity size was mildly dilated. Systolic  ?  function was normal.  ? ?Recommendations:  LVEF has improved since 07/2017.  ? ?Echo 2018 ? ?Study Conclusions  ? ?- Left ventricle: The cavity size was mildly dilated. There was  ?  mild concentric hypertrophy. Systolic function was moderately to  ?  severely reduced. The estimated ejection fraction was in the  ?  range of 30% to 35%. There is  akinesis of the inferolateral and  ?  inferior myocardium. Doppler parameters are consistent with  ?  abnormal left ventricular relaxation (grade 1 diastolic  ?  dysfunction).  ?- Mitral valve: There was mild to moderate regurgitation.  ?- Left atrium: The atrium was mildly dilated.  ?- Right atrium: The atrium was mildly dilated.  ? ?Cardiac cath 07/2017 ?Prox Cx lesion is 40% stenosed. ?Mid Cx lesion is 40% stenosed. ?Dist Cx lesion is 100% stenosed. ?Prox RCA lesion is 100% stenosed. ?There is severe left ventricular systolic dysfunction. ?LV end diastolic pressure is mildly elevated. ?The left ventricular ejection fraction is  25-35% by visual estimate. ?There is trivial (1+) mitral regurgitation. ?  ?1.  Severe two-vessel coronary artery disease with total occlusion of proximal right coronary artery with well-developed left-to-right collaterals and occluded distal left circumflex also with collaterals.  No significant obstructive disease affecting the LAD. ?2.  Severely reduced LV systolic function with an EF of 25-30% with inferior wall akinesis as well as hypokinesis of the basal anterior wall.  Mildly elevated left ventricular end-diastolic pressure with an LVEDP of 14 mmHg.   ?3.  Attempted unsuccessful angioplasty of the right coronary artery due to inability to cross with a wire. ?  ?Recommendations: ?This is a late presenting inferior ST elevation myocardial infarction of at least 24 hours duration.  The EKG was with large inferior Q waves.  The left circumflex disease appears to be chronic.  The RCA occlusion appeared to be acute on chronic and thus I decided to probe with a wire to determine the ability to cross.  I was not able to cross with 2 wires.  There are already reasonable collaterals from the left coronary system.  Recommend medical therapy for coronary artery disease. ?The patient does have cardiomyopathy which seems to be mixed ischemic and nonischemic and out of proportion to his coronary  artery disease. ?I initiated treatment with carvedilol and losartan.  Consider spironolactone before hospital discharge. ?The patient needs treatment for diabetes as he has not been on medications. ?  ?Delays in

## 2021-11-11 ENCOUNTER — Ambulatory Visit: Payer: 59 | Admitting: Medical

## 2021-11-11 ENCOUNTER — Encounter: Payer: Self-pay | Admitting: Medical

## 2021-11-11 ENCOUNTER — Other Ambulatory Visit: Payer: Self-pay

## 2021-11-11 VITALS — BP 120/64 | HR 70 | Ht 72.0 in | Wt 216.2 lb

## 2021-11-11 DIAGNOSIS — I5022 Chronic systolic (congestive) heart failure: Secondary | ICD-10-CM

## 2021-11-11 DIAGNOSIS — I255 Ischemic cardiomyopathy: Secondary | ICD-10-CM | POA: Diagnosis not present

## 2021-11-11 DIAGNOSIS — I25118 Atherosclerotic heart disease of native coronary artery with other forms of angina pectoris: Secondary | ICD-10-CM | POA: Diagnosis not present

## 2021-11-11 DIAGNOSIS — I10 Essential (primary) hypertension: Secondary | ICD-10-CM

## 2021-11-11 DIAGNOSIS — E119 Type 2 diabetes mellitus without complications: Secondary | ICD-10-CM

## 2021-11-11 DIAGNOSIS — E785 Hyperlipidemia, unspecified: Secondary | ICD-10-CM

## 2021-11-11 NOTE — Patient Instructions (Signed)
Medication Instructions:  ? ?Your physician recommends that you continue on your current medications as directed. Please refer to the Current Medication list given to you today.  ? ?*If you need a refill on your cardiac medications before your next appointment, please call your pharmacy* ? ? ?Lab Work: ?None ordered ? ?If you have labs (blood work) drawn today and your tests are completely normal, you will receive your results only by: ?MyChart Message (if you have MyChart) OR ?A paper copy in the mail ?If you have any lab test that is abnormal or we need to change your treatment, we will call you to review the results. ? ? ?Testing/Procedures: ? ?Your physician has requested that you have an echocardiogram. Echocardiography is a painless test that uses sound waves to create images of your heart. It provides your doctor with information about the size and shape of your heart and how well your heart?s chambers and valves are working. This procedure takes approximately one hour. There are no restrictions for this procedure.  ? ? ?Follow-Up: ?At [facility], you and your health needs are our priority.  As part of our continuing mission to provide you with exceptional heart care, we have created designated Provider Care Teams.  These Care Teams include your primary Cardiologist (physician) and Advanced Practice Providers (APPs -  Physician Assistants and Nurse Practitioners) who all work together to provide you with the care you need, when you need it. ? ?We recommend signing up for the patient portal called "MyChart".  Sign up information is provided on this After Visit Summary.  MyChart is used to connect with patients for Virtual Visits (Telemedicine).  Patients are able to view lab/test results, encounter notes, upcoming appointments, etc.  Non-urgent messages can be sent to your provider as well.   ?To learn more about what you can do with MyChart, go to [email or web address].   ? ?Your next appointment:    ? ?Your physician wants you to follow-up in: 1 year  ? ?You will receive a reminder letter in the mail two months in advance. If you don't receive a letter, please call our office to schedule the follow-up appointment.  ? ?The format for your next appointment:   ?In Person ? ?Provider:   ?You may see Kathlyn Sacramento, MD or one of the following Advanced Practice Providers on your designated Care Team:   ?Murray Hodgkins, NP ?Christell Faith, PA-C ?Cadence Kathlen Mody, PA-C ? ? ?Other Instructions ?N/A ?

## 2021-11-18 ENCOUNTER — Ambulatory Visit: Payer: 59 | Admitting: Family Medicine

## 2021-11-18 ENCOUNTER — Encounter: Payer: Self-pay | Admitting: Family Medicine

## 2021-11-18 ENCOUNTER — Other Ambulatory Visit: Payer: Self-pay

## 2021-11-18 VITALS — BP 112/64 | HR 96 | Temp 97.7°F | Resp 16 | Ht 72.0 in | Wt 214.7 lb

## 2021-11-18 DIAGNOSIS — Z23 Encounter for immunization: Secondary | ICD-10-CM | POA: Diagnosis not present

## 2021-11-18 DIAGNOSIS — I5022 Chronic systolic (congestive) heart failure: Secondary | ICD-10-CM

## 2021-11-18 DIAGNOSIS — B351 Tinea unguium: Secondary | ICD-10-CM

## 2021-11-18 DIAGNOSIS — E1169 Type 2 diabetes mellitus with other specified complication: Secondary | ICD-10-CM | POA: Diagnosis not present

## 2021-11-18 DIAGNOSIS — Z5181 Encounter for therapeutic drug level monitoring: Secondary | ICD-10-CM

## 2021-11-18 DIAGNOSIS — I255 Ischemic cardiomyopathy: Secondary | ICD-10-CM

## 2021-11-18 DIAGNOSIS — F17210 Nicotine dependence, cigarettes, uncomplicated: Secondary | ICD-10-CM | POA: Diagnosis not present

## 2021-11-18 DIAGNOSIS — E785 Hyperlipidemia, unspecified: Secondary | ICD-10-CM

## 2021-11-18 DIAGNOSIS — I25118 Atherosclerotic heart disease of native coronary artery with other forms of angina pectoris: Secondary | ICD-10-CM

## 2021-11-18 DIAGNOSIS — I10 Essential (primary) hypertension: Secondary | ICD-10-CM

## 2021-11-18 MED ORDER — CARVEDILOL 3.125 MG PO TABS: 3.1250 mg | ORAL_TABLET | Freq: Two times a day (BID) | ORAL | 2 refills | Status: DC

## 2021-11-18 MED ORDER — ATORVASTATIN CALCIUM 80 MG PO TABS: 80.0000 mg | ORAL_TABLET | Freq: Every day | ORAL | 2 refills | Status: DC

## 2021-11-18 MED ORDER — SPIRONOLACTONE 25 MG PO TABS: 25.0000 mg | ORAL_TABLET | Freq: Every day | ORAL | 2 refills | Status: DC

## 2021-11-18 MED ORDER — TERBINAFINE HCL 250 MG PO TABS: 250.0000 mg | ORAL_TABLET | Freq: Every day | ORAL | 1 refills | Status: DC

## 2021-11-18 MED ORDER — ENTRESTO 24-26 MG PO TABS: 1.0000 | ORAL_TABLET | Freq: Two times a day (BID) | ORAL | 1 refills | Status: DC

## 2021-11-18 NOTE — Patient Instructions (Signed)
? ?  Urinary Frequency, Adult ?Urinary frequency means urinating more often than usual. You may urinate every 1-2 hours even though you drink a normal amount of fluid and do not have a bladder infection or condition. Although you urinate more often than normal, the total amount of urine produced in a day is normal. ?With urinary frequency, you may have an urgent need to urinate often. The stress and anxiety of needing to find a bathroom quickly can make this urge worse. This condition may go away on its own, or you may need treatment at home. Home treatment may include bladder training, exercises, taking medicines, or making changes to your diet. ?Follow these instructions at home: ?Bladder health ?Your health care provider will tell you what to do to improve bladder health. You may be told to: ?Keep a bladder diary. Keep track of: ?What you eat and drink. ?How often you urinate. ?How much you urinate. ?Follow a bladder training program. This may include: ?Learning to delay going to the bathroom. ?Double urinating, also called voiding. This helps if you are not completely emptying your bladder. ?Scheduled voiding. ?Do Kegel exercises. Kegel exercises strengthen the muscles that help control urination, which may help the condition. ? ?Eating and drinking ?Follow instructions from your health care provider about eating or drinking restrictions. You may be told to: ?Avoid caffeine. ?Drink fewer fluids, especially alcohol. ?Avoid drinking in the evening. ?Avoid foods or drinks that may irritate the bladder. These include coffee, tea, soda, artificial sweeteners, citrus, tomato-based foods, and chocolate. ?Eat foods that help prevent or treat constipation. Constipation can make urinary frequency worse. You may need to take these actions to prevent or treat constipation: ?Drink enough fluid to keep your urine pale yellow. ?Take over-the-counter or prescription medicines. ?Eat foods that are high in fiber, such as beans,  whole grains, and fresh fruits and vegetables. ?Limit foods that are high in fat and processed sugars, such as fried or sweet foods. ?General instructions ?Take over-the-counter and prescription medicines only as told by your health care provider. ?Keep all follow-up visits. This is important. ?Contact a health care provider if: ?You start urinating more often. ?You feel pain or irritation when you urinate. ?You notice blood in your urine. ?Your urine looks cloudy. ?You develop a fever. ?You begin vomiting. ?Get help right away if: ?You are unable to urinate. ?Summary ?Urinary frequency means urinating more often than usual. With urinary frequency, you may urinate every 1-2 hours even though you drink a normal amount of fluid and do not have a bladder infection or other bladder condition. ?Your health care provider may recommend that you keep a bladder diary, follow a bladder training program, or make dietary changes. ?If told by your health care provider, do Kegel exercises to strengthen the muscles that help control urination. ?Take over-the-counter and prescription medicines only as told by your health care provider. ?Contact a health care provider if your symptoms do not improve or get worse. ?This information is not intended to replace advice given to you by your health care provider. Make sure you discuss any questions you have with your health care provider. ?Document Revised: 03/12/2020 Document Reviewed: 03/12/2020 ?Elsevier Patient Education ? [place]. ? ?

## 2021-11-18 NOTE — Progress Notes (Signed)
? ?Name: Lawrence Holmes   MRN: [id number]    DOB: Dec 21, 1955   Date:11/18/2021 ? ?     Progress Note ? ?Chief Complaint  ?Patient presents with  ? Follow-up  ? Diabetes  ? Hyperlipidemia  ? ? ? ?Subjective:  ? ?Lawrence Holmes is a 66 y.o. male, presents to clinic for routine f/up ? ?Hypertension:  ?Currently managed on entresto 24-26 mg, spironolactone 25 mg and carvedilol 3.125 per cardiology - reviewed last OV ?Pt reports good med compliance and denies any SE.   ?Blood pressure today is well controlled. ?BP Readings from Last 3 Encounters:  ?11/18/21 112/64  ?11/11/21 120/64  ?05/19/21 132/68  ? ?Pt denies CP, SOB, exertional sx, LE edema, palpitation, Ha's, visual disturbances, lightheadedness, hypotension, syncope. ? ?CAD, HFrEF, ECM, MR ?Cardiology OV 11/11/21 - Dr. Fletcher Anon ?Has f/up echo scheduled - no change in management with his recent appt - note reviewed ? ? ?Hyperlipidemia: ?Currently treated with lipitor 80 mg, pt reports good med compliance ?Last Lipids:  last LDL at goal ?Lab Results  ?Component Value Date  ? CHOL 110 05/19/2021  ? HDL 24 (L) 05/19/2021  ? [place] 60 05/19/2021  ? TRIG 188 (H) 05/19/2021  ? CHOLHDL 4.6 05/19/2021  ? ?- Denies: Chest pain, shortness of breath, myalgias, claudication ? ?DM:   ?Pt managing DM with jardiance  ?Reports good med compliance ?Pt has some SE from meds - urinary frequency ?Blood sugars not checking ?Denies:  polydipsia, vision changes, neuropathy, hypoglycemia ?Recent pertinent labs: ?Lab Results  ?Component Value Date  ? HGBA1C 6.0 (H) 05/19/2021  ? HGBA1C 7.0 (A) 07/30/2020  ? HGBA1C 6.5 (H) 01/30/2020  ? ?Lab Results  ?Component Value Date  ? MICROALBUR 0.4 07/30/2020  ? [place] 60 05/19/2021  ? CREATININE 1.00 05/19/2021  ? ?Standard of care and health maintenance: ?Urine Microalbumin:  not indicated on ARB/entresto ?Foot exam:  done  ?DM eye exam:  needs, last in 2020 - [facility] ?ACEI/ARB:  yes ?Statin:  yes ? ?Fungal nail disease - consulted with podiatry,  stopped terbinafine after less than 6 months ? ?Current smoker with 40+ pack year hx, he denies chronic cough, wheeze, recurrent bronchitis, hemoptysis, chest pain, exertional shortness of breath.  He has never done lung cancer screening before, discussed low-dose CT today ?He declined smoking cessation ? ? ? ? ?Current Outpatient Medications:  ?  aspirin 81 MG chewable tablet, Chew 1 tablet (81 mg total) by mouth daily., Disp: 30 tablet, Rfl: 2 ?  atorvastatin (LIPITOR) 80 MG tablet, Take 1 tablet (80 mg total) by mouth at bedtime., Disp: 90 tablet, Rfl: 3 ?  Blood Gluc Meter Disp-Strips (BLOOD GLUCOSE METER DISPOSABLE) DEVI, Check fingerstick blood sugars once a day; E11.69, LON 99 months, Disp: 100 each, Rfl: 1 ?  carvedilol (COREG) 3.125 MG tablet, Take 1 tablet (3.125 mg total) by mouth 2 (two) times daily with a meal., Disp: 180 tablet, Rfl: 0 ?  empagliflozin (JARDIANCE) 10 MG TABS tablet, Take 1 tablet (10 mg total) by mouth daily before breakfast., Disp: 90 tablet, Rfl: 2 ?  ENTRESTO 24-26 MG, TAKE 1 TABLET BY MOUTH TWICE A DAY, Disp: 60 tablet, Rfl: 5 ?  sildenafil (REVATIO) 20 MG tablet, TAKE 1-3 TABLETS (20-60 MG TOTAL) BY MOUTH DAILY AS NEEDED (USE 30 MIN PRIOR TO SEXUAL ACTIVITY)., Disp: 15 tablet, Rfl: 1 ?  spironolactone (ALDACTONE) 25 MG tablet, TAKE 1 TABLET BY MOUTH EVERY DAY, Disp: 90 tablet, Rfl: 2 ?  terbinafine (LAMISIL)  250 MG tablet, Take 1 tablet (250 mg total) by mouth daily., Disp: 90 tablet, Rfl: 1 ? ?Patient Active Problem List  ? Diagnosis Date Noted  ? Coronary artery disease of native artery of native heart with stable angina pectoris ([place]) 05/19/2021  ? Dyslipidemia 07/30/2020  ? Polyp of colon   ? Diverticulosis of large intestine without diverticulitis   ? Low HDL (under 40) 05/13/2018  ? Type 2 diabetes mellitus, controlled ([place]) 02/08/2018  ? History of myocardial infarction 11/08/2017  ? Tobacco use 11/08/2017  ? Ischemic cardiomyopathy 11/08/2017  ? CHF (congestive heart  failure) ([place]) 11/08/2017  ? Foot callus 11/08/2017  ? Onychomycosis of multiple toenails with type 2 diabetes mellitus ([place]) 11/08/2017  ? ? ?Past Surgical History:  ?Procedure Laterality Date  ? COLONOSCOPY WITH PROPOFOL N/A 04/04/2019  ? Procedure: COLONOSCOPY WITH PROPOFOL;  Surgeon: Virgel Manifold, MD;  Location: [facility];  Service: Endoscopy;  Laterality: N/A;  ? EYE SURGERY    ? LEFT HEART CATH AND CORONARY ANGIOGRAPHY N/A 07/21/2017  ? Procedure: LEFT HEART CATH AND CORONARY ANGIOGRAPHY;  Surgeon: Wellington Hampshire, MD;  Location: [place] [facility];  Service: Cardiovascular;  Laterality: N/A;  ? ? ?Family History  ?Problem Relation Age of Onset  ? Heart disease Mother   ? Hyperlipidemia Mother   ? Hypertension Mother   ? Heart attack Sister   ? ? ?Social History  ? ?Tobacco Use  ? Smoking status: Every Day  ?  Packs/day: 1.00  ?  Years: 25.00  ?  Pack years: 25.00  ?  Types: Cigarettes  ? Smokeless tobacco: Never  ? Tobacco comments:  ?  About a pack a day but doesn't finish every cigarette  ?  Will quit when his brand stops being available  ?Vaping Use  ? Vaping Use: Never used  ?Substance Use Topics  ? Alcohol use: Yes  ?  Comment: rare  ? Drug use: No  ?  ? ?No Known Allergies ? ?Health Maintenance  ?Topic Date Due  ? OPHTHALMOLOGY EXAM  06/12/2019  ? HEMOGLOBIN A1C  11/16/2021  ? INFLUENZA VACCINE  11/18/2021 (Originally 03/21/2021)  ? COVID-19 Vaccine (4 - Booster for [place] series) 12/04/2021 (Originally 11/06/2020)  ? Zoster Vaccines- Shingrix (1 of 2) 02/17/2022 (Originally 12/26/2004)  ? Pneumonia Vaccine 48+ Years old (1 - PCV) 11/19/2022 (Originally 12/26/1960)  ? TETANUS/TDAP  11/19/2022 (Originally 12/26/1973)  ? FOOT EXAM  05/19/2022  ? COLONOSCOPY (Pts 45-50yr Insurance coverage will need to be confirmed)  04/03/2024  ? Hepatitis C Screening  Completed  ? HPV VACCINES  Aged Out  ? ? ?Chart Review Today: ?I personally reviewed active problem list, medication list, allergies, family  history, social history, health maintenance, notes from last encounter, lab results, imaging with the patient/caregiver today. ?Reviewed last cardiology and podiatry visits today with pt ? ?Review of Systems  ?Constitutional: Negative.   ?HENT: Negative.    ?Eyes: Negative.   ?Respiratory: Negative.    ?Cardiovascular: Negative.   ?Gastrointestinal: Negative.   ?Endocrine: Negative.   ?Genitourinary: Negative.   ?Musculoskeletal: Negative.   ?Skin: Negative.   ?Allergic/Immunologic: Negative.   ?Neurological: Negative.   ?Hematological: Negative.   ?Psychiatric/Behavioral: Negative.    ?All other systems reviewed and are negative.  ? ?Objective:  ? ?Vitals:  ? 11/18/21 1001  ?BP: 112/64  ?Pulse: 96  ?Resp: 16  ?Temp: 97.7 ?F (36.5 ?C)  ?TempSrc: Oral  ?SpO2: 97%  ?Weight: 214 lb 11.2 oz (97.4 kg)  ?  Height: 6' (1.829 m)  ? ? ?Body mass index is 29.12 kg/m?. ? ?Physical Exam ?Vitals and nursing note reviewed.  ?Constitutional:   ?   General: He is not in acute distress. ?   Appearance: Normal appearance. He is well-developed. He is not ill-appearing, toxic-appearing or diaphoretic.  ?HENT:  ?   Head: Normocephalic and atraumatic.  ?   Jaw: No trismus.  ?   Right Ear: External ear normal.  ?   Left Ear: External ear normal.  ?   Nose: Nose normal.  ?   Mouth/Throat:  ?   Mouth: Mucous membranes are moist.  ?   Pharynx: Oropharynx is clear.  ?Eyes:  ?   General: Lids are normal. No scleral icterus.    ?   Right eye: No discharge.     ?   Left eye: No discharge.  ?   Conjunctiva/sclera: Conjunctivae normal.  ?Neck:  ?   Trachea: Trachea and phonation normal. No tracheal deviation.  ?Cardiovascular:  ?   Rate and Rhythm: Normal rate and regular rhythm.  ?   Pulses: Normal pulses.     ?     Radial pulses are 2+ on the right side and 2+ on the left side.  ?     Posterior tibial pulses are 2+ on the right side and 2+ on the left side.  ?   Heart sounds: Normal heart sounds. No murmur heard. ?  No friction rub. No gallop.   ?Pulmonary:  ?   Effort: Pulmonary effort is normal. No respiratory distress.  ?   Breath sounds: Normal breath sounds. No stridor. No wheezing, rhonchi or rales.  ?Abdominal:  ?   General: Bowel sounds are normal.

## 2021-11-19 LAB — COMPLETE METABOLIC PANEL WITH GFR
AG Ratio: 1.7 (calc) (ref 1.0–2.5)
ALT: 40 U/L (ref 9–46)
AST: 20 U/L (ref 10–35)
Albumin: 4.5 g/dL (ref 3.6–5.1)
Alkaline phosphatase (APISO): 71 U/L (ref 35–144)
BUN: 17 mg/dL (ref 7–25)
CO2: 25 mmol/L (ref 20–32)
Calcium: 9.8 mg/dL (ref 8.6–10.3)
Chloride: 108 mmol/L (ref 98–110)
Creat: 1.05 mg/dL (ref 0.70–1.35)
Globulin: 2.7 g/dL (calc) (ref 1.9–3.7)
Glucose, Bld: 150 mg/dL — ABNORMAL HIGH (ref 65–99)
Potassium: 4.3 mmol/L (ref 3.5–5.3)
Sodium: 141 mmol/L (ref 135–146)
Total Bilirubin: 0.4 mg/dL (ref 0.2–1.2)
Total Protein: 7.2 g/dL (ref 6.1–8.1)
eGFR: 78 mL/min/{1.73_m2} (ref >=60)

## 2021-11-19 LAB — HEMOGLOBIN A1C
Hgb A1c MFr Bld: 8.3 % of total Hgb — ABNORMAL HIGH (ref <5.7)
Mean Plasma Glucose: 192 mg/dL
eAG (mmol/L): 10.6 mmol/L

## 2021-11-23 ENCOUNTER — Ambulatory Visit: Payer: 59 | Admitting: Family Medicine

## 2021-11-23 VITALS — BP 108/62 | HR 86 | Temp 97.8°F | Resp 16 | Ht 72.0 in | Wt 212.6 lb

## 2021-11-23 DIAGNOSIS — E1165 Type 2 diabetes mellitus with hyperglycemia: Secondary | ICD-10-CM

## 2021-11-23 DIAGNOSIS — E1169 Type 2 diabetes mellitus with other specified complication: Secondary | ICD-10-CM

## 2021-11-23 MED ORDER — METFORMIN HCL 500 MG PO TABS: ORAL_TABLET | ORAL | 0 refills | Status: DC

## 2021-11-23 MED ORDER — METFORMIN HCL 1000 MG PO TABS: 1000.0000 mg | ORAL_TABLET | Freq: Two times a day (BID) | ORAL | 0 refills | Status: DC

## 2021-11-23 MED ORDER — BLOOD GLUCOSE MONITOR KIT: PACK | 0 refills | Status: AC

## 2021-11-23 NOTE — Patient Instructions (Signed)
Repeat labs around July 1st to 3rd and do follow up appointment ? ?Restart metformin and gradually and slowly increase the dose from 500 mg once a day with food - up to 1000 mg twice a day with food. ? ? ?

## 2021-11-23 NOTE — Progress Notes (Signed)
? ? ?Patient ID: Lawrence Holmes, male    DOB: October 05, 1955, 66 y.o.   MRN: [id number] ? ?PCP: Delsa Grana, PA-C ? ?Chief Complaint  ?Patient presents with  ? Labs result  ?  Pt following up on A1C abnormal lab.  ? Follow-up  ? Diabetes  ? ? ?Subjective:  ? ?Lawrence Holmes is a 66 y.o. male, presents to clinic with CC of the following: ? ?HPI  ?F/up for uncontrolled DM and to review labs ?Managed on jardiance 10- previously did tolerate metformin, he states cause DM numbers were so good he allowed himself to eat more/snacks/sweets ?Recent pertinent labs: ?Lab Results  ?Component Value Date  ? HGBA1C 8.3 (H) 11/18/2021  ? HGBA1C 6.0 (H) 05/19/2021  ? HGBA1C 7.0 (A) 07/30/2020  ? ?Does not have a meter right now - last he checked blood sugars they were "good"  ? ? ?Patient Active Problem List  ? Diagnosis Date Noted  ? Smokes with greater than 30 pack year history 11/18/2021  ? Coronary artery disease of native artery of native heart with stable angina pectoris ([place]) 05/19/2021  ? Dyslipidemia 07/30/2020  ? Polyp of colon   ? Diverticulosis of large intestine without diverticulitis   ? Low HDL (under 40) 05/13/2018  ? Type 2 diabetes mellitus, controlled ([place]) 02/08/2018  ? History of myocardial infarction 11/08/2017  ? Tobacco use 11/08/2017  ? Ischemic cardiomyopathy 11/08/2017  ? CHF (congestive heart failure) ([place]) 11/08/2017  ? Foot callus 11/08/2017  ? Onychomycosis of multiple toenails with type 2 diabetes mellitus ([place]) 11/08/2017  ? Cataract, nuclear sclerotic, left eye 04/04/2016  ? ? ? ? ?Current Outpatient Medications:  ?  aspirin 81 MG chewable tablet, Chew 1 tablet (81 mg total) by mouth daily., Disp: 30 tablet, Rfl: 2 ?  atorvastatin (LIPITOR) 80 MG tablet, Take 1 tablet (80 mg total) by mouth at bedtime., Disp: 90 tablet, Rfl: 2 ?  Blood Gluc Meter Disp-Strips (BLOOD GLUCOSE METER DISPOSABLE) DEVI, Check fingerstick blood sugars once a day; E11.69, LON 99 months, Disp: 100 each, Rfl: 1 ?  carvedilol (COREG)  3.125 MG tablet, Take 1 tablet (3.125 mg total) by mouth 2 (two) times daily with a meal., Disp: 180 tablet, Rfl: 2 ?  empagliflozin (JARDIANCE) 10 MG TABS tablet, Take 1 tablet (10 mg total) by mouth daily before breakfast., Disp: 90 tablet, Rfl: 2 ?  sacubitril-valsartan (ENTRESTO) 24-26 MG, Take 1 tablet by mouth 2 (two) times daily., Disp: 180 tablet, Rfl: 1 ?  sildenafil (REVATIO) 20 MG tablet, TAKE 1-3 TABLETS (20-60 MG TOTAL) BY MOUTH DAILY AS NEEDED (USE 30 MIN PRIOR TO SEXUAL ACTIVITY)., Disp: 15 tablet, Rfl: 1 ?  spironolactone (ALDACTONE) 25 MG tablet, Take 1 tablet (25 mg total) by mouth daily., Disp: 90 tablet, Rfl: 2 ?  terbinafine (LAMISIL) 250 MG tablet, Take 1 tablet (250 mg total) by mouth daily., Disp: 90 tablet, Rfl: 1 ? ? ?No Known Allergies ? ? ?Social History  ? ?Tobacco Use  ? Smoking status: Every Day  ?  Packs/day: 1.00  ?  Years: 44.00  ?  Pack years: 44.00  ?  Types: Cigarettes  ?  Start date: 08/21/1974  ? Smokeless tobacco: Never  ? Tobacco comments:  ?  Started smoking around 66 y/o has stopped several times  ?Vaping Use  ? Vaping Use: Never used  ?Substance Use Topics  ? Alcohol use: Yes  ?  Comment: rare  ? Drug use: No  ?  ? ? ?  Chart Review Today: ?I personally reviewed active problem list, medication list, allergies, family history, social history, health maintenance, notes from last encounter, lab results, imaging with the patient/caregiver today. ? ? ?Review of Systems  ?Constitutional: Negative.   ?HENT: Negative.    ?Eyes: Negative.   ?Respiratory: Negative.    ?Cardiovascular: Negative.   ?Gastrointestinal: Negative.   ?Endocrine: Negative.   ?Genitourinary: Negative.   ?Musculoskeletal: Negative.   ?Skin: Negative.   ?Allergic/Immunologic: Negative.   ?Neurological: Negative.   ?Hematological: Negative.   ?Psychiatric/Behavioral: Negative.    ?All other systems reviewed and are negative. ? ?   ?Objective:  ? ?Vitals:  ? 11/23/21 1518  ?BP: 108/62  ?Pulse: 86  ?Resp: 16  ?Temp:  97.8 ?F (36.6 ?C)  ?TempSrc: Oral  ?SpO2: 95%  ?Weight: 212 lb 9.6 oz (96.4 kg)  ?Height: 6' (1.829 m)  ?  ?Body mass index is 28.83 kg/m?. ? ?Physical Exam ?Vitals and nursing note reviewed.  ?Constitutional:   ?   General: He is not in acute distress. ?   Appearance: Normal appearance. He is well-developed. He is not ill-appearing, toxic-appearing or diaphoretic.  ?HENT:  ?   Head: Normocephalic and atraumatic.  ?   Right Ear: External ear normal.  ?   Left Ear: External ear normal.  ?   Nose: Nose normal.  ?Eyes:  ?   General:     ?   Right eye: No discharge.     ?   Left eye: No discharge.  ?   Conjunctiva/sclera: Conjunctivae normal.  ?Neck:  ?   Trachea: No tracheal deviation.  ?Cardiovascular:  ?   Rate and Rhythm: Normal rate and regular rhythm.  ?Pulmonary:  ?   Effort: Pulmonary effort is normal. No respiratory distress.  ?   Breath sounds: No stridor.  ?Musculoskeletal:     ?   General: Normal range of motion.  ?Skin: ?   General: Skin is warm and dry.  ?   Findings: No rash.  ?Neurological:  ?   Mental Status: He is alert.  ?   Motor: No abnormal muscle tone.  ?   Coordination: Coordination normal.  ?Psychiatric:     ?   Mood and Affect: Mood normal.     ?   Behavior: Behavior normal.  ?  ? ?Results for orders placed or performed in visit on 11/18/21  ?Hemoglobin A1c  ?Result Value Ref Range  ? Hgb A1c MFr Bld 8.3 (H) <5.7 % of total Hgb  ? Mean Plasma Glucose 192 mg/dL  ? eAG (mmol/L) 10.6 mmol/L  ?COMPLETE METABOLIC PANEL WITH GFR  ?Result Value Ref Range  ? Glucose, Bld 150 (H) 65 - 99 mg/dL  ? BUN 17 7 - 25 mg/dL  ? Creat 1.05 0.70 - 1.35 mg/dL  ? eGFR 78 > OR = 60 mL/min/1.50m  ? BUN/Creatinine Ratio NOT APPLICABLE 6 - 22 (calc)  ? Sodium 141 135 - 146 mmol/L  ? Potassium 4.3 3.5 - 5.3 mmol/L  ? Chloride 108 98 - 110 mmol/L  ? CO2 25 20 - 32 mmol/L  ? Calcium 9.8 8.6 - 10.3 mg/dL  ? Total Protein 7.2 6.1 - 8.1 g/dL  ? Albumin 4.5 3.6 - 5.1 g/dL  ? Globulin 2.7 1.9 - 3.7 g/dL (calc)  ? AG Ratio 1.7  1.0 - 2.5 (calc)  ? Total Bilirubin 0.4 0.2 - 1.2 mg/dL  ? Alkaline phosphatase (APISO) 71 35 - 144 U/L  ? AST 20 10 -  35 U/L  ? ALT 40 9 - 46 U/L  ? ? ?   ?Assessment & Plan:  ? ?1. Uncontrolled type 2 diabetes mellitus with hyperglycemia ([place]) ?Labs from last appt show A1C increased/uncontrolled ?Recent pertinent labs: ?Lab Results  ?Component Value Date  ? HGBA1C 8.3 (H) 11/18/2021  ? HGBA1C 6.0 (H) 05/19/2021  ? HGBA1C 7.0 (A) 07/30/2020  ? ?Will restart metformin with slowly increasing dose ?He will work on diet - reducing carbs and sweet  ?New meter Rx ?Reviewed and educated pt on diet, blood sugar monitoring and target glucose ranges  ?Labs in 3 months with f/up appt in office or virtual after labs result ? ?- metFORMIN (GLUCOPHAGE) 500 MG tablet; Take 500 mg po once daily in am with food x 1 week, then increase to 500 mg po BID x 1 week, then increase to 1000 mg po qam and 500 mg po qpm  Dispense: 120 tablet; Refill: 0 ?- metFORMIN (GLUCOPHAGE) 1000 MG tablet; Take 1 tablet (1,000 mg total) by mouth 2 (two) times daily with a meal.  Dispense: 180 tablet; Refill: 0 ?- COMPLETE METABOLIC PANEL WITH GFR ?- Hemoglobin A1c ?- blood glucose meter kit and supplies KIT; Dispense based on patient and insurance preference. Use up to four times daily as directed. (FOR ICD-9 250.00, 250.01).  Dispense: 1 each; Refill: 0 ? ? ?He is aware of SE with metformin, previously tolerated it, will go slow and advised to take with food.  ? ? ?Delsa Grana, PA-C ?11/23/21 3:38 PM ? ?

## 2021-12-07 LAB — HM DIABETES EYE EXAM

## 2021-12-09 ENCOUNTER — Ambulatory Visit (INDEPENDENT_AMBULATORY_CARE_PROVIDER_SITE_OTHER): Payer: 59

## 2021-12-09 DIAGNOSIS — I255 Ischemic cardiomyopathy: Secondary | ICD-10-CM

## 2021-12-09 LAB — ECHOCARDIOGRAM COMPLETE
AR max vel: 2.31 cm2
AV Area VTI: 2.55 cm2
AV Area mean vel: 2.13 cm2
AV Mean grad: 4 mmHg
AV Peak grad: 7.8 mmHg
Ao pk vel: 1.4 m/s
Area-P 1/2: 2.57 cm2
Calc EF: 51.1 %
S' Lateral: 4.1 cm
Single Plane A2C EF: 50.8 %
Single Plane A4C EF: 53.1 %

## 2021-12-30 ENCOUNTER — Other Ambulatory Visit: Payer: Self-pay

## 2021-12-30 DIAGNOSIS — Z122 Encounter for screening for malignant neoplasm of respiratory organs: Secondary | ICD-10-CM

## 2021-12-30 DIAGNOSIS — F17210 Nicotine dependence, cigarettes, uncomplicated: Secondary | ICD-10-CM

## 2021-12-30 DIAGNOSIS — Z87891 Personal history of nicotine dependence: Secondary | ICD-10-CM

## 2022-01-17 ENCOUNTER — Encounter: Payer: Self-pay | Admitting: Acute Care

## 2022-01-17 ENCOUNTER — Ambulatory Visit (INDEPENDENT_AMBULATORY_CARE_PROVIDER_SITE_OTHER): Payer: 59 | Admitting: Acute Care

## 2022-01-17 DIAGNOSIS — F17210 Nicotine dependence, cigarettes, uncomplicated: Secondary | ICD-10-CM | POA: Diagnosis not present

## 2022-01-17 NOTE — Progress Notes (Signed)
Virtual Visit via Telephone Note  I connected with Lawrence Holmes on 07/05/21 at  2:00 PM EST by telephone and verified that I am speaking with the correct person using two identifiers.  Location: Patient: Home Provider: Working from home   I discussed the limitations, risks, security and privacy concerns of performing an evaluation and management service by telephone and the availability of in person appointments. I also discussed with the patient that there may be a patient responsible charge related to this service. The patient expressed understanding and agreed to proceed.  Shared Decision Making Visit Lung Cancer Screening Program [phone])   Eligibility: Age 66 y.o. Pack Years Smoking History Calculation 44 (# packs/per year x # years smoked) Recent History of coughing up blood  no Unexplained weight loss? no ( >Than 15 pounds within the last 6 months ) Prior History Lung / other cancer no (Diagnosis within the last 5 years already requiring surveillance chest CT Scans). Smoking Status Current Smoker Former Smokers: Years since quit: NA  Quit Date: NA  Visit Components: Discussion included one or more decision making aids. yes Discussion included risk/benefits of screening. yes Discussion included potential follow up diagnostic testing for abnormal scans. yes Discussion included meaning and risk of over diagnosis. yes Discussion included meaning and risk of False Positives. yes Discussion included meaning of total radiation exposure. yes  Counseling Included: Importance of adherence to annual lung cancer LDCT screening. yes Impact of comorbidities on ability to participate in the program. yes Ability and willingness to under diagnostic treatment. yes  Smoking Cessation Counseling: Current Smokers:  Discussed importance of smoking cessation. yes Information about tobacco cessation classes and interventions provided to patient. yes Patient provided with "ticket" for LDCT  Scan. yes Symptomatic Patient. yes  Counseling(Intermediate counseling: > three minutes) [address]406 Diagnosis Code: Tobacco Use Z72.0 Asymptomatic Patient no  Counseling NA Former Smokers:  Discussed the importance of maintaining cigarette abstinence. yes Diagnosis Code: Personal History of Nicotine Dependence. T90.300 Information about tobacco cessation classes and interventions provided to patient. Yes Patient provided with "ticket" for LDCT Scan. yes Written Order for Lung Cancer Screening with LDCT placed in Epic. Yes (CT Chest Lung Cancer Screening Low Dose W/O CM) [id number] Z12.2-Screening of respiratory organs Z87.891-Personal history of nicotine dependence   I spent 25 minutes of face to face time with him discussing the risks and benefits of lung cancer screening. We viewed a power point together that explained in detail the above noted topics. We took the time to pause the power point at intervals to allow for questions to be asked and answered to ensure understanding. We discussed that he had taken the single most powerful action possible to decrease his risk of developing lung cancer when he quit smoking. I counseled him to remain smoke free, and to contact me if he ever had the desire to smoke again so that I can provide resources and tools to help support the effort to remain smoke free. We discussed the time and location of the scan, and that either  Doroteo Glassman RN or I will call with the results within  24-48 hours of receiving them. He has my card and contact information in the event he needs to speak with me, in addition to a copy of the power point we reviewed as a resource. He verbalized understanding of all of the above and had no further questions upon leaving the office.     I explained to the patient that there has been a  high incidence of coronary artery disease noted on these exams. I explained that this is a non-gated exam therefore degree or severity cannot be determined.  This patient is on statin therapy. I have asked the patient to follow-up with their PCP regarding any incidental finding of coronary artery disease and management with diet or medication as they feel is clinically indicated. The patient verbalized understanding of the above and had no further questions.    I spent 3 minutes counseling on smoking cessation and the health risks of continued tobacco abuse    Amaury Kuzel D. Kenton Kingfisher, NP-C [facility] Personal contact information can be found on Amion  01/17/2022, 9:38 AM

## 2022-01-17 NOTE — Patient Instructions (Signed)
Thank you for participating in the [facility] Scre[facility]. It was our pleasure to meet you today. We will call you with the results of your scan within the next few days. Your scan will be assigned a Lung RADS category score by the physicians reading the scans.  This Lung RADS score determines follow up scanning.  See below for description of categories, and follow up screening recommendations. We will be in touch to schedule your follow up screening annually or based on recommendations of our providers. We will fax a copy of your scan results to your Primary Care Physician, or the physician who referred you to the program, to ensure they have the results. Please call the office if you have any questions or concerns regarding your scanning experience or results.  Our office number is [phone]. Please speak with Denise Phelps, RN. , or  Denise Buckner RN, They are  our Lung Cancer Screening RN.'s If They are unavailable when you call, Please leave a message on the voice mail. We will return your call at our earliest convenience.This voice mail is monitored several times a day.  Remember, if your scan is normal, we will scan you annually as long as you continue to meet the criteria for the program. (Age 55-77, Current smoker or smoker who has quit within the last 15 years). If you are a smoker, remember, quitting is the single most powerful action that you can take to decrease your risk of lung cancer and other pulmonary, breathing related problems. We know quitting is hard, and we are here to help.  Please let us know if there is anything we can do to help you meet your goal of quitting. If you are a former smoker, congratulations. We are proud of you! Remain smoke free! Remember you can refer friends or family members through the number above.  We will screen them to make sure they meet criteria for the program. Thank you for helping us take better care of you by  participating in Lung Screening.  You can receive free nicotine replacement therapy ( patches, gum or mints) by calling 1-800-QUIT NOW. Please call so we can get you on the path to becoming  a non-smoker. I know it is hard, but you can do this!  Lung RADS Categories:  Lung RADS 1: no nodules or definitely non-concerning nodules.  Recommendation is for a repeat annual scan in 12 months.  Lung RADS 2:  nodules that are non-concerning in appearance and behavior with a very low likelihood of becoming an active cancer. Recommendation is for a repeat annual scan in 12 months.  Lung RADS 3: nodules that are probably non-concerning , includes nodules with a low likelihood of becoming an active cancer.  Recommendation is for a 6-month repeat screening scan. Often noted after an upper respiratory illness. We will be in touch to make sure you have no questions, and to schedule your 6-month scan.  Lung RADS 4 A: nodules with concerning findings, recommendation is most often for a follow up scan in 3 months or additional testing based on our provider's assessment of the scan. We will be in touch to make sure you have no questions and to schedule the recommended 3 month follow up scan.  Lung RADS 4 B:  indicates findings that are concerning. We will be in touch with you to schedule additional diagnostic testing based on our provider's  assessment of the scan.  Other options for assistance in smoking cessation (   As covered by your insurance benefits)  Hypnosis for smoking cessation  [employer] [phone]  Acupuncture for smoking cessation  [facility] [phone]   

## 2022-01-18 ENCOUNTER — Ambulatory Visit
Admission: RE | Admit: 2022-01-18 | Discharge: 2022-01-18 | Disposition: A | Discharge status: Home / Self Care | Payer: 59 | Source: Ambulatory Visit | Attending: Acute Care | Admitting: Acute Care

## 2022-01-18 DIAGNOSIS — Z122 Encounter for screening for malignant neoplasm of respiratory organs: Secondary | ICD-10-CM | POA: Diagnosis present

## 2022-01-18 DIAGNOSIS — F17210 Nicotine dependence, cigarettes, uncomplicated: Secondary | ICD-10-CM | POA: Diagnosis present

## 2022-01-18 DIAGNOSIS — Z87891 Personal history of nicotine dependence: Secondary | ICD-10-CM | POA: Diagnosis present

## 2022-01-20 ENCOUNTER — Other Ambulatory Visit: Payer: Self-pay | Admitting: Acute Care

## 2022-01-20 DIAGNOSIS — Z122 Encounter for screening for malignant neoplasm of respiratory organs: Secondary | ICD-10-CM

## 2022-01-20 DIAGNOSIS — F17210 Nicotine dependence, cigarettes, uncomplicated: Secondary | ICD-10-CM

## 2022-01-20 DIAGNOSIS — Z87891 Personal history of nicotine dependence: Secondary | ICD-10-CM

## 2022-02-21 LAB — COMPLETE METABOLIC PANEL WITH GFR
AG Ratio: 1.7 (calc) (ref 1.0–2.5)
ALT: 24 U/L (ref 9–46)
AST: 17 U/L (ref 10–35)
Albumin: 4.1 g/dL (ref 3.6–5.1)
Alkaline phosphatase (APISO): 54 U/L (ref 35–144)
BUN/Creatinine Ratio: 14 (calc) (ref 6–22)
BUN: 19 mg/dL (ref 7–25)
CO2: 23 mmol/L (ref 20–32)
Calcium: 9.6 mg/dL (ref 8.6–10.3)
Chloride: 108 mmol/L (ref 98–110)
Creat: 1.38 mg/dL — ABNORMAL HIGH (ref 0.70–1.35)
Globulin: 2.4 g/dL (calc) (ref 1.9–3.7)
Glucose, Bld: 144 mg/dL — ABNORMAL HIGH (ref 65–99)
Potassium: 4.2 mmol/L (ref 3.5–5.3)
Sodium: 138 mmol/L (ref 135–146)
Total Bilirubin: 0.6 mg/dL (ref 0.2–1.2)
Total Protein: 6.5 g/dL (ref 6.1–8.1)
eGFR: 56 mL/min/{1.73_m2} — ABNORMAL LOW (ref >=60)

## 2022-02-21 LAB — HEMOGLOBIN A1C
Hgb A1c MFr Bld: 6 % of total Hgb — ABNORMAL HIGH (ref <5.7)
Mean Plasma Glucose: 126 mg/dL
eAG (mmol/L): 7 mmol/L

## 2022-02-23 ENCOUNTER — Encounter: Payer: Self-pay | Admitting: Family Medicine

## 2022-02-24 ENCOUNTER — Telehealth (INDEPENDENT_AMBULATORY_CARE_PROVIDER_SITE_OTHER): Payer: 59 | Admitting: Family Medicine

## 2022-02-24 DIAGNOSIS — I700 Atherosclerosis of aorta: Secondary | ICD-10-CM | POA: Diagnosis not present

## 2022-02-24 DIAGNOSIS — E1169 Type 2 diabetes mellitus with other specified complication: Secondary | ICD-10-CM

## 2022-02-24 DIAGNOSIS — E1165 Type 2 diabetes mellitus with hyperglycemia: Secondary | ICD-10-CM

## 2022-02-24 DIAGNOSIS — N179 Acute kidney failure, unspecified: Secondary | ICD-10-CM

## 2022-02-24 DIAGNOSIS — I25118 Atherosclerotic heart disease of native coronary artery with other forms of angina pectoris: Secondary | ICD-10-CM

## 2022-02-24 DIAGNOSIS — E119 Type 2 diabetes mellitus without complications: Secondary | ICD-10-CM | POA: Diagnosis not present

## 2022-02-24 DIAGNOSIS — F17200 Nicotine dependence, unspecified, uncomplicated: Secondary | ICD-10-CM

## 2022-02-24 DIAGNOSIS — J432 Centrilobular emphysema: Secondary | ICD-10-CM

## 2022-02-24 MED ORDER — METFORMIN HCL 500 MG PO TABS: 500.0000 mg | ORAL_TABLET | Freq: Two times a day (BID) | ORAL | 3 refills | Status: DC

## 2022-02-24 MED ORDER — METFORMIN HCL 1000 MG PO TABS: 1000.0000 mg | ORAL_TABLET | Freq: Two times a day (BID) | ORAL | 1 refills | Status: DC

## 2022-02-24 MED ORDER — EMPAGLIFLOZIN 10 MG PO TABS: 10.0000 mg | ORAL_TABLET | Freq: Every day | ORAL | 1 refills | Status: DC

## 2022-02-24 NOTE — Progress Notes (Signed)
Name: Lawrence Holmes   MRN: [id number]    DOB: [birth year]   Date:02/24/2022       Progress Note  Subjective:    I connected with  Lawrence Holmes  on 02/24/22 at  3:20 PM EDT by a video enabled telemedicine application and verified that I am speaking with the correct person using two identifiers.  I discussed the limitations of evaluation and management by telemedicine and the availability of in person appointments. The patient expressed understanding and agreed to proceed. Staff also discussed with the patient that there may be a patient responsible charge related to this service. Patient Location: home (connected later than scheduled time) Provider Location: [facility] Additional Individuals present: none  Chief Complaint  Patient presents with   Follow-up   Diabetes    Pt states blood sugars have ben good they have remained below 120. Pt unable to provide vital signs.    Lawrence Holmes is a 66 y.o. male, presents for virtual visit for routine follow up on the conditions listed above.  Lab review after completing labs a few days ago Uncontrolled DM - much improved On metformin with dose increased and jardiance 10 mg - higher dose of metformin did cause GI SE Pt stated at last appt he just needed to work on his diet/lifestyle Sugars improved - he has had no sugars over 130 since improving diet (cut out sugary drinks) He came to recheck labs Monday and sig better a1c - at goal Denies: Polyuria, polydipsia, vision changes, neuropathy, hypoglycemia Recent pertinent labs: Lab Results  Component Value Date   HGBA1C 6.0 (H) 02/20/2022   HGBA1C 8.3 (H) 11/18/2021   HGBA1C 6.0 (H) 05/19/2021   Lab Results  Component Value Date   MICROALBUR 0.4 07/30/2020   LDLCALC 60 05/19/2021   CREATININE 1.38 (H) 02/20/2022    His renal function did decline a little Renal function recent labs: sCr increased from baseline around 1.00-1.05 to 1.38 02/20/22  56 11/18/21  78 05/19/21 eGFR 83 Lab  Results  Component Value Date   GFRAA 89 07/30/2020   GFRAA 80 01/30/2020   GFRAA 82 06/10/2019    Lab Results  Component Value Date   CREATININE 1.38 (H) 02/20/2022   BUN 19 02/20/2022   NA 138 02/20/2022   K 4.2 02/20/2022   CL 108 02/20/2022   CO2 23 02/20/2022    Asks about CT findings  He is cutting back smoking    Patient Active Problem List   Diagnosis Date Noted   Smokes with greater than 30 pack year history 11/18/2021   Coronary artery disease of native artery of native heart with stable angina pectoris ([place]) 05/19/2021   Dyslipidemia 07/30/2020   Polyp of colon    Diverticulosis of large intestine without diverticulitis    Low HDL (under 40) 05/13/2018   Type 2 diabetes mellitus, controlled ([place]) 02/08/2018   History of myocardial infarction 11/08/2017   Tobacco use 11/08/2017   Ischemic cardiomyopathy 11/08/2017   CHF (congestive heart failure) ([place]) 11/08/2017   Foot callus 11/08/2017   Onychomycosis of multiple toenails with type 2 diabetes mellitus ([place]) 11/08/2017   Cataract, nuclear sclerotic, left eye 04/04/2016    Current Outpatient Medications:    ACCU-CHEK GUIDE test strip, SMARTSIG:Via Meter 1-4 Times Daily, Disp: , Rfl:    Accu-Chek Softclix Lancets lancets, SMARTSIG:Via Meter 1-4 Times Daily, Disp: , Rfl:    aspirin 81 MG chewable tablet, Chew 1 tablet (81 mg total) by mouth  daily., Disp: 30 tablet, Rfl: 2   atorvastatin (LIPITOR) 80 MG tablet, Take 1 tablet (80 mg total) by mouth at bedtime., Disp: 90 tablet, Rfl: 2   Blood Gluc Meter Disp-Strips (BLOOD GLUCOSE METER DISPOSABLE) DEVI, Check fingerstick blood sugars once a day; E11.69, LON 99 months, Disp: 100 each, Rfl: 1   blood glucose meter kit and supplies KIT, Dispense based on patient and insurance preference. Use up to four times daily as directed. (FOR ICD-9 250.00, 250.01)., Disp: 1 each, Rfl: 0   carvedilol (COREG) 3.125 MG tablet, Take 1 tablet (3.125 mg total) by mouth 2 (two) times  daily with a meal., Disp: 180 tablet, Rfl: 2   empagliflozin (JARDIANCE) 10 MG TABS tablet, Take 1 tablet (10 mg total) by mouth daily before breakfast., Disp: 90 tablet, Rfl: 2   metFORMIN (GLUCOPHAGE) 1000 MG tablet, Take 1 tablet (1,000 mg total) by mouth 2 (two) times daily with a meal., Disp: 180 tablet, Rfl: 0   sacubitril-valsartan (ENTRESTO) 24-26 MG, Take 1 tablet by mouth 2 (two) times daily., Disp: 180 tablet, Rfl: 1   sildenafil (REVATIO) 20 MG tablet, TAKE 1-3 TABLETS (20-60 MG TOTAL) BY MOUTH DAILY AS NEEDED (USE 30 MIN PRIOR TO SEXUAL ACTIVITY)., Disp: 15 tablet, Rfl: 1   spironolactone (ALDACTONE) 25 MG tablet, Take 1 tablet (25 mg total) by mouth daily., Disp: 90 tablet, Rfl: 2   terbinafine (LAMISIL) 250 MG tablet, Take 1 tablet (250 mg total) by mouth daily., Disp: 90 tablet, Rfl: 1   metFORMIN (GLUCOPHAGE) 500 MG tablet, Take 500 mg po once daily in am with food x 1 week, then increase to 500 mg po BID x 1 week, then increase to 1000 mg po qam and 500 mg po qpm, Disp: 120 tablet, Rfl: 0 No Known Allergies  Past Surgical History:  Procedure Laterality Date   COLONOSCOPY WITH PROPOFOL N/A 04/04/2019   Procedure: COLONOSCOPY WITH PROPOFOL;  Surgeon: Virgel Manifold, MD;  Location: [facility] ENDOSCOPY;  Service: Endoscopy;  Laterality: N/A;   EYE SURGERY     LEFT HEART CATH AND CORONARY ANGIOGRAPHY N/A 07/21/2017   Procedure: LEFT HEART CATH AND CORONARY ANGIOGRAPHY;  Surgeon: Wellington Hampshire, MD;  Location: [place] [facility];  Service: Cardiovascular;  Laterality: N/A;   Family History  Problem Relation Age of Onset   Heart disease Mother    Hyperlipidemia Mother    Hypertension Mother    Heart attack Sister    Social History   Socioeconomic History   Marital status: Married    Spouse name: Not on file   Number of children: Not on file   Years of education: Not on file   Highest education level: Not on file  Occupational History   Not on file  Tobacco Use    Smoking status: Every Day    Packs/day: 1.00    Years: 44.00    Total pack years: 44.00    Types: Cigarettes    Start date: 08/21/1974   Smokeless tobacco: Never   Tobacco comments:    Started smoking around 66 y/o has stopped several times  Vaping Use   Vaping Use: Never used  Substance and Sexual Activity   Alcohol use: Yes    Comment: rare   Drug use: No   Sexual activity: Yes  Other Topics Concern   Not on file  Social History Narrative   Not on file   Social Determinants of Health   Financial Resource Strain: Not on file  Food Insecurity: Not on file  Transportation Needs: Not on file  Physical Activity: Not on file  Stress: Not on file  Social Connections: Not on file  Intimate Partner Violence: Not on file    Chart Review Today: I have reviewed the patient's medical history in detail and updated the computerized patient record.   Review of Systems  Constitutional: Negative.   HENT: Negative.    Eyes: Negative.   Respiratory: Negative.    Cardiovascular: Negative.   Gastrointestinal: Negative.   Endocrine: Negative.   Genitourinary: Negative.   Musculoskeletal: Negative.   Skin: Negative.   Allergic/Immunologic: Negative.   Neurological: Negative.   Hematological: Negative.   Psychiatric/Behavioral: Negative.    All other systems reviewed and are negative.     Objective:    Virtual encounter, vitals limited, only able to obtain the following There were no vitals filed for this visit. There is no height or weight on file to calculate BMI. Nursing Note and Vital Signs reviewed.  Physical Exam Vitals and nursing note reviewed.  Pulmonary:     Effort: Pulmonary effort is normal. No respiratory distress.  Neurological:     Mental Status: He is alert.  Psychiatric:        Mood and Affect: Mood normal.        Behavior: Behavior normal.     PE limited by telephone encounter  No results found for this or any previous visit (from the past 72  hour(s)).  PHQ2/9:    02/24/2022    3:08 PM 11/23/2021    3:18 PM 11/18/2021    9:55 AM 05/19/2021   11:16 AM 07/30/2020    9:41 AM  Depression screen PHQ 2/9  Decreased Interest 0 0 0 0 0  Down, Depressed, Hopeless 0 0 0 0 0  PHQ - 2 Score 0 0 0 0 0  Altered sleeping 0 0 0 0   Tired, decreased energy 0 0 0 0   Change in appetite 0 0 0 0   Feeling bad or failure about yourself  0 0 0 0   Trouble concentrating 0 0 0 0   Moving slowly or fidgety/restless 0 0 0 0   Suicidal thoughts 0 0 0 0   PHQ-9 Score 0 0 0 0   Difficult doing work/chores Not difficult at all Not difficult at all Not difficult at all Not difficult at all    PHQ-2/9 Result is neg  Fall Risk:    02/24/2022    3:08 PM 11/23/2021    3:17 PM 11/18/2021    9:55 AM 05/19/2021   11:13 AM 07/30/2020    9:40 AM  Fall Risk   Falls in the past year? 0 0 0 0 0  Number falls in past yr: 0 0 0 0 0  Injury with Fall? 0 0 0 0 0  Risk for fall due to : No Fall Risks No Fall Risks No Fall Risks    Follow up Falls prevention discussed;Education provided Education provided;Falls prevention discussed Falls prevention discussed       Assessment and Plan:     ICD-10-CM   1. Controlled type 2 diabetes mellitus without complication, without long-term current use of insulin (HCC)  E11.9 DISCONTINUED: metFORMIN (GLUCOPHAGE) 1000 MG tablet   much better A1C with improved diet efforts, metformin 1000 mg BID causes GI se, with A1C returned to 6.0, reduce metformin dose, cont jardiance    2. Type 2 diabetes mellitus with other specified complication, without long-term  current use of insulin (HCC)  E11.69 empagliflozin (JARDIANCE) 10 MG TABS tablet   see above    3. Coronary artery disease of native artery of native heart with stable angina pectoris ([place])  I25.118    known disease, noted on CT lung cancer screening, manages with cardiology, no exertional CP, doing well    4. Aortic atherosclerosis (HCC)  I70.0    not suprising, on statin     5. Centrilobular emphysema ([place])  J43.2    per CT, discussed disease and sx, pt is trying to reduce smoking, not currently having dyspnea     6. Current smoker  F17.200    smoking cessation discussed and encouraged, continue low dose Ct lung cancer screening    7. AKI (acute kidney injury) ([place])  N17.9    mild, sCr increase and GFR decrease from normal, will recheck, avoid NSAIDs, stay hydrated, keep CBGS and BP well controlled      I discussed the assessment and treatment plan with the patient. The patient was provided an opportunity to ask questions and all were answered. The patient agreed with the plan and demonstrated an understanding of the instructions.  The patient was advised to call back or seek an in-person evaluation if the symptoms worsen or if the condition fails to improve as anticipated.  I provided 22+ minutes of non-face-to-face time during this encounter.  Delsa Grana, PA-C 02/24/22 4:21 PM

## 2022-05-07 ENCOUNTER — Other Ambulatory Visit: Payer: Self-pay

## 2022-05-07 ENCOUNTER — Emergency Department
Admission: EM | Admit: 2022-05-07 | Discharge: 2022-05-07 | Disposition: A | Discharge status: Home / Self Care | Payer: 59 | Attending: Emergency Medicine | Admitting: Emergency Medicine

## 2022-05-07 ENCOUNTER — Emergency Department: Payer: 59

## 2022-05-07 ENCOUNTER — Encounter: Payer: Self-pay | Admitting: *Deleted

## 2022-05-07 ENCOUNTER — Ambulatory Visit
Admission: EM | Admit: 2022-05-07 | Discharge: 2022-05-07 | Disposition: A | Discharge status: Home / Self Care | Payer: 59

## 2022-05-07 DIAGNOSIS — E119 Type 2 diabetes mellitus without complications: Secondary | ICD-10-CM | POA: Insufficient documentation

## 2022-05-07 DIAGNOSIS — I2510 Atherosclerotic heart disease of native coronary artery without angina pectoris: Secondary | ICD-10-CM | POA: Insufficient documentation

## 2022-05-07 DIAGNOSIS — K4030 Unilateral inguinal hernia, with obstruction, without gangrene, not specified as recurrent: Secondary | ICD-10-CM | POA: Diagnosis not present

## 2022-05-07 DIAGNOSIS — K4090 Unilateral inguinal hernia, without obstruction or gangrene, not specified as recurrent: Secondary | ICD-10-CM | POA: Diagnosis not present

## 2022-05-07 DIAGNOSIS — I509 Heart failure, unspecified: Secondary | ICD-10-CM | POA: Insufficient documentation

## 2022-05-07 DIAGNOSIS — R1032 Left lower quadrant pain: Secondary | ICD-10-CM | POA: Diagnosis present

## 2022-05-07 LAB — COMPREHENSIVE METABOLIC PANEL
ALT: 25 U/L (ref 0–44)
AST: 22 U/L (ref 15–41)
Albumin: 4.1 g/dL (ref 3.5–5.0)
Alkaline Phosphatase: 57 U/L (ref 38–126)
Anion gap: 9 (ref 5–15)
BUN: 21 mg/dL (ref 8–23)
CO2: 23 mmol/L (ref 22–32)
Calcium: 9.3 mg/dL (ref 8.9–10.3)
Chloride: 106 mmol/L (ref 98–111)
Creatinine, Ser: 1.33 mg/dL — ABNORMAL HIGH (ref 0.61–1.24)
GFR, Estimated: 59 mL/min — ABNORMAL LOW (ref >60)
Glucose, Bld: 132 mg/dL — ABNORMAL HIGH (ref 70–99)
Potassium: 3.9 mmol/L (ref 3.5–5.1)
Sodium: 138 mmol/L (ref 135–145)
Total Bilirubin: 0.9 mg/dL (ref 0.3–1.2)
Total Protein: 7.7 g/dL (ref 6.5–8.1)

## 2022-05-07 LAB — LIPASE, BLOOD: Lipase: 49 U/L (ref 11–51)

## 2022-05-07 LAB — CBC
HCT: 51.1 % (ref 39.0–52.0)
Hemoglobin: 17 g/dL (ref 13.0–17.0)
MCH: 31 pg (ref 26.0–34.0)
MCHC: 33.3 g/dL (ref 30.0–36.0)
MCV: 93.2 fL (ref 80.0–100.0)
Platelets: 292 10*3/uL (ref 150–400)
RBC: 5.48 MIL/uL (ref 4.22–5.81)
RDW: 13.6 % (ref 11.5–15.5)
WBC: 10.1 10*3/uL (ref 4.0–10.5)
nRBC: 0 % (ref 0.0–0.2)

## 2022-05-07 MED ORDER — IOHEXOL 300 MG/ML  SOLN
100.0000 mL | Freq: Once | INTRAMUSCULAR | Status: AC | PRN
  Administered 2022-05-07: 100 mL via INTRAVENOUS

## 2022-05-07 NOTE — ED Notes (Signed)
Pt to ED for 3d long pressure-type pain to LLQ abdomen. Pt in NAD. To CT now. MSE signed.

## 2022-05-07 NOTE — ED Notes (Signed)
Pt A&O, IV removed, pt given discharge instructions, pt ambulating with steady gait. 

## 2022-05-07 NOTE — ED Provider Notes (Signed)
[facility] Provider Note    Event Date/Time   First MD Initiated Contact with Patient 05/07/22 1020     (approximate)   History   Chief Complaint: Abdominal Pain   HPI  Lawrence Holmes is a 66 y.o. male with a history of CHF, CAD, diabetes who comes ED complaining of left lower quadrant abdominal pain for the past 3 days, acute onset, constant, worse with movement and certain positions.  No dysuria or urinary retention.  No constipation diarrhea vomiting or fever.  No trauma.  No heavy lifting.  Was seen in urgent care this morning, found to have inguinal hernia which was not reducible at bedside, sent to ED for further evaluation.  Pain currently 6 out of 10.     Physical Exam   Triage Vital Signs: ED Triage Vitals  Enc Vitals Group     BP 05/07/22 0948 123/74     Pulse Rate 05/07/22 0948 91     Resp 05/07/22 0948 20     Temp 05/07/22 0948 98 F (36.7 C)     Temp Source 05/07/22 0948 Oral     SpO2 05/07/22 0948 95 %     Weight 05/07/22 0957 210 lb (95.3 kg)     Height 05/07/22 0957 6' (1.829 m)     Head Circumference --      Peak Flow --      Pain Score 05/07/22 0956 6     Pain Loc --      Pain Edu? --      Excl. in [place]? --     Most recent vital signs: Vitals:   05/07/22 0948 05/07/22 1030  BP: 123/74 131/75  Pulse: 91 61  Resp: 20 16  Temp: 98 F (36.7 C)   SpO2: 95% 94%    General: Awake, no distress.  CV:  Good peripheral perfusion.  Resp:  Normal effort.  Abd:  No distention.  Small left inguinal hernia, hernia mass is soft and nontender, partially reduced on initial exam with manipulation Other:  Nontoxic.  Moist mucosa.   ED Results / Procedures / Treatments   Labs (all labs ordered are listed, but only abnormal results are displayed) Labs Reviewed  COMPREHENSIVE METABOLIC PANEL - Abnormal; Notable for the following components:      Result Value   Glucose, Bld 132 (*)    Creatinine, Ser 1.33 (*)    GFR, Estimated 59  (*)    All other components within normal limits  LIPASE, BLOOD  CBC  URINALYSIS, ROUTINE W REFLEX MICROSCOPIC     EKG    RADIOLOGY CT abdomen pelvis interpreted by me, shows a small direct hernia sac at the left lower abdomen above the inguinal ligament, containing small bowel.  Radiology report reviewed, no signs of incarceration or strangulation.   PROCEDURES:  Procedures   MEDICATIONS ORDERED IN ED: Medications  iohexol (OMNIPAQUE) 300 MG/ML solution 100 mL (100 mLs Intravenous Contrast Given 05/07/22 1110)     IMPRESSION / MDM / ASSESSMENT AND PLAN / ED COURSE  I reviewed the triage vital signs and the nursing notes.                              Differential diagnosis includes, but is not limited to, inguinal hernia, bowel obstruction, bowel ischemia.  Not septic.  Patient's presentation is most consistent with acute presentation with potential threat to life or bodily function.  Clinical Course as of 05/07/22 1232  Sun May 07, 2022  1031 Pt p/w left inguinal hernia. Able to partially reduce at bedside, but there is still palpable mass concerning for incarcerated segment. Will obtain CT a/p. Pt declines pain medication at this time. [PS]    Clinical Course User Index [PS] Carrie Mew, MD    ----------------------------------------- 12:32 PM on 05/07/2022 ----------------------------------------- After CT, I was able to fully reduce the hernia.  Patient is pain-free, no symptoms.  Discussed with surgery Dr. Christian Mate who will plan to see the patient in office in 2 days to consider surgical repair.   FINAL CLINICAL IMPRESSION(S) / ED DIAGNOSES   Final diagnoses:  Left inguinal hernia     Rx / DC Orders   ED Discharge Orders     None        Note:  This document was prepared using Dragon voice recognition software and may include unintentional dictation errors.   Carrie Mew, MD 05/07/22 [phone]

## 2022-05-07 NOTE — ED Triage Notes (Signed)
Per patient report, patient was sent to ED from Urgent care for possible incarcerated hernia on left lower abdomen. Patient reports pain increased over 3 days. Patient denies nausea.

## 2022-05-07 NOTE — ED Provider Notes (Signed)
Lawrence Holmes    CSN: [id number] Arrival date & time: 05/07/22  0846      History   Chief Complaint Chief Complaint  Patient presents with   Abdominal Pain    HPI Lawrence Holmes is a 66 y.o. male.    Abdominal Pain  Patient presents to urgent care with complaint of abdominal pain and bulge in his left lower quadrant x3 days.  Patient is concerned about possible hernia.    Past Medical History:  Diagnosis Date   Chronic systolic heart failure ([place]) 07/2017   a. 07/2017 Echo: EF 30-35%, Gr1 DD, inflat, and inf AK, mild to mod MR, mildly dil LA/RA; b. 10/2017 Echo: EF 40-45%, basal-midinf and infsept AK. Mild LVH. Mild MR. Nl RV fxn.   Coronary artery disease 07/2017   a. 07/2017 Late presenting inferior STEMI/Cath: LM nl, LAD min irregs, LCX 100d (L->L collats), RCA 100p (L->R collats), EF 25-35%-->Med Rx; b. 10/2017 ETT (DOT): Ex time 7:38, baseline antlat TWI, no acute changes.    Diabetes mellitus without complication ([place])    Hyperlipidemia    Ischemic cardiomyopathy    a. 07/2017 Echo: EF 30-35%; b. 10/2017 Echo: EF 40-45%.   Tobacco use     Patient Active Problem List   Diagnosis Date Noted   Aortic atherosclerosis ([place]) 02/24/2022   Smokes with greater than 30 pack year history 11/18/2021   Coronary artery disease of native artery of native heart with stable angina pectoris ([place]) 05/19/2021   Dyslipidemia 07/30/2020   Polyp of colon    Diverticulosis of large intestine without diverticulitis    Low HDL (under 40) 05/13/2018   Type 2 diabetes mellitus, controlled ([place]) 02/08/2018   History of myocardial infarction 11/08/2017   Tobacco use 11/08/2017   Ischemic cardiomyopathy 11/08/2017   CHF (congestive heart failure) ([place]) 11/08/2017   Foot callus 11/08/2017   Onychomycosis of multiple toenails with type 2 diabetes mellitus ([place]) 11/08/2017   Cataract, nuclear sclerotic, left eye 04/04/2016    Past Surgical History:  Procedure Laterality Date    COLONOSCOPY WITH PROPOFOL N/A 04/04/2019   Procedure: COLONOSCOPY WITH PROPOFOL;  Surgeon: Virgel Manifold, MD;  Location: [facility] ENDOSCOPY;  Service: Endoscopy;  Laterality: N/A;   EYE SURGERY     LEFT HEART CATH AND CORONARY ANGIOGRAPHY N/A 07/21/2017   Procedure: LEFT HEART CATH AND CORONARY ANGIOGRAPHY;  Surgeon: Wellington Hampshire, MD;  Location: [place] [facility];  Service: Cardiovascular;  Laterality: N/A;       Home Medications    Prior to Admission medications   Medication Sig Start Date End Date Taking? Authorizing Provider  ACCU-CHEK GUIDE test strip SMARTSIG:Via Meter 1-4 Times Daily 11/25/21   [provider]  Accu-Chek Softclix Lancets lancets SMARTSIG:Via Meter 1-4 Times Daily 11/25/21   [provider]  aspirin 81 MG chewable tablet Chew 1 tablet (81 mg total) by mouth daily. 07/24/17   Demetrios Loll, MD  atorvastatin (LIPITOR) 80 MG tablet Take 1 tablet (80 mg total) by mouth at bedtime. 11/18/21   Furth, Cadence H, PA-C  Blood Gluc Meter Disp-Strips (BLOOD GLUCOSE METER DISPOSABLE) DEVI Check fingerstick blood sugars once a day; E11.69, LON 99 months 05/13/18   Arnetha Courser, MD  blood glucose meter kit and supplies KIT Dispense based on patient and insurance preference. Use up to four times daily as directed. (FOR ICD-9 250.00, 250.01). 11/23/21   Delsa Grana, PA-C  carvedilol (COREG) 3.125 MG tablet Take 1 tablet (3.125 mg total)  by mouth 2 (two) times daily with a meal. 11/18/21   Furth, Cadence H, PA-C  empagliflozin (JARDIANCE) 10 MG TABS tablet Take 1 tablet (10 mg total) by mouth daily before breakfast. 02/24/22   Delsa Grana, PA-C  metFORMIN (GLUCOPHAGE) 500 MG tablet Take 1 tablet (500 mg total) by mouth 2 (two) times daily with a meal. 02/24/22   Delsa Grana, PA-C  sacubitril-valsartan (ENTRESTO) 24-26 MG Take 1 tablet by mouth 2 (two) times daily. 11/18/21   Furth, Cadence H, PA-C  sildenafil (REVATIO) 20 MG tablet TAKE 1-3 TABLETS (20-60 MG TOTAL) BY  MOUTH DAILY AS NEEDED (USE 30 MIN PRIOR TO SEXUAL ACTIVITY). 06/13/21   Delsa Grana, PA-C  spironolactone (ALDACTONE) 25 MG tablet Take 1 tablet (25 mg total) by mouth daily. 11/18/21   Furth, Cadence H, PA-C  terbinafine (LAMISIL) 250 MG tablet Take 1 tablet (250 mg total) by mouth daily. 11/18/21   Delsa Grana, PA-C    Family History Family History  Problem Relation Age of Onset   Heart disease Mother    Hyperlipidemia Mother    Hypertension Mother    Heart attack Sister     Social History Social History   Tobacco Use   Smoking status: Every Day    Packs/day: 1.00    Years: 44.00    Total pack years: 44.00    Types: Cigarettes    Start date: 08/21/1974   Smokeless tobacco: Never   Tobacco comments:    Started smoking around 66 y/o has stopped several times  Vaping Use   Vaping Use: Never used  Substance Use Topics   Alcohol use: Yes    Comment: rare   Drug use: No     Allergies   Patient has no known allergies.   Review of Systems Review of Systems  Gastrointestinal:  Positive for abdominal pain.    Physical Exam Triage Vital Signs ED Triage Vitals  Enc Vitals Group     BP 05/07/22 0906 124/69     Pulse Rate 05/07/22 0906 84     Resp 05/07/22 0906 18     Temp 05/07/22 0906 98 F (36.7 C)     Temp Source 05/07/22 0906 Oral     SpO2 05/07/22 0906 95 %     Weight --      Height --      Head Circumference --      Peak Flow --      Pain Score 05/07/22 0903 7     Pain Loc --      Pain Edu? --      Excl. in [place]? --    No data found.  Updated Vital Signs BP 124/69 (BP Location: Left Arm)   Pulse 84   Temp 98 F (36.7 C) (Oral)   Resp 18   SpO2 95%   Visual Acuity Right Eye Distance:   Left Eye Distance:   Bilateral Distance:    Right Eye Near:   Left Eye Near:    Bilateral Near:     Physical Exam   UC Treatments / Results  Labs (all labs ordered are listed, but only abnormal results are displayed) Labs Reviewed - No data to  display  EKG   Radiology No results found.  Procedures Procedures (including critical care time)  Medications Ordered in UC Medications - No data to display  Initial Impression / Assessment and Plan / UC Course  I have reviewed the triage vital signs and the nursing notes.  Pertinent labs & imaging results that were available during my care of the patient were reviewed by me and considered in my medical decision making (see chart for details).   Hernia present and irreducible without significant pain.  Patient is discharged to the ED for evaluation of possible incarcerated hernia.   Final Clinical Impressions(s) / UC Diagnoses   Final diagnoses:  Irreducible left inguinal hernia     Discharge Instructions      Please proceed to the emergency room for evaluation of your hernia.   ED Prescriptions   None    PDMP not reviewed this encounter.   Rose Phi, [place] 05/07/22 [phone]

## 2022-05-07 NOTE — Discharge Instructions (Signed)
Dr. Forest Becker office will schedule you an appointment on Tuesday.  Please expect a phone call from the office informing you of what time your appointment will be.

## 2022-05-07 NOTE — Discharge Instructions (Addendum)
Please proceed to the emergency room for evaluation of your hernia.

## 2022-05-07 NOTE — ED Triage Notes (Signed)
Pt. States that a couple days ago he started having abdominal pain and notice a slight bulge in his left lower corner of his abdomen.Pt. is concerned about a hernia and would like to be assessed.

## 2022-05-07 NOTE — ED Triage Notes (Signed)
Patient is being discharged from the Urgent Care and sent to the Emergency Department via POV . Per Annie Main FNP, patient is in need of higher level of care due to need for further evaluation- possible incarcerated hernia . Patient is aware and verbalizes understanding of plan of care.  Vitals:   05/07/22 0906  BP: 124/69  Pulse: 84  Resp: 18  Temp: 98 F (36.7 C)  SpO2: 95%

## 2022-05-09 ENCOUNTER — Encounter: Payer: Self-pay | Admitting: Surgery

## 2022-05-09 ENCOUNTER — Ambulatory Visit: Payer: 59 | Admitting: Surgery

## 2022-05-09 ENCOUNTER — Telehealth: Payer: Self-pay | Admitting: Surgery

## 2022-05-09 ENCOUNTER — Ambulatory Visit: Payer: Self-pay | Admitting: Surgery

## 2022-05-09 VITALS — BP 132/72 | HR 90 | Temp 98.1°F | Ht 72.0 in | Wt 207.0 lb

## 2022-05-09 DIAGNOSIS — K4090 Unilateral inguinal hernia, without obstruction or gangrene, not specified as recurrent: Secondary | ICD-10-CM | POA: Diagnosis not present

## 2022-05-09 DIAGNOSIS — R3989 Other symptoms and signs involving the genitourinary system: Secondary | ICD-10-CM | POA: Insufficient documentation

## 2022-05-09 HISTORY — DX: Unilateral inguinal hernia, without obstruction or gangrene, not specified as recurrent: K40.90

## 2022-05-09 NOTE — H&P (View-Only) (Signed)
Patient ID: Lawrence Holmes, male   DOB: [birth year], 66 y.o.   MRN: [id number]  Chief Complaint: Left inguinal hernia  History of Present Illness Lawrence Holmes is a 66 y.o. male with recent onset of left groin pain, with associated bulge.  May have noticed some degree of left groin bulge in the past, but was never painful.  Difficult to ascertain what exactly provoked the pain it may have been some lifting, climbing or some other work process.  The patient is a truck driver and presented to the ED with left groin pain and bulge, it was reduced and confirmed on CT to be otherwise uncomplicated.  He denies any difficulty with constipation or voiding.  Denies any provocation by coughing or sneezing.  Past Medical History Past Medical History:  Diagnosis Date   Chronic systolic heart failure ([place]) 07/2017   a. 07/2017 Echo: EF 30-35%, Gr1 DD, inflat, and inf AK, mild to mod MR, mildly dil LA/RA; b. 10/2017 Echo: EF 40-45%, basal-midinf and infsept AK. Mild LVH. Mild MR. Nl RV fxn.   Coronary artery disease 07/2017   a. 07/2017 Late presenting inferior STEMI/Cath: LM nl, LAD min irregs, LCX 100d (L->L collats), RCA 100p (L->R collats), EF 25-35%-->Med Rx; b. 10/2017 ETT (DOT): Ex time 7:38, baseline antlat TWI, no acute changes.    Diabetes mellitus without complication ([place])    Hyperlipidemia    Ischemic cardiomyopathy    a. 07/2017 Echo: EF 30-35%; b. 10/2017 Echo: EF 40-45%.   Tobacco use       Past Surgical History:  Procedure Laterality Date   COLONOSCOPY WITH PROPOFOL N/A 04/04/2019   Procedure: COLONOSCOPY WITH PROPOFOL;  Surgeon: Virgel Manifold, MD;  Location: [facility];  Service: Endoscopy;  Laterality: N/A;   EYE SURGERY     LEFT HEART CATH AND CORONARY ANGIOGRAPHY N/A 07/21/2017   Procedure: LEFT HEART CATH AND CORONARY ANGIOGRAPHY;  Surgeon: Wellington Hampshire, MD;  Location: [facility];  Service: Cardiovascular;  Laterality: N/A;    No Known Allergies  Current  Outpatient Medications  Medication Sig Dispense Refill   ACCU-CHEK GUIDE test strip SMARTSIG:Via Meter 1-4 Times Daily     Accu-Chek Softclix Lancets lancets SMARTSIG:Via Meter 1-4 Times Daily     aspirin 81 MG chewable tablet Chew 1 tablet (81 mg total) by mouth daily. 30 tablet 2   atorvastatin (LIPITOR) 80 MG tablet Take 1 tablet (80 mg total) by mouth at bedtime. 90 tablet 2   Blood Gluc Meter Disp-Strips (BLOOD GLUCOSE METER DISPOSABLE) DEVI Check fingerstick blood sugars once a day; E11.69, LON 99 months 100 each 1   blood glucose meter kit and supplies KIT Dispense based on patient and insurance preference. Use up to four times daily as directed. (FOR ICD-9 250.00, 250.01). 1 each 0   carvedilol (COREG) 3.125 MG tablet Take 1 tablet (3.125 mg total) by mouth 2 (two) times daily with a meal. 180 tablet 2   empagliflozin (JARDIANCE) 10 MG TABS tablet Take 1 tablet (10 mg total) by mouth daily before breakfast. 90 tablet 1   metFORMIN (GLUCOPHAGE) 500 MG tablet Take 1 tablet (500 mg total) by mouth 2 (two) times daily with a meal. 180 tablet 3   sacubitril-valsartan (ENTRESTO) 24-26 MG Take 1 tablet by mouth 2 (two) times daily. 180 tablet 1   sildenafil (REVATIO) 20 MG tablet TAKE 1-3 TABLETS (20-60 MG TOTAL) BY MOUTH DAILY AS NEEDED (USE 30 MIN PRIOR TO SEXUAL ACTIVITY). 15 tablet 1  spironolactone (ALDACTONE) 25 MG tablet Take 1 tablet (25 mg total) by mouth daily. 90 tablet 2   terbinafine (LAMISIL) 250 MG tablet Take 1 tablet (250 mg total) by mouth daily. 90 tablet 1   No current facility-administered medications for this visit.    Family History Family History  Problem Relation Age of Onset   Heart disease Mother    Hyperlipidemia Mother    Hypertension Mother    Heart attack Sister       Social History Social History   Tobacco Use   Smoking status: Every Day    Packs/day: 1.00    Years: 44.00    Total pack years: 44.00    Types: Cigarettes    Start date: 08/21/1974    Smokeless tobacco: Never   Tobacco comments:    Started smoking around 66 y/o has stopped several times  Vaping Use   Vaping Use: Never used  Substance Use Topics   Alcohol use: Yes    Comment: rare   Drug use: No        Review of Systems  Constitutional: Negative.   HENT: Negative.    Eyes: Negative.   Respiratory: Negative.    Cardiovascular: Negative.   Gastrointestinal: Negative.   Genitourinary: Negative.   Skin: Negative.   Neurological: Negative.   Psychiatric/Behavioral: Negative.        Physical Exam Blood pressure 132/72, pulse 90, temperature 98.1 F (36.7 C), temperature source Oral, height 6' (1.829 m), weight 207 lb (93.9 kg), SpO2 96 %. Last Weight  Most recent update: 05/09/2022 11:26 AM    Weight  93.9 kg (207 lb)             CONSTITUTIONAL: Well developed, and nourished, appropriately responsive and aware without distress.   EYES: Sclera non-icteric.   EARS, NOSE, MOUTH AND THROAT:  The oropharynx is clear. Oral mucosa is pink and moist.   Hearing is intact to voice.  NECK: Trachea is midline, and there is no jugular venous distension.  LYMPH NODES:  Lymph nodes in the neck are not enlarged. RESPIRATORY:  Lungs are clear, and breath sounds are equal bilaterally. Normal respiratory effort without pathologic use of accessory muscles. CARDIOVASCULAR: Heart is regular in rate and rhythm. GI: The abdomen is  soft, nontender, and nondistended. There were no palpable masses. I did not appreciate hepatosplenomegaly. There were normal bowel sounds. GU: I failed to examine the prostate, and did not perform DRE not appreciating the findings on the CT scan examination prior to the patient's departure. He has a readily apparent left groin hernia which is reducible.  I cannot appreciate a right groin hernia on Valsalva.  Testes are descended and equal in size.  MUSCULOSKELETAL:  Symmetrical muscle tone appreciated in all four extremities.    SKIN: Skin turgor is  normal. No pathologic skin lesions appreciated.  NEUROLOGIC:  Motor and sensation appear grossly normal.  Cranial nerves are grossly without defect. PSYCH:  Alert and oriented to person, place and time. Affect is appropriate for situation.  Data Reviewed I have personally reviewed what is currently available of the patient's imaging, recent labs and medical records.   Labs:     Latest Ref Rng & Units 05/07/2022    9:58 AM 05/19/2021   12:13 PM 01/30/2020    3:29 PM  CBC  WBC 4.0 - 10.5 K/uL 10.1  8.4  8.8   Hemoglobin 13.0 - 17.0 g/dL 17.0  16.7  16.2   Hematocrit 39.0 - 52.0 %  51.1  48.8  48.5   Platelets 150 - 400 K/uL 292  300  252       Latest Ref Rng & Units 05/07/2022    9:58 AM 02/20/2022    2:17 PM 11/18/2021   10:39 AM  CMP  Glucose 70 - 99 mg/dL 132  144  150   BUN 8 - 23 mg/dL _0 Creatinine 0.61 - 1.24 mg/dL 1.33  1.38  1.05   Sodium 135 - 145 mmol/L 138  138  141   Potassium 3.5 - 5.1 mmol/L 3.9  4.2  4.3   Chloride 98 - 111 mmol/L 106  108  108   CO2 22 - 32 mmol/L _1 Calcium 8.9 - 10.3 mg/dL 9.3  9.6  9.8   Total Protein 6.5 - 8.1 g/dL 7.7  6.5  7.2   Total Bilirubin 0.3 - 1.2 mg/dL 0.9  0.6  0.4   Alkaline Phos 38 - 126 U/L 57     AST 15 - 41 U/L _2 ALT 0 - 44 U/L 25  24  40       Imaging: Radiology review:  CLINICAL DATA:  Left inguinal hernia. Abdominal pain and slight bulge in the left lower abdomen. Bowel obstruction suspected.   EXAM: CT ABDOMEN AND PELVIS WITH CONTRAST   TECHNIQUE: Multidetector CT imaging of the abdomen and pelvis was performed using the standard protocol following bolus administration of intravenous contrast.   RADIATION DOSE REDUCTION: This exam was performed according to the departmental dose-optimization program which includes automated exposure control, adjustment of the mA and/or kV according to patient size and/or use of iterative reconstruction technique.   CONTRAST:  147m OMNIPAQUE  IOHEXOL 300 MG/ML  SOLN   COMPARISON:  None Available.   FINDINGS: Lower chest: Bibasilar dependent atelectasis.  No acute abnormality.   Hepatobiliary: No focal liver abnormality is seen. No gallstones, gallbladder wall thickening, or biliary dilatation.   Pancreas: Unremarkable. No pancreatic ductal dilatation or surrounding inflammatory changes.   Spleen: Normal in size without focal abnormality.   Adrenals/Urinary Tract: Adrenal glands are unremarkable. Kidneys are normal, without renal calculi, focal lesion, or hydronephrosis. Bladder is unremarkable.   Stomach/Bowel: Stomach is within normal limits. Appendix appears normal. No evidence of bowel wall thickening, distention, or inflammatory changes.   Vascular/Lymphatic: Aortic atherosclerosis. No enlarged abdominal or pelvic lymph nodes.   Reproductive: Prostate is enlarged and protrudes into the base of the bladder. The prostate measures approximately 5.5 x 4.2 cm.   Other: There is a left inguinal hernia containing small portion of the small bowel. No evidence of bowel obstruction. No fat stranding in the hernial sac.   Musculoskeletal: Partial interbody fusion of L4 and L5 vertebral body. Degenerate disc disease at L3-L4 and L5-S1 with bulky osteophytes. Facet joint arthropathy at L5-S1. No acute osseous abnormality.   IMPRESSION: 1. Small left inguinal hernia containing small portion of the small bowel without evidence of obstruction or inflammatory changes in the hernial sac.   2. Enlarged prostate measuring 5.5 x 4.2 cm, the prostate protrudes into the urinary bladder. Correlate with PSA and prostate MRI examination could be considered for further evaluation, if clinically warranted.   3. Bowel loops are normal in caliber. Normal appendix. No evidence of colitis or diverticulitis.   4. Partial interbody fusion at L4-L5, likely congenital process. Degenerate disc disease with osteophytes at L3-L4 and  L5-S1  with bulky osteophytes. Prominent facet joint arthropathy at L5-S1. No acute osseous abnormality.     Electronically Signed   By: Keane Police D.O.   On: 05/07/2022 11:36 Within last 24 hrs: No results found.  Assessment    Left inguinal hernia. Prostatic abnormality is noted on CT. Patient Active Problem List   Diagnosis Date Noted   Aortic atherosclerosis ([place]) 02/24/2022   Smokes with greater than 30 pack year history 11/18/2021   Coronary artery disease of native artery of native heart with stable angina pectoris ([place]) 05/19/2021   Dyslipidemia 07/30/2020   Polyp of colon    Diverticulosis of large intestine without diverticulitis    Low HDL (under 40) 05/13/2018   Type 2 diabetes mellitus, controlled ([place]) 02/08/2018   History of myocardial infarction 11/08/2017   Tobacco use 11/08/2017   Ischemic cardiomyopathy 11/08/2017   CHF (congestive heart failure) ([place]) 11/08/2017   Foot callus 11/08/2017   Onychomycosis of multiple toenails with type 2 diabetes mellitus ([place]) 11/08/2017   Cataract, nuclear sclerotic, left eye 04/04/2016    Plan    Referral to urology. Robotic repair of left inguinal hernia.  I discussed possibility of incarceration, strangulation, enlargement in size over time, and the need for emergency surgery in the face of these.  Also reviewed the techniques of reduction should incarceration occur, and when unsuccessful to present to the ED.  Also discussed that surgery risks include recurrence which can be up to 30% in the case of complex hernias, use of prosthetic materials (mesh) and the increased risk of infection and the possible need for re-operation and removal of mesh, possibility of post-op SBO or ileus, and the risks of general anesthetic including heart attack, stroke, sudden death or some reaction to anesthetic medications. The patient, and those present, appear to understand the risks, any and all questions were answered to the patient's  satisfaction.  No guarantees were ever expressed or implied.   Face-to-face time spent with the patient and accompanying care providers(if present) was 30 minutes, with more than 50% of the time spent counseling, educating, and coordinating care of the patient.    These notes generated with voice recognition software. I apologize for typographical errors.  Ronny Bacon M.D., FACS 05/09/2022, 12:03 PM

## 2022-05-09 NOTE — Telephone Encounter (Signed)
Patient has been advised of Pre-Admission date/time, and Surgery date at [facility].  Surgery Date: 05/19/22 Preadmission Testing Date: 05/15/22 (phone 8a-1p)  Patient has been made aware to call [phone], between 1-3:00pm the day before surgery, to find out what time to arrive for surgery.

## 2022-05-09 NOTE — Progress Notes (Signed)
Patient ID: Lawrence Holmes, male   DOB: [birth year], 66 y.o.   MRN: [id number]  Chief Complaint: Left inguinal hernia  History of Present Illness Lawrence Holmes is a 66 y.o. male with recent onset of left groin pain, with associated bulge.  May have noticed some degree of left groin bulge in the past, but was never painful.  Difficult to ascertain what exactly provoked the pain it may have been some lifting, climbing or some other work process.  The patient is a truck driver and presented to the ED with left groin pain and bulge, it was reduced and confirmed on CT to be otherwise uncomplicated.  He denies any difficulty with constipation or voiding.  Denies any provocation by coughing or sneezing.  Past Medical History Past Medical History:  Diagnosis Date   Chronic systolic heart failure ([place]) 07/2017   a. 07/2017 Echo: EF 30-35%, Gr1 DD, inflat, and inf AK, mild to mod MR, mildly dil LA/RA; b. 10/2017 Echo: EF 40-45%, basal-midinf and infsept AK. Mild LVH. Mild MR. Nl RV fxn.   Coronary artery disease 07/2017   a. 07/2017 Late presenting inferior STEMI/Cath: LM nl, LAD min irregs, LCX 100d (L->L collats), RCA 100p (L->R collats), EF 25-35%-->Med Rx; b. 10/2017 ETT (DOT): Ex time 7:38, baseline antlat TWI, no acute changes.    Diabetes mellitus without complication ([place])    Hyperlipidemia    Ischemic cardiomyopathy    a. 07/2017 Echo: EF 30-35%; b. 10/2017 Echo: EF 40-45%.   Tobacco use       Past Surgical History:  Procedure Laterality Date   COLONOSCOPY WITH PROPOFOL N/A 04/04/2019   Procedure: COLONOSCOPY WITH PROPOFOL;  Surgeon: Virgel Manifold, MD;  Location: [facility];  Service: Endoscopy;  Laterality: N/A;   EYE SURGERY     LEFT HEART CATH AND CORONARY ANGIOGRAPHY N/A 07/21/2017   Procedure: LEFT HEART CATH AND CORONARY ANGIOGRAPHY;  Surgeon: Wellington Hampshire, MD;  Location: [facility];  Service: Cardiovascular;  Laterality: N/A;    No Known Allergies  Current  Outpatient Medications  Medication Sig Dispense Refill   ACCU-CHEK GUIDE test strip SMARTSIG:Via Meter 1-4 Times Daily     Accu-Chek Softclix Lancets lancets SMARTSIG:Via Meter 1-4 Times Daily     aspirin 81 MG chewable tablet Chew 1 tablet (81 mg total) by mouth daily. 30 tablet 2   atorvastatin (LIPITOR) 80 MG tablet Take 1 tablet (80 mg total) by mouth at bedtime. 90 tablet 2   Blood Gluc Meter Disp-Strips (BLOOD GLUCOSE METER DISPOSABLE) DEVI Check fingerstick blood sugars once a day; E11.69, LON 99 months 100 each 1   blood glucose meter kit and supplies KIT Dispense based on patient and insurance preference. Use up to four times daily as directed. (FOR ICD-9 250.00, 250.01). 1 each 0   carvedilol (COREG) 3.125 MG tablet Take 1 tablet (3.125 mg total) by mouth 2 (two) times daily with a meal. 180 tablet 2   empagliflozin (JARDIANCE) 10 MG TABS tablet Take 1 tablet (10 mg total) by mouth daily before breakfast. 90 tablet 1   metFORMIN (GLUCOPHAGE) 500 MG tablet Take 1 tablet (500 mg total) by mouth 2 (two) times daily with a meal. 180 tablet 3   sacubitril-valsartan (ENTRESTO) 24-26 MG Take 1 tablet by mouth 2 (two) times daily. 180 tablet 1   sildenafil (REVATIO) 20 MG tablet TAKE 1-3 TABLETS (20-60 MG TOTAL) BY MOUTH DAILY AS NEEDED (USE 30 MIN PRIOR TO SEXUAL ACTIVITY). 15 tablet 1  spironolactone (ALDACTONE) 25 MG tablet Take 1 tablet (25 mg total) by mouth daily. 90 tablet 2   terbinafine (LAMISIL) 250 MG tablet Take 1 tablet (250 mg total) by mouth daily. 90 tablet 1   No current facility-administered medications for this visit.    Family History Family History  Problem Relation Age of Onset   Heart disease Mother    Hyperlipidemia Mother    Hypertension Mother    Heart attack Sister       Social History Social History   Tobacco Use   Smoking status: Every Day    Packs/day: 1.00    Years: 44.00    Total pack years: 44.00    Types: Cigarettes    Start date: 08/21/1974    Smokeless tobacco: Never   Tobacco comments:    Started smoking around 66 y/o has stopped several times  Vaping Use   Vaping Use: Never used  Substance Use Topics   Alcohol use: Yes    Comment: rare   Drug use: No        Review of Systems  Constitutional: Negative.   HENT: Negative.    Eyes: Negative.   Respiratory: Negative.    Cardiovascular: Negative.   Gastrointestinal: Negative.   Genitourinary: Negative.   Skin: Negative.   Neurological: Negative.   Psychiatric/Behavioral: Negative.        Physical Exam Blood pressure 132/72, pulse 90, temperature 98.1 F (36.7 C), temperature source Oral, height 6' (1.829 m), weight 207 lb (93.9 kg), SpO2 96 %. Last Weight  Most recent update: 05/09/2022 11:26 AM    Weight  93.9 kg (207 lb)             CONSTITUTIONAL: Well developed, and nourished, appropriately responsive and aware without distress.   EYES: Sclera non-icteric.   EARS, NOSE, MOUTH AND THROAT:  The oropharynx is clear. Oral mucosa is pink and moist.   Hearing is intact to voice.  NECK: Trachea is midline, and there is no jugular venous distension.  LYMPH NODES:  Lymph nodes in the neck are not enlarged. RESPIRATORY:  Lungs are clear, and breath sounds are equal bilaterally. Normal respiratory effort without pathologic use of accessory muscles. CARDIOVASCULAR: Heart is regular in rate and rhythm. GI: The abdomen is  soft, nontender, and nondistended. There were no palpable masses. I did not appreciate hepatosplenomegaly. There were normal bowel sounds. GU: I failed to examine the prostate, and did not perform DRE not appreciating the findings on the CT scan examination prior to the patient's departure. He has a readily apparent left groin hernia which is reducible.  I cannot appreciate a right groin hernia on Valsalva.  Testes are descended and equal in size.  MUSCULOSKELETAL:  Symmetrical muscle tone appreciated in all four extremities.    SKIN: Skin turgor is  normal. No pathologic skin lesions appreciated.  NEUROLOGIC:  Motor and sensation appear grossly normal.  Cranial nerves are grossly without defect. PSYCH:  Alert and oriented to person, place and time. Affect is appropriate for situation.  Data Reviewed I have personally reviewed what is currently available of the patient's imaging, recent labs and medical records.   Labs:     Latest Ref Rng & Units 05/07/2022    9:58 AM 05/19/2021   12:13 PM 01/30/2020    3:29 PM  CBC  WBC 4.0 - 10.5 K/uL 10.1  8.4  8.8   Hemoglobin 13.0 - 17.0 g/dL 17.0  16.7  16.2   Hematocrit 39.0 - 52.0 %  51.1  48.8  48.5   Platelets 150 - 400 K/uL 292  300  252       Latest Ref Rng & Units 05/07/2022    9:58 AM 02/20/2022    2:17 PM 11/18/2021   10:39 AM  CMP  Glucose 70 - 99 mg/dL 132  144  150   BUN 8 - 23 mg/dL _0 Creatinine 0.61 - 1.24 mg/dL 1.33  1.38  1.05   Sodium 135 - 145 mmol/L 138  138  141   Potassium 3.5 - 5.1 mmol/L 3.9  4.2  4.3   Chloride 98 - 111 mmol/L 106  108  108   CO2 22 - 32 mmol/L _1 Calcium 8.9 - 10.3 mg/dL 9.3  9.6  9.8   Total Protein 6.5 - 8.1 g/dL 7.7  6.5  7.2   Total Bilirubin 0.3 - 1.2 mg/dL 0.9  0.6  0.4   Alkaline Phos 38 - 126 U/L 57     AST 15 - 41 U/L _2 ALT 0 - 44 U/L 25  24  40       Imaging: Radiology review:  CLINICAL DATA:  Left inguinal hernia. Abdominal pain and slight bulge in the left lower abdomen. Bowel obstruction suspected.   EXAM: CT ABDOMEN AND PELVIS WITH CONTRAST   TECHNIQUE: Multidetector CT imaging of the abdomen and pelvis was performed using the standard protocol following bolus administration of intravenous contrast.   RADIATION DOSE REDUCTION: This exam was performed according to the departmental dose-optimization program which includes automated exposure control, adjustment of the mA and/or kV according to patient size and/or use of iterative reconstruction technique.   CONTRAST:  147m OMNIPAQUE  IOHEXOL 300 MG/ML  SOLN   COMPARISON:  None Available.   FINDINGS: Lower chest: Bibasilar dependent atelectasis.  No acute abnormality.   Hepatobiliary: No focal liver abnormality is seen. No gallstones, gallbladder wall thickening, or biliary dilatation.   Pancreas: Unremarkable. No pancreatic ductal dilatation or surrounding inflammatory changes.   Spleen: Normal in size without focal abnormality.   Adrenals/Urinary Tract: Adrenal glands are unremarkable. Kidneys are normal, without renal calculi, focal lesion, or hydronephrosis. Bladder is unremarkable.   Stomach/Bowel: Stomach is within normal limits. Appendix appears normal. No evidence of bowel wall thickening, distention, or inflammatory changes.   Vascular/Lymphatic: Aortic atherosclerosis. No enlarged abdominal or pelvic lymph nodes.   Reproductive: Prostate is enlarged and protrudes into the base of the bladder. The prostate measures approximately 5.5 x 4.2 cm.   Other: There is a left inguinal hernia containing small portion of the small bowel. No evidence of bowel obstruction. No fat stranding in the hernial sac.   Musculoskeletal: Partial interbody fusion of L4 and L5 vertebral body. Degenerate disc disease at L3-L4 and L5-S1 with bulky osteophytes. Facet joint arthropathy at L5-S1. No acute osseous abnormality.   IMPRESSION: 1. Small left inguinal hernia containing small portion of the small bowel without evidence of obstruction or inflammatory changes in the hernial sac.   2. Enlarged prostate measuring 5.5 x 4.2 cm, the prostate protrudes into the urinary bladder. Correlate with PSA and prostate MRI examination could be considered for further evaluation, if clinically warranted.   3. Bowel loops are normal in caliber. Normal appendix. No evidence of colitis or diverticulitis.   4. Partial interbody fusion at L4-L5, likely congenital process. Degenerate disc disease with osteophytes at L3-L4 and  L5-S1  with bulky osteophytes. Prominent facet joint arthropathy at L5-S1. No acute osseous abnormality.     Electronically Signed   By: Keane Police D.O.   On: 05/07/2022 11:36 Within last 24 hrs: No results found.  Assessment    Left inguinal hernia. Prostatic abnormality is noted on CT. Patient Active Problem List   Diagnosis Date Noted   Aortic atherosclerosis ([place]) 02/24/2022   Smokes with greater than 30 pack year history 11/18/2021   Coronary artery disease of native artery of native heart with stable angina pectoris ([place]) 05/19/2021   Dyslipidemia 07/30/2020   Polyp of colon    Diverticulosis of large intestine without diverticulitis    Low HDL (under 40) 05/13/2018   Type 2 diabetes mellitus, controlled ([place]) 02/08/2018   History of myocardial infarction 11/08/2017   Tobacco use 11/08/2017   Ischemic cardiomyopathy 11/08/2017   CHF (congestive heart failure) ([place]) 11/08/2017   Foot callus 11/08/2017   Onychomycosis of multiple toenails with type 2 diabetes mellitus ([place]) 11/08/2017   Cataract, nuclear sclerotic, left eye 04/04/2016    Plan    Referral to urology. Robotic repair of left inguinal hernia.  I discussed possibility of incarceration, strangulation, enlargement in size over time, and the need for emergency surgery in the face of these.  Also reviewed the techniques of reduction should incarceration occur, and when unsuccessful to present to the ED.  Also discussed that surgery risks include recurrence which can be up to 30% in the case of complex hernias, use of prosthetic materials (mesh) and the increased risk of infection and the possible need for re-operation and removal of mesh, possibility of post-op SBO or ileus, and the risks of general anesthetic including heart attack, stroke, sudden death or some reaction to anesthetic medications. The patient, and those present, appear to understand the risks, any and all questions were answered to the patient's  satisfaction.  No guarantees were ever expressed or implied.   Face-to-face time spent with the patient and accompanying care providers(if present) was 30 minutes, with more than 50% of the time spent counseling, educating, and coordinating care of the patient.    These notes generated with voice recognition software. I apologize for typographical errors.  Ronny Bacon M.D., FACS 05/09/2022, 12:03 PM

## 2022-05-09 NOTE — Patient Instructions (Addendum)
Our surgery scheduler Barbara will call you within 24-48 hours to get you scheduled. If you have not heard from her after 48 hours, please call our office. Have the blue sheet available when she calls to write down important information.   If you have any concerns or questions, please feel free to call our office.  Inguinal Hernia, Adult An inguinal hernia is when fat or your intestines push through a weak spot in a muscle where your leg meets your lower belly (groin). This causes a bulge. This kind of hernia could also be: In your scrotum, if you are male. In folds of skin around your vagina, if you are male. There are three types of inguinal hernias: Hernias that can be pushed back into the belly (are reducible). This type rarely causes pain. Hernias that cannot be pushed back into the belly (are incarcerated). Hernias that cannot be pushed back into the belly and lose their blood supply (are strangulated). This type needs emergency surgery. What are the causes? This condition is caused by having a weak spot in the muscles or tissues in your groin. This develops over time. The hernia may poke through the weak spot when you strain your lower belly muscles all of a sudden, such as when you: Lift a heavy object. Strain to poop (have a bowel movement). Trouble pooping (constipation) can lead to straining. Cough. What increases the risk? This condition is more likely to develop in: Males. Pregnant females. People who: Are overweight. Work in jobs that require long periods of standing or heavy lifting. Have had an inguinal hernia before. Smoke or have lung disease. These factors can lead to long-term (chronic) coughing. What are the signs or symptoms? Symptoms may depend on the size of the hernia. Often, a small hernia has no symptoms. Symptoms of a larger hernia may include: A bulge in the groin area. This is easier to see when standing. You might not be able to see it when you are lying  down. Pain or burning in the groin. This may get worse when you lift, strain, or cough. A dull ache or a feeling of pressure in the groin. An abnormal bulge in the scrotum, in males. Symptoms of a strangulated inguinal hernia may include: A bulge in your groin that is very painful and tender to the touch. A bulge that turns red or purple. Fever, feeling like you may vomit (nausea), and vomiting. Not being able to poop or to pass gas. How is this treated? Treatment depends on the size of your hernia and whether you have symptoms. If you do not have symptoms, your doctor may have you watch your hernia carefully and have you come in for follow-up visits. If your hernia is large or if you have symptoms, you may need surgery to repair the hernia. Follow these instructions at home: Lifestyle Avoid lifting heavy objects. Avoid standing for long amounts of time. Do not smoke or use any products that contain nicotine or tobacco. If you need help quitting, ask your doctor. Stay at a healthy weight. Prevent trouble pooping You may need to take these actions to prevent or treat trouble pooping: Drink enough fluid to keep your pee (urine) pale yellow. Take over-the-counter or prescription medicines. Eat foods that are high in fiber. These include beans, whole grains, and fresh fruits and vegetables. Limit foods that are high in fat and sugar. These include fried or sweet foods. General instructions You may try to push your hernia back in place   by very gently pressing on it when you are lying down. Do not try to push the bulge back in if it will not go in easily. Watch your hernia for any changes in shape, size, or color. Tell your doctor if you see any changes. Take over-the-counter and prescription medicines only as told by your doctor. Keep all follow-up visits. Contact a doctor if: You have a fever or chills. You have new symptoms. Your symptoms get worse. Get help right away if: You have pain  in your groin that gets worse all of a sudden. You have a bulge in your groin that: Gets bigger all of a sudden, and it does not get smaller after that. Turns red or purple. Is painful when you touch it. You are a male, and you have: Sudden pain in your scrotum. A sudden change in the size of your scrotum. You cannot push the hernia back in place by very gently pressing on it when you are lying down. You feel like you may vomit, and that feeling does not go away. You keep vomiting. You have a fast heartbeat. You cannot poop or pass gas. These symptoms may be an emergency. Get help right away. Call your local emergency services (911 in the [place] Do not wait to see if the symptoms will go away. Do not drive yourself to the hospital. Summary An inguinal hernia is when fat or your intestines push through a weak spot in a muscle where your leg meets your lower belly (groin). This causes a bulge. If you do not have symptoms, you may not need treatment. If you have symptoms or a large hernia, you may need surgery. Avoid lifting heavy objects. Also, avoid standing for long amounts of time. Do not try to push the bulge back in if it will not go in easily. This information is not intended to replace advice given to you by your health care provider. Make sure you discuss any questions you have with your health care provider. Document Revised: 04/06/2020 Document Reviewed: 04/06/2020 Elsevier Patient Education  2023 Elsevier Inc.  

## 2022-05-15 ENCOUNTER — Encounter
Admission: RE | Admit: 2022-05-15 | Discharge: 2022-05-15 | Disposition: A | Discharge status: Home / Self Care | Payer: 59 | Source: Ambulatory Visit | Attending: Surgery | Admitting: Surgery

## 2022-05-15 ENCOUNTER — Telehealth: Payer: Self-pay | Admitting: *Deleted

## 2022-05-15 ENCOUNTER — Ambulatory Visit: Payer: 59 | Attending: Nurse Practitioner | Admitting: Nurse Practitioner

## 2022-05-15 ENCOUNTER — Encounter: Payer: Self-pay | Admitting: Nurse Practitioner

## 2022-05-15 DIAGNOSIS — Z01818 Encounter for other preprocedural examination: Secondary | ICD-10-CM

## 2022-05-15 DIAGNOSIS — Z01810 Encounter for preprocedural cardiovascular examination: Secondary | ICD-10-CM

## 2022-05-15 HISTORY — DX: Diverticulosis of large intestine without perforation or abscess without bleeding: K57.30

## 2022-05-15 HISTORY — DX: Unspecified osteoarthritis, unspecified site: M19.90

## 2022-05-15 HISTORY — DX: Atherosclerosis of aorta: I70.0

## 2022-05-15 NOTE — Telephone Encounter (Signed)
Pt agreeable to plan of care tele pre op appt add on today, due to surgery date and ASA hold time.    Pt states he has taken ASA today. Pt has been advised to not take anymore ASA until after his procedure, which the surgeon will then advise when to resume ASA. Pt added on to 05/15/22 @ 3:40.    Med rec and consent are done.

## 2022-05-15 NOTE — Patient Instructions (Signed)
Your procedure is scheduled on:05-19-22 Friday Report to the Registration Desk on the 1st floor of the [facility].Then proceed to the 2nd floor Surgery Desk To find out your arrival time, please call [phone] between 1PM - 3PM on:05-18-22 Thursday If your arrival time is 6:00 am, do not arrive prior to that time as the [facility] entrance doors do not open until 6:00 am.  REMEMBER: Instructions that are not followed completely may result in serious medical risk, up to and including death; or upon the discretion of your surgeon and anesthesiologist your surgery may need to be rescheduled.  Do not eat food OR drink any liquids after midnight the night before surgery.  No gum chewing, lozengers or hard candies.  TAKE THESE MEDICATIONS THE MORNING OF SURGERY WITH A SIP OF WATER: -carvedilol (COREG)  Stop your metFORMIN (GLUCOPHAGE) 2 days prior to surgery-Last dose on 05-16-22 (Tuesday)  Stop your empagliflozin (JARDIANCE) 3 days prior to surgery-Last dose today (05-15-22) Monday  Call Dr Forest Becker office today (05-15-22) about when you need to stop your 81 mg Aspirin  One week prior to surgery: Stop Anti-inflammatories (NSAIDS) such as Advil, Aleve, Ibuprofen, Motrin, Naproxen, Naprosyn and Aspirin based products such as Excedrin, Goodys Powder, BC Powder.You may however, continue to take Tylenol if needed for pain up until the day of surgery.  Stop ANY OVER THE COUNTER supplements/vitamins NOW (05-15-22) until after surgery.  No Alcohol for 24 hours before or after surgery.  No Smoking including e-cigarettes for 24 hours prior to surgery.  No chewable tobacco products for at least 6 hours prior to surgery.  No nicotine patches on the day of surgery.  Do not use any "recreational" drugs for at least a week prior to your surgery.  Please be advised that the combination of cocaine and anesthesia may have negative outcomes, up to and including death. If you test positive for cocaine,  your surgery will be cancelled.  On the morning of surgery brush your teeth with toothpaste and water, you may rinse your mouth with mouthwash if you wish. Do not swallow any toothpaste or mouthwash.  Use CHG Soap as directed on instruction sheet.  Do not wear jewelry, make-up, hairpins, clips or nail polish.  Do not wear lotions, powders, or perfumes.   Do not shave body from the neck down 48 hours prior to surgery just in case you cut yourself which could leave a site for infection.  Also, freshly shaved skin may become irritated if using the CHG soap.  Contact lenses, hearing aids and dentures may not be worn into surgery.  Do not bring valuables to the hospital. [facility] is not responsible for any missing/lost belongings or valuables.  Notify your doctor if there is any change in your medical condition (cold, fever, infection).  Wear comfortable clothing (specific to your surgery type) to the hospital.  After surgery, you can help prevent lung complications by doing breathing exercises.  Take deep breaths and cough every 1-2 hours. Your doctor may order a device called an Incentive Spirometer to help you take deep breaths. When coughing or sneezing, hold a pillow firmly against your incision with both hands. This is called "splinting." Doing this helps protect your incision. It also decreases belly discomfort.  If you are being admitted to the hospital overnight, leave your suitcase in the car. After surgery it may be brought to your room.  If you are being discharged the day of surgery, you will not be allowed to  drive home. You will need a responsible adult (18 years or older) to drive you home and stay with you that night.   If you are taking public transportation, you will need to have a responsible adult (18 years or older) with you. Please confirm with your physician that it is acceptable to use public transportation.   Please call the [facility] Dept[facility] at  [phone] if you have any questions about these instructions.  Surgery Visitation Policy:  Patients undergoing a surgery or procedure may have two family members or support persons with them as long as the person is not COVID-19 positive or experiencing its symptoms.

## 2022-05-15 NOTE — Telephone Encounter (Signed)
Request for pre-operative cardiac clearance Received: Today Karen Kitchens, NP  P Cv Div Preop Callback Request for pre-operative cardiac clearance:     1. What type of surgery is being performed?  ROBOTIC ASSISTED INGUINAL HERNIA   2. When is this surgery scheduled?  05/19/2022     3. Type of clearance being requested (medical, pharmacy, both).  BOTH     4. Are there any medications that need to be held prior to surgery?  ASA   5. Practice name and name of physician performing surgery?  Performing surgeon: Dr. Ronny Bacon, MD  Requesting clearance: Honor Loh, FNP-C       6. Anesthesia type (none, local, MAC, general)?  GENERAL   7. What is the office phone and fax number?    Phone: [phone]  Fax: [phone]   ATTENTION: Unable to create telephone message as per your standard workflow. Directed by [facility] providers to send requests for cardiac clearance to this pool for appropriate distribution to provider covering pre-operative clearances.   Honor Loh, MSN, APRN, FNP-C, CEN  [facility]  Peri-operative Services Nurse Practitioner  Phone: [phone]  05/15/22 12:00 PM

## 2022-05-15 NOTE — Telephone Encounter (Signed)
Pt agreeable to plan of care tele pre op appt add on today, due to surgery date and ASA hold time.   Pt states he has taken ASA today. Pt has been advised to not take anymore ASA until after his procedure, which the surgeon will then advise when to resume ASA. Pt added on to 05/15/22 @ 3:40.   Med rec and consent are done.      Patient Consent for Virtual Visit        [name] has provided verbal consent on 05/15/2022 for a virtual visit (video or telephone).   CONSENT FOR VIRTUAL VISIT FOR:  Lawrence Holmes  By participating in this virtual visit I agree to the following:  I hereby voluntarily request, consent and authorize [facility] and its employed or contracted physicians, physician assistants, nurse practitioners or other licensed health care professionals (the Practitioner), to provide me with telemedicine health care services (the "Services") as deemed necessary by the treating Practitioner. I acknowledge and consent to receive the Services by the Practitioner via telemedicine. I understand that the telemedicine visit will involve communicating with the Practitioner through live audiovisual communication technology and the disclosure of certain medical information by electronic transmission. I acknowledge that I have been given the opportunity to request an in-person assessment or other available alternative prior to the telemedicine visit and am voluntarily participating in the telemedicine visit.  I understand that I have the right to withhold or withdraw my consent to the use of telemedicine in the course of my care at any time, without affecting my right to future care or treatment, and that the Practitioner or I may terminate the telemedicine visit at any time. I understand that I have the right to inspect all information obtained and/or recorded in the course of the telemedicine visit and may receive copies of available information for a reasonable fee.  I understand  that some of the potential risks of receiving the Services via telemedicine include:  Delay or interruption in medical evaluation due to technological equipment failure or disruption; Information transmitted may not be sufficient (e.g. poor resolution of images) to allow for appropriate medical decision making by the Practitioner; and/or  In rare instances, security protocols could fail, causing a breach of personal health information.  Furthermore, I acknowledge that it is my responsibility to provide information about my medical history, conditions and care that is complete and accurate to the best of my ability. I acknowledge that Practitioner's advice, recommendations, and/or decision may be based on factors not within their control, such as incomplete or inaccurate data provided by me or distortions of diagnostic images or specimens that may result from electronic transmissions. I understand that the practice of medicine is not an exact science and that Practitioner makes no warranties or guarantees regarding treatment outcomes. I acknowledge that a copy of this consent can be made available to me via my patient portal ([facility]), or I can request a printed copy by calling the office of [facility].    I understand that my insurance will be billed for this visit.   I have read or had this consent read to me. I understand the contents of this consent, which adequately explains the benefits and risks of the Services being provided via telemedicine.  I have been provided ample opportunity to ask questions regarding this consent and the Services and have had my questions answered to my satisfaction. I give my informed consent for the services  to be provided through the use of telemedicine in my medical care

## 2022-05-15 NOTE — Telephone Encounter (Signed)
   Name: Lawrence Holmes  DOB: 1955/09/01  MRN: [id number]  Primary Cardiologist: Kathlyn Sacramento, MD   Preoperative team, please contact this patient and set up a phone call appointment for further preoperative risk assessment. Please obtain consent and complete medication review. Thank you for your help.  I confirm that guidance regarding antiplatelet and oral anticoagulation therapy has been completed and, if necessary, noted below.  Per office protocol, if patient is without any new symptoms or concerns at the time of their virtual visit, he/she may hold Aspiring for 5-7 days prior to procedure. Please resume Aspiring as soon as possible postprocedure, at the discretion of the surgeon.     Lenna Sciara, NP 05/15/2022, 12:43 PM [place]

## 2022-05-15 NOTE — Progress Notes (Signed)
Virtual Visit via Telephone Note   Because of Lawrence Holmes's co-morbid illnesses, he is at least at moderate risk for complications without adequate follow up.  This format is felt to be most appropriate for this patient at this time.  The patient did not have access to video technology/had technical difficulties with video requiring transitioning to audio format only (telephone).  All issues noted in this document were discussed and addressed.  No physical exam could be performed with this format.  Please refer to the patient's chart for his consent to telehealth for [facility].  Evaluation Performed:  Preoperative cardiovascular risk assessment _____________   Date:  05/15/2022   Patient ID:  Lawrence Holmes, DOB 06/06/1956, MRN [id number] Patient Location:  Home Provider location:   Office  Primary Care Provider:  Delsa Grana, PA-C Primary Cardiologist:  Kathlyn Sacramento, MD  Chief Complaint / Patient Profile   66 y.o. y/o male with a h/o CAD managed medically, chronic systolic heart failure, ICM, mild mitral valve regurgitation, hyperlipidemia, and type 2 diabetes who is pending robotic assisted inguinal hernia repair on 05/19/2022 with Dr. Ronny Bacon and presents today for telephonic preoperative cardiovascular risk assessment.  Past Medical History    Past Medical History:  Diagnosis Date   Aortic atherosclerosis ([place])    Arthritis    Chronic systolic heart failure ([place]) 07/2017   a. 07/2017 Echo: EF 30-35%, Gr1 DD, inflat, and inf AK, mild to mod MR, mildly dil LA/RA; b. 10/2017 Echo: EF 40-45%, basal-midinf and infsept AK. Mild LVH. Mild MR. Nl RV fxn.   Coronary artery disease 07/2017   a. 07/2017 Late presenting inferior STEMI/Cath: LM nl, LAD min irregs, LCX 100d (L->L collats), RCA 100p (L->R collats), EF 25-35%-->Med Rx; b. 10/2017 ETT (DOT): Ex time 7:38, baseline antlat TWI, no acute changes.    Diabetes mellitus without complication ([place])    Diverticulosis  of colon    Hyperlipidemia    Ischemic cardiomyopathy    a. 07/2017 Echo: EF 30-35%; b. 10/2017 Echo: EF 40-45%.   Left inguinal hernia    STEMI (ST elevation myocardial infarction) ([place]) 2018   Tobacco use    Past Surgical History:  Procedure Laterality Date   COLONOSCOPY WITH PROPOFOL N/A 04/04/2019   Procedure: COLONOSCOPY WITH PROPOFOL;  Surgeon: Virgel Manifold, MD;  Location: [facility] ENDOSCOPY;  Service: Endoscopy;  Laterality: N/A;   EYE SURGERY Bilateral    Cataracts   LEFT HEART CATH AND CORONARY ANGIOGRAPHY N/A 07/21/2017   Procedure: LEFT HEART CATH AND CORONARY ANGIOGRAPHY;  Surgeon: Wellington Hampshire, MD;  Location: [facility];  Service: Cardiovascular;  Laterality: N/A;    Allergies  No Known Allergies  History of Present Illness    Lawrence Holmes is a 66 y.o. male who presents via [employer] (consulting) for a telehealth visit today.  Pt was last seen in cardiology clinic on 11/11/2021 by Cadence Kathlen Mody, PA.  At that time Lawrence Holmes was doing well.  The patient is now pending procedure as outlined above. Since his last visit, he has done well from a cardiac standpoint. He denies chest pain, palpitations, dyspnea, pnd, orthopnea, n, v, dizziness, syncope, edema, weight gain, or early satiety. All other systems reviewed and are otherwise negative except as noted above.   Home Medications    Prior to Admission medications   Medication Sig Start Date End Date Taking? Authorizing Provider  ACCU-CHEK GUIDE test strip SMARTSIG:Via Meter 1-4 Times Daily 11/25/21  [provider]  Accu-Chek Softclix Lancets lancets SMARTSIG:Via Meter 1-4 Times Daily 11/25/21   [provider]  aspirin 81 MG chewable tablet Chew 1 tablet (81 mg total) by mouth daily. 07/24/17   Demetrios Loll, MD  atorvastatin (LIPITOR) 80 MG tablet Take 1 tablet (80 mg total) by mouth at bedtime. 11/18/21   Furth, Cadence H, PA-C  Blood Gluc Meter Disp-Strips (BLOOD GLUCOSE METER  DISPOSABLE) DEVI Check fingerstick blood sugars once a day; E11.69, LON 99 months 05/13/18   Arnetha Courser, MD  blood glucose meter kit and supplies KIT Dispense based on patient and insurance preference. Use up to four times daily as directed. (FOR ICD-9 250.00, 250.01). 11/23/21   Delsa Grana, PA-C  carvedilol (COREG) 3.125 MG tablet Take 1 tablet (3.125 mg total) by mouth 2 (two) times daily with a meal. 11/18/21   Furth, Cadence H, PA-C  empagliflozin (JARDIANCE) 10 MG TABS tablet Take 1 tablet (10 mg total) by mouth daily before breakfast. 02/24/22   Delsa Grana, PA-C  metFORMIN (GLUCOPHAGE) 500 MG tablet Take 1 tablet (500 mg total) by mouth 2 (two) times daily with a meal. 02/24/22   Delsa Grana, PA-C  sacubitril-valsartan (ENTRESTO) 24-26 MG Take 1 tablet by mouth 2 (two) times daily. 11/18/21   Furth, Cadence H, PA-C  sildenafil (REVATIO) 20 MG tablet TAKE 1-3 TABLETS (20-60 MG TOTAL) BY MOUTH DAILY AS NEEDED (USE 30 MIN PRIOR TO SEXUAL ACTIVITY). 06/13/21   Delsa Grana, PA-C  spironolactone (ALDACTONE) 25 MG tablet Take 1 tablet (25 mg total) by mouth daily. Patient taking differently: Take 25 mg by mouth every morning. 11/18/21   Kathlen Mody, Cadence H, PA-C    Physical Exam    Vital Signs:  Lawrence Holmes does not have vital signs available for review today.  Given telephonic nature of communication, physical exam is limited. AAOx3. NAD. Normal affect.  Speech and respirations are unlabored.  Accessory Clinical Findings    None  Assessment & Plan    1.  Preoperative Cardiovascular Risk Assessment:  According to the Revised Cardiac Risk Index (RCRI), his Perioperative Risk of Major Cardiac Event is (%): 6.6. His Functional Capacity in METs is: 5.62 according to the [facility] Activity Status Index (DASI). Therefore, based on ACC/AHA guidelines, patient would be at acceptable risk for the planned procedure without further cardiovascular testing.  The patient was advised that if he develops new  symptoms prior to surgery to contact our office to arrange for a follow-up visit, and he verbalized understanding.  Per office protocol, he may hold Aspirin for 5-7 days prior to procedure. Please resume Aspiring as soon as possible postprocedure, at the discretion of the surgeon.     A copy of this note will be routed to requesting surgeon.  Time:   Today, I have spent 6 minutes with the patient with telehealth technology discussing medical history, symptoms, and management plan.     Lenna Sciara, NP  05/15/2022, 3:48 PM

## 2022-05-16 ENCOUNTER — Encounter: Payer: Self-pay | Admitting: Surgery

## 2022-05-16 NOTE — Progress Notes (Signed)
Perioperative Services  Pre-Admission/Anesthesia Testing Clinical Review  Date: 05/18/22  Patient Demographics:  Name: Lawrence Holmes DOB:   11/29/1955 MRN:   [id number]  Planned Surgical Procedure(s):    Case: [id number] Date/Time: 05/19/22 1205   Procedure: XI ROBOTIC ASSISTED INGUINAL HERNIA (Left)   Anesthesia type: General   Pre-op diagnosis: left inguinal hernia   Location: [facility] OR ROOM 04 / [place] ORS FOR ANESTHESIA GROUP   Surgeons: Ronny Bacon, MD   NOTE: Available PAT nursing documentation and vital signs have been reviewed. Clinical nursing staff has updated patient's PMH/PSHx, current medication list, and drug allergies/intolerances to ensure comprehensive history available to assist in medical decision making as it pertains to the aforementioned surgical procedure and anticipated anesthetic course. Extensive review of available clinical information performed. [facility] and PSHx updated with any diagnoses/procedures that  may have been inadvertently omitted during his intake with the pre-admission testing department's nursing staff.  Clinical Discussion:  Lawrence Holmes is a 66 y.o. male who is submitted for pre-surgical anesthesia review and clearance prior to him undergoing the above procedure. Patient is a Current Smoker (44 pack years). Pertinent PMH includes: CAD, STEMI, cardiomyopathy, CHF, aortic atherosclerosis, HTN, HLD, T2DM, OA, lumbar DDD, BPH, ED (on PDE5i).  Patient is followed by cardiology Fletcher Anon, MD). He was last seen in the cardiology clinic on 11/11/2021; notes reviewed.  At the time of his clinic visit, patient doing well overall from a cardiovascular perspective.  He denied any episodes of chest pain, shortness breath, PND, orthopnea, palpitations, significant peripheral edema, vertiginous symptoms, or presyncope/syncope.  Patient with a past medical history significant for cardiovascular diagnoses.  Patient suffered an inferior wall STEMI on 07/21/2017.   Diagnostic left heart catheterization at that time revealed a severely reduced left ventricular systolic function with an EF of 25-35%.  LVEDP elevated at 14 mmHg.  There was multivessel CAD; 40% proximal LCx, 40% mid LCx, 100% distal LCx, and 100% distal RCA.  PCI was performed with attempts to cross the wire in the RCA lesion, however attempts were unsuccessful.  Patient with reasonable RCA collaterals that had already formed.  The decision was made to defer further intervention opting for medical management.  Patient with a history of ischemic cardiomyopathy following STEMI in 07/2017.  EF at time of diagnosis was 25-35%.  Since that time, cardiac function has been monitored.  Last TTE performed on 11/19/2021 revealed a improved left ventricular systolic function with an EF of 45-50%.  There was global hypokinesis with moderate to severe inferior and basal to mid septal hypokinesis. Left ventricular diastolic Doppler parameters consistent with abnormal relaxation (G1DD). GLS -15.3%. There was mild mitral valve regurgitation.  There was no evidence of a significant transvalvular gradient to suggest stenosis.  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  Exercise time was 7 minutes and 38 seconds.  There were baseline and lateral T wave inversions with no acute changes noted during the study.  Heart failure and blood pressure well managed on currently prescribed beta-blocker (carvedilol), diuretic (spironolactone), and ARB/ANRi (Entresto) therapies; blood pressure documented at 120/64 mmHg.  Patient is on atorvastatin for his HLD diagnosis and further ASCVD prevention.  In the setting of known cardiovascular disease, it is important to note that patient is on a PDE5i (sildenafil) for his erectile dysfunction diagnosis.  T2DM well-controlled suboptimally controlled on currently prescribed regimen.  A1c at the time of his office visit was 8.3%.  Value has been rechecked  since  on 02/20/2022, with reduction to 6.0%.  In the setting of heart failure and known diabetes diagnosis, patient is on an SGLT2i (empagliflozin).  Patient does not have an OSAH diagnosis.  Patient with a "fairly active" lifestyle. Functional capacity, as defined by DASI, is documented as being >/= 4 METS.  No's were made to his medication regimen.  Patient to follow-up with outpatient cardiology in 1 year or sooner if needed.  Lawrence Holmes is scheduled for an elective XI ROBOTIC ASSISTED LEFT INGUINAL HERNIA REPAIR on 05/19/2022 with Dr. Ronny Bacon, MD. Given patient's past medical history significant for cardiovascular diagnoses, presurgical cardiac clearance was sought by the PAT team. Per cardiology, "according to the RCRI, his perioperative risk of MACE is 6.6%. His functional capacity in METs is 5.62 according to the DASI. Therefore, based on ACC/AHA guidelines, patient would be at an ACCEPTABLE risk for the planned procedure without further cardiovascular testing".  In review of his medication reconciliation, it is noted the patient is on daily antiplatelet therapy.  He has been instructed on recommendations from his cardiologist and general surgeon regarding the need to hold his daily low-dose ASA for 5 days prior to his procedure with plans to restart her since postoperative bleeding respectively minimized by his primary attending surgeon.  The patient is aware that his last dose of ASA should be on 05/13/2022.[name]  Patient denies previous perioperative complications with anesthesia in the past. In review of the available records, it is noted that patient underwent a general anesthetic course here at [facility] ([facility]) in 03/2019 without documented complications.      05/09/2022   11:26 AM 05/07/2022   12:30 PM 05/07/2022   10:30 AM  Vitals with BMI  Height $Remov'6\' 0"'NtptEr$     Weight 207 lbs    BMI 78.29    Systolic 562 130 865  Diastolic 72 74 75  Pulse 90 61 61     Providers/Specialists:   NOTE: Primary physician provider listed below. Patient may have been seen by APP or partner within same practice.   PROVIDER ROLE / SPECIALTY LAST Katy Apo, MD General Surgery (Surgeon) 05/09/2022  Delsa Grana, PA-C Primary Care Provider 02/24/2022  Kathlyn Sacramento, MD Cardiology 11/11/2021   Allergies:  Patient has no known allergies.  Current Home Medications:   No current facility-administered medications for this encounter.    ACCU-CHEK GUIDE test strip   Accu-Chek Softclix Lancets lancets   aspirin 81 MG chewable tablet   atorvastatin (LIPITOR) 80 MG tablet   Blood Gluc Meter Disp-Strips (BLOOD GLUCOSE METER DISPOSABLE) DEVI   blood glucose meter kit and supplies KIT   carvedilol (COREG) 3.125 MG tablet   empagliflozin (JARDIANCE) 10 MG TABS tablet   metFORMIN (GLUCOPHAGE) 500 MG tablet   sacubitril-valsartan (ENTRESTO) 24-26 MG   sildenafil (REVATIO) 20 MG tablet   spironolactone (ALDACTONE) 25 MG tablet   History:   Past Medical History:  Diagnosis Date   Aortic atherosclerosis (HCC)    Arthritis    Chronic systolic heart failure ([place]) 07/23/2017   a.) TTE 07/23/2017: EF 30-35%, mild LVH, inferolateral and inferior AK, mild BAE, G1DD; b.) TTE 10/23/2017: EF 40-45%, mild LVH, basal-mid inferior and inferoseptal AK, mild MR; c.) TTE 11/19/2021: EF 45-50%, glob HK with mod to severe inferior and basal to mid septal HK, GLS -15.3%, mild MR, G1DD.   Coronary artery disease 07/21/2017   a. 07/2017 Late presenting inferior STEMI/Cath: LM nl, LAD min irregs, LCX  100d (L->L collats), RCA 100p (L->R collats), EF 25-35%-->Med Rx; b. 10/2017 ETT (DOT): Ex time 7:38, baseline antlat TWI, no acute changes.    DDD (degenerative disc disease), lumbar    Diverticulosis of colon    Enlarged prostate    Erectile dysfunction    a.) on PDE5i (sildenafil) PRN   HTN (hypertension)    Hx of bilateral cataract extraction    Hyperlipidemia     Ischemic cardiomyopathy    a.) LHC 07/21/2017: EF 25-35%; b.) TTE 07/23/2017: 30-35%; c.) TTE 10/23/2017: EF 40-45%; d.) TTE 11/19/2021: EF 45-50%   Left inguinal hernia    ST elevation myocardial infarction (STEMI) of inferior wall ([place]) 07/21/2017   a.) LHC 07/21/2017: EF 25-35%, LVEDP 14 mmHg. 40% pLCx, 40% mLCx, 100% dLCx, 100% pRCA --> attempted to cross wire at RCA lesion, however unable. Reasonable RCA collaterals already formed --> med mgmt.   T2DM (type 2 diabetes mellitus) ([place])    Tobacco use    Past Surgical History:  Procedure Laterality Date   CATARACT EXTRACTION Bilateral    COLONOSCOPY WITH PROPOFOL N/A 04/04/2019   Procedure: COLONOSCOPY WITH PROPOFOL;  Surgeon: Virgel Manifold, MD;  Location: [facility] ENDOSCOPY;  Service: Endoscopy;  Laterality: N/A;   LEFT HEART CATH AND CORONARY ANGIOGRAPHY N/A 07/21/2017   Procedure: LEFT HEART CATH AND CORONARY ANGIOGRAPHY;  Surgeon: Wellington Hampshire, MD;  Location: [place] CV LAB;  Service: Cardiovascular;  Laterality: N/A;   Family History  Problem Relation Age of Onset   Heart disease Mother    Hyperlipidemia Mother    Hypertension Mother    Heart attack Sister    Social History   Tobacco Use   Smoking status: Every Day    Packs/day: 1.00    Years: 44.00    Total pack years: 44.00    Types: Cigarettes    Start date: 08/21/1974   Smokeless tobacco: Never   Tobacco comments:    Started smoking around 66 y/o has stopped several times  Vaping Use   Vaping Use: Never used  Substance Use Topics   Alcohol use: Yes    Comment: rare   Drug use: No    Pertinent Clinical Results:  LABS: Labs reviewed: Acceptable for surgery.  No visits with results within 3 Day(s) from this visit.  Latest known visit with results is:  Admission on 05/07/2022, Discharged on 05/07/2022  Component Date Value Ref Range Status   Lipase 05/07/2022 49  11 - 51 U/L Final   Performed at [facility], [place], [place] [address]   Sodium 05/07/2022 138  135 - 145 mmol/L Final   Potassium 05/07/2022 3.9  3.5 - 5.1 mmol/L Final   Chloride 05/07/2022 106  98 - 111 mmol/L Final   CO2 05/07/2022 23  22 - 32 mmol/L Final   Glucose, Bld 05/07/2022 132 (H)  70 - 99 mg/dL Final   Glucose reference range applies only to samples taken after fasting for at least 8 hours.   BUN 05/07/2022 21  8 - 23 mg/dL Final   Creatinine, Ser 05/07/2022 1.33 (H)  0.61 - 1.24 mg/dL Final   Calcium 05/07/2022 9.3  8.9 - 10.3 mg/dL Final   Total Protein 05/07/2022 7.7  6.5 - 8.1 g/dL Final   Albumin 05/07/2022 4.1  3.5 - 5.0 g/dL Final   AST 05/07/2022 22  15 - 41 U/L Final   ALT 05/07/2022 25  0 - 44 U/L Final   Alkaline Phosphatase  05/07/2022 57  38 - 126 U/L Final   Total Bilirubin 05/07/2022 0.9  0.3 - 1.2 mg/dL Final   GFR, Estimated 05/07/2022 59 (L)  >60 mL/min Final   Comment: (NOTE) Calculated using the CKD-EPI Creatinine Equation (2021)    Anion gap 05/07/2022 9  5 - 15 Final   Performed at [facility], [place], [place] [address]   WBC 05/07/2022 10.1  4.0 - 10.5 K/uL Final   RBC 05/07/2022 5.48  4.22 - 5.81 MIL/uL Final   Hemoglobin 05/07/2022 17.0  13.0 - 17.0 g/dL Final   HCT 05/07/2022 51.1  39.0 - 52.0 % Final   MCV 05/07/2022 93.2  80.0 - 100.0 fL Final   MCH 05/07/2022 31.0  26.0 - 34.0 pg Final   MCHC 05/07/2022 33.3  30.0 - 36.0 g/dL Final   RDW 05/07/2022 13.6  11.5 - 15.5 % Final   Platelets 05/07/2022 292  150 - 400 K/uL Final   nRBC 05/07/2022 0.0  0.0 - 0.2 % Final   Performed at [facility], [address] [place], [place] [address]    ECG: Date: 11/11/2021 Time ECG obtained: 0834 AM Rate: 70 bpm Rhythm: normal sinus Axis (leads I and aVF): Normal Intervals: PR 194 ms. QRS 90 ms. QTc 423 ms. ST segment and T wave changes: No evidence of acute ST segment elevation or depression.  Evidence of an age undetermined inferior infarct  present. Comparison: Similar to previous tracing obtained on 03/10/2021   IMAGING / PROCEDURES: CT ABDOMEN PELVIS W CONTRAST performed on 05/07/2022 Small left inguinal hernia containing small portion of the small bowel without evidence of obstruction or inflammatory changes in the hernial sac. Enlarged prostate measuring 5.5 x 4.2 cm, the prostate protrudes into the urinary bladder. Correlate with PSA and prostate MRI examination could be considered for further evaluation, if clinically warranted. Bowel loops are normal in caliber. Normal appendix. 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 Degenerate disc disease with osteophytes at L3-L4 and L5-S1 with bulky osteophytes. Prominent facet joint arthropathy at L5-S1. No acute osseous abnormality.   TRANSTHORACIC ECHOCARDIOGRAM performed on 12/09/2021 Left ventricular ejection fraction, by estimation, is 45 to 50%. The left ventricle has mildly decreased function. The left ventricle  demonstrates mild global hypokinesis with moderate to severe hypokinesis  of the inferior and basal to mid septal  wall. Left ventricular diastolic parameters are consistent with Grade I  diastolic dysfunction (impaired relaxation). The average left ventricular  global longitudinal strain is -15.3 %.  Right ventricular systolic function is normal. The right ventricular size is normal. Tricuspid regurgitation signal is inadequate for assessing PA pressure.  The mitral valve is normal in structure. Mild mitral valve regurgitation. No evidence of mitral stenosis.  The aortic valve is tricuspid. Aortic valve regurgitation is not visualized. No aortic stenosis is present.  The inferior vena cava is normal in size with greater than 50% respiratory variability, suggesting right atrial pressure of 3 mmHg.   EXERCISE TOLERANCE TEST performed on 10/14/2019 No ST segment deviation noted during stress No T wave  inversion noted during stress Very few PVCs in recovery Average exercise capacity with exercise time of 6 minutes 49 seconds achieving 87% MPHR and 8.2 METS Normal blood pressure at baseline with mild hypertensive response to exercise  Normal treadmill stress test with no evidence of ischemia  LEFT HEART CATHETERIZATION AND CORONARY ANGIOGRAPHY performed on 07/21/2017 Severely reduced left ventricular systolic function  with an EF of 25-35% Mildly elevated LVEDP = 14 mmHg Inferior wall akinesis as well as hypokinesis of the basal anterior wall Multivessel CAD 40% proximal LCx 40% mid LCx 100% distal LCx (chronic with collaterals) 100% proximal RCA (chronic with well-developed left-to-right collaterals) Unsuccessful coronary angioplasty of the RCA due to inability to cross with 2 wires    Impression and Plan:  Lawrence Holmes has been referred for pre-anesthesia review and clearance prior to him undergoing the planned anesthetic and procedural courses. Available labs, pertinent testing, and imaging results were personally reviewed by me. This patient has been appropriately cleared by cardiology with an overall ACCEPTABLE risk of significant perioperative cardiovascular complications.  Based on clinical review performed today (05/18/22), barring any significant acute changes in the patient's overall condition, it is anticipated that he will be able to proceed with the planned surgical intervention. Any acute changes in clinical condition may necessitate his procedure being postponed and/or cancelled. Patient will meet with anesthesia team (MD and/or CRNA) on the day of his procedure for preoperative evaluation/assessment. Questions regarding anesthetic course will be fielded at that time.   Pre-surgical instructions were reviewed with the patient during his PAT appointment and questions were fielded by PAT clinical staff. Patient was advised that if any questions or concerns arise prior to his  procedure then he should return a call to PAT and/or his surgeon's office to discuss.  Honor Loh, MSN, APRN, FNP-C, CEN [facility]  Peri-operative Services Nurse Practitioner Phone: [phone] Fax: [phone] 05/18/22 1:29 PM  NOTE: This note has been prepared using Dragon dictation software. Despite my best ability to proofread, there is always the potential that unintentional transcriptional errors may still occur from this process.

## 2022-05-18 MED ORDER — CELECOXIB 200 MG PO CAPS: 200.0000 mg | ORAL_CAPSULE | ORAL | Status: AC

## 2022-05-18 MED ORDER — SODIUM CHLORIDE 0.9 % IV SOLN: INTRAVENOUS | Status: DC

## 2022-05-18 MED ORDER — ORAL CARE MOUTH RINSE: 15.0000 mL | Freq: Once | OROMUCOSAL | Status: AC

## 2022-05-18 MED ORDER — CHLORHEXIDINE GLUCONATE CLOTH 2 % EX PADS: 6.0000 | MEDICATED_PAD | Freq: Once | CUTANEOUS | Status: DC

## 2022-05-18 MED ORDER — CEFAZOLIN SODIUM-DEXTROSE 2-4 GM/100ML-% IV SOLN
2.0000 g | INTRAVENOUS | Status: AC
  Administered 2022-05-19: 2 g via INTRAVENOUS

## 2022-05-18 MED ORDER — FAMOTIDINE 20 MG PO TABS: 20.0000 mg | ORAL_TABLET | Freq: Once | ORAL | Status: AC

## 2022-05-18 MED ORDER — ACETAMINOPHEN 500 MG PO TABS: 1000.0000 mg | ORAL_TABLET | ORAL | Status: AC

## 2022-05-18 MED ORDER — BUPIVACAINE LIPOSOME 1.3 % IJ SUSP: 20.0000 mL | Freq: Once | INTRAMUSCULAR | Status: DC

## 2022-05-18 MED ORDER — GABAPENTIN 300 MG PO CAPS: 300.0000 mg | ORAL_CAPSULE | ORAL | Status: AC

## 2022-05-18 MED ORDER — CHLORHEXIDINE GLUCONATE 0.12 % MT SOLN
15.0000 mL | Freq: Once | OROMUCOSAL | Status: AC
  Administered 2022-05-19: 15 mL via OROMUCOSAL

## 2022-05-19 ENCOUNTER — Ambulatory Visit: Payer: 59 | Admitting: Urgent Care

## 2022-05-19 ENCOUNTER — Other Ambulatory Visit: Payer: Self-pay

## 2022-05-19 ENCOUNTER — Encounter
Admission: RE | Disposition: A | Discharge status: Home / Self Care | Payer: Self-pay | Source: Home / Self Care | Attending: Surgery

## 2022-05-19 ENCOUNTER — Ambulatory Visit
Admission: RE | Admit: 2022-05-19 | Discharge: 2022-05-19 | Disposition: A | Discharge status: Home / Self Care | Payer: 59 | Attending: Surgery | Admitting: Surgery

## 2022-05-19 ENCOUNTER — Encounter: Payer: Self-pay | Admitting: Surgery

## 2022-05-19 DIAGNOSIS — Z8349 Family history of other endocrine, nutritional and metabolic diseases: Secondary | ICD-10-CM | POA: Insufficient documentation

## 2022-05-19 DIAGNOSIS — E119 Type 2 diabetes mellitus without complications: Secondary | ICD-10-CM | POA: Insufficient documentation

## 2022-05-19 DIAGNOSIS — Z8249 Family history of ischemic heart disease and other diseases of the circulatory system: Secondary | ICD-10-CM | POA: Insufficient documentation

## 2022-05-19 DIAGNOSIS — F17210 Nicotine dependence, cigarettes, uncomplicated: Secondary | ICD-10-CM | POA: Diagnosis not present

## 2022-05-19 DIAGNOSIS — I5022 Chronic systolic (congestive) heart failure: Secondary | ICD-10-CM | POA: Diagnosis not present

## 2022-05-19 DIAGNOSIS — M1990 Unspecified osteoarthritis, unspecified site: Secondary | ICD-10-CM | POA: Diagnosis not present

## 2022-05-19 DIAGNOSIS — Z7984 Long term (current) use of oral hypoglycemic drugs: Secondary | ICD-10-CM | POA: Insufficient documentation

## 2022-05-19 DIAGNOSIS — I110 Hypertensive heart disease with heart failure: Secondary | ICD-10-CM | POA: Insufficient documentation

## 2022-05-19 DIAGNOSIS — I700 Atherosclerosis of aorta: Secondary | ICD-10-CM | POA: Insufficient documentation

## 2022-05-19 DIAGNOSIS — I252 Old myocardial infarction: Secondary | ICD-10-CM | POA: Diagnosis not present

## 2022-05-19 DIAGNOSIS — I2510 Atherosclerotic heart disease of native coronary artery without angina pectoris: Secondary | ICD-10-CM | POA: Diagnosis not present

## 2022-05-19 DIAGNOSIS — Z79899 Other long term (current) drug therapy: Secondary | ICD-10-CM | POA: Diagnosis not present

## 2022-05-19 DIAGNOSIS — E785 Hyperlipidemia, unspecified: Secondary | ICD-10-CM | POA: Diagnosis not present

## 2022-05-19 DIAGNOSIS — K4090 Unilateral inguinal hernia, without obstruction or gangrene, not specified as recurrent: Secondary | ICD-10-CM | POA: Diagnosis not present

## 2022-05-19 DIAGNOSIS — I255 Ischemic cardiomyopathy: Secondary | ICD-10-CM | POA: Insufficient documentation

## 2022-05-19 DIAGNOSIS — Z01818 Encounter for other preprocedural examination: Secondary | ICD-10-CM

## 2022-05-19 HISTORY — PX: XI ROBOTIC ASSISTED INGUINAL HERNIA REPAIR WITH MESH: SHX6706

## 2022-05-19 HISTORY — DX: Type 2 diabetes mellitus without complications: E11.9

## 2022-05-19 HISTORY — DX: Cataract extraction status, right eye: Z98.42

## 2022-05-19 HISTORY — DX: Benign prostatic hyperplasia without lower urinary tract symptoms: N40.0

## 2022-05-19 HISTORY — DX: Cataract extraction status, left eye: Z98.42

## 2022-05-19 HISTORY — DX: Other intervertebral disc degeneration, lumbar region without mention of lumbar back pain or lower extremity pain: M51.369

## 2022-05-19 HISTORY — DX: Cataract extraction status, right eye: Z98.41

## 2022-05-19 HISTORY — DX: Essential (primary) hypertension: I10

## 2022-05-19 HISTORY — DX: Male erectile dysfunction, unspecified: N52.9

## 2022-05-19 HISTORY — DX: Other intervertebral disc degeneration, lumbar region: M51.36

## 2022-05-19 LAB — GLUCOSE, CAPILLARY
Glucose-Capillary: 95 mg/dL (ref 70–99)
Glucose-Capillary: 124 mg/dL — ABNORMAL HIGH (ref 70–99)

## 2022-05-19 SURGERY — REPAIR, HERNIA, INGUINAL, ROBOT-ASSISTED, LAPAROSCOPIC, USING MESH
Anesthesia: General | Site: Inguinal | Laterality: Left

## 2022-05-19 MED ORDER — LIDOCAINE HCL (CARDIAC) PF 100 MG/5ML IV SOSY
PREFILLED_SYRINGE | INTRAVENOUS | Status: DC | PRN
  Administered 2022-05-19: 100 mg via INTRAVENOUS

## 2022-05-19 MED ORDER — ONDANSETRON HCL 4 MG/2ML IJ SOLN
INTRAMUSCULAR | Status: DC | PRN
  Administered 2022-05-19 (×2): 4 mg via INTRAVENOUS

## 2022-05-19 MED ORDER — ACETAMINOPHEN 10 MG/ML IV SOLN: 1000.0000 mg | Freq: Once | INTRAVENOUS | Status: DC | PRN

## 2022-05-19 MED ORDER — HYDROMORPHONE HCL 1 MG/ML IJ SOLN
INTRAMUSCULAR | Status: DC | PRN
  Administered 2022-05-19: 1 mg via INTRAVENOUS

## 2022-05-19 MED ORDER — DROPERIDOL 2.5 MG/ML IJ SOLN: 0.6250 mg | Freq: Once | INTRAMUSCULAR | Status: DC | PRN

## 2022-05-19 MED ORDER — PROMETHAZINE HCL 25 MG/ML IJ SOLN: 6.2500 mg | INTRAMUSCULAR | Status: DC | PRN

## 2022-05-19 MED ORDER — OXYCODONE HCL 5 MG PO TABS
5.0000 mg | ORAL_TABLET | Freq: Once | ORAL | Status: AC | PRN
  Administered 2022-05-19: 5 mg via ORAL

## 2022-05-19 MED ORDER — OXYCODONE HCL 5 MG/5ML PO SOLN: 5.0000 mg | Freq: Once | ORAL | Status: AC | PRN

## 2022-05-19 MED ORDER — FENTANYL CITRATE (PF) 100 MCG/2ML IJ SOLN
INTRAMUSCULAR | Status: AC
  Filled 2022-05-19: qty 2

## 2022-05-19 MED ORDER — PHENYLEPHRINE 80 MCG/ML (10ML) SYRINGE FOR IV PUSH (FOR BLOOD PRESSURE SUPPORT)
PREFILLED_SYRINGE | INTRAVENOUS | Status: DC | PRN
  Administered 2022-05-19 (×3): 80 ug via INTRAVENOUS

## 2022-05-19 MED ORDER — GLYCOPYRROLATE 0.2 MG/ML IJ SOLN
INTRAMUSCULAR | Status: DC | PRN
  Administered 2022-05-19: .2 mg via INTRAVENOUS

## 2022-05-19 MED ORDER — DEXAMETHASONE SODIUM PHOSPHATE 10 MG/ML IJ SOLN
INTRAMUSCULAR | Status: DC | PRN
  Administered 2022-05-19: 5 mg via INTRAVENOUS

## 2022-05-19 MED ORDER — BUPIVACAINE LIPOSOME 1.3 % IJ SUSP
INTRAMUSCULAR | Status: AC
  Filled 2022-05-19: qty 20

## 2022-05-19 MED ORDER — GABAPENTIN 300 MG PO CAPS
ORAL_CAPSULE | ORAL | Status: AC
  Administered 2022-05-19: 300 mg via ORAL
  Filled 2022-05-19: qty 1

## 2022-05-19 MED ORDER — FENTANYL CITRATE (PF) 100 MCG/2ML IJ SOLN: 25.0000 ug | INTRAMUSCULAR | Status: DC | PRN

## 2022-05-19 MED ORDER — HYDROCODONE-ACETAMINOPHEN 5-325 MG PO TABS: 1.0000 | ORAL_TABLET | Freq: Four times a day (QID) | ORAL | 0 refills | Status: DC | PRN

## 2022-05-19 MED ORDER — ROCURONIUM BROMIDE 100 MG/10ML IV SOLN
INTRAVENOUS | Status: DC | PRN
  Administered 2022-05-19: 50 mg via INTRAVENOUS

## 2022-05-19 MED ORDER — IBUPROFEN 800 MG PO TABS: 800.0000 mg | ORAL_TABLET | Freq: Three times a day (TID) | ORAL | 0 refills | Status: DC | PRN

## 2022-05-19 MED ORDER — EPHEDRINE SULFATE (PRESSORS) 50 MG/ML IJ SOLN
INTRAMUSCULAR | Status: DC | PRN
  Administered 2022-05-19 (×2): 5 mg via INTRAVENOUS

## 2022-05-19 MED ORDER — BUPIVACAINE-EPINEPHRINE (PF) 0.25% -1:200000 IJ SOLN
INTRAMUSCULAR | Status: AC
  Filled 2022-05-19: qty 30

## 2022-05-19 MED ORDER — ACETAMINOPHEN 500 MG PO TABS
ORAL_TABLET | ORAL | Status: AC
  Administered 2022-05-19: 1000 mg via ORAL
  Filled 2022-05-19: qty 2

## 2022-05-19 MED ORDER — PROPOFOL 10 MG/ML IV BOLUS
INTRAVENOUS | Status: DC | PRN
  Administered 2022-05-19: 200 mg via INTRAVENOUS

## 2022-05-19 MED ORDER — CELECOXIB 200 MG PO CAPS
ORAL_CAPSULE | ORAL | Status: AC
  Administered 2022-05-19: 200 mg via ORAL
  Filled 2022-05-19: qty 1

## 2022-05-19 MED ORDER — BUPIVACAINE-EPINEPHRINE (PF) 0.25% -1:200000 IJ SOLN
INTRAMUSCULAR | Status: DC | PRN
  Administered 2022-05-19: 30 mL

## 2022-05-19 MED ORDER — HYDROMORPHONE HCL 1 MG/ML IJ SOLN
INTRAMUSCULAR | Status: AC
  Filled 2022-05-19: qty 1

## 2022-05-19 MED ORDER — CEFAZOLIN SODIUM-DEXTROSE 2-4 GM/100ML-% IV SOLN
INTRAVENOUS | Status: AC
  Filled 2022-05-19: qty 100

## 2022-05-19 MED ORDER — OXYCODONE HCL 5 MG PO TABS
ORAL_TABLET | ORAL | Status: AC
  Filled 2022-05-19: qty 1

## 2022-05-19 MED ORDER — FENTANYL CITRATE (PF) 100 MCG/2ML IJ SOLN
INTRAMUSCULAR | Status: DC | PRN
  Administered 2022-05-19 (×2): 50 ug via INTRAVENOUS

## 2022-05-19 MED ORDER — MIDAZOLAM HCL 2 MG/2ML IJ SOLN
INTRAMUSCULAR | Status: DC | PRN
  Administered 2022-05-19: 2 mg via INTRAVENOUS

## 2022-05-19 MED ORDER — MIDAZOLAM HCL 2 MG/2ML IJ SOLN
INTRAMUSCULAR | Status: AC
  Filled 2022-05-19: qty 2

## 2022-05-19 MED ORDER — FAMOTIDINE 20 MG PO TABS
ORAL_TABLET | ORAL | Status: AC
  Administered 2022-05-19: 20 mg via ORAL
  Filled 2022-05-19: qty 1

## 2022-05-19 SURGICAL SUPPLY — 44 items
ADH SKN CLS APL DERMABOND .7 (GAUZE/BANDAGES/DRESSINGS) ×2
BLADE CLIPPER SURG (BLADE) ×2 IMPLANT
COVER TIP SHEARS 8 DVNC (MISCELLANEOUS) ×2 IMPLANT
COVER TIP SHEARS 8MM DA VINCI (MISCELLANEOUS) ×2
COVER WAND RF STERILE (DRAPES) ×2 IMPLANT
DERMABOND ADVANCED .7 DNX12 (GAUZE/BANDAGES/DRESSINGS) ×2 IMPLANT
DRAPE ARM DVNC X/XI (DISPOSABLE) ×6 IMPLANT
DRAPE COLUMN DVNC XI (DISPOSABLE) ×2 IMPLANT
DRAPE DA VINCI XI ARM (DISPOSABLE) ×6
DRAPE DA VINCI XI COLUMN (DISPOSABLE) ×2
ELECT REM PT RETURN 9FT ADLT (ELECTROSURGICAL) ×2
ELECTRODE REM PT RTRN 9FT ADLT (ELECTROSURGICAL) ×2 IMPLANT
GLOVE ORTHO TXT STRL SZ7.5 (GLOVE) ×5 IMPLANT
GOWN STRL REUS W/ TWL LRG LVL3 (GOWN DISPOSABLE) ×6 IMPLANT
GOWN STRL REUS W/ TWL XL LVL3 (GOWN DISPOSABLE) ×4 IMPLANT
GOWN STRL REUS W/TWL LRG LVL3 (GOWN DISPOSABLE) ×10
GOWN STRL REUS W/TWL XL LVL3 (GOWN DISPOSABLE) ×4
GRASPER SUT TROCAR 14GX15 (MISCELLANEOUS) IMPLANT
IRRIGATION STRYKERFLOW (MISCELLANEOUS) IMPLANT
IRRIGATOR STRYKERFLOW (MISCELLANEOUS)
IV NS 1000ML (IV SOLUTION)
IV NS 1000ML BAXH (IV SOLUTION) IMPLANT
KIT PINK PAD W/HEAD ARE REST (MISCELLANEOUS) ×2
KIT PINK PAD W/HEAD ARM REST (MISCELLANEOUS) ×2 IMPLANT
LABEL OR SOLS (LABEL) ×2 IMPLANT
MANIFOLD NEPTUNE II (INSTRUMENTS) ×1 IMPLANT
MESH 3DMAX LIGHT 4.8X6.7 LT XL (Mesh General) ×1 IMPLANT
NDL INSUFFLATION 14GA 120MM (NEEDLE) IMPLANT
NEEDLE HYPO 22GX1.5 SAFETY (NEEDLE) ×2 IMPLANT
NEEDLE INSUFFLATION 14GA 120MM (NEEDLE) ×2 IMPLANT
PACK LAP CHOLECYSTECTOMY (MISCELLANEOUS) ×2 IMPLANT
SEAL CANN UNIV 5-8 DVNC XI (MISCELLANEOUS) ×6 IMPLANT
SEAL XI 5MM-8MM UNIVERSAL (MISCELLANEOUS) ×6
SET TUBE SMOKE EVAC HIGH FLOW (TUBING) ×2 IMPLANT
SOLUTION ELECTROLUBE (MISCELLANEOUS) ×2 IMPLANT
SUT MNCRL 4-0 (SUTURE) ×4
SUT MNCRL 4-0 27XMFL (SUTURE) ×4
SUT V-LOC 90 ABS 3-0 VLT  V-20 (SUTURE)
SUT V-LOC 90 ABS 3-0 VLT V-20 (SUTURE) IMPLANT
SUT VIC AB 0 CT2 27 (SUTURE) ×2 IMPLANT
SUT VIC AB 2-0 RB1 27 (SUTURE) ×2 IMPLANT
SUT VLOC 90 2/L VL 12 GS22 (SUTURE) ×1 IMPLANT
SUT VLOC 90 S/L VL9 GS22 (SUTURE) IMPLANT
SUTURE MNCRL 4-0 27XMF (SUTURE) ×3 IMPLANT

## 2022-05-19 NOTE — Op Note (Signed)
Robotic assisted Laparoscopic Transabdominal left inguinal Hernia Repair with Mesh       Pre-operative Diagnosis: Left inguinal Hernia   Post-operative Diagnosis: Same   Procedure: Robotic assisted Laparoscopic  repair of left inguinal hernia(s)   Surgeon: Ronny Bacon, M.D., FACS   Anesthesia: GETA   Findings: Left inguinal hernia, no evidence of right sided hernia.         Procedure Details  The patient was seen again in the Holding Room. The benefits, complications, treatment options, and expected outcomes were discussed with the patient. The risks of bleeding, infection, recurrence of symptoms, failure to resolve symptoms, recurrence of hernia, ischemic orchitis, chronic pain syndrome or neuroma, were reviewed again. The likelihood of improving the patient's symptoms with return to their baseline status is good.  The patient and/or family concurred with the proposed plan, giving informed consent.  The patient was taken to Operating Room, identified  and the procedure verified as Laparoscopic Inguinal Hernia Repair. Laterality confirmed.  A Time Out was held and the above information confirmed.   Prior to the induction of general anesthesia, antibiotic prophylaxis was administered. VTE prophylaxis was in place. General endotracheal anesthesia was then administered and tolerated well. After the induction, the abdomen was prepped with Chloraprep and draped in the sterile fashion. The patient was positioned in the supine position.   After local infiltration of quarter percent Marcaine with epinephrine, stab incision was made left upper quadrant.  On the left at Palmer's point, the Veress needle is passed with sensation of the layers to penetrate the abdominal wall and into the peritoneum.  Saline drop test is confirmed peritoneal placement.  Insufflation is initiated with carbon dioxide to pressures of 15 mmHg. An 8.5 mm port is placed to the left off of the midline, with blunt tipped  trocar.  Pneumoperitoneum maintained w/o HD changes using the AirSeal to pressures of 15 mm Hg with CO2. No evidence of bowel injuries.  Two 8.5 mm ports placed under direct vision in each upper quadrant. The laparoscopy revealed left direct defect(s).   The robot was brought ot the table and docked in the standard fashion, no collision between arms was observed. Instruments were kept under direct view at all times. For left inguinal hernia repair,  I developed a peritoneal flap. The sac(s) were reduced and dissected free from adjacent structures. We preserved the vas and the vessels, and visualized them to their convergence and beyond in the retroperitoneum. Once dissection was completed a large extra-large sided BARD 3D Light mesh was placed and secured at 4 points with interrupted 0 Vicryl to the pubic tubercle and anteriorly. There was good coverage of the direct, indirect and femoral spaces.  Second look revealed no complications or injuries.  The flap was then closed with 2-0 V-lock suture.  Peritoneal closure without defects.  Once assuring that hemostasis was adequate, all needles/sponges removed, and the robot was undocked.  Under direct visualization I placed the Veress needle into the preperitoneal space the Veress' valve was released allowing extraperitoneal CO2 to escape, it was also used to access the space for supplemental local anesthesia. The ports were removed, the abdomen desulflated.  4-0 subcuticular Monocryl was used at all skin edges. Dermabond was placed.  Patient tolerated the procedure well. There were no complications. He was taken to the recovery room in stable condition.           Ronny Bacon, M.D., FACS 05/19/2022, 1:56 PM

## 2022-05-19 NOTE — Transfer of Care (Signed)
Immediate Anesthesia Transfer of Care Note  Patient: Lawrence Holmes [place]  Procedure(s) Performed: XI ROBOTIC ASSISTED INGUINAL HERNIA REPAIR WITH MESH (Left: Inguinal)  Patient Location: PACU  Anesthesia Type:General  Level of Consciousness: drowsy  Airway & Oxygen Therapy: Patient Spontanous Breathing and Patient connected to face mask oxygen  Post-op Assessment: Report given to RN and Post -op Vital signs reviewed and stable  Post vital signs: Reviewed and stable  Last Vitals:  Vitals Value Taken Time  BP 141/53 05/19/22 1347  Temp 36 C 05/19/22 1347  Pulse 76 05/19/22 1352  Resp 16 05/19/22 1352  SpO2 90 % 05/19/22 1352  Vitals shown include unvalidated device data.  Last Pain:  Vitals:   05/19/22 1347  TempSrc:   PainSc: Asleep         Complications: No notable events documented.

## 2022-05-19 NOTE — Anesthesia Procedure Notes (Signed)
Procedure Name: Intubation Date/Time: 05/19/2022 12:17 PM  Performed by: Kelton Pillar, CRNAPre-anesthesia Checklist: Patient identified, Emergency Drugs available, Suction available and Patient being monitored Patient Re-evaluated:Patient Re-evaluated prior to induction Oxygen Delivery Method: Circle system utilized Preoxygenation: Pre-oxygenation with 100% oxygen Induction Type: IV induction Ventilation: Mask ventilation without difficulty Laryngoscope Size: McGraph and 3 Grade View: Grade I Tube type: Oral Tube size: 7.0 mm Number of attempts: 1 Airway Equipment and Method: Stylet and Oral airway Placement Confirmation: ETT inserted through vocal cords under direct vision, positive ETCO2, breath sounds checked- equal and bilateral and CO2 detector Secured at: 21 cm Tube secured with: Tape Dental Injury: Teeth and Oropharynx as per pre-operative assessment

## 2022-05-19 NOTE — Anesthesia Preprocedure Evaluation (Addendum)
Anesthesia Evaluation  Patient identified by MRN, date of birth, ID band Patient awake    Reviewed: Allergy & Precautions, NPO status , Patient's Chart, lab work & pertinent test results, reviewed documented beta blocker date and time   Airway Mallampati: III  TM Distance: >3 FB Neck ROM: full    Dental  (+) Chipped   Pulmonary Current Smoker and Patient abstained from smoking.,    Pulmonary exam normal        Cardiovascular Exercise Tolerance: Good hypertension, Pt. on medications + CAD, + Past MI and +CHF  Normal cardiovascular exam     Neuro/Psych negative neurological ROS  negative psych ROS   GI/Hepatic Neg liver ROS, left inguinal hernia   Endo/Other  diabetes, Type 2  Renal/GU      Musculoskeletal  (+) Arthritis ,   Abdominal   Peds  Hematology negative hematology ROS (+)   Anesthesia Other Findings Past Medical History: No date: Aortic atherosclerosis ([place]) No date: Arthritis 26/83/4196: Chronic systolic heart failure ([place])     Comment:  a.) TTE 07/23/2017: EF 30-35%, mild LVH, inferolateral               and inferior AK, mild BAE, G1DD; b.) TTE 10/23/2017: EF               40-45%, mild LVH, basal-mid inferior and inferoseptal AK,              mild MR; c.) TTE 11/19/2021: EF 45-50%, glob HK with mod               to severe inferior and basal to mid septal HK, GLS               -15.3%, mild MR, G1DD. 07/21/2017: Coronary artery disease     Comment:  a. 07/2017 Late presenting inferior STEMI/Cath: LM nl,               LAD min irregs, LCX 100d (L->L collats), RCA 100p (L->R               collats), EF 25-35%-->Med Rx; b. 10/2017 ETT (DOT): Ex               time 7:38, baseline antlat TWI, no acute changes.  No date: DDD (degenerative disc disease), lumbar No date: Diverticulosis of colon No date: Enlarged prostate No date: Erectile dysfunction     Comment:  a.) on PDE5i (sildenafil) PRN No date: HTN  (hypertension) No date: Hx of bilateral cataract extraction No date: Hyperlipidemia No date: Ischemic cardiomyopathy     Comment:  a.) LHC 07/21/2017: EF 25-35%; b.) TTE 07/23/2017:               30-35%; c.) TTE 10/23/2017: EF 40-45%; d.) TTE               11/19/2021: EF 45-50% No date: Left inguinal hernia 07/21/2017: ST elevation myocardial infarction (STEMI) of inferior  wall ([place])     Comment:  a.) LHC 07/21/2017: EF 25-35%, LVEDP 14 mmHg. 40% pLCx,               40% mLCx, 100% dLCx, 100% pRCA --> attempted to cross               wire at RCA lesion, however unable. Reasonable RCA               collaterals already formed --> med mgmt. No date: T2DM (type 2 diabetes mellitus) ([place]) No date:  Tobacco use  Past Surgical History: No date: CATARACT EXTRACTION; Bilateral 04/04/2019: COLONOSCOPY WITH PROPOFOL; N/A     Comment:  Procedure: COLONOSCOPY WITH PROPOFOL;  Surgeon:               Virgel Manifold, MD;  Location: [facility];                Service: Endoscopy;  Laterality: N/A; 07/21/2017: LEFT HEART CATH AND CORONARY ANGIOGRAPHY; N/A     Comment:  Procedure: LEFT HEART CATH AND CORONARY ANGIOGRAPHY;                Surgeon: Wellington Hampshire, MD;  Location: [place]               CV [facility];  Service: Cardiovascular;  Laterality: N/A;     Reproductive/Obstetrics negative OB ROS                            Anesthesia Physical Anesthesia Plan  ASA: 3  Anesthesia Plan: General   Post-op Pain Management: Toradol IV (intra-op)* and Ofirmev IV (intra-op)*   Induction: Intravenous  PONV Risk Score and Plan: 3 and Dexamethasone, Ondansetron and Midazolam  Airway Management Planned: Oral ETT  Additional Equipment:   Intra-op Plan:   Post-operative Plan:   Informed Consent: I have reviewed the patients History and Physical, chart, labs and discussed the procedure including the risks, benefits and alternatives for the proposed anesthesia  with the patient or authorized representative who has indicated his/her understanding and acceptance.     Dental Advisory Given  Plan Discussed with: Anesthesiologist, CRNA and Surgeon  Anesthesia Plan Comments:        Anesthesia Quick Evaluation

## 2022-05-19 NOTE — Interval H&P Note (Signed)
History and Physical Interval Note:  05/19/2022 11:48 AM  Lawrence Holmes  has presented today for surgery, with the diagnosis of left inguinal hernia.  The various methods of treatment have been discussed with the patient and family. After consideration of risks, benefits and other options for treatment, the patient has consented to  Procedure(s): XI ROBOTIC [place] (Left) as a surgical intervention.  The patient's history has been reviewed, patient examined, no change in status, stable for surgery.  I have reviewed the patient's chart and labs.  Questions were answered to the patient's satisfaction.   The left side is marked.   Ronny Bacon

## 2022-05-19 NOTE — Anesthesia Postprocedure Evaluation (Signed)
Anesthesia Post Note  Patient: Lawrence Holmes  Procedure(s) Performed: XI ROBOTIC ASSISTED INGUINAL HERNIA REPAIR WITH MESH (Left: Inguinal)  Patient location during evaluation: PACU Anesthesia Type: General Level of consciousness: awake and alert Pain management: pain level controlled Vital Signs Assessment: post-procedure vital signs reviewed and stable Respiratory status: spontaneous breathing, nonlabored ventilation and respiratory function stable Cardiovascular status: blood pressure returned to baseline and stable Postop Assessment: no apparent nausea or vomiting Anesthetic complications: no   No notable events documented.   Last Vitals:  Vitals:   05/19/22 1430 05/19/22 1445  BP: 111/76 116/74  Pulse: 77 81  Resp: 14 15  Temp:  (!) 36.1 C  SpO2: 91% 92%    Last Pain:  Vitals:   05/19/22 1445  TempSrc:   PainSc: [place]

## 2022-05-19 NOTE — Discharge Instructions (Signed)

## 2022-05-20 ENCOUNTER — Encounter: Payer: Self-pay | Admitting: Surgery

## 2022-05-26 ENCOUNTER — Encounter: Payer: Self-pay | Admitting: Family Medicine

## 2022-05-26 ENCOUNTER — Ambulatory Visit (INDEPENDENT_AMBULATORY_CARE_PROVIDER_SITE_OTHER): Payer: 59 | Admitting: Family Medicine

## 2022-05-26 VITALS — BP 128/70 | HR 98 | Temp 98.3°F | Resp 16 | Ht 72.0 in | Wt 210.5 lb

## 2022-05-26 DIAGNOSIS — E1169 Type 2 diabetes mellitus with other specified complication: Secondary | ICD-10-CM

## 2022-05-26 DIAGNOSIS — E785 Hyperlipidemia, unspecified: Secondary | ICD-10-CM | POA: Diagnosis not present

## 2022-05-26 DIAGNOSIS — I700 Atherosclerosis of aorta: Secondary | ICD-10-CM | POA: Diagnosis not present

## 2022-05-26 DIAGNOSIS — E119 Type 2 diabetes mellitus without complications: Secondary | ICD-10-CM

## 2022-05-26 DIAGNOSIS — Z23 Encounter for immunization: Secondary | ICD-10-CM | POA: Diagnosis not present

## 2022-05-26 DIAGNOSIS — E1165 Type 2 diabetes mellitus with hyperglycemia: Secondary | ICD-10-CM

## 2022-05-26 DIAGNOSIS — I255 Ischemic cardiomyopathy: Secondary | ICD-10-CM

## 2022-05-26 DIAGNOSIS — I5042 Chronic combined systolic (congestive) and diastolic (congestive) heart failure: Secondary | ICD-10-CM

## 2022-05-26 DIAGNOSIS — I25118 Atherosclerotic heart disease of native coronary artery with other forms of angina pectoris: Secondary | ICD-10-CM

## 2022-05-26 DIAGNOSIS — B351 Tinea unguium: Secondary | ICD-10-CM

## 2022-05-26 NOTE — Patient Instructions (Addendum)
Dr. Cleda Mccreedy - call to get a follow up visit - you should be an established patient, let me know if for some reason insurance needs a new referral  [place]   [place], [place] [address]   Phone: [phone]   Fax: [phone]

## 2022-05-26 NOTE — Progress Notes (Signed)
Name: Lawrence Holmes   MRN: [id number]    DOB: Feb 16, 1956   Date:05/26/2022       Progress Note  Chief Complaint  Patient presents with   Follow-up   Diabetes   Hyperlipidemia     Subjective:   Lawrence Holmes is a 66 y.o. male, presents to clinic for routine f/up  DM on jardiance 10 mg daily, metformin 500 mg BID Was elevated to 9 and then last labs was improved and well controlled, here for recheck   HTN/CHF managed by cardiology on spironolactone, entresto, carvedilol  BP Readings from Last 3 Encounters:  05/26/22 128/70  05/19/22 (P) 106/69  05/09/22 132/72   HDL on atorvastatin 80 mg daily, has been well controlled in the past, no new SE or concerns     Current Outpatient Medications:    ACCU-CHEK GUIDE test strip, SMARTSIG:Via Meter 1-4 Times Daily, Disp: , Rfl:    Accu-Chek Softclix Lancets lancets, SMARTSIG:Via Meter 1-4 Times Daily, Disp: , Rfl:    aspirin 81 MG chewable tablet, Chew 1 tablet (81 mg total) by mouth daily., Disp: 30 tablet, Rfl: 2   atorvastatin (LIPITOR) 80 MG tablet, Take 1 tablet (80 mg total) by mouth at bedtime., Disp: 90 tablet, Rfl: 2   Blood Gluc Meter Disp-Strips (BLOOD GLUCOSE METER DISPOSABLE) DEVI, Check fingerstick blood sugars once a day; E11.69, LON 99 months, Disp: 100 each, Rfl: 1   blood glucose meter kit and supplies KIT, Dispense based on patient and insurance preference. Use up to four times daily as directed. (FOR ICD-9 250.00, 250.01)., Disp: 1 each, Rfl: 0   carvedilol (COREG) 3.125 MG tablet, Take 1 tablet (3.125 mg total) by mouth 2 (two) times daily with a meal., Disp: 180 tablet, Rfl: 2   empagliflozin (JARDIANCE) 10 MG TABS tablet, Take 1 tablet (10 mg total) by mouth daily before breakfast., Disp: 90 tablet, Rfl: 1   HYDROcodone-acetaminophen (NORCO/VICODIN) 5-325 MG tablet, Take 1 tablet by mouth every 6 (six) hours as needed for moderate pain., Disp: 15 tablet, Rfl: 0   ibuprofen (ADVIL) 800 MG tablet, Take 1 tablet (800  mg total) by mouth every 8 (eight) hours as needed., Disp: 30 tablet, Rfl: 0   metFORMIN (GLUCOPHAGE) 500 MG tablet, Take 1 tablet (500 mg total) by mouth 2 (two) times daily with a meal., Disp: 180 tablet, Rfl: 3   sacubitril-valsartan (ENTRESTO) 24-26 MG, Take 1 tablet by mouth 2 (two) times daily., Disp: 180 tablet, Rfl: 1   sildenafil (REVATIO) 20 MG tablet, TAKE 1-3 TABLETS (20-60 MG TOTAL) BY MOUTH DAILY AS NEEDED (USE 30 MIN PRIOR TO SEXUAL ACTIVITY)., Disp: 15 tablet, Rfl: 1   spironolactone (ALDACTONE) 25 MG tablet, Take 1 tablet (25 mg total) by mouth daily. (Patient taking differently: Take 25 mg by mouth every morning.), Disp: 90 tablet, Rfl: 2  Patient Active Problem List   Diagnosis Date Noted   Abnormal prostate exam 05/09/2022   Left inguinal hernia 05/09/2022   Aortic atherosclerosis ([place]) 02/24/2022   Smokes with greater than 30 pack year history 11/18/2021   Coronary artery disease of native artery of native heart with stable angina pectoris ([place]) 05/19/2021   Dyslipidemia 07/30/2020   Polyp of colon    Diverticulosis of large intestine without diverticulitis    Low HDL (under 40) 05/13/2018   Type 2 diabetes mellitus, controlled ([place]) 02/08/2018   History of myocardial infarction 11/08/2017   Tobacco use 11/08/2017   Ischemic cardiomyopathy 11/08/2017   CHF (  congestive heart failure) ([place]) 11/08/2017   Foot callus 11/08/2017   Onychomycosis of multiple toenails with type 2 diabetes mellitus ([place]) 11/08/2017   Cataract, nuclear sclerotic, left eye 04/04/2016    Past Surgical History:  Procedure Laterality Date   CATARACT EXTRACTION Bilateral    COLONOSCOPY WITH PROPOFOL N/A 04/04/2019   Procedure: COLONOSCOPY WITH PROPOFOL;  Surgeon: Virgel Manifold, MD;  Location: [facility];  Service: Endoscopy;  Laterality: N/A;   LEFT HEART CATH AND CORONARY ANGIOGRAPHY N/A 07/21/2017   Procedure: LEFT HEART CATH AND CORONARY ANGIOGRAPHY;  Surgeon: Wellington Hampshire,  MD;  Location: [place] [facility];  Service: Cardiovascular;  Laterality: N/A;   XI ROBOTIC ASSISTED INGUINAL HERNIA REPAIR WITH MESH Left 05/19/2022   Procedure: XI ROBOTIC ASSISTED INGUINAL HERNIA REPAIR WITH MESH;  Surgeon: Ronny Bacon, MD;  Location: [facility];  Service: General;  Laterality: Left;    Family History  Problem Relation Age of Onset   Heart disease Mother    Hyperlipidemia Mother    Hypertension Mother    Heart attack Sister     Social History   Tobacco Use   Smoking status: Every Day    Packs/day: 1.00    Years: 44.00    Total pack years: 44.00    Types: Cigarettes    Start date: 08/21/1974   Smokeless tobacco: Never   Tobacco comments:    Started smoking around 66 y/o has stopped several times  Vaping Use   Vaping Use: Never used  Substance Use Topics   Alcohol use: Yes    Comment: rare   Drug use: No     No Known Allergies  Health Maintenance  Topic Date Due   Diabetic kidney evaluation - Urine ACR  11/11/2019   FOOT EXAM  05/19/2022   COVID-19 Vaccine (4 - Pfizer series) 06/11/2022 (Originally 11/06/2020)   Zoster Vaccines- Shingrix (1 of 2) 08/26/2022 (Originally 12/26/2004)   INFLUENZA VACCINE  11/19/2022 (Originally 03/21/2022)   TETANUS/TDAP  11/19/2022 (Originally 12/26/1973)   HEMOGLOBIN A1C  08/23/2022   OPHTHALMOLOGY EXAM  12/08/2022   Diabetic kidney evaluation - GFR measurement  05/08/2023   COLONOSCOPY (Pts 45-7yr Insurance coverage will need to be confirmed)  04/03/2024   Pneumonia Vaccine 66 Years old  Completed   Hepatitis C Screening  Completed   HPV VACCINES  Aged Out    Chart Review Today: I personally reviewed active problem list, medication list, allergies, family history, social history, health maintenance, notes from last encounter, lab results, imaging with the patient/caregiver today.   Review of Systems  Constitutional: Negative.   HENT: Negative.    Eyes: Negative.   Respiratory: Negative.    Cardiovascular:  Negative.   Gastrointestinal: Negative.   Endocrine: Negative.   Genitourinary: Negative.   Musculoskeletal: Negative.   Skin: Negative.   Allergic/Immunologic: Negative.   Neurological: Negative.   Hematological: Negative.   Psychiatric/Behavioral: Negative.    All other systems reviewed and are negative.    Objective:   Vitals:   05/26/22 0900  BP: 128/70  Pulse: 98  Resp: 16  Temp: 98.3 F (36.8 C)  TempSrc: Oral  SpO2: 98%  Weight: 210 lb 8 oz (95.5 kg)  Height: 6' (1.829 m)    Body mass index is 28.55 kg/m.  Physical Exam Vitals and nursing note reviewed.  Constitutional:      General: He is not in acute distress.    Appearance: Normal appearance. He is well-developed. He is not ill-appearing, toxic-appearing or diaphoretic.  Interventions: Face mask in place.  HENT:     Head: Normocephalic and atraumatic.     Jaw: No trismus.     Right Ear: External ear normal.     Left Ear: External ear normal.  Eyes:     General: Lids are normal. No scleral icterus.    Conjunctiva/sclera: Conjunctivae normal.     Pupils: Pupils are equal, round, and reactive to light.  Neck:     Trachea: Trachea and phonation normal. No tracheal deviation.  Cardiovascular:     Rate and Rhythm: Normal rate and regular rhythm.     Pulses: Normal pulses.          Radial pulses are 2+ on the right side and 2+ on the left side.       Posterior tibial pulses are 2+ on the right side and 2+ on the left side.     Heart sounds: Normal heart sounds. No murmur heard.    No friction rub. No gallop.  Pulmonary:     Effort: Pulmonary effort is normal. No respiratory distress.     Breath sounds: Normal breath sounds. No stridor. No wheezing, rhonchi or rales.  Abdominal:     General: Bowel sounds are normal. There is no distension.     Palpations: Abdomen is soft.     Tenderness: There is no abdominal tenderness. There is no guarding or rebound.  Musculoskeletal:        General: Normal range  of motion.     Cervical back: Normal range of motion and neck supple.     Right lower leg: No edema.     Left lower leg: No edema.  Skin:    General: Skin is warm and dry.     Capillary Refill: Capillary refill takes less than 2 seconds.     Coloration: Skin is not jaundiced.     Findings: No rash.     Nails: There is no clubbing.  Neurological:     Mental Status: He is alert.     Cranial Nerves: No dysarthria or facial asymmetry.     Motor: No tremor or abnormal muscle tone.     Gait: Gait normal.  Psychiatric:        Mood and Affect: Mood normal.        Speech: Speech normal.        Behavior: Behavior normal. Behavior is cooperative.      Diabetic Foot Exam - Simple   Simple Foot Form Diabetic Foot exam was performed with the following findings: Yes 05/26/2022  9:30 AM  Visual Inspection No deformities, no ulcerations, no other skin breakdown bilaterally: Yes Sensation Testing Intact to touch and monofilament testing bilaterally: Yes Pulse Check Posterior Tibialis and Dorsalis pulse intact bilaterally: Yes Comments Bilateral great toe nails, thickened and significantly raised, non-tender       Assessment & Plan:   Problem List Items Addressed This Visit       Cardiovascular and Mediastinum   Ischemic cardiomyopathy (Chronic)    Per cardiology       CHF (congestive heart failure) ([place]) (Chronic)    Managed by cardiology, last ECHO reviewed, pt on coreg, spironolactone and entresto, some improvement in EF      Coronary artery disease of native artery of native heart with stable angina pectoris ([place])    Doing well w/o any recent exertional sx      Aortic atherosclerosis ([place])    On statin, monitoring  Endocrine   Onychomycosis of multiple toenails with type 2 diabetes mellitus ([facility])    Great toe nail bilaterally Pt denies any pain He will contact podiatry for his f/up (given contact info)       Type 2 diabetes mellitus, controlled ([place])    Last  A1C was at goal but prior to that uncontrolled  on jardiance and metformin, due for labs and foot exam - both done today On statin and ARB, 81 mg ASA        Other   Dyslipidemia    Lipids have been well controlled with high intensity atorvastatin, due for annual labs He notes good compliance no new SE or concerns      Relevant Orders   COMPLETE METABOLIC PANEL WITH GFR (Completed)   Lipid Profile (Completed)   Other Visit Diagnoses     Uncontrolled type 2 diabetes mellitus with hyperglycemia ([place])    -  Primary   Relevant Orders   Microalbumin / creatinine urine ratio (Completed)   Hemoglobin A1c (Completed)   COMPLETE METABOLIC PANEL WITH GFR (Completed)   Need for influenza vaccination       declines   Need for shingles vaccine       declines   Need for tetanus, diphtheria, and acellular pertussis (Tdap) vaccine       declines        Return in about 6 months (around 11/25/2022) for 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   Delsa Grana, PA-C 05/26/22 9:19 AM

## 2022-05-27 LAB — MICROALBUMIN / CREATININE URINE RATIO
Creatinine, Urine: 100 mg/dL (ref 20–320)
Microalb Creat Ratio: 3 mcg/mg creat (ref <30)
Microalb, Ur: 0.3 mg/dL

## 2022-05-27 LAB — COMPLETE METABOLIC PANEL WITH GFR
AG Ratio: 1.5 (calc) (ref 1.0–2.5)
ALT: 27 U/L (ref 9–46)
AST: 16 U/L (ref 10–35)
Albumin: 4.1 g/dL (ref 3.6–5.1)
Alkaline phosphatase (APISO): 57 U/L (ref 35–144)
BUN: 17 mg/dL (ref 7–25)
CO2: 24 mmol/L (ref 20–32)
Calcium: 9.9 mg/dL (ref 8.6–10.3)
Chloride: 106 mmol/L (ref 98–110)
Creat: 1.25 mg/dL (ref 0.70–1.35)
Globulin: 2.8 g/dL (calc) (ref 1.9–3.7)
Glucose, Bld: 102 mg/dL (ref 65–139)
Potassium: 4.3 mmol/L (ref 3.5–5.3)
Sodium: 141 mmol/L (ref 135–146)
Total Bilirubin: 0.7 mg/dL (ref 0.2–1.2)
Total Protein: 6.9 g/dL (ref 6.1–8.1)
eGFR: 63 mL/min/{1.73_m2} (ref >=60)

## 2022-05-27 LAB — LIPID PANEL
Cholesterol: 135 mg/dL (ref <200)
HDL: 24 mg/dL — ABNORMAL LOW (ref >=40)
LDL Cholesterol (Calc): 84 mg/dL (calc)
Non-HDL Cholesterol (Calc): 111 mg/dL (calc) (ref <130)
Total CHOL/HDL Ratio: 5.6 (calc) — ABNORMAL HIGH (ref <5.0)
Triglycerides: 176 mg/dL — ABNORMAL HIGH (ref <150)

## 2022-05-27 LAB — HEMOGLOBIN A1C
Hgb A1c MFr Bld: 6 % of total Hgb — ABNORMAL HIGH (ref <5.7)
Mean Plasma Glucose: 126 mg/dL
eAG (mmol/L): 7 mmol/L

## 2022-05-29 ENCOUNTER — Encounter: Payer: Self-pay | Admitting: Family Medicine

## 2022-05-29 NOTE — Assessment & Plan Note (Signed)
Per cardiology 

## 2022-05-29 NOTE — Assessment & Plan Note (Signed)
Great toe nail bilaterally Pt denies any pain He will contact podiatry for his f/up (given contact info)

## 2022-05-29 NOTE — Assessment & Plan Note (Signed)
Last A1C was at goal but prior to that uncontrolled  on jardiance and metformin, due for labs and foot exam - both done today On statin and ARB, 81 mg ASA

## 2022-05-29 NOTE — Assessment & Plan Note (Signed)
Doing well w/o any recent exertional sx

## 2022-05-29 NOTE — Assessment & Plan Note (Signed)
On statin, monitoring 

## 2022-05-29 NOTE — Assessment & Plan Note (Signed)
Managed by cardiology, last ECHO reviewed, pt on coreg, spironolactone and entresto, some improvement in EF

## 2022-05-29 NOTE — Assessment & Plan Note (Signed)
Lipids have been well controlled with high intensity atorvastatin, due for annual labs He notes good compliance no new SE or concerns

## 2022-06-06 ENCOUNTER — Encounter: Payer: Self-pay | Admitting: Physician Assistant

## 2022-06-06 ENCOUNTER — Ambulatory Visit (INDEPENDENT_AMBULATORY_CARE_PROVIDER_SITE_OTHER): Payer: 59 | Admitting: Physician Assistant

## 2022-06-06 VITALS — BP 105/66 | HR 94 | Temp 97.9°F | Wt 206.0 lb

## 2022-06-06 DIAGNOSIS — K4090 Unilateral inguinal hernia, without obstruction or gangrene, not specified as recurrent: Secondary | ICD-10-CM

## 2022-06-06 DIAGNOSIS — Z09 Encounter for follow-up examination after completed treatment for conditions other than malignant neoplasm: Secondary | ICD-10-CM

## 2022-06-06 NOTE — Progress Notes (Signed)
[facility] POST-OP OFFICE VISIT  06/06/2022  HPI: Lawrence Holmes is a 66 y.o. male 18 days s/p robotic assisted laparoscopic left inguinal hernia repair with Dr Christian Mate   Doing very well; He is pleasantly surprised No significant pain No fever, chills, nausea, emesis No issues with incisions No swelling/bruising of the scrotum No other complaints   Vital signs: BP 105/66   Pulse 94   Temp 97.9 F (36.6 C) (Oral)   Wt 206 lb (93.4 kg)   SpO2 93%   BMI 27.94 kg/m    Physical Exam: Constitutional: Well appearing male, NAD Abdomen: Soft, non-tender, non-distended, no rebound/guarding Skin: Laparoscopic incisions are healing well, no erythema or drainage   Assessment/Plan: This is a 66 y.o. male 18 days s/p robotic assisted laparoscopic left inguinal hernia repair   - Pain control prn  - Reviewed wound care recommendation  - Reviewed lifting restrictions; 6 weeks total  - He can follow up on as needed basis; He understands to call with questions/concerns  -- Lawrence Simon, PA-C [facility] 06/06/2022, 2:34 PM M-F: 7am - 4pm

## 2022-06-06 NOTE — Patient Instructions (Signed)
If you have any concerns or questions, please feel free to call our office.    GENERAL POST-OPERATIVE PATIENT INSTRUCTIONS   WOUND CARE INSTRUCTIONS:  Keep a dry clean dressing on the wound if there is drainage. The initial bandage may be removed after 24 hours.  Once the wound has quit draining you may leave it open to air.  If clothing rubs against the wound or causes irritation and the wound is not draining you may cover it with a dry dressing during the daytime.  Try to keep the wound dry and avoid ointments on the wound unless directed to do so.  If the wound becomes bright red and painful or starts to drain infected material that is not clear, please contact your physician immediately.  If the wound is mildly pink and has a thick firm ridge underneath it, this is normal, and is referred to as a healing ridge.  This will resolve over the next 4-6 weeks.  BATHING: You may shower if you have been informed of this by your surgeon. However, Please do not submerge in a tub, hot tub, or pool until incisions are completely sealed or have been told by your surgeon that you may do so.  DIET:  You may eat any foods that you can tolerate.  It is a good idea to eat a high fiber diet and take in plenty of fluids to prevent constipation.  If you do become constipated you may want to take a mild laxative or take ducolax tablets on a daily basis until your bowel habits are regular.  Constipation can be very uncomfortable, along with straining, after recent surgery.  ACTIVITY:  You are encouraged to cough and deep breath or use your incentive spirometer if you were given one, every 15-30 minutes when awake.  This will help prevent respiratory complications and low grade fevers post-operatively if you had a general anesthetic.  You may want to hug a pillow when coughing and sneezing to add additional support to the surgical area, if you had abdominal or chest surgery, which will decrease pain during these times.   You are encouraged to walk and engage in light activity for the next two weeks.  You should not lift more than 20 pounds for 6 weeks total after surgery as it could put you at increased risk for complications.  Twenty pounds is roughly equivalent to a plastic bag of groceries. At that time- Listen to your body when lifting, if you have pain when lifting, stop and then try again in a few days. Soreness after doing exercises or activities of daily living is normal as you get back in to your normal routine.  MEDICATIONS:  Try to take narcotic medications and anti-inflammatory medications, such as tylenol, ibuprofen, naprosyn, etc., with food.  This will minimize stomach upset from the medication.  Should you develop nausea and vomiting from the pain medication, or develop a rash, please discontinue the medication and contact your physician.  You should not drive, make important decisions, or operate machinery when taking narcotic pain medication.  SUNBLOCK Use sun block to incision area over the next year if this area will be exposed to sun. This helps decrease scarring and will allow you avoid a permanent darkened area over your incision.  QUESTIONS:  Please feel free to call our office if you have any questions, and we will be glad to assist you. [phone]   

## 2022-06-14 ENCOUNTER — Encounter: Payer: Self-pay | Admitting: Urology

## 2022-06-14 ENCOUNTER — Ambulatory Visit (INDEPENDENT_AMBULATORY_CARE_PROVIDER_SITE_OTHER): Payer: 59 | Admitting: Urology

## 2022-06-14 VITALS — BP 113/78 | HR 92 | Ht 72.0 in | Wt 210.0 lb

## 2022-06-14 DIAGNOSIS — R3912 Poor urinary stream: Secondary | ICD-10-CM | POA: Diagnosis not present

## 2022-06-14 DIAGNOSIS — N529 Male erectile dysfunction, unspecified: Secondary | ICD-10-CM | POA: Diagnosis not present

## 2022-06-14 DIAGNOSIS — N401 Enlarged prostate with lower urinary tract symptoms: Secondary | ICD-10-CM

## 2022-06-14 DIAGNOSIS — R351 Nocturia: Secondary | ICD-10-CM | POA: Diagnosis not present

## 2022-06-14 DIAGNOSIS — N400 Enlarged prostate without lower urinary tract symptoms: Secondary | ICD-10-CM

## 2022-06-14 LAB — BLADDER SCAN AMB NON-IMAGING

## 2022-06-14 MED ORDER — TAMSULOSIN HCL 0.4 MG PO CAPS: 0.4000 mg | ORAL_CAPSULE | Freq: Every day | ORAL | 3 refills | Status: DC

## 2022-06-14 MED ORDER — SILDENAFIL CITRATE 20 MG PO TABS: 20.0000 mg | ORAL_TABLET | Freq: Every day | ORAL | 1 refills | Status: DC | PRN

## 2022-06-14 NOTE — Progress Notes (Signed)
06/14/22 2:56 PM   Lawrence Holmes 07-Jul-1956 [id number]  CC: Enlarged prostate on CT, nocturia, weak stream, ED  HPI: 66 year old male who was seen in the ER for left lower quadrant pain and diagnosed with a inguinal hernia, and ultimately underwent surgical repair with Dr. Christian Mate on 05/19/2022.  Incidentally found was enlarged prostate measuring 80 g with a median lobe, no hydronephrosis or other urologic abnormalities and urology follow-up was recommended.  He reports some problems with some weak urinary stream and nocturia 1-2 times at night.  PVR is elevated today at 294 mL.  IPSS score 12, with quality of life mostly satisfied.  He denies any gross hematuria or dysuria, no UTIs or retention.  No prior PSA values to review.  He denies any family history of prostate cancer.   PMH: Past Medical History:  Diagnosis Date   Aortic atherosclerosis ([place])    Arthritis    Chronic systolic heart failure ([place]) 07/23/2017   a.) TTE 07/23/2017: EF 30-35%, mild LVH, inferolateral and inferior AK, mild BAE, G1DD; b.) TTE 10/23/2017: EF 40-45%, mild LVH, basal-mid inferior and inferoseptal AK, mild MR; c.) TTE 11/19/2021: EF 45-50%, glob HK with mod to severe inferior and basal to mid septal HK, GLS -15.3%, mild MR, G1DD.   Coronary artery disease 07/21/2017   a. 07/2017 Late presenting inferior STEMI/Cath: LM nl, LAD min irregs, LCX 100d (L->L collats), RCA 100p (L->R collats), EF 25-35%-->Med Rx; b. 10/2017 ETT (DOT): Ex time 7:38, baseline antlat TWI, no acute changes.    DDD (degenerative disc disease), lumbar    Diverticulosis of colon    Diverticulosis of large intestine without diverticulitis    Enlarged prostate    Erectile dysfunction    a.) on PDE5i (sildenafil) PRN   HTN (hypertension)    Hx of bilateral cataract extraction    Hyperlipidemia    Ischemic cardiomyopathy    a.) LHC 07/21/2017: EF 25-35%; b.) TTE 07/23/2017: 30-35%; c.) TTE 10/23/2017: EF 40-45%; d.) TTE 11/19/2021: EF  45-50%   Left inguinal hernia    Left inguinal hernia 05/09/2022   ST elevation myocardial infarction (STEMI) of inferior wall ([place]) 07/21/2017   a.) LHC 07/21/2017: EF 25-35%, LVEDP 14 mmHg. 40% pLCx, 40% mLCx, 100% dLCx, 100% pRCA --> attempted to cross wire at RCA lesion, however unable. Reasonable RCA collaterals already formed --> med mgmt.   T2DM (type 2 diabetes mellitus) ([place])    Tobacco use     Surgical History: Past Surgical History:  Procedure Laterality Date   CATARACT EXTRACTION Bilateral    COLONOSCOPY WITH PROPOFOL N/A 04/04/2019   Procedure: COLONOSCOPY WITH PROPOFOL;  Surgeon: Virgel Manifold, MD;  Location: [facility] ENDOSCOPY;  Service: Endoscopy;  Laterality: N/A;   LEFT HEART CATH AND CORONARY ANGIOGRAPHY N/A 07/21/2017   Procedure: LEFT HEART CATH AND CORONARY ANGIOGRAPHY;  Surgeon: Wellington Hampshire, MD;  Location: [facility];  Service: Cardiovascular;  Laterality: N/A;   XI ROBOTIC ASSISTED INGUINAL HERNIA REPAIR WITH MESH Left 05/19/2022   Procedure: XI ROBOTIC ASSISTED INGUINAL HERNIA REPAIR WITH MESH;  Surgeon: Ronny Bacon, MD;  Location: [facility] ORS;  Service: General;  Laterality: Left;     Family History: Family History  Problem Relation Age of Onset   Heart disease Mother    Hyperlipidemia Mother    Hypertension Mother    Heart attack Sister     Social History:  reports that he has been smoking cigarettes. He started smoking about 47 years ago. He has  a 44.00 pack-year smoking history. He has been exposed to tobacco smoke. He has never used smokeless tobacco. He reports current alcohol use. He reports that he does not use drugs.  Physical Exam: BP 113/78   Pulse 92   Ht 6' (1.829 m)   Wt 210 lb (95.3 kg)   BMI 28.48 kg/m    Constitutional:  Alert and oriented, No acute distress. Cardiovascular: No clubbing, cyanosis, or edema. Respiratory: Normal respiratory effort, no increased work of breathing. GI: Abdomen is soft, nontender,  nondistended, no abdominal masses DRE: Limited by body habitus, 80 g, smooth, no nodules or masses   Pertinent Imaging: I have personally viewed and interpreted the CT showing a 81g prostate, prominent median lobe  Assessment & Plan:   66 year old male with enlarged prostate on CT, mild urinary symptoms of weak stream and nocturia but elevated PVR of 215m in clinic today.  We reviewed the risks and benefits of PSA screening, and he opts for a PSA today.  We also discussed a trial of Flomax 0.4 mg nightly for his weak stream and incomplete emptying, and risks and benefits were discussed.  We reviewed potential need for prostate biopsy or prostate MRI in the future if PSA is elevated.  -PSA today with reflex to free, call with results -Trial of Flomax 0.4 mg nightly for BPH with incomplete emptying -RTC 1 month symptom check and PVR.  If PSA elevated today, will repeat PSA at follow-up prior to considering biopsy or MRI  BNickolas Madrid MD 06/14/2022  [address] [place] [phone]

## 2022-06-14 NOTE — Patient Instructions (Signed)
Prostate Cancer Screening  Prostate cancer screening is testing that is done to check for the presence of prostate cancer in men. The prostate gland is a walnut-sized gland that is located below the bladder and in front of the rectum in males. The function of the prostate is to add fluid to semen during ejaculation. Prostate cancer is one of the most common types of cancer in men. Who should have prostate cancer screening? Screening recommendations vary based on age and other risk factors, as well as between the professional organizations who make the recommendations. In general, screening is recommended if: You are age 50 to 70 and have an average risk for prostate cancer. You should talk with your health care provider about your need for screening and how often screening should be done. Because most prostate cancers are slow growing and will not cause death, screening in this age group is generally reserved for men who have a 10- to 15-year life expectancy. You are younger than age 50, and you have these risk factors: Having a father, brother, or uncle who has been diagnosed with prostate cancer. The risk is higher if your family member's cancer occurred at an early age or if you have multiple family members with prostate cancer at an early age. Being a male who is Black or is of Caribbean or sub-Saharan African descent. In general, screening is not recommended if: You are younger than age 40. You are between the ages of 40 and 49 and you have no risk factors. You are 70 years of age or older. At this age, the risks that screening can cause are greater than the benefits that it may provide. If you are at high risk for prostate cancer, your health care provider may recommend that you have screenings more often or that you start screening at a younger age. How is screening for prostate cancer done? The recommended prostate cancer screening test is a blood test called the prostate-specific antigen  (PSA) test. PSA is a protein that is made in the prostate. As you age, your prostate naturally produces more PSA. Abnormally high PSA levels may be caused by: Prostate cancer. An enlarged prostate that is not caused by cancer (benign prostatic hyperplasia, or BPH). This condition is very common in older men. A prostate gland infection (prostatitis) or urinary tract infection. Certain medicines such as male hormones (like testosterone) or other medicines that raise testosterone levels. A rectal exam may be done as part of prostate cancer screening to help provide information about the size of your prostate gland. When a rectal exam is performed, it should be done after the PSA level is drawn to avoid any effect on the results. Depending on the PSA results, you may need more tests, such as: A physical exam to check the size of your prostate gland, if not done as part of screening. Blood and imaging tests. A procedure to remove tissue samples from your prostate gland for testing (biopsy). This is the only way to know for certain if you have prostate cancer. What are the benefits of prostate cancer screening? Screening can help to identify cancer at an early stage, before symptoms start and when the cancer can be treated more easily. There is a small chance that screening may lower your risk of dying from prostate cancer. The chance is small because prostate cancer is a slow-growing cancer, and most men with prostate cancer die from a different cause. What are the risks of prostate cancer screening? The   main risk of prostate cancer screening is diagnosing and treating prostate cancer that would never have caused any symptoms or problems. This is called overdiagnosisand overtreatment. PSA screening cannot tell you if your PSA is high due to cancer or a different cause. A prostate biopsy is the only procedure to diagnose prostate cancer. Even the results of a biopsy may not tell you if your cancer needs to  be treated. Slow-growing prostate cancer may not need any treatment other than monitoring, so diagnosing and treating it may cause unnecessary stress or other side effects. Questions to ask your health care provider When should I start prostate cancer screening? What is my risk for prostate cancer? How often do I need screening? What type of screening tests do I need? How do I get my test results? What do my results mean? Do I need treatment? Where to find more information The American Cancer Society: www.cancer.org American Urological Association: www.auanet.org Contact a health care provider if: You have difficulty urinating. You have pain when you urinate or ejaculate. You have blood in your urine or semen. You have pain in your back or in the area of your prostate. Summary Prostate cancer is a common type of cancer in men. The prostate gland is located below the bladder and in front of the rectum. This gland adds fluid to semen during ejaculation. Prostate cancer screening may identify cancer at an early stage, when the cancer can be treated more easily and is less likely to have spread to other areas of the body. The prostate-specific antigen (PSA) test is the recommended screening test for prostate cancer, but it has associated risks. Discuss the risks and benefits of prostate cancer screening with your health care provider. If you are age 70 or older, the risks that screening can cause are greater than the benefits that it may provide. This information is not intended to replace advice given to you by your health care provider. Make sure you discuss any questions you have with your health care provider. Document Revised: 01/31/2021 Document Reviewed: 01/31/2021 Elsevier Patient Education  2023 Elsevier Inc.  Benign Prostatic Hyperplasia  Benign prostatic hyperplasia (BPH) is an enlarged prostate gland that is caused by the normal aging process. The prostate may get bigger as a man  gets older. The condition is not caused by cancer. The prostate is a walnut-sized gland that is involved in the production of semen. It is located in front of the rectum and below the bladder. The bladder stores urine. The urethra carries stored urine out of the body. An enlarged prostate can press on the urethra. This can make it harder to pass urine. The buildup of urine in the bladder can cause infection. Back pressure and infection may progress to bladder damage and kidney (renal) failure. What are the causes? This condition is part of the normal aging process. However, not all men develop problems from this condition. If the prostate enlarges away from the urethra, urine flow will not be blocked. If it enlarges toward the urethra and compresses it, there will be problems passing urine. What increases the risk? This condition is more likely to develop in men older than 50 years. What are the signs or symptoms? Symptoms of this condition include: Getting up often during the night to urinate. Needing to urinate frequently during the day. Difficulty starting urine flow. Decrease in size and strength of your urine stream. Leaking (dribbling) after urinating. Inability to pass urine. This needs immediate treatment. Inability to completely   empty your bladder. Pain when you pass urine. This is more common if there is also an infection. Urinary tract infection (UTI). How is this diagnosed? This condition is diagnosed based on your medical history, a physical exam, and your symptoms. Tests will also be done, such as: A post-void bladder scan. This measures any amount of urine that may remain in your bladder after you finish urinating. A digital rectal exam. In a rectal exam, your health care provider checks your prostate by putting a lubricated, gloved finger into your rectum to feel the back of your prostate gland. This exam detects the size of your gland and any abnormal lumps or growths. An exam  of your urine (urinalysis). A prostate specific antigen (PSA) screening. This is a blood test used to screen for prostate cancer. An ultrasound. This test uses sound waves to electronically produce a picture of your prostate gland. Your health care provider may refer you to a specialist in kidney and prostate diseases (urologist). How is this treated? Once symptoms begin, your health care provider will monitor your condition (active surveillance or watchful waiting). Treatment for this condition will depend on the severity of your condition. Treatment may include: Observation and yearly exams. This may be the only treatment needed if your condition and symptoms are mild. Medicines to relieve your symptoms, including: Medicines to shrink the prostate. Medicines to relax the muscle of the prostate. Surgery in severe cases. Surgery may include: Prostatectomy. In this procedure, the prostate tissue is removed completely through an open incision or with a laparoscope or robotics. Transurethral resection of the prostate (TURP). In this procedure, a tool is inserted through the opening at the tip of the penis (urethra). It is used to cut away tissue of the inner core of the prostate. The pieces are removed through the same opening of the penis. This removes the blockage. Transurethral incision (TUIP). In this procedure, small cuts are made in the prostate. This lessens the prostate's pressure on the urethra. Transurethral microwave thermotherapy (TUMT). This procedure uses microwaves to create heat. The heat destroys and removes a small amount of prostate tissue. Transurethral needle ablation (TUNA). This procedure uses radio frequencies to destroy and remove a small amount of prostate tissue. Interstitial laser coagulation (ILC). This procedure uses a laser to destroy and remove a small amount of prostate tissue. Transurethral electrovaporization (TUVP). This procedure uses electrodes to destroy and  remove a small amount of prostate tissue. Prostatic urethral lift. This procedure inserts an implant to push the lobes of the prostate away from the urethra. Follow these instructions at home: Take over-the-counter and prescription medicines only as told by your health care provider. Monitor your symptoms for any changes. Contact your health care provider with any changes. Avoid drinking large amounts of liquid before going to bed or out in public. Avoid or reduce how much caffeine or alcohol you drink. Give yourself time when you urinate. Keep all follow-up visits. This is important. Contact a health care provider if: You have unexplained back pain. Your symptoms do not get better with treatment. You develop side effects from the medicine you are taking. Your urine becomes very dark or has a bad smell. Your lower abdomen becomes distended and you have trouble passing urine. Get help right away if: You have a fever or chills. You suddenly cannot urinate. You feel light-headed or very dizzy, or you faint. There are large amounts of blood or clots in your urine. Your urinary problems become hard to manage.   You develop moderate to severe low back or flank pain. The flank is the side of your body between the ribs and the hip. These symptoms may be an emergency. Get help right away. Call 911. Do not wait to see if the symptoms will go away. Do not drive yourself to the hospital. Summary Benign prostatic hyperplasia (BPH) is an enlarged prostate that is caused by the normal aging process. It is not caused by cancer. An enlarged prostate can press on the urethra. This can make it hard to pass urine. This condition is more likely to develop in men older than 50 years. Get help right away if you suddenly cannot urinate. This information is not intended to replace advice given to you by your health care provider. Make sure you discuss any questions you have with your health care  provider. Document Revised: 02/23/2021 Document Reviewed: 02/23/2021 Elsevier Patient Education  2023 Elsevier Inc.  

## 2022-06-15 LAB — FPSA% REFLEX
% FREE PSA: 23.3 %
PSA, FREE: 1.12 ng/mL

## 2022-06-15 LAB — PSA TOTAL (REFLEX TO FREE): Prostate Specific Ag, Serum: 4.8 ng/mL — ABNORMAL HIGH (ref 0.0–4.0)

## 2022-07-26 ENCOUNTER — Telehealth: Payer: Self-pay | Admitting: *Deleted

## 2022-07-26 NOTE — Telephone Encounter (Signed)
Faxed FMLA to [employer] at [phone]

## 2022-07-27 ENCOUNTER — Ambulatory Visit: Payer: 59 | Admitting: Urology

## 2022-08-09 ENCOUNTER — Encounter: Payer: Self-pay | Admitting: Urology

## 2022-08-09 ENCOUNTER — Ambulatory Visit (INDEPENDENT_AMBULATORY_CARE_PROVIDER_SITE_OTHER): Payer: 59 | Admitting: Urology

## 2022-08-09 VITALS — BP 121/74 | HR 101 | Ht 72.0 in | Wt 210.0 lb

## 2022-08-09 DIAGNOSIS — Z125 Encounter for screening for malignant neoplasm of prostate: Secondary | ICD-10-CM | POA: Diagnosis not present

## 2022-08-09 DIAGNOSIS — N400 Enlarged prostate without lower urinary tract symptoms: Secondary | ICD-10-CM

## 2022-08-09 DIAGNOSIS — N401 Enlarged prostate with lower urinary tract symptoms: Secondary | ICD-10-CM

## 2022-08-09 DIAGNOSIS — N138 Other obstructive and reflux uropathy: Secondary | ICD-10-CM

## 2022-08-09 LAB — BLADDER SCAN AMB NON-IMAGING

## 2022-08-09 MED ORDER — TAMSULOSIN HCL 0.4 MG PO CAPS: 0.4000 mg | ORAL_CAPSULE | Freq: Every day | ORAL | 11 refills | Status: DC

## 2022-08-09 NOTE — Progress Notes (Signed)
   08/09/2022 3:22 PM   Lawrence Holmes [birth year] [id number]  Reason for visit: Follow up BPH, PSA screening  HPI: 66 year old male who underwent a hernia repair in September 2023 and was found to have an enlarged prostate on CT at that time and was referred to urology.  CT showed an 80 g prostate with a large median lobe, no hydronephrosis, and he had urinary symptoms of weak stream and nocturia 2-3 times overnight.  PVR was also elevated at her first visit at 230m.  We checked a PSA which was 4.8 with reassuring 23% free, and reassuring PSA density of 0.06.  Using shared decision making he opted to defer biopsy.  We also started Flomax for his elevated PVR, urinary symptoms, and nocturia, and he has noticed a significant improvement on that medication.  He is urinating with a much stronger stream and getting up 0-1 time overnight.  Bladder scan today 166 mL, and his last void was 1.5 hours ago.  We reviewed that he is emptying much better on the Flomax and symptoms have improved.  He would like to continue the Flomax.  Return precautions discussed including worsening urinary symptoms, urinary retention, UTI, gross hematuria.  Risks and benefits of PSA screening were also reviewed, and reassurance provided regarding his reassuring PSA density and elevated percentage free PSA.  Flomax refilled RTC 1 year PVR  BBilley Co MD  [facility] SMississippi Valley State UniversityBFuller Heights [place] [phone]

## 2022-08-15 ENCOUNTER — Other Ambulatory Visit: Payer: Self-pay | Admitting: Urology

## 2022-09-04 ENCOUNTER — Other Ambulatory Visit: Payer: Self-pay

## 2022-09-04 MED ORDER — SPIRONOLACTONE 25 MG PO TABS: 25.0000 mg | ORAL_TABLET | Freq: Every day | ORAL | 0 refills | Status: DC

## 2022-09-15 IMAGING — CT CT CHEST LUNG CANCER SCREENING LOW DOSE W/O CM
2 of 5 series · 15 of 40 positions shown, 18 images · non-contrast
Comparison: None Available.

CLINICAL DATA: Current smoker with 37 pack-year history



[Series 3: lung 1.00 · axial · 0.78mm/px · z∈[-1198,-911]mm · 12 of 317 slices shown, 15 images]
[im 15/317  mediastinal]
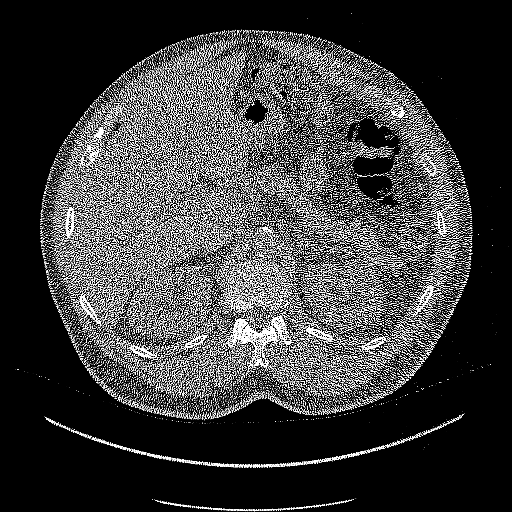
[im 15/317  lung]
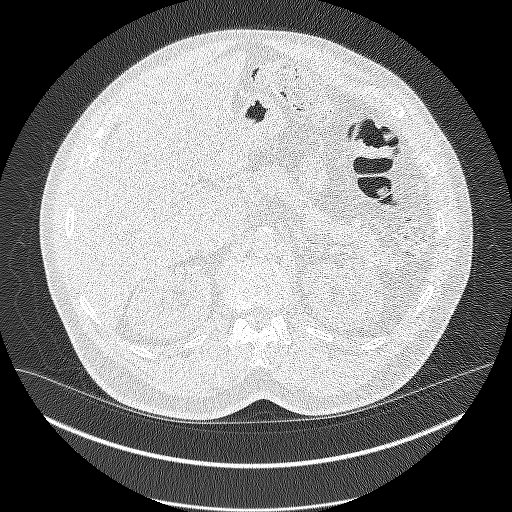
[im 44/317  lung]
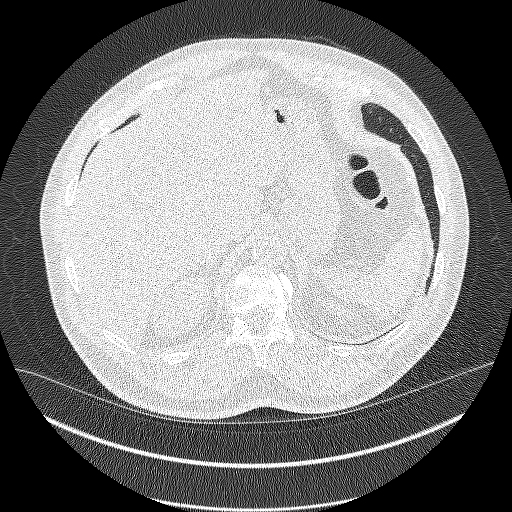
[im 72/317  lung]
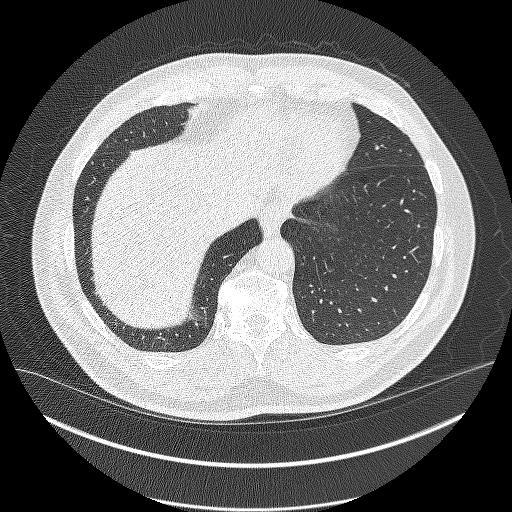
[im 101/317  lung]
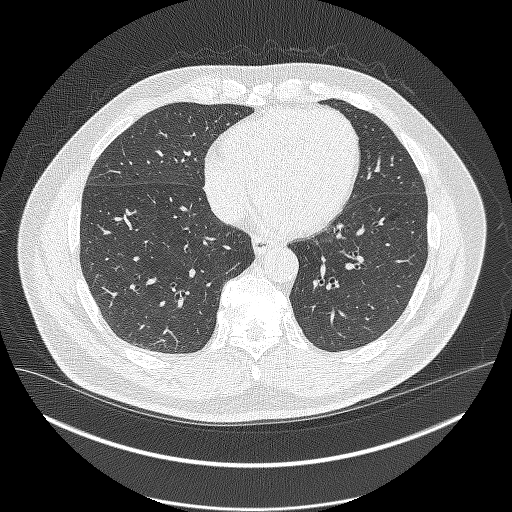
[im 115/317  mediastinal]
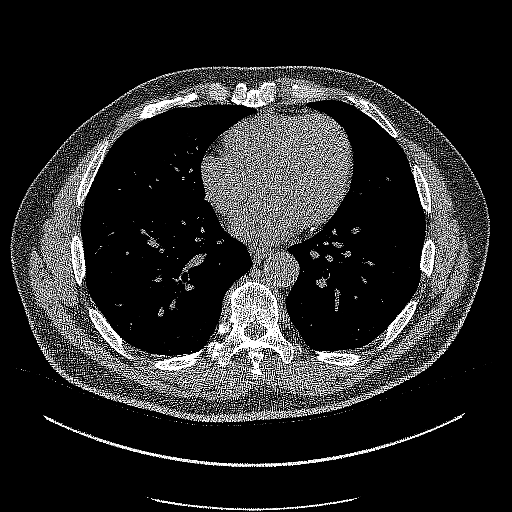
[im 115/317  lung]
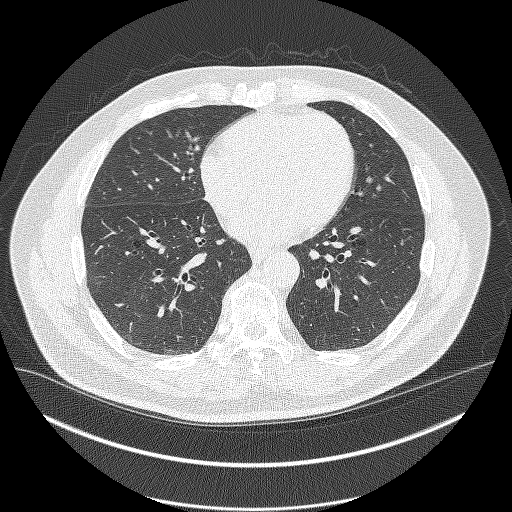
[im 144/317  lung]
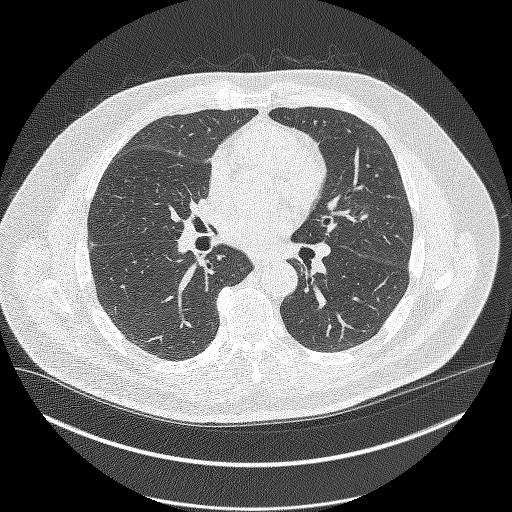
[im 173/317  lung]
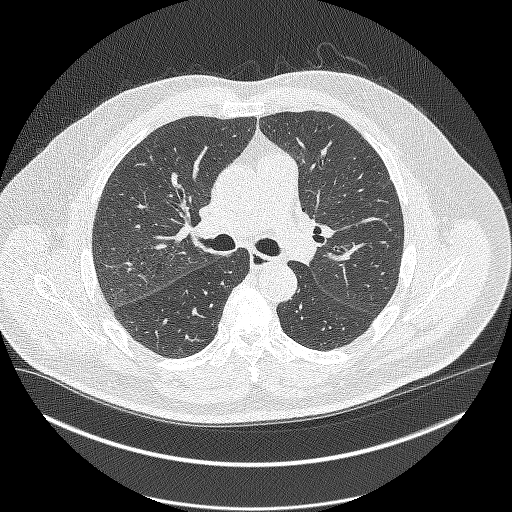
[im 202/317  lung]
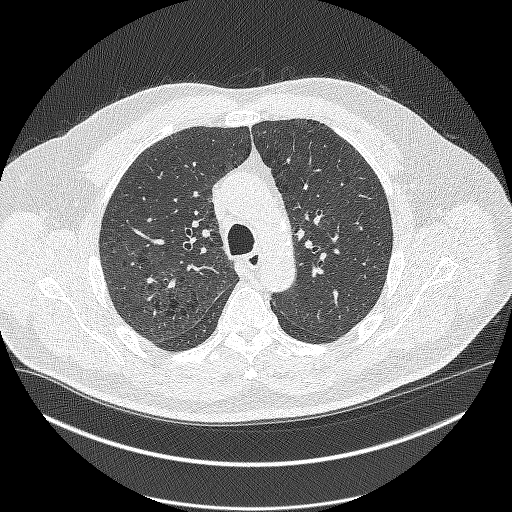
[im 216/317  mediastinal]
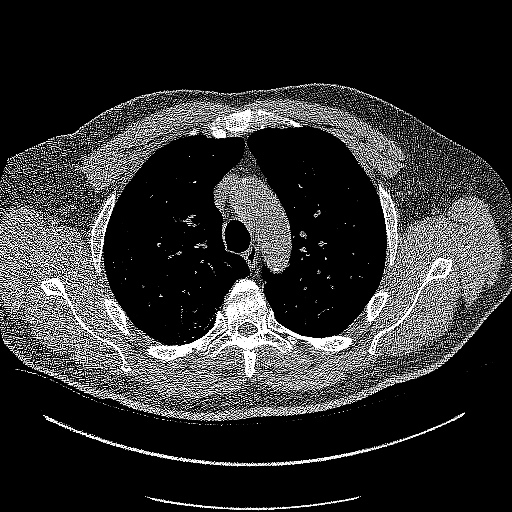
[im 216/317  lung]
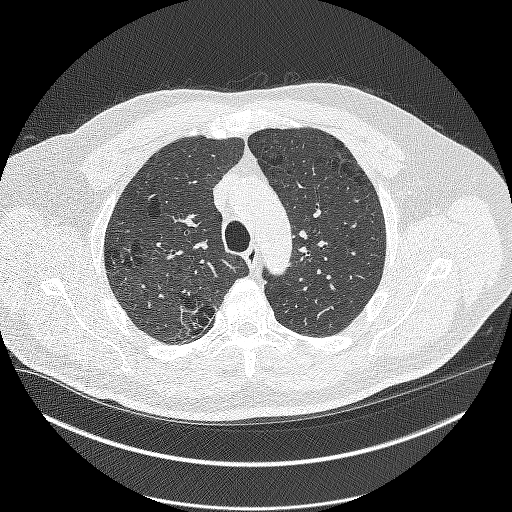
[im 245/317  lung]
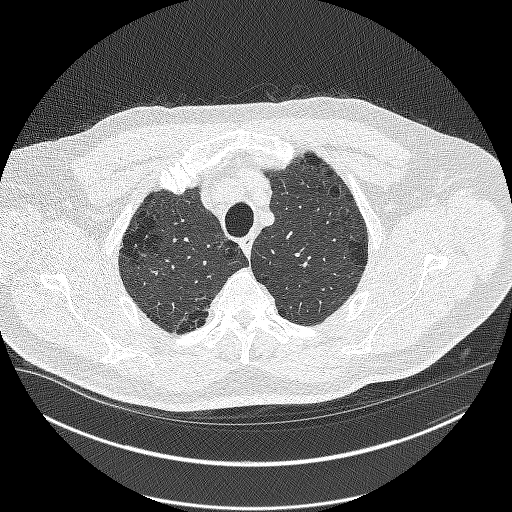
[im 273/317  lung]
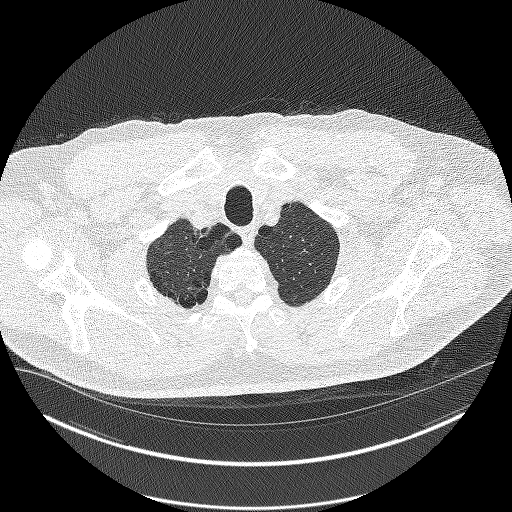
[im 302/317  lung]
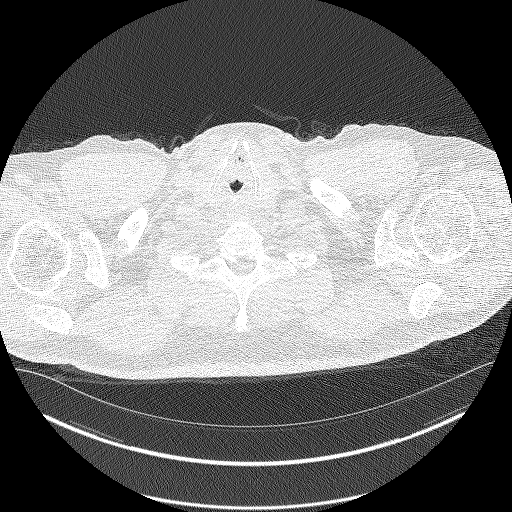

[Series 5: coronals lung 1.00 cor · coronal · 0.62mm/px · 3 of 364 slices shown]
[im 73/364  lung]
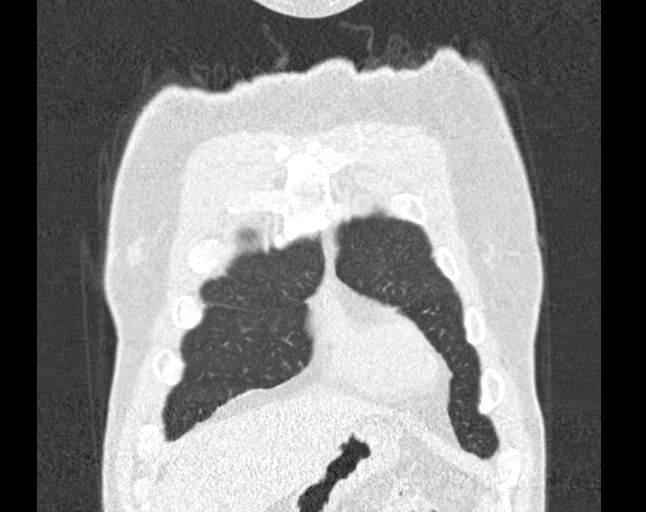
[im 146/364  lung]
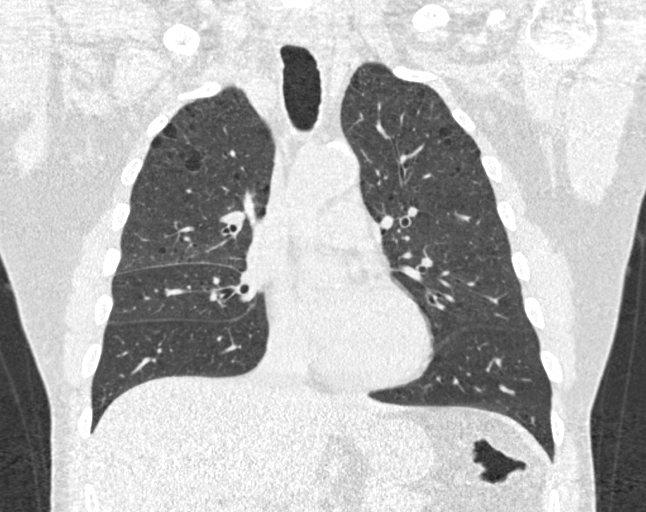
[im 218/364  lung]
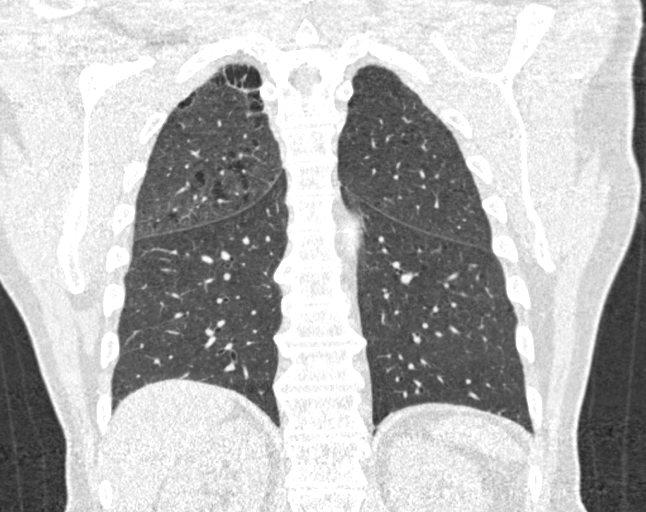

[15 of 40 positions shown; findings below may reference images not displayed]

FINDINGS: Cardiovascular: Normal heart size. No pericardial effusion. Mild
left main and three-vessel coronary artery calcifications.
Atherosclerotic disease of the thoracic aorta.

Mediastinum/Nodes: Small hiatal hernia. No pathologically enlarged
lymph nodes seen in the chest.

Lungs/Pleura: Central airways are patent. Centrilobular and
paraseptal emphysema. No consolidation, pleural effusion or
pneumothorax. Small solid pulmonary nodules. Reference nodule of the
left upper lobe measures 3.3 mm in mean diameter on image 86.

Upper Abdomen: No acute abnormality.

Musculoskeletal: No chest wall mass or suspicious bone lesions
identified.
IMPRESSION: 1. Lung-RADS 2S, benign appearance or behavior. Continue annual
screening with low-dose chest CT without contrast in 12 months. S
modifier for coronary artery calcifications.
2. Mild left main and three-vessel coronary artery calcifications,
recommend ASCVD risk assessment.
3. Aortic Atherosclerosis (A90UL-H3D.D) and Emphysema (A90UL-69N.C).

## 2022-10-03 ENCOUNTER — Ambulatory Visit: Payer: 59 | Admitting: Family Medicine

## 2022-10-03 ENCOUNTER — Other Ambulatory Visit: Payer: Self-pay

## 2022-10-03 ENCOUNTER — Encounter: Payer: Self-pay | Admitting: Family Medicine

## 2022-10-03 VITALS — BP 124/68 | HR 97 | Temp 97.5°F | Resp 16 | Ht 72.0 in | Wt 213.8 lb

## 2022-10-03 DIAGNOSIS — E119 Type 2 diabetes mellitus without complications: Secondary | ICD-10-CM | POA: Diagnosis not present

## 2022-10-03 DIAGNOSIS — E785 Hyperlipidemia, unspecified: Secondary | ICD-10-CM | POA: Diagnosis not present

## 2022-10-03 LAB — POCT GLYCOSYLATED HEMOGLOBIN (HGB A1C): Hemoglobin A1C: 6.7 % — AB (ref 4.0–5.6)

## 2022-10-03 MED ORDER — EMPAGLIFLOZIN 10 MG PO TABS: 10.0000 mg | ORAL_TABLET | Freq: Every day | ORAL | 1 refills | Status: DC

## 2022-10-03 MED ORDER — ENTRESTO 24-26 MG PO TABS: 1.0000 | ORAL_TABLET | Freq: Two times a day (BID) | ORAL | 1 refills | Status: DC

## 2022-10-03 NOTE — Assessment & Plan Note (Signed)
Compliant with meds, no SE, no myalgias, fatigue or jaundice Due for FLP and recheck CMP Diet and exercise recommendations for HLD reviewed and handout given Continue lipitor 80 - per cardiology LDL goal?  May be to have below 70  Recommend diet efforts and we will recheck at his next appt

## 2022-10-03 NOTE — Assessment & Plan Note (Signed)
Well-controlled on Jardiance 10 mg and Wellbutrin Point-of-care A1c done today is 6.7 last was 6.0 He has continued to have it be well-controlled without any concerning high or low blood sugar episodes no medication side effects or concerns continue Jardiance and metformin

## 2022-10-03 NOTE — Progress Notes (Signed)
Name: Lawrence Holmes   MRN: [id number]    DOB: 06-27-1956   Date:10/03/2022       Progress Note  Chief Complaint  Patient presents with   Follow-up   Hyperlipidemia   Diabetes     Subjective:   Lawrence Holmes is a 67 y.o. male, presents to clinic for routine f/up  DM has been well controlled over the past 2 OV Last A1C was 6.0 On metformin and Jardiance here for recheck of A1c a little early per his preference He is on meds for renal protection, statin, diabetic eye exam foot exam and urine microalbumin testing is all up-to-date as of October  Hypertension:  Currently managed on carvedilol, spironolactone, Entresto per cardiology for cardiomyopathy Pt reports good med compliance and denies any SE.   Blood pressure today is well controlled. BP Readings from Last 3 Encounters:  10/03/22 124/68  08/09/22 121/74  06/14/22 113/78   Pt denies CP, SOB, exertional sx, LE edema, palpitation, Ha's, visual disturbances, lightheadedness, hypotension, syncope.  HLD atorvastatin 80 mg daily good compliance lipids were recently checked LDL was in the 80s     Current Outpatient Medications:    ACCU-CHEK GUIDE test strip, SMARTSIG:Via Meter 1-4 Times Daily, Disp: , Rfl:    Accu-Chek Softclix Lancets lancets, SMARTSIG:Via Meter 1-4 Times Daily, Disp: , Rfl:    aspirin 81 MG chewable tablet, Chew 1 tablet (81 mg total) by mouth daily., Disp: 30 tablet, Rfl: 2   atorvastatin (LIPITOR) 80 MG tablet, Take 1 tablet (80 mg total) by mouth at bedtime., Disp: 90 tablet, Rfl: 2   Blood Gluc Meter Disp-Strips (BLOOD GLUCOSE METER DISPOSABLE) DEVI, Check fingerstick blood sugars once a day; E11.69, LON 99 months, Disp: 100 each, Rfl: 1   blood glucose meter kit and supplies KIT, Dispense based on patient and insurance preference. Use up to four times daily as directed. (FOR ICD-9 250.00, 250.01)., Disp: 1 each, Rfl: 0   carvedilol (COREG) 3.125 MG tablet, Take 1 tablet (3.125 mg total) by mouth 2 (two)  times daily with a meal., Disp: 180 tablet, Rfl: 2   metFORMIN (GLUCOPHAGE) 500 MG tablet, Take 1 tablet (500 mg total) by mouth 2 (two) times daily with a meal., Disp: 180 tablet, Rfl: 3   sildenafil (REVATIO) 20 MG tablet, Take 1-5 tablets (20-100 mg total) by mouth daily as needed (use 30 min prior to sexual activity)., Disp: 15 tablet, Rfl: 1   spironolactone (ALDACTONE) 25 MG tablet, Take 1 tablet (25 mg total) by mouth daily. Please schedule office visit for further refills. Thank you!, Disp: 90 tablet, Rfl: 0   tamsulosin (FLOMAX) 0.4 MG CAPS capsule, TAKE 1 CAPSULE(0.4 MG) BY MOUTH DAILY, Disp: 90 capsule, Rfl: 3   empagliflozin (JARDIANCE) 10 MG TABS tablet, Take 1 tablet (10 mg total) by mouth daily before breakfast., Disp: 90 tablet, Rfl: 1   sacubitril-valsartan (ENTRESTO) 24-26 MG, Take 1 tablet by mouth 2 (two) times daily. Please call [phone] to schedule an appointment for yearly follow up and for future refills. Thank you., Disp: 60 tablet, Rfl: 1  Patient Active Problem List   Diagnosis Date Noted   Abnormal prostate exam 05/09/2022   Aortic atherosclerosis ([place]) 02/24/2022   Smokes with greater than 30 pack year history 11/18/2021   Coronary artery disease of native artery of native heart with stable angina pectoris ([place]) 05/19/2021   Dyslipidemia 07/30/2020   Polyp of colon    Type 2 diabetes mellitus, controlled ([place]) 02/08/2018  History of myocardial infarction 11/08/2017   Tobacco use 11/08/2017   Ischemic cardiomyopathy 11/08/2017   CHF (congestive heart failure) ([place]) 11/08/2017   Foot callus 11/08/2017   Onychomycosis of multiple toenails with type 2 diabetes mellitus ([place]) 11/08/2017   Cataract, nuclear sclerotic, left eye 04/04/2016    Past Surgical History:  Procedure Laterality Date   CATARACT EXTRACTION Bilateral    COLONOSCOPY WITH PROPOFOL N/A 04/04/2019   Procedure: COLONOSCOPY WITH PROPOFOL;  Surgeon: Virgel Manifold, MD;  Location: [facility]  ENDOSCOPY;  Service: Endoscopy;  Laterality: N/A;   LEFT HEART CATH AND CORONARY ANGIOGRAPHY N/A 07/21/2017   Procedure: LEFT HEART CATH AND CORONARY ANGIOGRAPHY;  Surgeon: Wellington Hampshire, MD;  Location: [facility];  Service: Cardiovascular;  Laterality: N/A;   XI ROBOTIC ASSISTED INGUINAL HERNIA REPAIR WITH MESH Left 05/19/2022   Procedure: XI ROBOTIC ASSISTED INGUINAL HERNIA REPAIR WITH MESH;  Surgeon: Ronny Bacon, MD;  Location: [facility] ORS;  Service: General;  Laterality: Left;    Family History  Problem Relation Age of Onset   Heart disease Mother    Hyperlipidemia Mother    Hypertension Mother    Heart attack Sister     Social History   Tobacco Use   Smoking status: Every Day    Packs/day: 1.00    Years: 44.00    Total pack years: 44.00    Types: Cigarettes    Start date: 08/21/1974    Passive exposure: Current   Smokeless tobacco: Never   Tobacco comments:    Started smoking around 67 y/o has stopped several times  Vaping Use   Vaping Use: Never used  Substance Use Topics   Alcohol use: Yes    Comment: rare   Drug use: No     No Known Allergies  Health Maintenance  Topic Date Due   DTaP/Tdap/Td (1 - Tdap) Never done   COVID-19 Vaccine (4 - 2023-24 season) 10/19/2022 (Originally 04/21/2022)   INFLUENZA VACCINE  11/19/2022 (Originally 03/21/2022)   Zoster Vaccines- Shingrix (1 of 2) 01/01/2023 (Originally 12/26/2004)   OPHTHALMOLOGY EXAM  12/08/2022   Lung Cancer Screening  01/19/2023   HEMOGLOBIN A1C  04/03/2023   Diabetic kidney evaluation - eGFR measurement  05/27/2023   Diabetic kidney evaluation - Urine ACR  05/27/2023   FOOT EXAM  05/27/2023   COLONOSCOPY (Pts 45-66yr Insurance coverage will need to be confirmed)  04/03/2024   Pneumonia Vaccine 67 Years old  Completed   Hepatitis C Screening  Completed   HPV VACCINES  Aged Out    Chart Review Today: I personally reviewed active problem list, medication list, allergies, family history, social  history, health maintenance, notes from last encounter, lab results, imaging with the patient/caregiver today.   Review of Systems  Constitutional: Negative.   HENT: Negative.    Eyes: Negative.   Respiratory: Negative.    Cardiovascular: Negative.   Gastrointestinal: Negative.   Endocrine: Negative.   Genitourinary: Negative.   Musculoskeletal: Negative.   Skin: Negative.   Allergic/Immunologic: Negative.   Neurological: Negative.   Hematological: Negative.   Psychiatric/Behavioral: Negative.    All other systems reviewed and are negative.    Objective:   Vitals:   10/03/22 1513  BP: 124/68  Pulse: 97  Resp: 16  Temp: (!) 97.5 F (36.4 C)  TempSrc: Oral  SpO2: 96%  Weight: 213 lb 12.8 oz (97 kg)  Height: 6' (1.829 m)    Body mass index is 29 kg/m.  Physical Exam Vitals and  nursing note reviewed.  Constitutional:      General: He is not in acute distress.    Appearance: Normal appearance. He is well-developed. He is not ill-appearing, toxic-appearing or diaphoretic.  HENT:     Head: Normocephalic and atraumatic.     Nose: Nose normal.  Eyes:     General:        Right eye: No discharge.        Left eye: No discharge.     Conjunctiva/sclera: Conjunctivae normal.  Neck:     Trachea: No tracheal deviation.  Cardiovascular:     Rate and Rhythm: Normal rate and regular rhythm.  Pulmonary:     Effort: Pulmonary effort is normal. No respiratory distress.     Breath sounds: Normal breath sounds. No stridor.  Musculoskeletal:        General: Normal range of motion.  Skin:    General: Skin is warm and dry.     Findings: No rash.  Neurological:     Mental Status: He is alert.     Motor: No abnormal muscle tone.     Coordination: Coordination normal.  Psychiatric:        Behavior: Behavior normal.         Assessment & Plan:   Problem List Items Addressed This Visit       Endocrine   Type 2 diabetes mellitus, controlled ([place]) - Primary     Well-controlled on Jardiance 10 mg and Wellbutrin Point-of-care A1c done today is 6.7 last was 6.0 He has continued to have it be well-controlled without any concerning high or low blood sugar episodes no medication side effects or concerns continue Jardiance and metformin      Relevant Medications   empagliflozin (JARDIANCE) 10 MG TABS tablet   Other Relevant Orders   POCT glycosylated hemoglobin (Hb A1C) (Completed)     Other   Dyslipidemia    Compliant with meds, no SE, no myalgias, fatigue or jaundice Due for FLP and recheck CMP Diet and exercise recommendations for HLD reviewed and handout given Continue lipitor 80 - per cardiology LDL goal?  May be to have below 70  Recommend diet efforts and we will recheck at his next appt        Return in about 6 months (around 04/03/2023) for DM HTN HLD f/up with labs.   Delsa Grana, PA-C 10/03/22 4:21 PM

## 2022-10-04 ENCOUNTER — Telehealth: Payer: Self-pay | Admitting: Cardiovascular Disease

## 2022-10-04 NOTE — Telephone Encounter (Signed)
Patient called and said that the pharmacy needs to talk with Dr. Fletcher Anon or nurse in regards to patient's medication sacubitril-valsartan (ENTRESTO) 24-26 MG. The pharmacy is [facility] [id number] - [address], [place]

## 2022-10-04 NOTE — Telephone Encounter (Signed)
Call placed to the pharmacy. The Entresto needs a prior authorization.

## 2022-10-04 NOTE — Telephone Encounter (Signed)
PA started through Covermymeds  Lawrence Holmes (Key: [id number])  [employer] is reviewing your PA request. Typically an electronic response will be received within 24-72 hours. To check for an update later, open this request from your dashboard.  You may close this dialog and return to your dashboard to perform other tasks.

## 2022-10-26 ENCOUNTER — Ambulatory Visit: Payer: 59 | Admitting: Family Medicine

## 2022-11-28 ENCOUNTER — Other Ambulatory Visit: Payer: Self-pay | Admitting: Medical

## 2022-12-21 ENCOUNTER — Telehealth: Payer: Self-pay | Admitting: Cardiovascular Disease

## 2022-12-21 NOTE — Telephone Encounter (Signed)
Please schedule overdue office visit for refills. Thank you! 

## 2022-12-21 NOTE — Telephone Encounter (Signed)
*  STAT* If patient is at the pharmacy, call can be transferred to refill team.   1. Which medications need to be refilled? (please list name of each medication and dose if known) Entresto  2. Which pharmacy/location (including street and city if local pharmacy) is medication to be sent to? [facility] [address], [place]  3. Do they need a 30 day or 90 day supply? 90 days and refills

## 2022-12-22 MED ORDER — ENTRESTO 24-26 MG PO TABS: 1.0000 | ORAL_TABLET | Freq: Two times a day (BID) | ORAL | 0 refills | Status: DC

## 2022-12-22 NOTE — Telephone Encounter (Signed)
Requested Prescriptions   Signed Prescriptions Disp Refills   sacubitril-valsartan (ENTRESTO) 24-26 MG 60 tablet 0    Sig: Take 1 tablet by mouth 2 (two) times daily.    Authorizing Provider: ARIDA, MUHAMMAD A    Ordering User: NEWCOMER MCCLAIN, Lashya Passe L    

## 2022-12-29 ENCOUNTER — Encounter: Payer: Self-pay | Admitting: Medical

## 2022-12-29 ENCOUNTER — Ambulatory Visit: Payer: 59

## 2022-12-29 ENCOUNTER — Ambulatory Visit: Payer: 59 | Attending: Medical | Admitting: Medical

## 2022-12-29 VITALS — BP 120/70 | HR 78 | Ht 72.0 in | Wt 209.8 lb

## 2022-12-29 DIAGNOSIS — I340 Nonrheumatic mitral (valve) insufficiency: Secondary | ICD-10-CM

## 2022-12-29 DIAGNOSIS — I255 Ischemic cardiomyopathy: Secondary | ICD-10-CM | POA: Diagnosis not present

## 2022-12-29 DIAGNOSIS — I493 Ventricular premature depolarization: Secondary | ICD-10-CM

## 2022-12-29 DIAGNOSIS — I5022 Chronic systolic (congestive) heart failure: Secondary | ICD-10-CM

## 2022-12-29 DIAGNOSIS — I2510 Atherosclerotic heart disease of native coronary artery without angina pectoris: Secondary | ICD-10-CM

## 2022-12-29 DIAGNOSIS — E782 Mixed hyperlipidemia: Secondary | ICD-10-CM

## 2022-12-29 MED ORDER — CARVEDILOL 6.25 MG PO TABS: 6.2500 mg | ORAL_TABLET | Freq: Two times a day (BID) | ORAL | 3 refills | Status: DC

## 2022-12-29 NOTE — Patient Instructions (Signed)
Medication Instructions:  Your physician recommends the following medication changes.  INCREASE: Carvedilol to 6.25 mg by mouth twice a day    *If you need a refill on your cardiac medications before your next appointment, please call your pharmacy*   Lab Work: None ordered today   Testing/Procedures: Your physician has recommended that you wear a 14 day Zio monitor.   This monitor is a medical device that records the heart's electrical activity. Doctors most often use these monitors to diagnose arrhythmias. Arrhythmias are problems with the speed or rhythm of the heartbeat. The monitor is a small device applied to your chest. You can wear one while you do your normal daily activities. While wearing this monitor if you have any symptoms to push the button and record what you felt. Once you have worn this monitor for the period of time provider prescribed (Usually 14 days), you will return the monitor device in the postage paid box. Once it is returned they will download the data collected and provide [place] with a report which the provider will then review and we will call you with those results. Important tips:  Avoid showering during the first 24 hours of wearing the monitor. Avoid excessive sweating to help maximize wear time. Do not submerge the device, no hot tubs, and no swimming pools. Keep any lotions or oils away from the patch. After 24 hours you may shower with the patch on. Take brief showers with your back facing the shower head.  Do not remove patch once it has been placed because that will interrupt data and decrease adhesive wear time. Push the button when you have any symptoms and write down what you were feeling. Once you have completed wearing your monitor, remove and place into box which has postage paid and place in your outgoing mailbox.  If for some reason you have misplaced your box then call our office and we can provide another box and/or mail it off for you.       Follow-Up: At [facility], you and your health needs are our priority.  As part of our continuing mission to provide you with exceptional heart care, we have created designated Provider Care Teams.  These Care Teams include your primary Cardiologist (physician) and Advanced Practice Providers (APPs -  Physician Assistants and Nurse Practitioners) who all work together to provide you with the care you need, when you need it.  We recommend signing up for the patient portal called "MyChart".  Sign up information is provided on this After Visit Summary.  MyChart is used to connect with patients for Virtual Visits (Telemedicine).  Patients are able to view lab/test results, encounter notes, upcoming appointments, etc.  Non-urgent messages can be sent to your provider as well.   To learn more about what you can do with MyChart, go to [email or web address].    Your next appointment:   6-8 week(s)  Provider:   You may see Lorine Bears, MD or one of the following Advanced Practice Providers on your designated Care Team:   Nicolasa Ducking, NP Eula Listen, PA-C Cadence Fransico Michael, PA-C Charlsie Quest, NP

## 2022-12-29 NOTE — Progress Notes (Signed)
Cardiology Office Note:    Date:  12/29/2022   ID:  Lawrence Holmes [place], DOB 09-21-1955, MRN [id number]  PCP:  Danelle Berry, PA-C  [facility] Cardiologist:  Lorine Bears, MD  [facility] Electrophysiologist:  None   Referring MD: Danelle Berry, PA-C   Chief Complaint: 6 month follow-up  History of Present Illness:    Lawrence Holmes [place] is a 67 y.o. male with a hx of CAD, HFrEF, ICM, MR, HTN and HLD who presents for 6 month follow-up.    The patient presented in December 2018 with Inferior STEMI with late presentation. Cardiac catheterization showed severe 2V CAD with total occlusion of the proximal RCA with L>R collaterals and occluded distal left circumflex with collaterals. No obstructive disease affecting the LAD. The culprit was felt to the the RCA. EF was found to be 25-30% with inferior wall akinesis. Wire was unable to cross so no intervention was performed. Echo showed LVEF 30-35%. The patient was treated medically. Repeat echo in March 2019 showed improvement of EF 40-45% with akinesis of inferior and inferoseptal myocardium. He had ETT 2021 which showed no evidence of ischemia.   Patient was last seen September 2023 via telehealth for preop clearance.  No further workup was required.  Today, the patient is overall doing well. He denies chest pain or shortness of breath. No lower leg edema, orthopnea or pnd. No lightheadedness or dizziness, or palpitations. Says blood work done by PCP. EKG has NSR with frequent PVCs.   Past Medical History:  Diagnosis Date   Aortic atherosclerosis (HCC)    Arthritis    Chronic systolic heart failure (HCC) 07/23/2017   a.) TTE 07/23/2017: EF 30-35%, mild LVH, inferolateral and inferior AK, mild BAE, G1DD; b.) TTE 10/23/2017: EF 40-45%, mild LVH, basal-mid inferior and inferoseptal AK, mild MR; c.) TTE 11/19/2021: EF 45-50%, glob HK with mod to severe inferior and basal to mid septal HK, GLS -15.3%, mild MR, G1DD.   Coronary artery disease 07/21/2017    a. 07/2017 Late presenting inferior STEMI/Cath: LM nl, LAD min irregs, LCX 100d (L->L collats), RCA 100p (L->R collats), EF 25-35%-->Med Rx; b. 10/2017 ETT (DOT): Ex time 7:38, baseline antlat TWI, no acute changes.    DDD (degenerative disc disease), lumbar    Diverticulosis of colon    Diverticulosis of large intestine without diverticulitis    Enlarged prostate    Erectile dysfunction    a.) on PDE5i (sildenafil) PRN   HTN (hypertension)    Hx of bilateral cataract extraction    Hyperlipidemia    Ischemic cardiomyopathy    a.) LHC 07/21/2017: EF 25-35%; b.) TTE 07/23/2017: 30-35%; c.) TTE 10/23/2017: EF 40-45%; Holmes.) TTE 11/19/2021: EF 45-50%   Left inguinal hernia    Left inguinal hernia 05/09/2022   ST elevation myocardial infarction (STEMI) of inferior wall (HCC) 07/21/2017   a.) LHC 07/21/2017: EF 25-35%, LVEDP 14 mmHg. 40% pLCx, 40% mLCx, 100% dLCx, 100% pRCA --> attempted to cross wire at RCA lesion, however unable. Reasonable RCA collaterals already formed --> med mgmt.   T2DM (type 2 diabetes mellitus) (HCC)    Tobacco use     Past Surgical History:  Procedure Laterality Date   CATARACT EXTRACTION Bilateral    COLONOSCOPY WITH PROPOFOL N/A 04/04/2019   Procedure: COLONOSCOPY WITH PROPOFOL;  Surgeon: Pasty Spillers, MD;  Location: [facility] ENDOSCOPY;  Service: Endoscopy;  Laterality: N/A;   LEFT HEART CATH AND CORONARY ANGIOGRAPHY N/A 07/21/2017   Procedure: LEFT HEART CATH AND CORONARY ANGIOGRAPHY;  Surgeon: Iran Ouch, MD;  Location: [facility] INVASIVE CV LAB;  Service: Cardiovascular;  Laterality: N/A;   XI ROBOTIC ASSISTED INGUINAL HERNIA REPAIR WITH MESH Left 05/19/2022   Procedure: XI ROBOTIC ASSISTED INGUINAL HERNIA REPAIR WITH MESH;  Surgeon: Campbell Lerner, MD;  Location: [facility] ORS;  Service: General;  Laterality: Left;    Current Medications: Current Meds  Medication Sig   ACCU-CHEK GUIDE test strip SMARTSIG:Via Meter 1-4 Times Daily   Accu-Chek Softclix  Lancets lancets SMARTSIG:Via Meter 1-4 Times Daily   aspirin 81 MG chewable tablet Chew 1 tablet (81 mg total) by mouth daily.   atorvastatin (LIPITOR) 80 MG tablet Take 1 tablet (80 mg total) by mouth at bedtime.   Blood Gluc Meter Disp-Strips (BLOOD GLUCOSE METER DISPOSABLE) DEVI Check fingerstick blood sugars once a day; E11.69, LON 99 months   blood glucose meter kit and supplies KIT Dispense based on patient and insurance preference. Use up to four times daily as directed. (FOR ICD-9 250.00, 250.01).   empagliflozin (JARDIANCE) 10 MG TABS tablet Take 1 tablet (10 mg total) by mouth daily before breakfast.   metFORMIN (GLUCOPHAGE) 500 MG tablet Take 1 tablet (500 mg total) by mouth 2 (two) times daily with a meal.   sacubitril-valsartan (ENTRESTO) 24-26 MG Take 1 tablet by mouth 2 (two) times daily.   sildenafil (REVATIO) 20 MG tablet Take 1-5 tablets (20-100 mg total) by mouth daily as needed (use 30 min prior to sexual activity).   spironolactone (ALDACTONE) 25 MG tablet TAKE 1 TABLET(25 MG) BY MOUTH DAILY   tamsulosin (FLOMAX) 0.4 MG CAPS capsule TAKE 1 CAPSULE(0.4 MG) BY MOUTH DAILY   [DISCONTINUED] carvedilol (COREG) 3.125 MG tablet Take 1 tablet (3.125 mg total) by mouth 2 (two) times daily with a meal.     Allergies:   Patient has no known allergies.   Social History   Socioeconomic History   Marital status: Married    Spouse name: Not on file   Number of children: Not on file   Years of education: Not on file   Highest education level: Not on file  Occupational History   Not on file  Tobacco Use   Smoking status: Every Day    Packs/day: 1.00    Years: 44.00    Additional pack years: 0.00    Total pack years: 44.00    Types: Cigarettes    Start date: 08/21/1974    Passive exposure: Current   Smokeless tobacco: Never   Tobacco comments:    Started smoking around 67 y/o has stopped several times  Vaping Use   Vaping Use: Never used  Substance and Sexual Activity    Alcohol use: Yes    Comment: rare   Drug use: No   Sexual activity: Yes  Other Topics Concern   Not on file  Social History Narrative   Not on file   Social Determinants of Health   Financial Resource Strain: Not on file  Food Insecurity: Not on file  Transportation Needs: Not on file  Physical Activity: Not on file  Stress: Not on file  Social Connections: Not on file     Family History: The patient's family history includes Heart attack in his sister; Heart disease in his mother; Hyperlipidemia in his mother; Hypertension in his mother.  ROS:   Please see the history of present illness.     All other systems reviewed and are negative.  EKGs/Labs/Other Studies Reviewed:    The following studies were reviewed today:  Echo 11/2021 1. Left ventricular ejection fraction, by estimation, is 45 to 50%. The  left ventricle has mildly decreased function. The left ventricle  demonstrates mild global hypokinesis with moderate to severe hypokinesis  of the inferior and basal to mid septal  wall. Left ventricular diastolic parameters are consistent with Grade I  diastolic dysfunction (impaired relaxation). The average left ventricular  global longitudinal strain is -15.3 %.   2. Right ventricular systolic function is normal. The right ventricular  size is normal. Tricuspid regurgitation signal is inadequate for assessing  PA pressure.   3. The mitral valve is normal in structure. Mild mitral valve  regurgitation. No evidence of mitral stenosis.   4. The aortic valve is tricuspid. Aortic valve regurgitation is not  visualized. No aortic stenosis is present.   5. The inferior vena cava is normal in size with greater than 50%  respiratory variability, suggesting right atrial pressure of 3 mmHg.   Comparison(s): LVEF 40-45%.   ETT 10/2019 Blood pressure demonstrated a normal response to exercise. There was no ST segment deviation noted during stress. No T wave inversion was noted  during stress.   Normal treadmill stress test with no evidence of ischemia.  Few PVCs in recovery. Average exercise capacity with exercise time of 6 minutes and 49 seconds achieving 87% maximum predicted heart rate and 8.2 METS. Normal blood pressure at baseline with mild hypertensive response to exercise.  EKG:  EKG is ordered today.  The ekg ordered today demonstrates NSR 78bpm, frequent PVCs, TWI aVF, no changes  Recent Labs: 05/07/2022: Hemoglobin 17.0; Platelets 292 05/26/2022: ALT 27; BUN 17; Creat 1.25; Potassium 4.3; Sodium 141  Recent Lipid Panel    Component Value Date/Time   CHOL 135 05/26/2022 0936   TRIG 176 (H) 05/26/2022 0936   HDL 24 (L) 05/26/2022 0936   CHOLHDL 5.6 (H) 05/26/2022 0936   VLDL 13 09/12/2017 1559   LDLCALC 84 05/26/2022 0936     Physical Exam:    VS:  BP 120/70 (BP Location: Left Arm, Patient Position: Sitting, Cuff Size: Normal)   Pulse 78   Ht 6' (1.829 m)   Wt 209 lb 12.8 oz (95.2 kg)   SpO2 95%   BMI 28.45 kg/m     Wt Readings from Last 3 Encounters:  12/29/22 209 lb 12.8 oz (95.2 kg)  10/03/22 213 lb 12.8 oz (97 kg)  08/09/22 210 lb (95.3 kg)     GEN:  Well nourished, well developed in no acute distress HEENT: Normal NECK: No JVD; No carotid bruits LYMPHATICS: No lymphadenopathy CARDIAC: RRR, no murmurs, rubs, gallops RESPIRATORY:  Clear to auscultation without rales, wheezing or rhonchi  ABDOMEN: Soft, non-tender, non-distended MUSCULOSKELETAL:  No edema; No deformity  SKIN: Warm and dry NEUROLOGIC:  Alert and oriented x 3 PSYCHIATRIC:  Normal affect   ASSESSMENT:    1. PVC (premature ventricular contraction)   2. Coronary artery disease involving native coronary artery of native heart without angina pectoris   3. Chronic systolic heart failure (HCC)   4. Ischemic cardiomyopathy   5. Mitral valve insufficiency, unspecified etiology   6. Hyperlipidemia, mixed    PLAN:    In order of problems listed above:  PVCs EKG  today shows normal sinus rhythm with PVCs, which appears to be a new finding.  Patient denies any palpitations.  He is on Coreg 3.125 mg twice daily.  He reports he had blood work from his PCP, we will request labs from them.  I  will order a 2-week heart monitor.  I will increase Coreg to 6.25 mg twice daily.  Will see him back after the heart monitor.  CAD The patient denies any anginal symptoms.  Cath in 2018 showed severe two-vessel CAD, unable to perform angioplasty due to inability to cross the wire.  ETT in 2021 showed no evidence of ischemia.  Will continue medical management with aspirin, statin, and beta-blocker therapy.  HFrEF ICM Most recent echocardiogram showed improved LVEF of 45 to 50%, severe hypokinesis of the inferior basal to mid septal wall, grade 1 diastolic dysfunction, normal RV SF, mild MR. The patient is euvolemic on exam. Continue Coreg, Jardiance, Entresto, and spironolactone.   Mild MR Echo in April 2023 showed mild MR.  HLD LDL 84 in 2023.  We will request updated blood work.  Continue Lipitor 80 mg daily.  Disposition: Follow up in 6-8  week(s) with MD/APP    Signed, Justice Aguirre David Stall, PA-C  12/29/2022 3:20 PM    [facility]

## 2023-01-02 ENCOUNTER — Other Ambulatory Visit: Payer: Self-pay

## 2023-01-02 MED ORDER — ATORVASTATIN CALCIUM 80 MG PO TABS: 80.0000 mg | ORAL_TABLET | Freq: Every day | ORAL | 0 refills | Status: DC

## 2023-01-06 ENCOUNTER — Other Ambulatory Visit: Payer: Self-pay | Admitting: Acute Care

## 2023-01-06 DIAGNOSIS — Z87891 Personal history of nicotine dependence: Secondary | ICD-10-CM

## 2023-01-06 DIAGNOSIS — Z122 Encounter for screening for malignant neoplasm of respiratory organs: Secondary | ICD-10-CM

## 2023-01-06 DIAGNOSIS — F17210 Nicotine dependence, cigarettes, uncomplicated: Secondary | ICD-10-CM

## 2023-01-22 ENCOUNTER — Ambulatory Visit
Admission: RE | Admit: 2023-01-22 | Discharge: 2023-01-22 | Disposition: A | Discharge status: Home / Self Care | Payer: 59 | Source: Ambulatory Visit | Attending: Family Medicine | Admitting: Family Medicine

## 2023-01-22 DIAGNOSIS — F17210 Nicotine dependence, cigarettes, uncomplicated: Secondary | ICD-10-CM

## 2023-01-22 DIAGNOSIS — Z87891 Personal history of nicotine dependence: Secondary | ICD-10-CM

## 2023-01-22 DIAGNOSIS — Z122 Encounter for screening for malignant neoplasm of respiratory organs: Secondary | ICD-10-CM

## 2023-01-25 ENCOUNTER — Other Ambulatory Visit: Payer: Self-pay | Admitting: Cardiovascular Disease

## 2023-01-29 ENCOUNTER — Telehealth: Payer: Self-pay | Admitting: Medical

## 2023-01-29 NOTE — Telephone Encounter (Signed)
Email notification received from [employer] stating the patient's monitor is still pending return.  [employer] Delivery Date: 12/29/22 Serial #: [id number]

## 2023-01-29 NOTE — Telephone Encounter (Signed)
Called pt regarding monitor. Pt stated he hasn't completed wearing monitor and will place it tonight.

## 2023-02-23 ENCOUNTER — Ambulatory Visit: Payer: 59 | Admitting: Medical

## 2023-02-26 ENCOUNTER — Other Ambulatory Visit: Payer: Self-pay | Admitting: Cardiovascular Disease

## 2023-03-09 ENCOUNTER — Telehealth: Payer: Self-pay | Admitting: Acute Care

## 2023-03-09 DIAGNOSIS — R911 Solitary pulmonary nodule: Secondary | ICD-10-CM

## 2023-03-09 DIAGNOSIS — Z87891 Personal history of nicotine dependence: Secondary | ICD-10-CM

## 2023-03-09 NOTE — Telephone Encounter (Signed)
This guy's scan  was read as a LR 2. This is his second scan and he has a new gg area that is 12 mm. When you have a minute, if you could eyeball this scan. No hurry. We can look at it Monday. Thanks

## 2023-03-12 ENCOUNTER — Telehealth: Payer: Self-pay | Admitting: Acute Care

## 2023-03-12 NOTE — Telephone Encounter (Signed)
PT ret Denise's call. Please call back. TY.

## 2023-03-12 NOTE — Telephone Encounter (Signed)
Left message for pt to call back regarding lung screening CT results.

## 2023-03-13 NOTE — Telephone Encounter (Signed)
Spoke with pt and advised of lung screening CT results. Pt advised that a new nodule area was seen that appears to be inflammatory but Dr Jayme Cloud has recommended a repeat scan in 6 months. Pt verbalized understanding and is aware that we will repeat CT in 6 months. Order placed for 6 month nodule f/u CT.

## 2023-03-13 NOTE — Telephone Encounter (Signed)
See telephone note from 03/09/23

## 2023-03-19 ENCOUNTER — Ambulatory Visit
Admission: EM | Admit: 2023-03-19 | Discharge: 2023-03-19 | Disposition: A | Discharge status: Home / Self Care | Payer: 59

## 2023-03-19 DIAGNOSIS — M5442 Lumbago with sciatica, left side: Secondary | ICD-10-CM | POA: Diagnosis not present

## 2023-03-19 NOTE — ED Provider Notes (Signed)
Renaldo Fiddler    CSN: [id number] Arrival date & time: 03/19/23  1136      History   Chief Complaint Chief Complaint  Patient presents with   Back Pain    HPI Wylder D [place] is a 67 y.o. male.    Back Pain  Presents urgent care with complaint of lower back pain x 3 days with radiating pain into feet.  He denies any known injury or precipitating event.  Reports pain is present with moving and walking.  Symptoms relieved by rest.  Negative buttock or lateral thigh pain.  Past medical history is significant for DM2 recently well-controlled.  Past Medical History:  Diagnosis Date   Aortic atherosclerosis (HCC)    Arthritis    Chronic systolic heart failure (HCC) 07/23/2017   a.) TTE 07/23/2017: EF 30-35%, mild LVH, inferolateral and inferior AK, mild BAE, G1DD; b.) TTE 10/23/2017: EF 40-45%, mild LVH, basal-mid inferior and inferoseptal AK, mild MR; c.) TTE 11/19/2021: EF 45-50%, glob HK with mod to severe inferior and basal to mid septal HK, GLS -15.3%, mild MR, G1DD.   Coronary artery disease 07/21/2017   a. 07/2017 Late presenting inferior STEMI/Cath: LM nl, LAD min irregs, LCX 100d (L->L collats), RCA 100p (L->R collats), EF 25-35%-->Med Rx; b. 10/2017 ETT (DOT): Ex time 7:38, baseline antlat TWI, no acute changes.    DDD (degenerative disc disease), lumbar    Diverticulosis of colon    Diverticulosis of large intestine without diverticulitis    Enlarged prostate    Erectile dysfunction    a.) on PDE5i (sildenafil) PRN   HTN (hypertension)    Hx of bilateral cataract extraction    Hyperlipidemia    Ischemic cardiomyopathy    a.) LHC 07/21/2017: EF 25-35%; b.) TTE 07/23/2017: 30-35%; c.) TTE 10/23/2017: EF 40-45%; d.) TTE 11/19/2021: EF 45-50%   Left inguinal hernia    Left inguinal hernia 05/09/2022   ST elevation myocardial infarction (STEMI) of inferior wall (HCC) 07/21/2017   a.) LHC 07/21/2017: EF 25-35%, LVEDP 14 mmHg. 40% pLCx, 40% mLCx, 100% dLCx, 100%  pRCA --> attempted to cross wire at RCA lesion, however unable. Reasonable RCA collaterals already formed --> med mgmt.   T2DM (type 2 diabetes mellitus) (HCC)    Tobacco use     Patient Active Problem List   Diagnosis Date Noted   Abnormal prostate exam 05/09/2022   Aortic atherosclerosis (HCC) 02/24/2022   Smokes with greater than 30 pack year history 11/18/2021   Coronary artery disease of native artery of native heart with stable angina pectoris (HCC) 05/19/2021   Dyslipidemia 07/30/2020   Polyp of colon    Type 2 diabetes mellitus, controlled (HCC) 02/08/2018   History of myocardial infarction 11/08/2017   Tobacco use 11/08/2017   Ischemic cardiomyopathy 11/08/2017   CHF (congestive heart failure) (HCC) 11/08/2017   Foot callus 11/08/2017   Onychomycosis of multiple toenails with type 2 diabetes mellitus (HCC) 11/08/2017   Cataract, nuclear sclerotic, left eye 04/04/2016    Past Surgical History:  Procedure Laterality Date   CATARACT EXTRACTION Bilateral    COLONOSCOPY WITH PROPOFOL N/A 04/04/2019   Procedure: COLONOSCOPY WITH PROPOFOL;  Surgeon: Pasty Spillers, MD;  Location: [facility] ENDOSCOPY;  Service: Endoscopy;  Laterality: N/A;   LEFT HEART CATH AND CORONARY ANGIOGRAPHY N/A 07/21/2017   Procedure: LEFT HEART CATH AND CORONARY ANGIOGRAPHY;  Surgeon: Iran Ouch, MD;  Location: [facility] INVASIVE CV LAB;  Service: Cardiovascular;  Laterality: N/A;   XI ROBOTIC ASSISTED INGUINAL  HERNIA REPAIR WITH MESH Left 05/19/2022   Procedure: XI ROBOTIC ASSISTED INGUINAL HERNIA REPAIR WITH MESH;  Surgeon: Campbell Lerner, MD;  Location: [facility] ORS;  Service: General;  Laterality: Left;       Home Medications    Prior to Admission medications   Medication Sig Start Date End Date Taking? Authorizing Provider  ACCU-CHEK GUIDE test strip SMARTSIG:Via Meter 1-4 Times Daily 11/25/21   [provider]  Accu-Chek Softclix Lancets lancets SMARTSIG:Via Meter 1-4 Times Daily  11/25/21   [provider]  aspirin 81 MG chewable tablet Chew 1 tablet (81 mg total) by mouth daily. 07/24/17   Shaune Pollack, MD  atorvastatin (LIPITOR) 80 MG tablet Take 1 tablet (80 mg total) by mouth at bedtime. 01/02/23   Furth, Cadence H, PA-C  Blood Gluc Meter Disp-Strips (BLOOD GLUCOSE METER DISPOSABLE) DEVI Check fingerstick blood sugars once a day; E11.69, LON 99 months 05/13/18   Kerman Passey, MD  blood glucose meter kit and supplies KIT Dispense based on patient and insurance preference. Use up to four times daily as directed. (FOR ICD-9 250.00, 250.01). 11/23/21   Danelle Berry, PA-C  carvedilol (COREG) 6.25 MG tablet Take 1 tablet (6.25 mg total) by mouth 2 (two) times daily with a meal. 12/29/22   Furth, Cadence H, PA-C  empagliflozin (JARDIANCE) 10 MG TABS tablet Take 1 tablet (10 mg total) by mouth daily before breakfast. 10/03/22   Danelle Berry, PA-C  ENTRESTO 24-26 MG TAKE 1 TABLET BY MOUTH TWICE DAILY 02/26/23   Iran Ouch, MD  metFORMIN (GLUCOPHAGE) 500 MG tablet Take 1 tablet (500 mg total) by mouth 2 (two) times daily with a meal. 02/24/22   Danelle Berry, PA-C  sildenafil (REVATIO) 20 MG tablet Take 1-5 tablets (20-100 mg total) by mouth daily as needed (use 30 min prior to sexual activity). 06/14/22   Sondra Come, MD  spironolactone (ALDACTONE) 25 MG tablet TAKE 1 TABLET(25 MG) BY MOUTH DAILY 11/28/22   Iran Ouch, MD  tamsulosin (FLOMAX) 0.4 MG CAPS capsule TAKE 1 CAPSULE(0.4 MG) BY MOUTH DAILY 08/15/22   Sondra Come, MD    Family History Family History  Problem Relation Age of Onset   Heart disease Mother    Hyperlipidemia Mother    Hypertension Mother    Heart attack Sister     Social History Social History   Tobacco Use   Smoking status: Every Day    Current packs/day: 1.00    Average packs/day: 1 pack/day for 48.6 years (48.6 ttl pk-yrs)    Types: Cigarettes    Start date: 08/21/1974    Passive exposure: Current   Smokeless tobacco: Never    Tobacco comments:    Started smoking around 67 y/o has stopped several times  Vaping Use   Vaping status: Never Used  Substance Use Topics   Alcohol use: Yes    Comment: rare   Drug use: No     Allergies   Patient has no known allergies.   Review of Systems Review of Systems  Musculoskeletal:  Positive for back pain.     Physical Exam Triage Vital Signs ED Triage Vitals  Encounter Vitals Group     BP 03/19/23 1149 122/72     Systolic BP Percentile --      Diastolic BP Percentile --      Pulse Rate 03/19/23 1149 75     Resp 03/19/23 1149 17     Temp 03/19/23 1149 98.5 F (36.9 C)  Temp src --      SpO2 03/19/23 1149 94 %     Weight --      Height --      Head Circumference --      Peak Flow --      Pain Score 03/19/23 1144 5     Pain Loc --      Pain Education --      Exclude from Growth Chart --    No data found.  Updated Vital Signs BP 122/72   Pulse 75   Temp 98.5 F (36.9 C)   Resp 17   SpO2 94%   Visual Acuity Right Eye Distance:   Left Eye Distance:   Bilateral Distance:    Right Eye Near:   Left Eye Near:    Bilateral Near:     Physical Exam Vitals reviewed.  Constitutional:      Appearance: Normal appearance.  Musculoskeletal:     Lumbar back: Tenderness present. No bony tenderness. Decreased range of motion. Negative right straight leg raise test and negative left straight leg raise test.     Comments: Left side tenderness with palpation approximately L4-L5.  Skin:    General: Skin is warm and dry.  Neurological:     General: No focal deficit present.     Mental Status: He is alert and oriented to person, place, and time.     Gait: Gait abnormal.     Comments: Antalgic gait present.  Psychiatric:        Mood and Affect: Mood normal.        Behavior: Behavior normal.      UC Treatments / Results  Labs (all labs ordered are listed, but only abnormal results are displayed) Labs Reviewed - No data to  display  EKG   Radiology No results found.  Procedures Procedures (including critical care time)  Medications Ordered in UC Medications - No data to display  Initial Impression / Assessment and Plan / UC Course  I have reviewed the triage vital signs and the nursing notes.  Pertinent labs & imaging results that were available during my care of the patient were reviewed by me and considered in my medical decision making (see chart for details).   Kosisochukwu D [place] is a 67 y.o. male presenting with L sided lower back pain. Patient is afebrile without recent antipyretics, satting well on room air. Overall is well appearing, well hydrated, without respiratory distress. Pulmonary exam is unremarkable.  Walks with antalgic gait.  Negative SLR bilaterally.  Difficulty moving to supine position due to pain.  Reviewed relevant chart history.   Lack of sciatic pain and history of DM, only recently well-controlled, is suggestive of possible diabetic neuropathy as the cause of his foot pain.  Negative saddle paresthesias or other red flag symptoms.  Likely cause of his symptoms is osteoarthritis and recommending conservative care including cold therapy and careful use of NSAIDs.  Discussed corticosteroids and advised against use given typical side effect of hyperglycemia.  Suggested follow-up with his PCP regarding his foot pain, as well as back pain should it not improve at his scheduled visit in 2 weeks.  Counseled patient on potential for adverse effects with medications prescribed/recommended today, ER and return-to-clinic precautions discussed, patient verbalized understanding and agreement with care plan.  Final Clinical Impressions(s) / UC Diagnoses   Final diagnoses:  None   Discharge Instructions   None    ED Prescriptions   None    PDMP  not reviewed this encounter.   Charma Igo, [place] 03/19/23 1214

## 2023-03-19 NOTE — ED Triage Notes (Addendum)
Patient to Urgent Care with complaints of lower back pain that started three days ago. Pain radiates into left leg.   Denies any known injury. Pain only present with movement and walking. Relieved with rest.

## 2023-03-19 NOTE — Discharge Instructions (Signed)
Recommend cold therapy, rest, and careful use of NSAID medications such as naproxen.  Do not recommend using NSAIDs more than a few days.  Follow-up with your primary care provider regarding your foot pain and should your back pain not improve.

## 2023-03-21 ENCOUNTER — Ambulatory Visit: Payer: 59 | Admitting: Medical

## 2023-03-21 NOTE — Progress Notes (Deleted)
Cardiology Office Note:    Date:  03/21/2023   ID:  [place]u D [place] Holmes, DOB Oct 17, 1955, MRN [id number]  PCP:  Danelle Berry, PA-C  [facility] Cardiologist:  Lorine Bears, MD  [facility] Electrophysiologist:  None   Referring MD: Danelle Berry, PA-C   Chief Complaint: ***  History of Present Illness:    Lawrence Holmes is a 67 y.o. male with a hx of CAD, HFrEF, ICM, MR, HTN and HLD who presents for 6 month follow-up.    The patient presented in December 2018 with Inferior STEMI with late presentation. Cardiac catheterization showed severe 2V CAD with total occlusion of the proximal RCA with L>R collaterals and occluded distal left circumflex with collaterals. No obstructive disease affecting the LAD. The culprit was felt to the the RCA. EF was found to be 25-30% with inferior wall akinesis. Wire was unable to cross so no intervention was performed. Echo showed LVEF 30-35%. The patient was treated medically. Repeat echo in March 2019 showed improvement of EF 40-45% with akinesis of inferior and inferoseptal myocardium. He had ETT 2021 which showed no evidence of ischemia.   Patient was last seen in May 2024 and was overall doing well.  EKG showed frequent PVCs, which was a new finding.  Coreg was increased and a heart monitor was ordered, however this was not completed.   Today,  Past Medical History:  Diagnosis Date   Aortic atherosclerosis (HCC)    Arthritis    Chronic systolic heart failure (HCC) 07/23/2017   a.) TTE 07/23/2017: EF 30-35%, mild LVH, inferolateral and inferior AK, mild BAE, G1DD; b.) TTE 10/23/2017: EF 40-45%, mild LVH, basal-mid inferior and inferoseptal AK, mild MR; c.) TTE 11/19/2021: EF 45-50%, glob HK with mod to severe inferior and basal to mid septal HK, GLS -15.3%, mild MR, G1DD.   Coronary artery disease 07/21/2017   a. 07/2017 Late presenting inferior STEMI/Cath: LM nl, LAD min irregs, LCX 100d (L->L collats), RCA 100p (L->R collats), EF 25-35%-->Med Rx; b.  10/2017 ETT (DOT): Ex time 7:38, baseline antlat TWI, no acute changes.    DDD (degenerative disc disease), lumbar    Diverticulosis of colon    Diverticulosis of large intestine without diverticulitis    Enlarged prostate    Erectile dysfunction    a.) on PDE5i (sildenafil) PRN   HTN (hypertension)    Hx of bilateral cataract extraction    Hyperlipidemia    Ischemic cardiomyopathy    a.) LHC 07/21/2017: EF 25-35%; b.) TTE 07/23/2017: 30-35%; c.) TTE 10/23/2017: EF 40-45%; d.) TTE 11/19/2021: EF 45-50%   Left inguinal hernia    Left inguinal hernia 05/09/2022   ST elevation myocardial infarction (STEMI) of inferior wall (HCC) 07/21/2017   a.) LHC 07/21/2017: EF 25-35%, LVEDP 14 mmHg. 40% pLCx, 40% mLCx, 100% dLCx, 100% pRCA --> attempted to cross wire at RCA lesion, however unable. Reasonable RCA collaterals already formed --> med mgmt.   T2DM (type 2 diabetes mellitus) (HCC)    Tobacco use     Past Surgical History:  Procedure Laterality Date   CATARACT EXTRACTION Bilateral    COLONOSCOPY WITH PROPOFOL N/A 04/04/2019   Procedure: COLONOSCOPY WITH PROPOFOL;  Surgeon: Pasty Spillers, MD;  Location: [facility] ENDOSCOPY;  Service: Endoscopy;  Laterality: N/A;   LEFT HEART CATH AND CORONARY ANGIOGRAPHY N/A 07/21/2017   Procedure: LEFT HEART CATH AND CORONARY ANGIOGRAPHY;  Surgeon: Iran Ouch, MD;  Location: [facility] INVASIVE CV LAB;  Service: Cardiovascular;  Laterality: N/A;   XI  ROBOTIC ASSISTED INGUINAL HERNIA REPAIR WITH MESH Left 05/19/2022   Procedure: XI ROBOTIC ASSISTED INGUINAL HERNIA REPAIR WITH MESH;  Surgeon: Campbell Lerner, MD;  Location: [facility] ORS;  Service: General;  Laterality: Left;    Current Medications: No outpatient medications have been marked as taking for the 03/21/23 encounter (Appointment) with Fransico Michael,  H, PA-C.     Allergies:   Patient has no known allergies.   Social History   Socioeconomic History   Marital status: Married    Spouse name: Not  on file   Number of children: Not on file   Years of education: Not on file   Highest education level: Not on file  Occupational History   Not on file  Tobacco Use   Smoking status: Every Day    Current packs/day: 1.00    Average packs/day: 1 pack/day for 48.6 years (48.6 ttl pk-yrs)    Types: Cigarettes    Start date: 08/21/1974    Passive exposure: Current   Smokeless tobacco: Never   Tobacco comments:    Started smoking around 67 y/o has stopped several times  Vaping Use   Vaping status: Never Used  Substance and Sexual Activity   Alcohol use: Yes    Comment: rare   Drug use: No   Sexual activity: Yes  Other Topics Concern   Not on file  Social History Narrative   Not on file   Social Determinants of Health   Financial Resource Strain: Not on file  Food Insecurity: Not on file  Transportation Needs: Not on file  Physical Activity: Not on file  Stress: Not on file  Social Connections: Not on file     Family History: The patient's ***family history includes Heart attack in his sister; Heart disease in his mother; Hyperlipidemia in his mother; Hypertension in his mother.  ROS:   Please see the history of present illness.    *** All other systems reviewed and are negative.  EKGs/Labs/Other Studies Reviewed:    The following studies were reviewed today: ***  EKG:  EKG is *** ordered today.  The ekg ordered today demonstrates ***  Recent Labs: 05/07/2022: Hemoglobin 17.0; Platelets 292 05/26/2022: ALT 27; BUN 17; Creat 1.25; Potassium 4.3; Sodium 141  Recent Lipid Panel    Component Value Date/Time   CHOL 135 05/26/2022 0936   TRIG 176 (H) 05/26/2022 0936   HDL 24 (L) 05/26/2022 0936   CHOLHDL 5.6 (H) 05/26/2022 0936   VLDL 13 09/12/2017 1559   LDLCALC 84 05/26/2022 0936     Risk Assessment/Calculations:   {Does this patient have ATRIAL FIBRILLATION?[phone]}   Physical Exam:    VS:  There were no vitals taken for this visit.    Wt Readings from  Last 3 Encounters:  12/29/22 209 lb 12.8 oz (95.2 kg)  10/03/22 213 lb 12.8 oz (97 kg)  08/09/22 210 lb (95.3 kg)     GEN: *** Well nourished, well developed in no acute distress HEENT: Normal NECK: No JVD; No carotid bruits LYMPHATICS: No lymphadenopathy CARDIAC: ***RRR, no murmurs, rubs, gallops RESPIRATORY:  Clear to auscultation without rales, wheezing or rhonchi  ABDOMEN: Soft, non-tender, non-distended MUSCULOSKELETAL:  No edema; No deformity  SKIN: Warm and dry NEUROLOGIC:  Alert and oriented x 3 PSYCHIATRIC:  Normal affect   ASSESSMENT:    No diagnosis found. PLAN:    In order of problems listed above:  ***  Disposition: Follow up {follow up:15908} with ***   Shared Decision Making/Informed Consent   {  Are you ordering a CV Procedure (e.g. stress test, cath, DCCV, TEE, etc)?   Press F2        :[id number]}    Signed,  David Stall, PA-C  03/21/2023 8:29 AM    [facility]

## 2023-03-22 ENCOUNTER — Emergency Department: Payer: 59

## 2023-03-22 ENCOUNTER — Other Ambulatory Visit: Payer: Self-pay

## 2023-03-22 ENCOUNTER — Emergency Department
Admission: EM | Admit: 2023-03-22 | Discharge: 2023-03-22 | Disposition: A | Discharge status: Home / Self Care | Payer: 59 | Attending: Emergency Medicine | Admitting: Emergency Medicine

## 2023-03-22 DIAGNOSIS — X58XXXA Exposure to other specified factors, initial encounter: Secondary | ICD-10-CM | POA: Diagnosis not present

## 2023-03-22 DIAGNOSIS — I509 Heart failure, unspecified: Secondary | ICD-10-CM | POA: Diagnosis not present

## 2023-03-22 DIAGNOSIS — M545 Low back pain, unspecified: Secondary | ICD-10-CM | POA: Diagnosis present

## 2023-03-22 DIAGNOSIS — S39012A Strain of muscle, fascia and tendon of lower back, initial encounter: Secondary | ICD-10-CM | POA: Diagnosis not present

## 2023-03-22 DIAGNOSIS — E119 Type 2 diabetes mellitus without complications: Secondary | ICD-10-CM | POA: Diagnosis not present

## 2023-03-22 MED ORDER — TRAMADOL HCL 50 MG PO TABS
50.0000 mg | ORAL_TABLET | Freq: Once | ORAL | Status: AC
  Administered 2023-03-22: 50 mg via ORAL
  Filled 2023-03-22: qty 1

## 2023-03-22 MED ORDER — TRAMADOL HCL 50 MG PO TABS: 50.0000 mg | ORAL_TABLET | Freq: Four times a day (QID) | ORAL | 0 refills | Status: DC | PRN

## 2023-03-22 NOTE — ED Notes (Signed)
See triage note  Presents with some lower back pain  Ambulates well

## 2023-03-22 NOTE — ED Provider Notes (Signed)
[facility] Provider Note    Event Date/Time   First MD Initiated Contact with Patient 03/22/23 1620     (approximate)   History   Back Pain   HPI  Lawrence Holmes [place] is a 67 y.o. male  with history of MI, CHF, Diabetes, and as listed in EMR presents to the emergency department for lower back pain. No known injury.  No relief with Aleve, rest and ice.      Physical Exam   Triage Vital Signs: ED Triage Vitals  Encounter Vitals Group     BP 03/22/23 1428 130/67     Systolic BP Percentile --      Diastolic BP Percentile --      Pulse Rate 03/22/23 1428 80     Resp 03/22/23 1428 18     Temp 03/22/23 1428 98 F (36.7 C)     Temp src --      SpO2 03/22/23 1428 94 %     Weight 03/22/23 1427 215 lb (97.5 kg)     Height 03/22/23 1427 6' (1.829 m)     Head Circumference --      Peak Flow --      Pain Score 03/22/23 1427 0     Pain Loc --      Pain Education --      Exclude from Growth Chart --     Most recent vital signs: Vitals:   03/22/23 1428  BP: 130/67  Pulse: 80  Resp: 18  Temp: 98 F (36.7 C)  SpO2: 94%    General: Awake, no distress.  CV:  Good peripheral perfusion.  Resp:  Normal effort.  Abd:  No distention.  Other:  Nonfocal transverse lumbar tenderness upon position change   ED Results / Procedures / Treatments   Labs (all labs ordered are listed, but only abnormal results are displayed) Labs Reviewed - No data to display   EKG    RADIOLOGY  Image and radiology report reviewed and interpreted by me. Radiology report consistent with the same.  Multilevel spondylosis and facet hypertrophy.  No acute findings on lumbar imaging.  PROCEDURES:  Critical Care performed: No  Procedures   MEDICATIONS ORDERED IN ED:  Medications  traMADol (ULTRAM) tablet 50 mg (50 mg Oral Given 03/22/23 1633)     IMPRESSION / MDM / ASSESSMENT AND PLAN / ED COURSE   I have reviewed the triage note.  Differential diagnosis  includes, but is not limited to, musculoskeletal strain, compression fracture, insufficiency fracture  Patient's presentation is most consistent with acute illness / injury with system symptoms.  67 year old male presenting to the emergency department for treatment and evaluation of nontraumatic lower back pain.  See HPI for further details.  Patient states that it only hurts when he changes positions.  He does not recall any thing that might have triggered his pain.  X-ray of the lumbar spine shows no acute bony abnormality.  While here, he was given tramadol with significant improvement.  Plan will be to give him a prescription for the same.  He is to see the orthopedics or primary care if symptoms are not improving or become recurrent.      FINAL CLINICAL IMPRESSION(S) / ED DIAGNOSES   Final diagnoses:  Strain of lumbar region, initial encounter     Rx / DC Orders   ED Discharge Orders          Ordered    traMADol (ULTRAM) 50 MG tablet  Every 6 hours PRN        03/22/23 1739             Note:  This document was prepared using Dragon voice recognition software and may include unintentional dictation errors.   Chinita Pester, FNP 03/22/23 2014    Merwyn Katos, MD 03/23/23 [phone]

## 2023-03-22 NOTE — ED Triage Notes (Signed)
Pt to ED for lower back pain for a few days. Seen at [facility] on 7/29 for same. Denies injuries. Increased pain with movement

## 2023-03-23 ENCOUNTER — Other Ambulatory Visit: Payer: Self-pay | Admitting: Cardiovascular Disease

## 2023-03-26 ENCOUNTER — Encounter: Payer: Self-pay | Admitting: Family Medicine

## 2023-03-26 ENCOUNTER — Ambulatory Visit
Admission: RE | Admit: 2023-03-26 | Discharge: 2023-03-26 | Disposition: A | Discharge status: Home / Self Care | Payer: 59 | Source: Ambulatory Visit | Attending: Family Medicine | Admitting: Family Medicine

## 2023-03-26 ENCOUNTER — Ambulatory Visit: Payer: 59 | Admitting: Family Medicine

## 2023-03-26 VITALS — BP 112/68 | HR 62 | Resp 16 | Ht 72.0 in | Wt 214.2 lb

## 2023-03-26 DIAGNOSIS — Z7409 Other reduced mobility: Secondary | ICD-10-CM | POA: Diagnosis not present

## 2023-03-26 DIAGNOSIS — M5440 Lumbago with sciatica, unspecified side: Secondary | ICD-10-CM | POA: Diagnosis present

## 2023-03-26 DIAGNOSIS — R29898 Other symptoms and signs involving the musculoskeletal system: Secondary | ICD-10-CM | POA: Insufficient documentation

## 2023-03-26 MED ORDER — BACLOFEN 10 MG PO TABS
5.0000 mg | ORAL_TABLET | Freq: Three times a day (TID) | ORAL | 1 refills | Status: DC | PRN
Start: 2023-03-26 — End: 2023-05-15

## 2023-03-26 MED ORDER — PREDNISONE 20 MG PO TABS
40.0000 mg | ORAL_TABLET | Freq: Every day | ORAL | 0 refills | Status: AC
Start: 2023-03-26 — End: 2023-03-31

## 2023-03-26 NOTE — Progress Notes (Signed)
Patient ID: Lawrence Holmes, male    DOB: Jun 15, 1956, 67 y.o.   MRN: [id number]  PCP: Danelle Berry, PA-C  Chief Complaint  Patient presents with   Back Pain    Lower back about a week, pt unable to walk a lot due to back   Extremity Weakness    Bilateral legs on going for almost a week and has noticed some swelling in his ankles    Subjective:   Lawrence Holmes is a 67 y.o. male, presents to clinic with CC of the following:  HPI  Here for back pain He was seen at [facility] and ED on a7/29 and 8/1 Lumbar films reviewed  FINDINGS: Frontal and lateral views of the lumbar spine are obtained on 3 images. There are 5 non-rib-bearing lumbar type vertebral bodies in grossly normal anatomic alignment. Stable bony fusion across the L4-5 level. Multilevel spondylosis and facet hypertrophy greatest at L3-4. No acute fracture. Sacroiliac joints are unremarkable.   IMPRESSION: 1. Stable multilevel spondylosis and facet hypertrophy. Prior L4-5 fusion. 2. No acute fracture.  He was given pain meds on 8/1  Tramadol 50 mg #12 reviewed PDMP  Pt has not previously had any spinal surgery Mostly his back feels stiff but does not feel like there is "pain"  He feels weak from last Wednesday to now he cannot hold up his own weight without holding onto things  He was able to get out of his home today by holding onto the door and the railing of the 3 stairs of his house and his brother helped get him to a doctor's appointment today.  He is unable to walk out of the car into the clinic but he was able to transfer from the car to a wheelchair that his brother got him He is able to go from sitting to standing only by pushing up on the countertop in the exam table and only able to stand if able to hold onto something    Patient Active Problem List   Diagnosis Date Noted   Abnormal prostate exam 05/09/2022   Aortic atherosclerosis (HCC) 02/24/2022   Smokes with greater than 30 pack year history 11/18/2021    Coronary artery disease of native artery of native heart with stable angina pectoris (HCC) 05/19/2021   Dyslipidemia 07/30/2020   Polyp of colon    Type 2 diabetes mellitus, controlled (HCC) 02/08/2018   History of myocardial infarction 11/08/2017   Tobacco use 11/08/2017   Ischemic cardiomyopathy 11/08/2017   CHF (congestive heart failure) (HCC) 11/08/2017   Foot callus 11/08/2017   Onychomycosis of multiple toenails with type 2 diabetes mellitus (HCC) 11/08/2017   Cataract, nuclear sclerotic, left eye 04/04/2016      Current Outpatient Medications:    ACCU-CHEK GUIDE test strip, SMARTSIG:Via Meter 1-4 Times Daily, Disp: , Rfl:    Accu-Chek Softclix Lancets lancets, SMARTSIG:Via Meter 1-4 Times Daily, Disp: , Rfl:    aspirin 81 MG chewable tablet, Chew 1 tablet (81 mg total) by mouth daily., Disp: 30 tablet, Rfl: 2   atorvastatin (LIPITOR) 80 MG tablet, Take 1 tablet (80 mg total) by mouth at bedtime., Disp: 90 tablet, Rfl: 0   Blood Gluc Meter Disp-Strips (BLOOD GLUCOSE METER DISPOSABLE) DEVI, Check fingerstick blood sugars once a day; E11.69, LON 99 months, Disp: 100 each, Rfl: 1   blood glucose meter kit and supplies KIT, Dispense based on patient and insurance preference. Use up to four times daily as directed. (FOR ICD-9 250.00, 250.01).,  Disp: 1 each, Rfl: 0   carvedilol (COREG) 6.25 MG tablet, Take 1 tablet (6.25 mg total) by mouth 2 (two) times daily with a meal., Disp: 180 tablet, Rfl: 3   empagliflozin (JARDIANCE) 10 MG TABS tablet, Take 1 tablet (10 mg total) by mouth daily before breakfast., Disp: 90 tablet, Rfl: 1   ENTRESTO 24-26 MG, TAKE 1 TABLET BY MOUTH TWICE DAILY, Disp: 60 tablet, Rfl: 0   metFORMIN (GLUCOPHAGE) 500 MG tablet, Take 1 tablet (500 mg total) by mouth 2 (two) times daily with a meal., Disp: 180 tablet, Rfl: 3   sildenafil (REVATIO) 20 MG tablet, Take 1-5 tablets (20-100 mg total) by mouth daily as needed (use 30 min prior to sexual activity)., Disp: 15  tablet, Rfl: 1   spironolactone (ALDACTONE) 25 MG tablet, TAKE 1 TABLET(25 MG) BY MOUTH DAILY, Disp: 90 tablet, Rfl: 2   tamsulosin (FLOMAX) 0.4 MG CAPS capsule, TAKE 1 CAPSULE(0.4 MG) BY MOUTH DAILY, Disp: 90 capsule, Rfl: 3   traMADol (ULTRAM) 50 MG tablet, Take 1 tablet (50 mg total) by mouth every 6 (six) hours as needed., Disp: 12 tablet, Rfl: 0   No Known Allergies   Social History   Tobacco Use   Smoking status: Every Day    Current packs/day: 1.00    Average packs/day: 1 pack/day for 48.6 years (48.6 ttl pk-yrs)    Types: Cigarettes    Start date: 08/21/1974    Passive exposure: Current   Smokeless tobacco: Never   Tobacco comments:    Started smoking around 67 y/o has stopped several times  Vaping Use   Vaping status: Never Used  Substance Use Topics   Alcohol use: Yes    Comment: rare   Drug use: No      Chart Review Today: I personally reviewed active problem list, medication list, allergies, family history, social history, health maintenance, notes from last encounter, lab results, imaging with the patient/caregiver today.   Review of Systems  Constitutional: Negative.   HENT: Negative.    Eyes: Negative.   Respiratory: Negative.    Cardiovascular: Negative.   Gastrointestinal: Negative.   Endocrine: Negative.   Genitourinary: Negative.   Musculoskeletal: Negative.   Skin: Negative.   Allergic/Immunologic: Negative.   Neurological: Negative.   Hematological: Negative.   Psychiatric/Behavioral: Negative.    All other systems reviewed and are negative.      Objective:   Vitals:   03/26/23 1322  BP: 112/68  Pulse: 62  Resp: 16  SpO2: 95%  Weight: 214 lb 3.2 oz (97.2 kg)  Height: 6' (1.829 m)    Body mass index is 29.05 kg/m.  Physical Exam Vitals and nursing note reviewed.  Constitutional:      General: He is not in acute distress.    Appearance: Normal appearance. He is not ill-appearing, toxic-appearing or diaphoretic.  Musculoskeletal:      Comments: No midline tenderness or step-offs from cervical to lumbar spine, no paraspinal muscle tenderness to palpation Patient examined in a seated position bilateral 5/5 dorsiflexion and plantarflexion 4/5 strength with hip flexion on the left side, 5/5 strength with hip flexion on the right Grossly normal sensation bilaterally to lower extremities to light touch 1+ edema L>R  Neurological:     Mental Status: He is alert.      Results for orders placed or performed in visit on 10/03/22  POCT glycosylated hemoglobin (Hb A1C)  Result Value Ref Range   Hemoglobin A1C 6.7 (A) 4.0 - 5.6 %  HbA1c POC (<> result, manual entry)     HbA1c, POC (prediabetic range)     HbA1c, POC (controlled diabetic range)         Assessment & Plan:     ICD-10-CM   1. Bilateral low back pain with sciatica, sciatica laterality unspecified, unspecified chronicity  M54.40 predniSONE (DELTASONE) 20 MG tablet    baclofen (LIORESAL) 10 MG tablet    MR Lumbar Spine Wo Contrast    Ambulatory referral to Neurosurgery   mild x 1 week no injury, feels stiff, no tenderness on exam    2. Weakness of left lower extremity  R29.898 MR Lumbar Spine Wo Contrast    Ambulatory referral to Neurosurgery    3. Impaired functional mobility, balance, gait, and endurance  Z74.09 Ambulatory referral to Neurosurgery     I have tried to reach out to neurosurgery PA Will try to get stat MRI If unable to get imaging stat or contact neurosurgery team for urgent eval then will have to send pt to ED for further eval He was recently seen in the ER about 4 days ago at that time he had some mild weakness he now cannot stand independently or take any steps in the exam room Concerns for cauda equina He has preserved coordination with bilateral shin to heel - however cannot assess gait for ataxia.  He was able to do finger nose finger from seated position in [place] in exam room.  CVA is still in ddx - sx onset more than 4-5 d ago Normal  bilteral UE strength/sensation CN 2-12 intact     Danelle Berry, PA-C 03/26/23 1:33 PM

## 2023-03-28 NOTE — Progress Notes (Deleted)
Referring Physician:  Danelle Berry, PA-C [address] [place],  [place] [address]  Primary Physician:  Danelle Berry, PA-C  History of Present Illness: 03/28/2023 [name] is here today with a chief complaint of ***  lower back pain with radiating pain into feet? He also complains of weakness in the bilateral legs.   Duration: ***1-2 weeks? Location: *** Quality: ***stiff Severity: *** 10/10 Precipitating: aggravated by ***standing, walking? Modifying factors: made better by *** Weakness: none Timing: ***constant Bowel/Bladder Dysfunction: none  Conservative measures:  Physical therapy: *** has not participated in? Multimodal medical therapy including regular antiinflammatories: *** tramadol, baclofen,  Injections: *** no epidural steroid injections?  Past Surgery: ***denies  Lawrence Holmes has ***no symptoms of cervical myelopathy.  The symptoms are causing a significant impact on the patient's life.   I have utilized the care everywhere function in epic to review the outside records available from external health systems.  Review of Systems:  A 10 point review of systems is negative, except for the pertinent positives and negatives detailed in the HPI.  Past Medical History: Past Medical History:  Diagnosis Date   Aortic atherosclerosis (HCC)    Arthritis    Chronic systolic heart failure (HCC) 07/23/2017   a.) TTE 07/23/2017: EF 30-35%, mild LVH, inferolateral and inferior AK, mild BAE, G1DD; b.) TTE 10/23/2017: EF 40-45%, mild LVH, basal-mid inferior and inferoseptal AK, mild MR; c.) TTE 11/19/2021: EF 45-50%, glob HK with mod to severe inferior and basal to mid septal HK, GLS -15.3%, mild MR, G1DD.   Coronary artery disease 07/21/2017   a. 07/2017 Late presenting inferior STEMI/Cath: LM nl, LAD min irregs, LCX 100d (L->L collats), RCA 100p (L->R collats), EF 25-35%-->Med Rx; b. 10/2017 ETT (DOT): Ex time 7:38, baseline antlat TWI, no acute  changes.    DDD (degenerative disc disease), lumbar    Diverticulosis of colon    Diverticulosis of large intestine without diverticulitis    Enlarged prostate    Erectile dysfunction    a.) on PDE5i (sildenafil) PRN   HTN (hypertension)    Hx of bilateral cataract extraction    Hyperlipidemia    Ischemic cardiomyopathy    a.) LHC 07/21/2017: EF 25-35%; b.) TTE 07/23/2017: 30-35%; c.) TTE 10/23/2017: EF 40-45%; d.) TTE 11/19/2021: EF 45-50%   Left inguinal hernia    Left inguinal hernia 05/09/2022   ST elevation myocardial infarction (STEMI) of inferior wall (HCC) 07/21/2017   a.) LHC 07/21/2017: EF 25-35%, LVEDP 14 mmHg. 40% pLCx, 40% mLCx, 100% dLCx, 100% pRCA --> attempted to cross wire at RCA lesion, however unable. Reasonable RCA collaterals already formed --> med mgmt.   T2DM (type 2 diabetes mellitus) (HCC)    Tobacco use     Past Surgical History: Past Surgical History:  Procedure Laterality Date   CATARACT EXTRACTION Bilateral    COLONOSCOPY WITH PROPOFOL N/A 04/04/2019   Procedure: COLONOSCOPY WITH PROPOFOL;  Surgeon: Pasty Spillers, MD;  Location: [facility] ENDOSCOPY;  Service: Endoscopy;  Laterality: N/A;   LEFT HEART CATH AND CORONARY ANGIOGRAPHY N/A 07/21/2017   Procedure: LEFT HEART CATH AND CORONARY ANGIOGRAPHY;  Surgeon: Iran Ouch, MD;  Location: [facility] CV [facility];  Service: Cardiovascular;  Laterality: N/A;   XI ROBOTIC ASSISTED INGUINAL HERNIA REPAIR WITH MESH Left 05/19/2022   Procedure: XI ROBOTIC ASSISTED INGUINAL HERNIA REPAIR WITH MESH;  Surgeon: Campbell Lerner, MD;  Location: [facility] ORS;  Service: General;  Laterality: Left;    Allergies: Allergies as  of 03/29/2023   (No Known Allergies)    Medications:  Current Outpatient Medications:    ACCU-CHEK GUIDE test strip, SMARTSIG:Via Meter 1-4 Times Daily, Disp: , Rfl:    Accu-Chek Softclix Lancets lancets, SMARTSIG:Via Meter 1-4 Times Daily, Disp: , Rfl:    aspirin 81 MG chewable tablet, Chew 1  tablet (81 mg total) by mouth daily., Disp: 30 tablet, Rfl: 2   atorvastatin (LIPITOR) 80 MG tablet, Take 1 tablet (80 mg total) by mouth at bedtime., Disp: 90 tablet, Rfl: 0   baclofen (LIORESAL) 10 MG tablet, Take 0.5-1 tablets (5-10 mg total) by mouth 3 (three) times daily as needed for muscle spasms., Disp: 30 tablet, Rfl: 1   Blood Gluc Meter Disp-Strips (BLOOD GLUCOSE METER DISPOSABLE) DEVI, Check fingerstick blood sugars once a day; E11.69, LON 99 months, Disp: 100 each, Rfl: 1   blood glucose meter kit and supplies KIT, Dispense based on patient and insurance preference. Use up to four times daily as directed. (FOR ICD-9 250.00, 250.01)., Disp: 1 each, Rfl: 0   carvedilol (COREG) 6.25 MG tablet, Take 1 tablet (6.25 mg total) by mouth 2 (two) times daily with a meal., Disp: 180 tablet, Rfl: 3   empagliflozin (JARDIANCE) 10 MG TABS tablet, Take 1 tablet (10 mg total) by mouth daily before breakfast., Disp: 90 tablet, Rfl: 1   ENTRESTO 24-26 MG, TAKE 1 TABLET BY MOUTH TWICE DAILY, Disp: 60 tablet, Rfl: 0   metFORMIN (GLUCOPHAGE) 500 MG tablet, Take 1 tablet (500 mg total) by mouth 2 (two) times daily with a meal., Disp: 180 tablet, Rfl: 3   predniSONE (DELTASONE) 20 MG tablet, Take 2 tablets (40 mg total) by mouth daily with breakfast for 5 days., Disp: 10 tablet, Rfl: 0   sildenafil (REVATIO) 20 MG tablet, Take 1-5 tablets (20-100 mg total) by mouth daily as needed (use 30 min prior to sexual activity)., Disp: 15 tablet, Rfl: 1   spironolactone (ALDACTONE) 25 MG tablet, TAKE 1 TABLET(25 MG) BY MOUTH DAILY, Disp: 90 tablet, Rfl: 2   tamsulosin (FLOMAX) 0.4 MG CAPS capsule, TAKE 1 CAPSULE(0.4 MG) BY MOUTH DAILY, Disp: 90 capsule, Rfl: 3   traMADol (ULTRAM) 50 MG tablet, Take 1 tablet (50 mg total) by mouth every 6 (six) hours as needed., Disp: 12 tablet, Rfl: 0  Social History: Social History   Tobacco Use   Smoking status: Every Day    Current packs/day: 1.00    Average packs/day: 1  pack/day for 48.6 years (48.6 ttl pk-yrs)    Types: Cigarettes    Start date: 08/21/1974    Passive exposure: Current   Smokeless tobacco: Never   Tobacco comments:    Started smoking around 67 y/o has stopped several times  Vaping Use   Vaping status: Never Used  Substance Use Topics   Alcohol use: Yes    Comment: rare   Drug use: No    Family Medical History: Family History  Problem Relation Age of Onset   Heart disease Mother    Hyperlipidemia Mother    Hypertension Mother    Heart attack Sister     Physical Examination: There were no vitals filed for this visit.  General: Patient is in no apparent distress. Attention to examination is appropriate.  Neck:   Supple.  Full range of motion.  Respiratory: Patient is breathing without any difficulty.   NEUROLOGICAL:     Awake, alert, oriented to person, place, and time.  Speech is clear and fluent.   Cranial  Nerves: Pupils equal round and reactive to light.  Facial tone is symmetric.  Facial sensation is symmetric. Shoulder shrug is symmetric. Tongue protrusion is midline.    Strength: Side Biceps Triceps Deltoid Interossei Grip Wrist Ext. Wrist Flex.  R 5 5 5 5 5 5 5   L 5 5 5 5 5 5 5    Side Iliopsoas Quads Hamstring PF DF EHL  R 5 5 5 5 5 5   L 5 5 5 5 5 5    Reflexes are ***2+ and symmetric at the biceps, triceps, brachioradialis, patella and achilles.   Hoffman's is absent. Clonus is absent  Bilateral upper and lower extremity sensation is intact to light touch ***.     No evidence of dysmetria noted.  Gait is normal.    Imaging: *** I have personally reviewed the images and agree with the above interpretation.  Medical Decision Making/Assessment and Plan: Lawrence Holmes is a pleasant 67 y.o. male with ***  There are no diagnoses linked to this encounter.   Thank you for involving me in the care of this patient.    Lovenia Kim MD/MSCR Neurosurgery

## 2023-03-29 ENCOUNTER — Ambulatory Visit: Payer: 59 | Admitting: Neurosurgery

## 2023-04-02 ENCOUNTER — Encounter: Payer: Self-pay | Admitting: Neurosurgery

## 2023-04-02 ENCOUNTER — Ambulatory Visit (INDEPENDENT_AMBULATORY_CARE_PROVIDER_SITE_OTHER): Payer: 59 | Admitting: Neurosurgery

## 2023-04-02 ENCOUNTER — Encounter
Admission: RE | Admit: 2023-04-02 | Discharge: 2023-04-02 | Disposition: A | Discharge status: Home / Self Care | Payer: 59 | Source: Ambulatory Visit | Attending: Acute Care | Admitting: Acute Care

## 2023-04-02 ENCOUNTER — Other Ambulatory Visit: Payer: Self-pay

## 2023-04-02 VITALS — BP 125/60 | Ht 72.0 in | Wt 215.0 lb

## 2023-04-02 DIAGNOSIS — Z01818 Encounter for other preprocedural examination: Secondary | ICD-10-CM | POA: Diagnosis not present

## 2023-04-02 DIAGNOSIS — R29898 Other symptoms and signs involving the musculoskeletal system: Secondary | ICD-10-CM

## 2023-04-02 DIAGNOSIS — I509 Heart failure, unspecified: Secondary | ICD-10-CM | POA: Diagnosis not present

## 2023-04-02 DIAGNOSIS — E119 Type 2 diabetes mellitus without complications: Secondary | ICD-10-CM | POA: Insufficient documentation

## 2023-04-02 DIAGNOSIS — M5106 Intervertebral disc disorders with myelopathy, lumbar region: Secondary | ICD-10-CM | POA: Diagnosis not present

## 2023-04-02 DIAGNOSIS — I700 Atherosclerosis of aorta: Secondary | ICD-10-CM

## 2023-04-02 DIAGNOSIS — F17210 Nicotine dependence, cigarettes, uncomplicated: Secondary | ICD-10-CM

## 2023-04-02 DIAGNOSIS — E785 Hyperlipidemia, unspecified: Secondary | ICD-10-CM | POA: Diagnosis not present

## 2023-04-02 DIAGNOSIS — Z01810 Encounter for preprocedural cardiovascular examination: Secondary | ICD-10-CM | POA: Diagnosis not present

## 2023-04-02 DIAGNOSIS — I25118 Atherosclerotic heart disease of native coronary artery with other forms of angina pectoris: Secondary | ICD-10-CM

## 2023-04-02 DIAGNOSIS — I255 Ischemic cardiomyopathy: Secondary | ICD-10-CM | POA: Diagnosis not present

## 2023-04-02 DIAGNOSIS — I252 Old myocardial infarction: Secondary | ICD-10-CM

## 2023-04-02 DIAGNOSIS — Z01812 Encounter for preprocedural laboratory examination: Secondary | ICD-10-CM

## 2023-04-02 LAB — CBC
HCT: 48.6 % (ref 39.0–52.0)
Hemoglobin: 16.4 g/dL (ref 13.0–17.0)
MCH: 31.2 pg (ref 26.0–34.0)
MCHC: 33.7 g/dL (ref 30.0–36.0)
MCV: 92.6 fL (ref 80.0–100.0)
Platelets: 290 10*3/uL (ref 150–400)
RBC: 5.25 MIL/uL (ref 4.22–5.81)
RDW: 13.8 % (ref 11.5–15.5)
WBC: 10.8 10*3/uL — ABNORMAL HIGH (ref 4.0–10.5)
nRBC: 0 % (ref 0.0–0.2)

## 2023-04-02 LAB — BASIC METABOLIC PANEL
Anion gap: 9 (ref 5–15)
BUN: 23 mg/dL (ref 8–23)
CO2: 22 mmol/L (ref 22–32)
Calcium: 8.8 mg/dL — ABNORMAL LOW (ref 8.9–10.3)
Chloride: 106 mmol/L (ref 98–111)
Creatinine, Ser: 1.03 mg/dL (ref 0.61–1.24)
GFR, Estimated: >60 mL/min (ref >60)
Glucose, Bld: 91 mg/dL (ref 70–99)
Potassium: 3.6 mmol/L (ref 3.5–5.1)
Sodium: 137 mmol/L (ref 135–145)

## 2023-04-02 LAB — URINALYSIS, COMPLETE (UACMP) WITH MICROSCOPIC
Bilirubin Urine: NEGATIVE
Glucose, UA: >=500 mg/dL — AB
Ketones, ur: NEGATIVE mg/dL
Nitrite: NEGATIVE
Protein, ur: NEGATIVE mg/dL
Specific Gravity, Urine: 1.025 (ref 1.005–1.030)
pH: 5 (ref 5.0–8.0)

## 2023-04-02 LAB — SURGICAL PCR SCREEN
MRSA, PCR: NEGATIVE
Staphylococcus aureus: NEGATIVE

## 2023-04-02 LAB — TYPE AND SCREEN
ABO/RH(D): O POS
Antibody Screen: NEGATIVE

## 2023-04-02 NOTE — Progress Notes (Signed)
Referring Physician:  Danelle Berry, PA-C [address] [place],  [place] [address]  Primary Physician:  Danelle Berry, PA-C  History of Present Illness: 04/02/2023 Lawrence Holmes is here today with a chief complaint of 2 to 3 weeks of lower back pain with radiation into his legs.  He initially started with significant weakness in his left lower extremity however this is now bilateral.  He is not complaining of any bowel or bladder sensation loss.  Has not complaining of any incontinence.  He does complain of severe weakness in his left lower extremity and mild weakness in his right lower extremity.  He often feels that he has to drag his leg.  He feels tingling down his bilateral lower extremities mostly past his knees.  Left worse than right.  He also has numbness in his left lower extremity worse than his right.  He states that it is constantly bothering him.  Severity can go to a 10 out of 10 as far as pain goes.  He has not had physical therapy nor injections.  He has tried tramadol and baclofen.  He has not had any previous spine surgery.  The symptoms are causing a significant impact on the patient's life.   I have utilized the care everywhere function in epic to review the outside records available from external health systems.  Review of Systems:  A 10 point review of systems is negative, except for the pertinent positives and negatives detailed in the HPI.  Past Medical History: Past Medical History:  Diagnosis Date   Aortic atherosclerosis (HCC)    Arthritis    Chronic systolic heart failure (HCC) 07/23/2017   a.) TTE 07/23/2017: EF 30-35%, mild LVH, inferolateral and inferior AK, mild BAE, G1DD; b.) TTE 10/23/2017: EF 40-45%, mild LVH, basal-mid inferior and inferoseptal AK, mild MR; c.) TTE 11/19/2021: EF 45-50%, glob HK with mod to severe inferior and basal to mid septal HK, GLS -15.3%, mild MR, G1DD.   Coronary artery disease 07/21/2017   a. 07/2017 Late  presenting inferior STEMI/Cath: LM nl, LAD min irregs, LCX 100d (L->L collats), RCA 100p (L->R collats), EF 25-35%-->Med Rx; b. 10/2017 ETT (DOT): Ex time 7:38, baseline antlat TWI, no acute changes.    DDD (degenerative disc disease), lumbar    Diverticulosis of colon    Diverticulosis of large intestine without diverticulitis    Enlarged prostate    Erectile dysfunction    a.) on PDE5i (sildenafil) PRN   HTN (hypertension)    Hx of bilateral cataract extraction    Hyperlipidemia    Ischemic cardiomyopathy    a.) LHC 07/21/2017: EF 25-35%; b.) TTE 07/23/2017: 30-35%; c.) TTE 10/23/2017: EF 40-45%; d.) TTE 11/19/2021: EF 45-50%   Left inguinal hernia    Left inguinal hernia 05/09/2022   ST elevation myocardial infarction (STEMI) of inferior wall (HCC) 07/21/2017   a.) LHC 07/21/2017: EF 25-35%, LVEDP 14 mmHg. 40% pLCx, 40% mLCx, 100% dLCx, 100% pRCA --> attempted to cross wire at RCA lesion, however unable. Reasonable RCA collaterals already formed --> med mgmt.   T2DM (type 2 diabetes mellitus) (HCC)    Tobacco use     Past Surgical History: Past Surgical History:  Procedure Laterality Date   CATARACT EXTRACTION Bilateral    COLONOSCOPY WITH PROPOFOL N/A 04/04/2019   Procedure: COLONOSCOPY WITH PROPOFOL;  Surgeon: Pasty Spillers, MD;  Location: [facility];  Service: Endoscopy;  Laterality: N/A;   LEFT HEART CATH AND CORONARY ANGIOGRAPHY N/A 07/21/2017  Procedure: LEFT HEART CATH AND CORONARY ANGIOGRAPHY;  Surgeon: Iran Ouch, MD;  Location: [facility];  Service: Cardiovascular;  Laterality: N/A;   XI ROBOTIC ASSISTED INGUINAL HERNIA REPAIR WITH MESH Left 05/19/2022   Procedure: XI ROBOTIC ASSISTED INGUINAL HERNIA REPAIR WITH MESH;  Surgeon: Campbell Lerner, MD;  Location: [facility] ORS;  Service: General;  Laterality: Left;    Allergies: Allergies as of 04/02/2023   (No Known Allergies)    Medications:  Current Outpatient Medications:    ACCU-CHEK GUIDE  test strip, SMARTSIG:Via Meter 1-4 Times Daily, Disp: , Rfl:    Accu-Chek Softclix Lancets lancets, SMARTSIG:Via Meter 1-4 Times Daily, Disp: , Rfl:    aspirin 81 MG chewable tablet, Chew 1 tablet (81 mg total) by mouth daily., Disp: 30 tablet, Rfl: 2   atorvastatin (LIPITOR) 80 MG tablet, Take 1 tablet (80 mg total) by mouth at bedtime., Disp: 90 tablet, Rfl: 0   baclofen (LIORESAL) 10 MG tablet, Take 0.5-1 tablets (5-10 mg total) by mouth 3 (three) times daily as needed for muscle spasms., Disp: 30 tablet, Rfl: 1   Blood Gluc Meter Disp-Strips (BLOOD GLUCOSE METER DISPOSABLE) DEVI, Check fingerstick blood sugars once a day; E11.69, LON 99 months, Disp: 100 each, Rfl: 1   blood glucose meter kit and supplies KIT, Dispense based on patient and insurance preference. Use up to four times daily as directed. (FOR ICD-9 250.00, 250.01)., Disp: 1 each, Rfl: 0   carvedilol (COREG) 6.25 MG tablet, Take 1 tablet (6.25 mg total) by mouth 2 (two) times daily with a meal., Disp: 180 tablet, Rfl: 3   empagliflozin (JARDIANCE) 10 MG TABS tablet, Take 1 tablet (10 mg total) by mouth daily before breakfast., Disp: 90 tablet, Rfl: 1   ENTRESTO 24-26 MG, TAKE 1 TABLET BY MOUTH TWICE DAILY, Disp: 60 tablet, Rfl: 0   metFORMIN (GLUCOPHAGE) 500 MG tablet, Take 1 tablet (500 mg total) by mouth 2 (two) times daily with a meal., Disp: 180 tablet, Rfl: 3   sildenafil (REVATIO) 20 MG tablet, Take 1-5 tablets (20-100 mg total) by mouth daily as needed (use 30 min prior to sexual activity)., Disp: 15 tablet, Rfl: 1   spironolactone (ALDACTONE) 25 MG tablet, TAKE 1 TABLET(25 MG) BY MOUTH DAILY, Disp: 90 tablet, Rfl: 2   tamsulosin (FLOMAX) 0.4 MG CAPS capsule, TAKE 1 CAPSULE(0.4 MG) BY MOUTH DAILY, Disp: 90 capsule, Rfl: 3   traMADol (ULTRAM) 50 MG tablet, Take 1 tablet (50 mg total) by mouth every 6 (six) hours as needed., Disp: 12 tablet, Rfl: 0  Social History: Social History   Tobacco Use   Smoking status: Every Day     Current packs/day: 1.00    Average packs/day: 1 pack/day for 48.6 years (48.6 ttl pk-yrs)    Types: Cigarettes    Start date: 08/21/1974    Passive exposure: Current   Smokeless tobacco: Never   Tobacco comments:    Started smoking around 67 y/o has stopped several times  Vaping Use   Vaping status: Never Used  Substance Use Topics   Alcohol use: Yes    Comment: rare   Drug use: No    Family Medical History: Family History  Problem Relation Age of Onset   Heart disease Mother    Hyperlipidemia Mother    Hypertension Mother    Heart attack Sister     Physical Examination: There were no vitals filed for this visit.  General: Patient is in no apparent distress. Attention to examination is  appropriate.  Neck:   Supple.  Full range of motion.  Respiratory: Patient is breathing without any difficulty.   NEUROLOGICAL:     Awake, alert, oriented to person, place, and time.  Speech is clear and fluent.   Cranial Nerves: Pupils equal round and reactive to light.  Facial tone is symmetric.  Facial sensation is symmetric. Shoulder shrug is symmetric. Tongue protrusion is midline.    Strength: LUMBAR PLEXUS NERVE AND MUSCLE EXAMINATION        DATE  04/02/23      Side Inovlved  R/L      Examiner  BWS    NERVE ROOTS MUSCLE      Femoral  L1,L2,L3 Illiopsoas  4+/4+     L2,L3,L4 Quadriceps  4+/4+    Obturator L2,L3,L4 Thigh Adductors  5    Superior Gluteal L4,L5,S1 Gluteus Minimus Medius     Inferior Gluteal L5,S1,S2 Gluteus Maximus      Sciatic L5,S1,S2 Hamstrings  4+/3    Tibial S1,S2 Gastrocnemius Soleus 4+/4     L4,L5 Tibialis Posterior   4/2     L5,S1,S2 Flexor Digitorum Longus 4/4     S1,S2 Foot Intrinsics  2/2    Deep Peroneal L4,L5 Tibialis Anterior  4/2     L5,S1  Extensor Digitorum Longus 4/1     L5,S2 Extensor Hallucis Longus 4/1    Superficial Peroneal L5,S1 Peroneus Longus Brevis 4/1        Reflexes are 2+ at the bilateral patellas, absent in the left  Achilles, 1+ on the right Achilles.  Clonus is absent  Decreased lower extremity sensation on the posterior thigh and circumferential leg distal to the knee, left worse than right.  Wheel Chair  Imaging Narrative & Impression  CLINICAL DATA:  Low back pain, cauda equina syndrome suspected   EXAM: MRI LUMBAR SPINE WITHOUT CONTRAST   TECHNIQUE: Multiplanar, multisequence MR imaging of the lumbar spine was performed. No intravenous contrast was administered.   COMPARISON:  None Available.   FINDINGS: Segmentation:  Standard.   Alignment:  There is trace anterolisthesis of L2 on L3.   Vertebrae: No fracture, evidence of discitis, or bone lesion. There is osseous fusion of L4-L5.   Conus medullaris and cauda equina: Conus extends to the L1-L2 disc space level. Conus and cauda equina appear normal.   Paraspinal and other soft tissues: 7 mm T2 hyperintense lesion in the interpolar region of the right kidney, favored to represent a renal cysts requiring no further workup.   Disc levels:   T12-L1: Mild bilateral facet degenerative change with ligamentum flavum hypertrophy. No spinal canal narrowing. No neural foraminal narrowing.   L1-L2: Eccentric left disc bulge. Mild bilateral facet degenerative change. Mild overall spinal canal narrowing. There is narrowing of the left lateral recess. Mild bilateral neural foraminal narrowing.   L2-L3: Moderate bilateral facet degenerative change with ligamentum flavum hypertrophy. Circumferential disc bulge. Short pedicles. Severe spinal canal narrowing. Moderate right and mild left neural foraminal narrowing.   L3-L4: Mild bilateral facet degenerative change. Circumferential disc bulge. Mild spinal canal narrowing. Mild bilateral neural foraminal narrowing.   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 No spinal canal narrowing. No neural foraminal narrowing.   L5-S1: Severe right and moderate left facet degenerative  change. Circumferential disc bulge. Mild spinal canal narrowing. Severe left and moderate right neural foraminal narrowing.   IMPRESSION: 1. Severe spinal canal narrowing at L2-L3 secondary to a combination of ligamentum flavum hypertrophy, short pedicles, and a disc bulge. 2. Severe left  and moderate right neural foraminal narrowing at L5-S1.     Electronically Signed   By: Lorenza Cambridge M.D.   On: 03/26/2023 15:51           I have personally reviewed the images and agree with the above interpretation.  Medical Decision Making/Assessment and Plan: Bilateral leg weakness Intervertebral lumbar disc disorder with myelopathy, lumbar region  [name] is a pleasant 67 y.o. male with history of acute progressive onset of bilateral lower extremity weakness.  Is worse on the left than it is on the right.  He also has progressive numbness in his lower extremities down the back of his thighs and distal to his knees.  On physical examination he shows severe weakness in the bilateral lower extremities.  Left is worse than the right.  Knee extension is relatively preserved at 4+ bilaterally.  However distal to the knee he is showing significant weakness in plantarflexion, dorsiflexion, toe extension, toe flexion.  He is most severe in the left sided L5-S1, and some muscle groups 1 out of 5.  In the L3 and L4 myotomes he grades at approximately 3 out of 5.  On the contralateral side he is slightly stronger.  His MRI demonstrates multilevel spondylosis, he has an autofusion of L4-L5, however he has severe stenosis at L2-L3 secondary to a disc herniation and spondylosis.  He has some less severe stenosis at the level above.  His neurologic exam localizes to the L2-3 level of compression.  Given these findings we would recommend a decompression and discectomy.  We likely approach from the right side as the disc is mostly extruded from the side but is causing total canal stenosis.  We performed  bilateral decompression and discectomy.  Thank you for involving me in the care of this patient.    Lovenia Kim MD/MSCR Neurosurgery

## 2023-04-02 NOTE — Patient Instructions (Signed)
Please see below for information in regards to your upcoming surgery:   Planned surgery: minimally invasive (MIS) L2-3 laminectomy and discectomy   Surgery date: 04/05/23 at [facility] (Medical Mall: [address], [place], [place] [address]) - you will find out your arrival time the business day before your surgery.   Pre-op appointment at [facility] Pre-admit Testing: we will call you with a date/time for this. If you are scheduled for an in person appointment, Pre-admit Testing is located on the first floor of the [facility] building, [address] [facility], [address]. Please bring all prescriptions in the original prescription bottles to your appointment, even if you have reviewed medications by phone with a pharmacy representative. During this appointment, they will advise you which medications you can take the morning of surgery, and which medications you will need to hold for surgery. Pre-op labs may be done at your pre-op appointment. You are not required to fast for these labs. Should you need to change your pre-op appointment, please call Pre-admit testing at [phone].    Blood thinners:    Aspirin:   ok to stay on aspirin 81mg      Diabetes medications:  Empagliflozin (Jardiance): hold for 3 days prior to surgery Metformin: hold for 2 days prior to surgery    Common restrictions after surgery: No bending, lifting, or twisting ("BLT"). Avoid lifting objects heavier than 10 pounds for the first 6 weeks after surgery. Where possible, avoid household activities that involve lifting, bending, reaching, pushing, or pulling such as laundry, vacuuming, grocery shopping, and childcare. Try to arrange for help from friends and family for these activities while you heal. Do not drive while taking prescription pain medication. Weeks 6 through 12 after surgery: avoid lifting more than 25 pounds.      How to contact us:  If you have any  questions/concerns before or after surgery, you can reach [place] at [phone], or you can send a mychart message. We can be reached by phone or mychart 8am-4pm, Monday-Friday.  *Please note: Calls after 4pm are forwarded to a third party answering service. Mychart messages are not routinely monitored during evenings, weekends, and holidays. Please call our office to contact the answering service for urgent concerns during non-business hours.     If you have FMLA/disability paperwork, please drop it off or fax it to [phone], attention Patty.   Appointments/FMLA & disability paperwork: Patty & Cristin  Nurse: Royston Cowper  Medical assistants: Laurann Montana, & Lyla Son Physician Assistants: Manning Charity & Drake Leach Surgeons: Venetia Night, MD & Ernestine Mcmurray, MD

## 2023-04-03 ENCOUNTER — Encounter: Payer: Self-pay | Admitting: Family Medicine

## 2023-04-03 ENCOUNTER — Ambulatory Visit: Payer: 59 | Admitting: Family Medicine

## 2023-04-03 VITALS — BP 118/64 | HR 79 | Temp 97.9°F | Resp 16 | Ht 72.0 in | Wt 271.1 lb

## 2023-04-03 DIAGNOSIS — Z720 Tobacco use: Secondary | ICD-10-CM

## 2023-04-03 DIAGNOSIS — E785 Hyperlipidemia, unspecified: Secondary | ICD-10-CM

## 2023-04-03 DIAGNOSIS — Z7984 Long term (current) use of oral hypoglycemic drugs: Secondary | ICD-10-CM | POA: Diagnosis not present

## 2023-04-03 DIAGNOSIS — E1165 Type 2 diabetes mellitus with hyperglycemia: Secondary | ICD-10-CM

## 2023-04-03 DIAGNOSIS — E119 Type 2 diabetes mellitus without complications: Secondary | ICD-10-CM

## 2023-04-03 DIAGNOSIS — Z5181 Encounter for therapeutic drug level monitoring: Secondary | ICD-10-CM

## 2023-04-03 NOTE — Assessment & Plan Note (Signed)
On lipitor 80 mg, labs done last oct Lab Results  Component Value Date   CHOL 135 05/26/2022   HDL 24 (L) 05/26/2022   LDLCALC 84 05/26/2022   TRIG 176 (H) 05/26/2022   CHOLHDL 5.6 (H) 05/26/2022

## 2023-04-03 NOTE — Progress Notes (Signed)
Name: Lawrence Holmes   MRN: [id number]    DOB: 01/28/1956   Date:04/03/2023       Progress Note  Chief Complaint  Patient presents with   Follow-up   Hypertension   Hyperlipidemia   Diabetes     Subjective:   Lawrence Holmes is a 67 y.o. male, presents to clinic for routine f/up and having surgery in 2 days  Preop eval at hospital done and some labs pending including A1C and lipids - on metformin 500 mg BID Lab Results  Component Value Date   HGBA1C 6.7 (A) 10/03/2022   Hypertension:  Has been well controlled on entresto, spironolactone, coreg BP Readings from Last 3 Encounters:  04/03/23 118/64  04/02/23 125/60  03/26/23 112/68   Pt denies CP, SOB, exertional sx, LE edema, palpitation, Ha's, visual disturbances, lightheadedness, hypotension, syncope.    Hyperlipidemia: atorvastatin 80 mg  Last Lipids: Lab Results  Component Value Date   CHOL 135 05/26/2022   HDL 24 (L) 05/26/2022   LDLCALC 84 05/26/2022   TRIG 176 (H) 05/26/2022   CHOLHDL 5.6 (H) 05/26/2022   - Denies: Chest pain, shortness of breath, myalgias, claudication  Going to surgery in 2 d, still weak with diminished sensation, not in pain    Current Outpatient Medications:    ACCU-CHEK GUIDE test strip, SMARTSIG:Via Meter 1-4 Times Daily, Disp: , Rfl:    Accu-Chek Softclix Lancets lancets, SMARTSIG:Via Meter 1-4 Times Daily, Disp: , Rfl:    aspirin 81 MG chewable tablet, Chew 1 tablet (81 mg total) by mouth daily., Disp: 30 tablet, Rfl: 2   atorvastatin (LIPITOR) 80 MG tablet, Take 1 tablet (80 mg total) by mouth at bedtime., Disp: 90 tablet, Rfl: 0   baclofen (LIORESAL) 10 MG tablet, Take 0.5-1 tablets (5-10 mg total) by mouth 3 (three) times daily as needed for muscle spasms., Disp: 30 tablet, Rfl: 1   Blood Gluc Meter Disp-Strips (BLOOD GLUCOSE METER DISPOSABLE) DEVI, Check fingerstick blood sugars once a day; E11.69, LON 99 months, Disp: 100 each, Rfl: 1   blood glucose meter kit and supplies KIT,  Dispense based on patient and insurance preference. Use up to four times daily as directed. (FOR ICD-9 250.00, 250.01)., Disp: 1 each, Rfl: 0   carvedilol (COREG) 6.25 MG tablet, Take 1 tablet (6.25 mg total) by mouth 2 (two) times daily with a meal., Disp: 180 tablet, Rfl: 3   empagliflozin (JARDIANCE) 10 MG TABS tablet, Take 1 tablet (10 mg total) by mouth daily before breakfast., Disp: 90 tablet, Rfl: 1   ENTRESTO 24-26 MG, TAKE 1 TABLET BY MOUTH TWICE DAILY, Disp: 60 tablet, Rfl: 0   metFORMIN (GLUCOPHAGE) 500 MG tablet, Take 1 tablet (500 mg total) by mouth 2 (two) times daily with a meal., Disp: 180 tablet, Rfl: 3   sildenafil (REVATIO) 20 MG tablet, Take 1-5 tablets (20-100 mg total) by mouth daily as needed (use 30 min prior to sexual activity)., Disp: 15 tablet, Rfl: 1   spironolactone (ALDACTONE) 25 MG tablet, TAKE 1 TABLET(25 MG) BY MOUTH DAILY, Disp: 90 tablet, Rfl: 2   tamsulosin (FLOMAX) 0.4 MG CAPS capsule, TAKE 1 CAPSULE(0.4 MG) BY MOUTH DAILY, Disp: 90 capsule, Rfl: 3   traMADol (ULTRAM) 50 MG tablet, Take 1 tablet (50 mg total) by mouth every 6 (six) hours as needed., Disp: 12 tablet, Rfl: 0  Patient Active Problem List   Diagnosis Date Noted   Intervertebral lumbar disc disorder with myelopathy, lumbar region 04/02/2023  Impaired functional mobility, balance, gait, and endurance 03/26/2023   Bilateral leg weakness 03/26/2023   Abnormal prostate exam 05/09/2022   Aortic atherosclerosis (HCC) 02/24/2022   Smokes with greater than 30 pack year history 11/18/2021   Coronary artery disease of native artery of native heart with stable angina pectoris (HCC) 05/19/2021   Dyslipidemia 07/30/2020   Polyp of colon    Type 2 diabetes mellitus, controlled (HCC) 02/08/2018   History of myocardial infarction 11/08/2017   Tobacco use 11/08/2017   Ischemic cardiomyopathy 11/08/2017   CHF (congestive heart failure) (HCC) 11/08/2017   Foot callus 11/08/2017   Onychomycosis of multiple  toenails with type 2 diabetes mellitus (HCC) 11/08/2017   Cataract, nuclear sclerotic, left eye 04/04/2016    Past Surgical History:  Procedure Laterality Date   CATARACT EXTRACTION Bilateral    COLONOSCOPY WITH PROPOFOL N/A 04/04/2019   Procedure: COLONOSCOPY WITH PROPOFOL;  Surgeon: Pasty Spillers, MD;  Location: [facility] ENDOSCOPY;  Service: Endoscopy;  Laterality: N/A;   LEFT HEART CATH AND CORONARY ANGIOGRAPHY N/A 07/21/2017   Procedure: LEFT HEART CATH AND CORONARY ANGIOGRAPHY;  Surgeon: Iran Ouch, MD;  Location: [facility] INVASIVE CV LAB;  Service: Cardiovascular;  Laterality: N/A;   XI ROBOTIC ASSISTED INGUINAL HERNIA REPAIR WITH MESH Left 05/19/2022   Procedure: XI ROBOTIC ASSISTED INGUINAL HERNIA REPAIR WITH MESH;  Surgeon: Campbell Lerner, MD;  Location: [facility] ORS;  Service: General;  Laterality: Left;    Family History  Problem Relation Age of Onset   Heart disease Mother    Hyperlipidemia Mother    Hypertension Mother    Heart attack Sister     Social History   Tobacco Use   Smoking status: Every Day    Current packs/day: 1.00    Average packs/day: 1 pack/day for 48.6 years (48.6 ttl pk-yrs)    Types: Cigarettes    Start date: 08/21/1974    Passive exposure: Current   Smokeless tobacco: Never   Tobacco comments:    Started smoking around 67 y/o has stopped several times  Vaping Use   Vaping status: Never Used  Substance Use Topics   Alcohol use: Yes    Comment: rare   Drug use: No     No Known Allergies  Health Maintenance  Topic Date Due   DTaP/Tdap/Td (1 - Tdap) Never done   OPHTHALMOLOGY EXAM  12/08/2022   INFLUENZA VACCINE  03/22/2023   HEMOGLOBIN A1C  04/03/2023   COVID-19 Vaccine (4 - 2023-24 season) 04/19/2023 (Originally 04/21/2022)   Zoster Vaccines- Shingrix (1 of 2) 07/04/2023 (Originally 12/26/2004)   Diabetic kidney evaluation - Urine ACR  05/27/2023   FOOT EXAM  05/27/2023   Lung Cancer Screening  01/22/2024   Diabetic kidney evaluation  - eGFR measurement  04/01/2024   Colonoscopy  04/03/2024   Pneumonia Vaccine 55+ Years old  Completed   Hepatitis C Screening  Completed   HPV VACCINES  Aged Out    Chart Review Today: I personally reviewed active problem list, medication list, allergies, family history, social history, health maintenance, notes from last encounter, lab results, imaging with the patient/caregiver today.   Review of Systems  Constitutional: Negative.   HENT: Negative.    Eyes: Negative.   Respiratory: Negative.    Cardiovascular: Negative.   Gastrointestinal: Negative.   Endocrine: Negative.   Genitourinary: Negative.   Musculoskeletal: Negative.   Skin: Negative.   Allergic/Immunologic: Negative.   Neurological: Negative.   Hematological: Negative.   Psychiatric/Behavioral: Negative.    All  other systems reviewed and are negative.    Objective:   Vitals:   04/03/23 1326  BP: 118/64  Pulse: 79  Resp: 16  Temp: 97.9 F (36.6 C)  TempSrc: Oral  SpO2: 92%  Weight: 271 lb 1.6 oz (123 kg)  Height: 6' (1.829 m)    Body mass index is 36.77 kg/m.  Physical Exam Vitals reviewed.  Constitutional:      General: He is not in acute distress.    Appearance: He is not ill-appearing, toxic-appearing or diaphoretic.  HENT:     Head: Normocephalic and atraumatic.  Cardiovascular:     Rate and Rhythm: Normal rate and regular rhythm.     Pulses: Normal pulses.     Heart sounds: Normal heart sounds.  Pulmonary:     Effort: Pulmonary effort is normal. No respiratory distress.     Breath sounds: Normal breath sounds. No stridor. No wheezing, rhonchi or rales.  Skin:    General: Skin is warm.  Neurological:     Mental Status: He is alert.     Motor: Weakness present.     Gait: Gait abnormal.     Comments: In WC, LLE weakness         Assessment & Plan:   Problem List Items Addressed This Visit       Endocrine   Type 2 diabetes mellitus, controlled (HCC) - Primary    Has been  well controlled on metformin 500 mg BID A1C appeared to be pending with preop clearance labs at hospital         Other   Tobacco use    Smoking cessation instruction/counseling given:  counseled patient on the dangers of tobacco use, advised patient to stop smoking, and reviewed strategies to maximize success       Dyslipidemia    On lipitor 80 mg, labs done last oct Lab Results  Component Value Date   CHOL 135 05/26/2022   HDL 24 (L) 05/26/2022   LDLCALC 84 05/26/2022   TRIG 176 (H) 05/26/2022   CHOLHDL 5.6 (H) 05/26/2022          Labs were just done at hospital - will review  He is going to have surgery in 2 days, his HTN and DM have been well controlled - we can do a 6 month f/up, encouraged him to come in sooner if there are med issues/changes with his surgery etc and we will do sooner f/up Return in about 6 months (around 10/04/2023) for Routine follow-up.   Danelle Berry, PA-C 04/03/23 1:31 PM

## 2023-04-03 NOTE — Assessment & Plan Note (Signed)
Smoking cessation instruction/counseling given:  counseled patient on the dangers of tobacco use, advised patient to stop smoking, and reviewed strategies to maximize success 

## 2023-04-03 NOTE — Assessment & Plan Note (Signed)
Has been well controlled on metformin 500 mg BID A1C appeared to be pending with preop clearance labs at hospital

## 2023-04-04 ENCOUNTER — Other Ambulatory Visit: Payer: Self-pay | Admitting: Neurosurgery

## 2023-04-04 ENCOUNTER — Other Ambulatory Visit: Payer: Self-pay

## 2023-04-04 ENCOUNTER — Other Ambulatory Visit (INDEPENDENT_AMBULATORY_CARE_PROVIDER_SITE_OTHER): Payer: 59 | Admitting: Neurosurgery

## 2023-04-04 ENCOUNTER — Encounter
Admission: RE | Admit: 2023-04-04 | Discharge: 2023-04-04 | Disposition: A | Discharge status: Home / Self Care | Payer: 59 | Source: Ambulatory Visit | Attending: Acute Care | Admitting: Acute Care

## 2023-04-04 DIAGNOSIS — E119 Type 2 diabetes mellitus without complications: Secondary | ICD-10-CM

## 2023-04-04 DIAGNOSIS — N3000 Acute cystitis without hematuria: Secondary | ICD-10-CM

## 2023-04-04 HISTORY — DX: Heart failure, unspecified: I50.9

## 2023-04-04 MED ORDER — AMOXICILLIN-POT CLAVULANATE 875-125 MG PO TABS: 1.0000 | ORAL_TABLET | Freq: Two times a day (BID) | ORAL | 0 refills | Status: DC

## 2023-04-04 MED ORDER — AMOXICILLIN-POT CLAVULANATE 875-125 MG PO TABS
1.0000 | ORAL_TABLET | Freq: Two times a day (BID) | ORAL | Status: DC
Start: 2023-04-04 — End: 2023-04-04

## 2023-04-04 MED ORDER — CEFAZOLIN IN SODIUM CHLORIDE 2-0.9 GM/100ML-% IV SOLN
2.0000 g | Freq: Once | INTRAVENOUS | Status: DC
  Filled 2023-04-04: qty 100

## 2023-04-04 MED ORDER — FAMOTIDINE 20 MG PO TABS
20.0000 mg | ORAL_TABLET | Freq: Once | ORAL | Status: AC
  Administered 2023-04-05: 20 mg via ORAL

## 2023-04-04 MED ORDER — CEFAZOLIN SODIUM-DEXTROSE 2-4 GM/100ML-% IV SOLN
2.0000 g | INTRAVENOUS | Status: AC
  Administered 2023-04-05: 2 g via INTRAVENOUS

## 2023-04-04 MED ORDER — ORAL CARE MOUTH RINSE: 15.0000 mL | Freq: Once | OROMUCOSAL | Status: AC

## 2023-04-04 MED ORDER — VANCOMYCIN HCL IN DEXTROSE 1-5 GM/200ML-% IV SOLN
1000.0000 mg | Freq: Once | INTRAVENOUS | Status: AC
  Administered 2023-04-05: 1000 mg via INTRAVENOUS

## 2023-04-04 MED ORDER — CHLORHEXIDINE GLUCONATE 0.12 % MT SOLN
15.0000 mL | Freq: Once | OROMUCOSAL | Status: AC
  Administered 2023-04-05: 15 mL via OROMUCOSAL

## 2023-04-04 NOTE — Progress Notes (Addendum)
  [facility] Perioperative Services: Pre-Admission/Anesthesia Testing  Abnormal Lab Notification   Date: 04/04/23  Name: Lawrence Holmes MRN:   [id number]  Re: Abnormal labs noted during PAT appointment   Notified:  Provider Name Provider Role Notification Mode  Loreen Freud, MD Neurosurgery Routed and/or faxed via [facility]   Abnormal Lab Value(s):   Lab Results  Component Value Date   COLORURINE YELLOW (A) 04/02/2023   APPEARANCEUR HAZY (A) 04/02/2023   LABSPEC 1.025 04/02/2023   PHURINE 5.0 04/02/2023   GLUCOSEU >=500 (A) 04/02/2023   HGBUR SMALL (A) 04/02/2023   BILIRUBINUR NEGATIVE 04/02/2023   KETONESUR NEGATIVE 04/02/2023   PROTEINUR NEGATIVE 04/02/2023   NITRITE NEGATIVE 04/02/2023   LEUKOCYTESUR TRACE (A) 04/02/2023   EPIU 0-5 04/02/2023   WBCU 11-20 04/02/2023   RBCU 0-5 04/02/2023   BACTERIA MANY (A) 04/02/2023   CULT (A) 04/02/2023    50,000 COLONIES/mL STREPTOCOCCUS GALLOLYTICUS 10,000 COLONIES/mL GROUP B STREP(S.AGALACTIAE)ISOLATED TESTING AGAINST S. AGALACTIAE NOT ROUTINELY PERFORMED DUE TO PREDICTABILITY OF AMP/PEN/VAN SUSCEPTIBILITY. SUSCEPTIBILITIES TO FOLLOW Performed at [facility], [address] [place], [place] [address]     Clinical Information and Notes:  Patient is scheduled for MINIMALLY INVASIVE (MIS) L2-3 LAMINECTOMY AND DISCECTOMY (Bilateral) on 04/05/2023.    UA performed in PAT consistent with/concerning for infection.  No leukocytosis noted on CBC; WBC 10.8 Renal function: Estimated Creatinine Clearance: 93 mL/min (by C-G formula based on SCr of 1.03 mg/dL). Urine C&S added to assess for pathogenically significant growth.  Impression and Plan:  Lawrence Holmes with a UA that was (+) for infection; reflex culture sent. Patient grew out both group B and D isolates. No time to treat patient with oral outpatient regimen, as his surgery date is tomorrow. Patient is scheduled to received preoperative CEFAZOLIN.  Sensitivity testing not performed for the identified  pathogenic isolates due to established predictability.   Communicated results to office. Reviewed low identified colony counts. Discussed postponing versus the consideration of adding vancomycin to preoperative regimen if deemed appropriate. MD agrees with plan for preop vancomycin. Dr. Katrinka Blazing will also call patient to have him start on oral antimicrobials tonight.   Review renal function reviewed. Estimated Creatinine Clearance: 93 mL/min (by C-G formula based on SCr of 1.03 mg/dL). Allergies reviewed; no documented allergy to vancomycin.  Order added for VANCOMYCIN 1 GRAM IV to current preoperative prophylactic regimen.   Patient with orders for both CEFAZOLIN + VANCOMYCIN to be given in the setting of documented GROUP B and D streptococcal isolate in the patient's preoperative urine.   Per office, MD sending in prescription for oral amoxicillin-clavulanate and patient has been made aware of need to pick it up and start as soon as possible. No other preoperative needs identified at this time.   Quentin Mulling, MSN, APRN, FNP-C, CEN [facility]  Peri-operative Services Nurse Practitioner Phone: [phone] 04/04/23 3:41 PM

## 2023-04-04 NOTE — Patient Instructions (Addendum)
Your procedure is scheduled on: 04/05/23 - Thursday Report to the Registration Desk on the 1st floor of the [facility]. To find out your arrival time, please call [phone] between 1PM - 3PM on:04/04/23 - Wednesday If your arrival time is 6:00 am, do not arrive before that time as the [facility] entrance doors do not open until 6:00 am.  REMEMBER: Instructions that are not followed completely may result in serious medical risk, up to and including death; or upon the discretion of your surgeon and anesthesiologist your surgery may need to be rescheduled.  Do not eat food after midnight the night before surgery.  No gum chewing or hard candies.  You may drink water up to 2 hours before you are scheduled to arrive for your surgery. Do not drink anything within 2 hours of your scheduled arrival time.  One week prior to surgery: Stop Anti-inflammatories (NSAIDS) such as Advil, Aleve, Ibuprofen, Motrin, Naproxen, Naprosyn and Aspirin based products such as Excedrin, Goody's Powder, BC Powder.  Stop ANY OVER THE COUNTER supplements until after surgery.  You may however, continue to take Tylenol if needed for pain up until the day of surgery.  Continue taking all prescribed medications with the exception of the following:  We have instructed the patient to hold their blood thinner(s) and/or diabetes medication(s) for surgery as listed below:  - Stop jardiance 3 days prior - Stop meformin 2 days prior - OK to stay on aspirin 81mg  - STOP sildenafil (REVATIO) starting today.     TAKE ONLY THESE MEDICATIONS THE MORNING OF SURGERY WITH A SIP OF WATER:  carvedilol (COREG)     No Alcohol for 24 hours before or after surgery.  No Smoking including e-cigarettes for 24 hours before surgery.  No chewable tobacco products for at least 6 hours before surgery.  No nicotine patches on the day of surgery.  Do not use any "recreational" drugs for at least a week (preferably 2 weeks) before  your surgery.  Please be advised that the combination of cocaine and anesthesia may have negative outcomes, up to and including death. If you test positive for cocaine, your surgery will be cancelled.  On the morning of surgery brush your teeth with toothpaste and water, you may rinse your mouth with mouthwash if you wish. Do not swallow any toothpaste or mouthwash.  Use CHG Soap or wipes as directed on instruction sheet.  Do not wear jewelry, make-up, hairpins, clips or nail polish.  Do not wear lotions, powders, or perfumes.   Do not shave body hair from the neck down 48 hours before surgery.  Contact lenses, hearing aids and dentures may not be worn into surgery.  Do not bring valuables to the hospital. [facility] is not responsible for any missing/lost belongings or valuables.   Notify your doctor if there is any change in your medical condition (cold, fever, infection).  Wear comfortable clothing (specific to your surgery type) to the hospital.  After surgery, you can help prevent lung complications by doing breathing exercises.  Take deep breaths and cough every 1-2 hours. Your doctor may order a device called an Incentive Spirometer to help you take deep breaths. When coughing or sneezing, hold a pillow firmly against your incision with both hands. This is called "splinting." Doing this helps protect your incision. It also decreases belly discomfort.  If you are being admitted to the hospital overnight, leave your suitcase in the car. After surgery it may be brought to your  room.  In case of increased patient census, it may be necessary for you, the patient, to continue your postoperative care in the Same Day Surgery department.  If you are being discharged the day of surgery, you will not be allowed to drive home. You will need a responsible individual to drive you home and stay with you for 24 hours after surgery.   If you are taking public transportation, you will need to  have a responsible individual with you.  Please call the Pre-admissions Testing Dept. at [phone] if you have any questions about these instructions.  Surgery Visitation Policy:  Patients having surgery or a procedure may have two visitors.  Children under the age of 12 must have an adult with them who is not the patient.  Inpatient Visitation:    Visiting hours are 7 a.m. to 8 p.m. Up to four visitors are allowed at one time in a patient room. The visitors may rotate out with other people during the day.  One visitor age 24 or older may stay with the patient overnight and must be in the room by 8 p.m.    Pre-operative 5 CHG Bath Instructions   You can play a key role in reducing the risk of infection after surgery. Your skin needs to be as free of germs as possible. You can reduce the number of germs on your skin by washing with CHG (chlorhexidine gluconate) soap before surgery. CHG is an antiseptic soap that kills germs and continues to kill germs even after washing.   DO NOT use if you have an allergy to chlorhexidine/CHG or antibacterial soaps. If your skin becomes reddened or irritated, stop using the CHG and notify one of our RNs at [phone].   Please shower with the CHG soap starting 4 days before surgery using the following schedule:     Please keep in mind the following:  DO NOT shave, including legs and underarms, starting the day of your first shower.   You may shave your face at any point before/day of surgery.  Place clean sheets on your bed the day you start using CHG soap. Use a clean washcloth (not used since being washed) for each shower. DO NOT sleep with pets once you start using the CHG.   CHG Shower Instructions:  If you choose to wash your hair and private area, wash first with your normal shampoo/soap.  After you use shampoo/soap, rinse your hair and body thoroughly to remove shampoo/soap residue.  Turn the water OFF and apply about 3 tablespoons  (45 ml) of CHG soap to a CLEAN washcloth.  Apply CHG soap ONLY FROM YOUR NECK DOWN TO YOUR TOES (washing for 3-5 minutes)  DO NOT use CHG soap on face, private areas, open wounds, or sores.  Pay special attention to the area where your surgery is being performed.  If you are having back surgery, having someone wash your back for you may be helpful. Wait 2 minutes after CHG soap is applied, then you may rinse off the CHG soap.  Pat dry with a clean towel  Put on clean clothes/pajamas   If you choose to wear lotion, please use ONLY the CHG-compatible lotions on the back of this paper.     Additional instructions for the day of surgery: DO NOT APPLY any lotions, deodorants, cologne, or perfumes.   Put on clean/comfortable clothes.  Brush your teeth.  Ask your nurse before applying any prescription medications to the skin.  CHG Compatible Lotions   Aveeno Moisturizing lotion  Cetaphil Moisturizing Cream  Cetaphil Moisturizing Lotion  Clairol Herbal Essence Moisturizing Lotion, Dry Skin  Clairol Herbal Essence Moisturizing Lotion, Extra Dry Skin  Clairol Herbal Essence Moisturizing Lotion, Normal Skin  Curel Age Defying Therapeutic Moisturizing Lotion with Alpha Hydroxy  Curel Extreme Care Body Lotion  Curel Soothing Hands Moisturizing Hand Lotion  Curel Therapeutic Moisturizing Cream, Fragrance-Free  Curel Therapeutic Moisturizing Lotion, Fragrance-Free  Curel Therapeutic Moisturizing Lotion, Original Formula  Eucerin Daily Replenishing Lotion  Eucerin Dry Skin Therapy Plus Alpha Hydroxy Crme  Eucerin Dry Skin Therapy Plus Alpha Hydroxy Lotion  Eucerin Original Crme  Eucerin Original Lotion  Eucerin Plus Crme Eucerin Plus Lotion  Eucerin TriLipid Replenishing Lotion  Keri Anti-Bacterial Hand Lotion  Keri Deep Conditioning Original Lotion Dry Skin Formula Softly Scented  Keri Deep Conditioning Original Lotion, Fragrance Free Sensitive Skin Formula  Keri Lotion Fast  Absorbing Fragrance Free Sensitive Skin Formula  Keri Lotion Fast Absorbing Softly Scented Dry Skin Formula  Keri Original Lotion  Keri Skin Renewal Lotion Keri Silky Smooth Lotion  Keri Silky Smooth Sensitive Skin Lotion  Nivea Body Creamy Conditioning Oil  Nivea Body Extra Enriched Teacher, adult education Moisturizing Lotion Nivea Crme  Nivea Skin Firming Lotion  NutraDerm 30 Skin Lotion  NutraDerm Skin Lotion  NutraDerm Therapeutic Skin Cream  NutraDerm Therapeutic Skin Lotion  ProShield Protective Hand Cream  Provon moisturizing lotion

## 2023-04-04 NOTE — Progress Notes (Signed)
Had a follow-up phone call just now with Lawrence Holmes.  We found low-grade urinary tract infection.  We discussed this with our preop medicine team.  Given Lawrence Holmes severe weakness in his bilateral lower extremities we discussed with him whether or not he would like to go forward with surgery tomorrow knowing that he would be at slightly increased risk for a infectious complication or if he would like to wait for another week for treatment of his urinary tract infection.  He would prefer to go forward with surgery even in the setting of an increased chance of infection.  Will plan for sending him an antibiotic prescription tonight so he can start his dose and will give extra antibiotics during surgery tomorrow and continue antibiotics after wards.  He agreed with the plan.  Will plan to go forward.

## 2023-04-04 NOTE — H&P (View-Only) (Signed)
Had a follow-up phone call just now with Lawrence Holmes.  We found low-grade urinary tract infection.  We discussed this with our preop medicine team.  Given Lawrence Holmes severe weakness in his bilateral lower extremities we discussed with him whether or not he would like to go forward with surgery tomorrow knowing that he would be at slightly increased risk for a infectious complication or if he would like to wait for another week for treatment of his urinary tract infection.  He would prefer to go forward with surgery even in the setting of an increased chance of infection.  Will plan for sending him an antibiotic prescription tonight so he can start his dose and will give extra antibiotics during surgery tomorrow and continue antibiotics after wards.  He agreed with the plan.  Will plan to go forward.

## 2023-04-05 ENCOUNTER — Ambulatory Visit: Payer: 59 | Admitting: Urgent Care

## 2023-04-05 ENCOUNTER — Ambulatory Visit
Admission: RE | Admit: 2023-04-05 | Discharge: 2023-04-05 | Disposition: A | Discharge status: Home / Self Care | Payer: 59 | Attending: Neurosurgery | Admitting: Neurosurgery

## 2023-04-05 ENCOUNTER — Other Ambulatory Visit: Payer: Self-pay

## 2023-04-05 ENCOUNTER — Encounter
Admission: RE | Disposition: A | Discharge status: Home / Self Care | Payer: Self-pay | Source: Home / Self Care | Attending: Neurosurgery

## 2023-04-05 ENCOUNTER — Ambulatory Visit: Payer: 59

## 2023-04-05 ENCOUNTER — Encounter: Payer: Self-pay | Admitting: Neurosurgery

## 2023-04-05 DIAGNOSIS — I110 Hypertensive heart disease with heart failure: Secondary | ICD-10-CM | POA: Insufficient documentation

## 2023-04-05 DIAGNOSIS — R29898 Other symptoms and signs involving the musculoskeletal system: Secondary | ICD-10-CM

## 2023-04-05 DIAGNOSIS — Z01818 Encounter for other preprocedural examination: Secondary | ICD-10-CM

## 2023-04-05 DIAGNOSIS — I255 Ischemic cardiomyopathy: Secondary | ICD-10-CM | POA: Diagnosis not present

## 2023-04-05 DIAGNOSIS — F17210 Nicotine dependence, cigarettes, uncomplicated: Secondary | ICD-10-CM

## 2023-04-05 DIAGNOSIS — I5022 Chronic systolic (congestive) heart failure: Secondary | ICD-10-CM | POA: Diagnosis not present

## 2023-04-05 DIAGNOSIS — Z01812 Encounter for preprocedural laboratory examination: Secondary | ICD-10-CM

## 2023-04-05 DIAGNOSIS — Z419 Encounter for procedure for purposes other than remedying health state, unspecified: Secondary | ICD-10-CM

## 2023-04-05 DIAGNOSIS — I2510 Atherosclerotic heart disease of native coronary artery without angina pectoris: Secondary | ICD-10-CM | POA: Diagnosis not present

## 2023-04-05 DIAGNOSIS — R8271 Bacteriuria: Secondary | ICD-10-CM

## 2023-04-05 DIAGNOSIS — E785 Hyperlipidemia, unspecified: Secondary | ICD-10-CM

## 2023-04-05 DIAGNOSIS — M5106 Intervertebral disc disorders with myelopathy, lumbar region: Secondary | ICD-10-CM | POA: Diagnosis not present

## 2023-04-05 DIAGNOSIS — M5116 Intervertebral disc disorders with radiculopathy, lumbar region: Secondary | ICD-10-CM | POA: Diagnosis not present

## 2023-04-05 DIAGNOSIS — M48061 Spinal stenosis, lumbar region without neurogenic claudication: Secondary | ICD-10-CM | POA: Insufficient documentation

## 2023-04-05 DIAGNOSIS — Z7984 Long term (current) use of oral hypoglycemic drugs: Secondary | ICD-10-CM | POA: Insufficient documentation

## 2023-04-05 DIAGNOSIS — I252 Old myocardial infarction: Secondary | ICD-10-CM | POA: Diagnosis not present

## 2023-04-05 DIAGNOSIS — I700 Atherosclerosis of aorta: Secondary | ICD-10-CM

## 2023-04-05 DIAGNOSIS — E119 Type 2 diabetes mellitus without complications: Secondary | ICD-10-CM | POA: Insufficient documentation

## 2023-04-05 DIAGNOSIS — Z01810 Encounter for preprocedural cardiovascular examination: Secondary | ICD-10-CM

## 2023-04-05 DIAGNOSIS — I25118 Atherosclerotic heart disease of native coronary artery with other forms of angina pectoris: Secondary | ICD-10-CM

## 2023-04-05 DIAGNOSIS — I509 Heart failure, unspecified: Secondary | ICD-10-CM

## 2023-04-05 DIAGNOSIS — R8290 Unspecified abnormal findings in urine: Secondary | ICD-10-CM

## 2023-04-05 HISTORY — PX: LUMBAR LAMINECTOMY/DECOMPRESSION MICRODISCECTOMY: SHX5026

## 2023-04-05 LAB — GLUCOSE, CAPILLARY
Glucose-Capillary: 135 mg/dL — ABNORMAL HIGH (ref 70–99)
Glucose-Capillary: 137 mg/dL — ABNORMAL HIGH (ref 70–99)

## 2023-04-05 LAB — ABO/RH: ABO/RH(D): O POS

## 2023-04-05 SURGERY — LUMBAR LAMINECTOMY/DECOMPRESSION MICRODISCECTOMY 1 LEVEL
Anesthesia: General | Laterality: Bilateral

## 2023-04-05 MED ORDER — SURGIFLO WITH THROMBIN (HEMOSTATIC MATRIX KIT) OPTIME
TOPICAL | Status: DC | PRN
  Administered 2023-04-05: 1 via TOPICAL

## 2023-04-05 MED ORDER — 0.9 % SODIUM CHLORIDE (POUR BTL) OPTIME
TOPICAL | Status: DC | PRN
  Administered 2023-04-05: 500 mL

## 2023-04-05 MED ORDER — SODIUM CHLORIDE 0.9 % IV SOLN: INTRAVENOUS | Status: DC | PRN

## 2023-04-05 MED ORDER — SODIUM CHLORIDE (PF) 0.9 % IJ SOLN: INTRAMUSCULAR | Status: DC | PRN

## 2023-04-05 MED ORDER — BUPIVACAINE LIPOSOME 1.3 % IJ SUSP
INTRAMUSCULAR | Status: AC
  Filled 2023-04-05: qty 20

## 2023-04-05 MED ORDER — MIDAZOLAM HCL 2 MG/2ML IJ SOLN
INTRAMUSCULAR | Status: DC | PRN
  Administered 2023-04-05: 1 mg via INTRAVENOUS

## 2023-04-05 MED ORDER — FENTANYL CITRATE (PF) 100 MCG/2ML IJ SOLN
INTRAMUSCULAR | Status: AC
  Filled 2023-04-05: qty 2

## 2023-04-05 MED ORDER — GLYCOPYRROLATE 0.2 MG/ML IJ SOLN
INTRAMUSCULAR | Status: DC | PRN
  Administered 2023-04-05: .2 mg via INTRAVENOUS

## 2023-04-05 MED ORDER — OXYCODONE HCL 5 MG/5ML PO SOLN: 5.0000 mg | Freq: Once | ORAL | Status: DC | PRN

## 2023-04-05 MED ORDER — FENTANYL CITRATE (PF) 100 MCG/2ML IJ SOLN: 25.0000 ug | INTRAMUSCULAR | Status: DC | PRN

## 2023-04-05 MED ORDER — PHENYLEPHRINE HCL-NACL 20-0.9 MG/250ML-% IV SOLN
INTRAVENOUS | Status: AC
  Filled 2023-04-05: qty 250

## 2023-04-05 MED ORDER — METHYLPREDNISOLONE ACETATE 40 MG/ML IJ SUSP
INTRAMUSCULAR | Status: DC | PRN
  Administered 2023-04-05: 10 mg

## 2023-04-05 MED ORDER — BUPIVACAINE-EPINEPHRINE (PF) 0.5% -1:200000 IJ SOLN
INTRAMUSCULAR | Status: AC
  Filled 2023-04-05: qty 10

## 2023-04-05 MED ORDER — DEXAMETHASONE SODIUM PHOSPHATE 10 MG/ML IJ SOLN
INTRAMUSCULAR | Status: DC | PRN
  Administered 2023-04-05: 5 mg via INTRAVENOUS

## 2023-04-05 MED ORDER — SUGAMMADEX SODIUM 200 MG/2ML IV SOLN
INTRAVENOUS | Status: DC | PRN
  Administered 2023-04-05: 200 mg via INTRAVENOUS

## 2023-04-05 MED ORDER — FENTANYL CITRATE (PF) 100 MCG/2ML IJ SOLN
INTRAMUSCULAR | Status: DC | PRN
  Administered 2023-04-05: 25 ug via INTRAVENOUS
  Administered 2023-04-05: 50 ug via INTRAVENOUS
  Administered 2023-04-05: 25 ug via INTRAVENOUS

## 2023-04-05 MED ORDER — ROCURONIUM BROMIDE 100 MG/10ML IV SOLN
INTRAVENOUS | Status: DC | PRN
  Administered 2023-04-05: 50 mg via INTRAVENOUS

## 2023-04-05 MED ORDER — GLYCOPYRROLATE 0.2 MG/ML IJ SOLN
INTRAMUSCULAR | Status: AC
  Filled 2023-04-05: qty 1

## 2023-04-05 MED ORDER — FAMOTIDINE 20 MG PO TABS
ORAL_TABLET | ORAL | Status: AC
  Filled 2023-04-05: qty 1

## 2023-04-05 MED ORDER — ACETAMINOPHEN 10 MG/ML IV SOLN
INTRAVENOUS | Status: DC | PRN
  Administered 2023-04-05: 1000 mg via INTRAVENOUS

## 2023-04-05 MED ORDER — CEFAZOLIN SODIUM-DEXTROSE 2-4 GM/100ML-% IV SOLN
INTRAVENOUS | Status: AC
  Filled 2023-04-05: qty 100

## 2023-04-05 MED ORDER — MIDAZOLAM HCL 2 MG/2ML IJ SOLN
INTRAMUSCULAR | Status: AC
  Filled 2023-04-05: qty 2

## 2023-04-05 MED ORDER — ONDANSETRON HCL 4 MG/2ML IJ SOLN
INTRAMUSCULAR | Status: AC
  Filled 2023-04-05: qty 2

## 2023-04-05 MED ORDER — PHENYLEPHRINE HCL-NACL 20-0.9 MG/250ML-% IV SOLN
INTRAVENOUS | Status: DC | PRN
  Administered 2023-04-05: 30 ug/min via INTRAVENOUS
  Administered 2023-04-05: 80 ug/min via INTRAVENOUS

## 2023-04-05 MED ORDER — ONDANSETRON HCL 4 MG/2ML IJ SOLN
INTRAMUSCULAR | Status: DC | PRN
  Administered 2023-04-05: 4 mg via INTRAVENOUS

## 2023-04-05 MED ORDER — SEVOFLURANE IN SOLN
RESPIRATORY_TRACT | Status: AC
  Filled 2023-04-05: qty 250

## 2023-04-05 MED ORDER — DROPERIDOL 2.5 MG/ML IJ SOLN: 0.6250 mg | Freq: Once | INTRAMUSCULAR | Status: DC | PRN

## 2023-04-05 MED ORDER — SENNA 8.6 MG PO TABS
1.0000 | ORAL_TABLET | Freq: Every day | ORAL | 0 refills | Status: DC | PRN
Start: 2023-04-05 — End: 2023-05-15

## 2023-04-05 MED ORDER — ROCURONIUM BROMIDE 10 MG/ML (PF) SYRINGE
PREFILLED_SYRINGE | INTRAVENOUS | Status: AC
  Filled 2023-04-05: qty 10

## 2023-04-05 MED ORDER — PROPOFOL 10 MG/ML IV BOLUS
INTRAVENOUS | Status: AC
  Filled 2023-04-05: qty 40

## 2023-04-05 MED ORDER — METHYLPREDNISOLONE ACETATE 40 MG/ML IJ SUSP
INTRAMUSCULAR | Status: AC
  Filled 2023-04-05: qty 1

## 2023-04-05 MED ORDER — BUPIVACAINE HCL (PF) 0.5 % IJ SOLN
INTRAMUSCULAR | Status: AC
  Filled 2023-04-05: qty 30

## 2023-04-05 MED ORDER — CHLORHEXIDINE GLUCONATE 0.12 % MT SOLN
OROMUCOSAL | Status: AC
  Filled 2023-04-05: qty 15

## 2023-04-05 MED ORDER — SODIUM CHLORIDE FLUSH 0.9 % IV SOLN
INTRAVENOUS | Status: AC
  Filled 2023-04-05: qty 20

## 2023-04-05 MED ORDER — BUPIVACAINE-EPINEPHRINE (PF) 0.5% -1:200000 IJ SOLN
INTRAMUSCULAR | Status: DC | PRN
Start: 2023-04-05 — End: 2023-04-05
  Administered 2023-04-05: 7 mL via PERINEURAL

## 2023-04-05 MED ORDER — ACETAMINOPHEN 10 MG/ML IV SOLN
INTRAVENOUS | Status: AC
  Filled 2023-04-05: qty 100

## 2023-04-05 MED ORDER — ACETAMINOPHEN 10 MG/ML IV SOLN: 1000.0000 mg | Freq: Once | INTRAVENOUS | Status: DC | PRN

## 2023-04-05 MED ORDER — OXYCODONE HCL 5 MG PO TABS: 5.0000 mg | ORAL_TABLET | Freq: Once | ORAL | Status: DC | PRN

## 2023-04-05 MED ORDER — OXYCODONE-ACETAMINOPHEN 7.5-325 MG PO TABS: 1.0000 | ORAL_TABLET | ORAL | 0 refills | Status: DC | PRN

## 2023-04-05 MED ORDER — PROPOFOL 10 MG/ML IV BOLUS
INTRAVENOUS | Status: DC | PRN
Start: 2023-04-05 — End: 2023-04-05
  Administered 2023-04-05: 100 mg via INTRAVENOUS

## 2023-04-05 MED ORDER — PROMETHAZINE HCL 25 MG/ML IJ SOLN: 6.2500 mg | INTRAMUSCULAR | Status: DC | PRN

## 2023-04-05 MED ORDER — SODIUM CHLORIDE (PF) 0.9 % IJ SOLN
INTRAMUSCULAR | Status: DC | PRN
  Administered 2023-04-05: 25 mL

## 2023-04-05 MED ORDER — VANCOMYCIN HCL IN DEXTROSE 1-5 GM/200ML-% IV SOLN
INTRAVENOUS | Status: AC
  Filled 2023-04-05: qty 200

## 2023-04-05 MED ORDER — LIDOCAINE HCL (CARDIAC) PF 100 MG/5ML IV SOSY
PREFILLED_SYRINGE | INTRAVENOUS | Status: DC | PRN
  Administered 2023-04-05: 100 mg via INTRAVENOUS

## 2023-04-05 MED ORDER — SODIUM CHLORIDE 0.9 % IV SOLN: INTRAVENOUS | Status: DC

## 2023-04-05 SURGICAL SUPPLY — 37 items
ADH SKN CLS APL DERMABOND .7 (GAUZE/BANDAGES/DRESSINGS) ×1
AGENT HMST KT MTR STRL THRMB (HEMOSTASIS) ×1
BASIN KIT SINGLE STR (MISCELLANEOUS) ×1 IMPLANT
BRUSH SCRUB EZ 4% CHG (MISCELLANEOUS) ×1 IMPLANT
BUR NEURO DRILL SOFT 3.0X3.8M (BURR) ×1 IMPLANT
DERMABOND ADVANCED .7 DNX12 (GAUZE/BANDAGES/DRESSINGS) ×1 IMPLANT
DRAPE C ARM PK CFD 31 SPINE (DRAPES) ×1 IMPLANT
DRAPE LAPAROTOMY 100X77 ABD (DRAPES) ×1 IMPLANT
DRAPE MICROSCOPE SPINE 48X150 (DRAPES) ×1 IMPLANT
DRSG OPSITE POSTOP 3X4 (GAUZE/BANDAGES/DRESSINGS) ×1 IMPLANT
ELECT EZSTD 165MM 6.5IN (MISCELLANEOUS) ×1
ELECTRODE EZSTD 165MM 6.5IN (MISCELLANEOUS) ×1 IMPLANT
GLOVE SRG 8 PF TXTR STRL LF DI (GLOVE) ×1 IMPLANT
GLOVE SURG SYN 7.5 E (GLOVE) ×2 IMPLANT
GLOVE SURG SYN 7.5 PF PI (GLOVE) ×1 IMPLANT
GLOVE SURG UNDER POLY LF SZ8 (GLOVE) ×1
GOWN STRL REUS W/ TWL XL LVL3 (GOWN DISPOSABLE) ×1 IMPLANT
GOWN STRL REUS W/TWL XL LVL3 (GOWN DISPOSABLE) ×1
GRAFT DURAGEN MATRIX 1WX1L (Tissue) IMPLANT
KIT SPINAL PRONEVIEW (KITS) ×1 IMPLANT
MANIFOLD NEPTUNE II (INSTRUMENTS) ×1 IMPLANT
MARKER SKIN DUAL TIP RULER LAB (MISCELLANEOUS) ×1 IMPLANT
NDL SAFETY ECLIP 18X1.5 (MISCELLANEOUS) IMPLANT
NS IRRIG 1000ML POUR BTL (IV SOLUTION) ×1 IMPLANT
PACK LAMINECTOMY ARMC (PACKS) ×1 IMPLANT
SURGIFLO W/THROMBIN 8M KIT (HEMOSTASIS) ×1 IMPLANT
SUT MNCRL 4-0 (SUTURE) ×1
SUT MNCRL 4-0 27XMFL (SUTURE) ×1
SUT VIC AB 0 CT1 27 (SUTURE) ×1
SUT VIC AB 0 CT1 27XCR 8 STRN (SUTURE) ×1 IMPLANT
SUT VIC AB 2-0 CT1 18 (SUTURE) ×1 IMPLANT
SUTURE MNCRL 4-0 27XMF (SUTURE) ×1 IMPLANT
SYR 10ML LL (SYRINGE) ×1 IMPLANT
SYR 30ML LL (SYRINGE) ×2 IMPLANT
SYR 3ML LL SCALE MARK (SYRINGE) ×1 IMPLANT
TRAP FLUID SMOKE EVACUATOR (MISCELLANEOUS) ×1 IMPLANT
WATER STERILE IRR 1000ML POUR (IV SOLUTION) ×2 IMPLANT

## 2023-04-05 NOTE — Anesthesia Procedure Notes (Signed)
Procedure Name: Intubation Date/Time: 04/05/2023 7:50 AM  Performed by: Chelsea Aus, CRNAPre-anesthesia Checklist: Patient identified, Emergency Drugs available, Suction available and Patient being monitored Patient Re-evaluated:Patient Re-evaluated prior to induction Oxygen Delivery Method: Circle system utilized Preoxygenation: Pre-oxygenation with 100% oxygen Induction Type: IV induction Ventilation: Mask ventilation without difficulty and Oral airway inserted - appropriate to patient size Laryngoscope Size: Mac and 4 Grade View: Grade I Tube type: Oral Tube size: 7.0 mm Number of attempts: 1 Placement Confirmation: ETT inserted through vocal cords under direct vision, positive ETCO2 and breath sounds checked- equal and bilateral Secured at: 23.5 cm Tube secured with: Tape Dental Injury: Teeth and Oropharynx as per pre-operative assessment  Comments: OA 100 mm

## 2023-04-05 NOTE — Discharge Instructions (Addendum)
Your surgeon has performed an operation on your lumbar spine (low back) to relieve pressure on one or more nerves. Many times, patients feel better immediately after surgery and can "overdo it." Even if you feel well, it is important that you follow these activity guidelines. If you do not let your back heal properly from the surgery, you can increase the chance of a disc herniation and/or return of your symptoms. The following are instructions to help in your recovery once you have been discharged from the hospital.  * It is ok to take NSAIDs after surgery.  Activity    No bending, lifting, or twisting ("BLT"). Avoid lifting objects heavier than 10 pounds (gallon milk jug).  Where possible, avoid household activities that involve lifting, bending, pushing, or pulling such as laundry, vacuuming, grocery shopping, and childcare. Try to arrange for help from friends and family for these activities while your back heals.  Increase physical activity slowly as tolerated.  Taking short walks is encouraged, but avoid strenuous exercise. Do not jog, run, bicycle, lift weights, or participate in any other exercises unless specifically allowed by your doctor. Avoid prolonged sitting, including car rides.  Talk to your doctor before resuming sexual activity.  You should not drive until cleared by your doctor.  Until released by your doctor, you should not return to work or school.  You should rest at home and let your body heal.   You may shower three days after your surgery.  After showering, lightly dab your incision dry. Do not take a tub bath or go swimming for 3 weeks, or until approved by your doctor at your follow-up appointment.  If you smoke, we strongly recommend that you quit.  Smoking has been proven to interfere with normal healing in your back and will dramatically reduce the success rate of your surgery. Please contact [facility] (800-QUIT-NOW) and use the resources at www.[facility].com for  assistance in stopping smoking.  Surgical Incision   If you have a dressing on your incision, you may remove it three days after your surgery. Keep your incision area clean and dry.  If you have staples or stitches on your incision, you should have a follow up scheduled for removal. If you do not have staples or stitches, you will have steri-strips (small pieces of surgical tape) or Dermabond glue. The steri-strips/glue should begin to peel away within about a week (it is fine if the steri-strips fall off before then). If the strips are still in place one week after your surgery, you may gently remove them.  Diet            You may return to your usual diet. Be sure to stay hydrated.  When to Contact us  Although your surgery and recovery will likely be uneventful, you may have some residual numbness, aches, and pains in your back and/or legs. This is normal and should improve in the next few weeks.  However, should you experience any of the following, contact us immediately: New numbness or weakness Pain that is progressively getting worse, and is not relieved by your pain medications or rest Bleeding, redness, swelling, pain, or drainage from surgical incision Chills or flu-like symptoms Fever greater than 101.0 F (38.3 C) Problems with bowel or bladder functions Difficulty breathing or shortness of breath Warmth, tenderness, or swelling in your calf  Contact Information How to contact us:  If you have any questions/concerns before or after surgery, you can reach [place] at [phone], or you can  send a [employer]. We can be reached by phone or mychart 8am-4pm, Monday-Friday.  *Please note: Calls after 4pm are forwarded to a third party answering service. Mychart messages are not routinely monitored during evenings, weekends, and holidays. Please call our office to contact the answering service for urgent concerns during non-business hours.   AMBULATORY SURGERY  DISCHARGE  INSTRUCTIONS   The drugs that you were given will stay in your system until tomorrow so for the next 24 hours you should not:  Drive an automobile Make any legal decisions Drink any alcoholic beverage   You may resume regular meals tomorrow.  Today it is better to start with liquids and gradually work up to solid foods.  You may eat anything you prefer, but it is better to start with liquids, then soup and crackers, and gradually work up to solid foods.   Please notify your doctor immediately if you have any unusual bleeding, trouble breathing, redness and pain at the surgery site, drainage, fever, or pain not relieved by medication.    Additional Instructions: PLEASE LEAVE EXPAREL (TEAL) ARMBAND ON FOR 4 DAYS     Please contact your physician with any problems or Same Day Surgery at [phone], Monday through Friday 6 am to 4 pm, or [place] at [facility] number at [phone].

## 2023-04-05 NOTE — Anesthesia Preprocedure Evaluation (Addendum)
Anesthesia Evaluation  Patient identified by MRN, date of birth, ID band Patient awake    Reviewed: Allergy & Precautions, NPO status , Patient's Chart, lab work & pertinent test results, reviewed documented beta blocker date and time   Airway Mallampati: IV  TM Distance: >3 FB Neck ROM: full    Dental  (+) Edentulous Lower, Edentulous Upper   Pulmonary Current SmokerPatient did not abstain from smoking.   Pulmonary exam normal        Cardiovascular Exercise Tolerance: Good METS: hypertension, Pt. on medications + CAD, + Past MI and +CHF  Normal cardiovascular exam  Per cardiology 5/24: PVCs EKG today shows normal sinus rhythm with PVCs, which appears to be a new finding.  Patient denies any palpitations.  He is on Coreg 3.125 mg twice daily.  He reports he had blood work from his PCP, we will request labs from them.  I will order a 2-week heart monitor.  I will increase Coreg to 6.25 mg twice daily.  Will see him back after the heart monitor.   CAD The patient denies any anginal symptoms.  Cath in 2018 showed severe two-vessel CAD, unable to perform angioplasty due to inability to cross the wire.  ETT in 2021 showed no evidence of ischemia.  Will continue medical management with aspirin, statin, and beta-blocker therapy.   HFrEF ICM Most recent echocardiogram showed improved LVEF of 45 to 50%, severe hypokinesis of the inferior basal to mid septal wall, grade 1 diastolic dysfunction, normal RV SF, mild MR. The patient is euvolemic on exam. Continue Coreg, Jardiance, Entresto, and spironolactone.    Mild MR Echo in April 2023 showed mild MR.    Neuro/Psych  Bilateral leg weakness        Intervertebral lumbar disc disorder with myelopathy, lumbar region    Neuromuscular disease  negative psych ROS   GI/Hepatic negative GI ROS, Neg liver ROS,,,  Endo/Other  diabetes, Type 2    Renal/GU      Musculoskeletal  (+)  Arthritis ,    Abdominal Normal abdominal exam  (+)   Peds  Hematology negative hematology ROS (+)   Anesthesia Other Findings Past Medical History: No date: Aortic atherosclerosis (HCC) No date: Arthritis 07/23/2017: Chronic systolic heart failure (HCC)     Comment:  a.) TTE 07/23/2017: EF 30-35%, mild LVH, inferolateral               and inferior AK, mild BAE, G1DD; b.) TTE 10/23/2017: EF               40-45%, mild LVH, basal-mid inferior and inferoseptal AK,              mild MR; c.) TTE 11/19/2021: EF 45-50%, glob HK with mod               to severe inferior and basal to mid septal HK, GLS               -15.3%, mild MR, G1DD. 07/21/2017: Coronary artery disease     Comment:  a. 07/2017 Late presenting inferior STEMI/Cath: LM nl,               LAD min irregs, LCX 100d (L->L collats), RCA 100p (L->R               collats), EF 25-35%-->Med Rx; b. 10/2017 ETT (DOT): Ex               time 7:38, baseline antlat TWI, no acute  changes.  No date: DDD (degenerative disc disease), lumbar No date: Diverticulosis of colon No date: Enlarged prostate No date: Erectile dysfunction     Comment:  a.) on PDE5i (sildenafil) PRN No date: HTN (hypertension) No date: Hx of bilateral cataract extraction No date: Hyperlipidemia No date: Ischemic cardiomyopathy     Comment:  a.) LHC 07/21/2017: EF 25-35%; b.) TTE 07/23/2017:               30-35%; c.) TTE 10/23/2017: EF 40-45%; d.) TTE               11/19/2021: EF 45-50% No date: Left inguinal hernia 07/21/2017: ST elevation myocardial infarction (STEMI) of inferior  wall (HCC)     Comment:  a.) LHC 07/21/2017: EF 25-35%, LVEDP 14 mmHg. 40% pLCx,               40% mLCx, 100% dLCx, 100% pRCA --> attempted to cross               wire at RCA lesion, however unable. Reasonable RCA               collaterals already formed --> med mgmt. No date: T2DM (type 2 diabetes mellitus) (HCC) No date: Tobacco use  Past Surgical History: No date: CATARACT  EXTRACTION; Bilateral 04/04/2019: COLONOSCOPY WITH PROPOFOL; N/A     Comment:  Procedure: COLONOSCOPY WITH PROPOFOL;  Surgeon:               Pasty Spillers, MD;  Location: [facility] ENDOSCOPY;                Service: Endoscopy;  Laterality: N/A; 07/21/2017: LEFT HEART CATH AND CORONARY ANGIOGRAPHY; N/A     Comment:  Procedure: LEFT HEART CATH AND CORONARY ANGIOGRAPHY;                Surgeon: Iran Ouch, MD;  Location: [facility] INVASIVE               CV LAB;  Service: Cardiovascular;  Laterality: N/A;     Reproductive/Obstetrics negative OB ROS                             Anesthesia Physical Anesthesia Plan  ASA: 3  Anesthesia Plan: General   Post-op Pain Management: Ofirmev IV (intra-op)* and Precedex   Induction: Intravenous  PONV Risk Score and Plan: 2 and Dexamethasone, Ondansetron and Midazolam  Airway Management Planned: Oral ETT  Additional Equipment:   Intra-op Plan:   Post-operative Plan: Extubation in OR  Informed Consent: I have reviewed the patients History and Physical, chart, labs and discussed the procedure including the risks, benefits and alternatives for the proposed anesthesia with the patient or authorized representative who has indicated his/her understanding and acceptance.     Dental Advisory Given  Plan Discussed with: Anesthesiologist, CRNA and Surgeon  Anesthesia Plan Comments:         Anesthesia Quick Evaluation

## 2023-04-05 NOTE — Discharge Summary (Signed)
Physician Discharge Summary  Patient ID: Lawrence Holmes MRN: [id number] DOB/AGE: 09-09-1955 67 y.o.  Admit date: 04/05/2023 Discharge date: 04/05/2023  Admission Diagnoses: Lumbar radiculopathy with stenosis and bilateral lower extremity weakness Discharge Diagnoses:  Active Problems:   * No active hospital problems. *   Discharged Condition: good  Hospital Course:  Lawrence Holmes is a 67 y.o presenting with lumbar stenosis and lower extremity weakness.  He underwent right L 2-3 laminectomy and microdiscectomy.  His intraoperative course was uncomplicated.  He was monitored in PACU and discharged home after ambulating, urinating, and tolerating p.o. intake.  He was given prescriptions for pain medication and instructed to resume his home baclofen for muscle relaxation.  Consults: None  Significant Diagnostic Studies: none  Treatments: surgery: Above.  Please see separately dictated operative report for further details.  Discharge Exam: Blood pressure 111/69, pulse 87, temperature 98 F (36.7 C), temperature source Temporal, resp. rate 18, height 6' (1.829 m), weight 97.5 kg, SpO2 97%. CN II-XII grossly intact Unchanged from pre-op Incision covered with clean post-op dressing    Disposition: Discharge disposition: 01-Home or Self Care        Allergies as of 04/05/2023   No Known Allergies      Medication List     STOP taking these medications    traMADol 50 MG tablet Commonly known as: Ultram       TAKE these medications    Accu-Chek Guide test strip Generic drug: glucose blood SMARTSIG:Via Meter 1-4 Times Daily   Accu-Chek Softclix Lancets lancets SMARTSIG:Via Meter 1-4 Times Daily   amoxicillin-clavulanate 875-125 MG tablet Commonly known as: AUGMENTIN Take 1 tablet by mouth 2 (two) times daily.   aspirin 81 MG chewable tablet Chew 1 tablet (81 mg total) by mouth daily.   atorvastatin 80 MG tablet Commonly known as: LIPITOR Take 1 tablet (80 mg  total) by mouth at bedtime.   baclofen 10 MG tablet Commonly known as: LIORESAL Take 0.5-1 tablets (5-10 mg total) by mouth 3 (three) times daily as needed for muscle spasms.   BLOOD GLUCOSE METER DISPOSABLE Devi Check fingerstick blood sugars once a day; E11.69, LON 99 months   blood glucose meter kit and supplies Kit Dispense based on patient and insurance preference. Use up to four times daily as directed. (FOR ICD-9 250.00, 250.01).   carvedilol 6.25 MG tablet Commonly known as: COREG Take 1 tablet (6.25 mg total) by mouth 2 (two) times daily with a meal.   empagliflozin 10 MG Tabs tablet Commonly known as: Jardiance Take 1 tablet (10 mg total) by mouth daily before breakfast.   Entresto 24-26 MG Generic drug: sacubitril-valsartan TAKE 1 TABLET BY MOUTH TWICE DAILY   metFORMIN 500 MG tablet Commonly known as: GLUCOPHAGE Take 1 tablet (500 mg total) by mouth 2 (two) times daily with a meal.   oxyCODONE-acetaminophen 7.5-325 MG tablet Commonly known as: Percocet Take 1 tablet by mouth every 4 (four) hours as needed for severe pain.   senna 8.6 MG Tabs tablet Commonly known as: SENOKOT Take 1 tablet (8.6 mg total) by mouth daily as needed for mild constipation.   sildenafil 20 MG tablet Commonly known as: REVATIO Take 1-5 tablets (20-100 mg total) by mouth daily as needed (use 30 min prior to sexual activity).   spironolactone 25 MG tablet Commonly known as: ALDACTONE TAKE 1 TABLET(25 MG) BY MOUTH DAILY   tamsulosin 0.4 MG Caps capsule Commonly known as: FLOMAX TAKE 1 CAPSULE(0.4 MG) BY MOUTH DAILY  Signed: Susanne Borders 04/05/2023, 9:46 AM

## 2023-04-05 NOTE — Anesthesia Postprocedure Evaluation (Signed)
Anesthesia Post Note  Patient: Lawrence Holmes  Procedure(s) Performed: MINIMALLY INVASIVE (MIS) L2-3 LAMINECTOMY AND DISCECTOMY (Bilateral)  Patient location during evaluation: PACU Anesthesia Type: General Level of consciousness: awake and alert Pain management: pain level controlled Vital Signs Assessment: post-procedure vital signs reviewed and stable Respiratory status: spontaneous breathing, nonlabored ventilation and respiratory function stable Cardiovascular status: blood pressure returned to baseline and stable Postop Assessment: no apparent nausea or vomiting Anesthetic complications: no   No notable events documented.   Last Vitals:  Vitals:   04/05/23 1015 04/05/23 1031  BP: 114/66 128/68  Pulse: (!) 59 84  Resp: 17 18  Temp: (!) 36.2 C 36.4 C  SpO2: 95% 95%    Last Pain:  Vitals:   04/05/23 1031  TempSrc: Oral  PainSc: 0-No pain                 Foye Deer

## 2023-04-05 NOTE — Transfer of Care (Signed)
Immediate Anesthesia Transfer of Care Note  Patient: Lawrence Holmes [place]  Procedure(s) Performed: MINIMALLY INVASIVE (MIS) L2-3 LAMINECTOMY AND DISCECTOMY (Bilateral)  Patient Location: PACU  Anesthesia Type:General  Level of Consciousness: awake, alert , oriented, and patient cooperative  Airway & Oxygen Therapy: Patient Spontanous Breathing and Patient connected to nasal cannula oxygen  Post-op Assessment: Report given to RN and Post -op Vital signs reviewed and stable  Post vital signs: Reviewed and stable  Last Vitals:  Vitals Value Taken Time  BP 116/63 04/05/23 0946  Temp    Pulse 69 04/05/23 0949  Resp 15 04/05/23 0949  SpO2 96 % 04/05/23 0949  Vitals shown include unfiled device data.  Last Pain:  Vitals:   04/05/23 0630  TempSrc: Temporal  PainSc: 0-No pain         Complications: No notable events documented.

## 2023-04-05 NOTE — Interval H&P Note (Signed)
History and Physical Interval Note:  04/05/2023 7:18 AM  Lawrence Holmes  has presented today for surgery, with the diagnosis of Bilateral leg weakness    Intervertebral lumbar disc disorder with myelopathy, lumbar region.  The various methods of treatment have been discussed with the patient and family. After consideration of risks, benefits and other options for treatment, the patient has consented to  Procedure(s): MINIMALLY INVASIVE (MIS) L2-3 LAMINECTOMY AND DISCECTOMY (Bilateral) as a surgical intervention.  The patient's history has been reviewed, patient examined, no change in status, stable for surgery.  I have reviewed the patient's chart and labs.  Questions were answered to the patient's satisfaction.     Loreen Freud

## 2023-04-05 NOTE — Op Note (Signed)
Indications: [name] is suffering from severe lumbar stenosis causing bilateral lower extremity weakness.  Given his severe weakness and severe stenosis we planned for a lumbar laminectomy and discectomy via micro tube.  Findings: Severe ligamentum flavum hypertrophy with a superimposed superiorly migrated disc herniation.  Preoperative Diagnosis:  Bilateral leg weakness Intervertebral lumbar disc disorder with myelopathy, lumbar  Postoperative Diagnosis: same   EBL: 20 ml IVF: see anesthesia record Drains: none Disposition: Extubated and Stable to PACU Complications: none  No foley catheter was placed.   Preoperative Note:   Risks of surgery discussed include: infection, bleeding, stroke, coma, death, paralysis, CSF leak, nerve/spinal cord injury, numbness, tingling, weakness, complex regional pain syndrome, recurrent stenosis and/or disc herniation, vascular injury, development of instability, neck/back pain, need for further surgery, persistent symptoms, development of deformity, and the risks of anesthesia. The patient understood these risks and agreed to proceed.  Operative Note:   1) Right L2/3 Laminectomy and microdiscectomy  The patient was then brought from the preoperative center with intravenous access established.  The patient underwent general anesthesia and endotracheal tube intubation, and was then rotated on the Wilson frame top where all pressure points were appropriately padded.  The skin was then thoroughly cleansed.  Perioperative antibiotic prophylaxis was administered.  Sterile prep and drapes were then applied and a timeout was then observed.  C-arm was brought into the field under sterile conditions, and the L2-3 disc space identified and marked with an incision on the right 1cm lateral to midline.  Once this was complete a 2 cm incision was opened with the use of a #10 blade knife.  The Metrx tubes were sequentially advanced under lateral fluoroscopy  until a 18 x 50 mm Metrx tube was placed over the facet and lamina and secured to the bed.    The microscope was then sterilely brought into the field and muscle creep was hemostased with a bipolar and resected with a pituitary rongeur.  A Bovie extender was then used to expose the spinous process and lamina.  Careful attention was placed to not violate the facet capsule. A 3 mm matchstick drill bit was then used to make a hemi-laminotomy trough until the ligamentum flavum was exposed.  This was extended to the base of the spinous process.  Given his bilateral stenosis we then extended the bone removal to the contralateral side.  We followed the contralateral ligamentum flavum out laterally until it was exposed and a wide enough fashion.  We carried this up cranially until the ligamentum was starting to disconnect from the underside of the lamina.  Once this was complete and the underlying ligamentum flavum was visualized, the ligamentum was dissected with an up angle curette and resected with a #2 and #3 mm biting Kerrison.  The laminectomy opening was also expanded in similar fashion and hemostasis was obtained with Surgifoam and a patty as well as bone wax.  The rostral aspect of the caudal level of the lamina was also resected with a #2 biting Kerrison effort to further enhance exposure.  Once the underlying dura was visualized a Penfield 4 was then used to dissect and expose the traversing nerve root.  Once this was identified a nerve root retractor suction was used to mobilize this medially.  The venous plexus was hemostased with Surgifoam and light bipolar use.  A small penfied was then used to make a small annulotomy within the disc space and disc space contents were noted to come through the annulus.  The disc herniation was identified and dissected free using a balltip probe. The pituitary rongeur was used to remove the extruded disc fragments. Once the thecal sac and nerve root were noted to be  relaxed and under less tension the ball-tipped feeler was passed along the foramen distally to ensure no residual compression was noted.    Depo-Medrol was placed along the nerve root.  The area was irrigated. The tube system was then removed under microscopic visualization and hemostasis was obtained with a bipolar.    The fascial layer was reapproximated with the use of a 0- Vicryl suture.  Subcutaneous tissue layer was reapproximated using 2-0 Vicryl suture. The skin was then cleansed and Dermabond was used to close the skin opening.  Patient was then rotated back to the preoperative bed awakened from anesthesia and taken to recovery all counts are correct in this case.  I performed the procedure in its entirety  Lovenia Kim, MD

## 2023-04-06 ENCOUNTER — Ambulatory Visit: Payer: 59 | Admitting: Family Medicine

## 2023-04-17 NOTE — Progress Notes (Unsigned)
   REFERRING PHYSICIAN:  Danelle Berry, Pa-c [address] [place],  [place] [address]  DOS: 04/05/23  Right L2-L3 Laminectomy and microdiscectomy   HISTORY OF PRESENT ILLNESS: Lawrence Holmes is approximately 2 weeks status post Right L2-L3 Laminectomy and microdiscectomy . Was given percocet 7.5 and advised to restart his home baclofen on discharge from the hospital.   He was given augmentin the day prior to surgery for low grade UTI.   He has minimal LBP. He has no leg pain. He still has some numbness in both legs from his knees down. He is still using a walker, but feels like his walking and strength have improved.   He's only take a few percocet. He is not taking baclofen.   PHYSICAL EXAMINATION:  General: Patient is well developed, well nourished, calm, collected, and in no apparent distress.   NEUROLOGICAL:  General: In no acute distress.   Awake, alert, oriented to person, place, and time.  Pupils equal round and reactive to light.  Facial tone is symmetric.     Strength:           Side Iliopsoas Quads Hamstring PF DF EHL  R 4+ 4+ 4+ 4+ 4+ 4+  L 4+ 4+ 4+ 4 4 2    Incision c/d/i   ROS (Neurologic):  Negative except as noted above  IMAGING: Nothing new to review.   ASSESSMENT/PLAN:  Lawrence Holmes is doing well s/p above surgery. Treatment options reviewed with patient and following plan made:   - I have advised the patient to lift up to 10 pounds until 6 weeks after surgery (follow up with Dr. Katrinka Blazing).  - Reviewed wound care.  - No bending, twisting, or lifting.  - Follow up as scheduled in 4 weeks and prn.   Advised to contact the office if any questions or concerns arise.  Drake Leach PA-C Department of neurosurgery

## 2023-04-18 ENCOUNTER — Encounter: Payer: Self-pay | Admitting: Orthopedic Surgery

## 2023-04-18 ENCOUNTER — Ambulatory Visit (INDEPENDENT_AMBULATORY_CARE_PROVIDER_SITE_OTHER): Payer: 59 | Admitting: Orthopedic Surgery

## 2023-04-18 VITALS — BP 130/80 | Temp 98.0°F | Ht 72.0 in | Wt 215.0 lb

## 2023-04-18 DIAGNOSIS — Z09 Encounter for follow-up examination after completed treatment for conditions other than malignant neoplasm: Secondary | ICD-10-CM

## 2023-04-18 DIAGNOSIS — Z9889 Other specified postprocedural states: Secondary | ICD-10-CM

## 2023-04-18 DIAGNOSIS — M5106 Intervertebral disc disorders with myelopathy, lumbar region: Secondary | ICD-10-CM

## 2023-04-25 NOTE — Progress Notes (Deleted)
Cardiology Office Note    Date:  04/25/2023   ID:  Lawrence Holmes, DOB Jan 15, 1956, MRN [id number]  PCP:  Lawrence Berry, PA-C  Cardiologist:  Lawrence Bears, MD  Electrophysiologist:  None   Chief Complaint: ***  History of Present Illness:   Lawrence Holmes is a 67 y.o. male with history of ***  ***   Labs independently reviewed: 03/2023 - potassium 3.6, BUN 23, serum creatinine 1.03, Hgb 16.4, PLT 290 09/2022 - A1c 6.7 05/2022 - TC 135, TG 176, HDL 24, LDL 84, albumin 4.1, AST/ALT normal  Past Medical History:  Diagnosis Date   Aortic atherosclerosis (HCC)    Arthritis    CHF (congestive heart failure) (HCC)    Chronic systolic heart failure (HCC) 07/23/2017   a.) TTE 07/23/2017: EF 30-35%, mild LVH, inferolateral and inferior AK, mild BAE, G1DD; b.) TTE 10/23/2017: EF 40-45%, mild LVH, basal-mid inferior and inferoseptal AK, mild MR; c.) TTE 11/19/2021: EF 45-50%, glob HK with mod to severe inferior and basal to mid septal HK, GLS -15.3%, mild MR, G1DD.   Coronary artery disease 07/21/2017   a. 07/2017 Late presenting inferior STEMI/Cath: LM nl, LAD min irregs, LCX 100d (L->L collats), RCA 100p (L->R collats), EF 25-35%-->Med Rx; b. 10/2017 ETT (DOT): Ex time 7:38, baseline antlat TWI, no acute changes.    DDD (degenerative disc disease), lumbar    Diverticulosis of colon    Diverticulosis of large intestine without diverticulitis    Enlarged prostate    Erectile dysfunction    a.) on PDE5i (sildenafil) PRN   HTN (hypertension)    Hx of bilateral cataract extraction    Hyperlipidemia    Ischemic cardiomyopathy    a.) LHC 07/21/2017: EF 25-35%; b.) TTE 07/23/2017: 30-35%; c.) TTE 10/23/2017: EF 40-45%; d.) TTE 11/19/2021: EF 45-50%   Left inguinal hernia    Left inguinal hernia 05/09/2022   ST elevation myocardial infarction (STEMI) of inferior wall (HCC) 07/21/2017   a.) LHC 07/21/2017: EF 25-35%, LVEDP 14 mmHg. 40% pLCx, 40% mLCx, 100% dLCx, 100% pRCA --> attempted to cross  wire at RCA lesion, however unable. Reasonable RCA collaterals already formed --> med mgmt.   T2DM (type 2 diabetes mellitus) (HCC)    Tobacco use     Past Surgical History:  Procedure Laterality Date   CATARACT EXTRACTION Bilateral    COLONOSCOPY WITH PROPOFOL N/A 04/04/2019   Procedure: COLONOSCOPY WITH PROPOFOL;  Surgeon: Pasty Spillers, MD;  Location: [facility] ENDOSCOPY;  Service: Endoscopy;  Laterality: N/A;   LEFT HEART CATH AND CORONARY ANGIOGRAPHY N/A 07/21/2017   Procedure: LEFT HEART CATH AND CORONARY ANGIOGRAPHY;  Surgeon: Iran Ouch, MD;  Location: [facility] INVASIVE CV LAB;  Service: Cardiovascular;  Laterality: N/A;   LUMBAR LAMINECTOMY/DECOMPRESSION MICRODISCECTOMY Bilateral 04/05/2023   Procedure: MINIMALLY INVASIVE (MIS) L2-3 LAMINECTOMY AND DISCECTOMY;  Surgeon: Loreen Freud, MD;  Location: [facility] ORS;  Service: Neurosurgery;  Laterality: Bilateral;   XI ROBOTIC ASSISTED INGUINAL HERNIA REPAIR WITH MESH Left 05/19/2022   Procedure: XI ROBOTIC ASSISTED INGUINAL HERNIA REPAIR WITH MESH;  Surgeon: Campbell Lerner, MD;  Location: [facility] ORS;  Service: General;  Laterality: Left;    Current Medications: No outpatient medications have been marked as taking for the 04/26/23 encounter (Appointment) with Sondra Barges, PA-C.    Allergies:   Patient has no known allergies.   Social History   Socioeconomic History   Marital status: Married    Spouse name: Lawrence, Holmes   Number of children: 1  Years of education: Not on file   Highest education level: 12th grade  Occupational History   Not on file  Tobacco Use   Smoking status: Every Day    Current packs/day: 1.00    Average packs/day: 1 pack/day for 48.7 years (48.7 ttl pk-yrs)    Types: Cigarettes    Start date: 08/21/1974    Passive exposure: Current   Smokeless tobacco: Never   Tobacco comments:    Started smoking around 67 y/o has stopped several times  Vaping Use   Vaping status: Never Used  Substance  and Sexual Activity   Alcohol use: Yes    Comment: rare   Drug use: No   Sexual activity: Yes  Other Topics Concern   Not on file  Social History Narrative   Not on file   Social Determinants of Health   Financial Resource Strain: Low Risk  (03/31/2023)   Overall Financial Resource Strain (CARDIA)    Difficulty of Paying Living Expenses: Not hard at all  Food Insecurity: No Food Insecurity (03/31/2023)   Hunger Vital Sign    Worried About Running Out of Food in the Last Year: Never true    Ran Out of Food in the Last Year: Never true  Transportation Needs: No Transportation Needs (03/31/2023)   PRAPARE - Administrator, Civil Service (Medical): No    Lack of Transportation (Non-Medical): No  Physical Activity: Unknown (03/31/2023)   Exercise Vital Sign    Days of Exercise per Week: 0 days    Minutes of Exercise per Session: Not on file  Stress: No Stress Concern Present (03/31/2023)   [employer] of Occupational Health - Occupational Stress Questionnaire    Feeling of Stress : Not at all  Social Connections: Unknown (03/31/2023)   Social Connection and Isolation Panel [NHANES]    Frequency of Communication with Friends and Family: More than three times a week    Frequency of Social Gatherings with Friends and Family: More than three times a week    Attends Religious Services: Patient declined    Database administrator or Organizations: Yes    Attends Banker Meetings: Patient declined    Marital Status: Separated     Family History:  The patient's family history includes Heart attack in his sister; Heart disease in his mother; Hyperlipidemia in his mother; Hypertension in his mother.  ROS:   12-point review of systems is negative unless otherwise noted in the HPI.   EKGs/Labs/Other Studies Reviewed:    Studies reviewed were summarized above. The additional studies were reviewed today:  Zio patch 12/2022: Pending __________  2D echo  12/09/2021: 1. Left ventricular ejection fraction, by estimation, is 45 to 50%. The  left ventricle has mildly decreased function. The left ventricle  demonstrates mild global hypokinesis with moderate to severe hypokinesis  of the inferior and basal to mid septal  wall. Left ventricular diastolic parameters are consistent with Grade I  diastolic dysfunction (impaired relaxation). The average left ventricular  global longitudinal strain is -15.3 %.   2. Right ventricular systolic function is normal. The right ventricular  size is normal. Tricuspid regurgitation signal is inadequate for assessing  PA pressure.   3. The mitral valve is normal in structure. Mild mitral valve  regurgitation. No evidence of mitral stenosis.   4. The aortic valve is tricuspid. Aortic valve regurgitation is not  visualized. No aortic stenosis is present.   5. The inferior vena cava is normal  in size with greater than 50%  respiratory variability, suggesting right atrial pressure of 3 mmHg.  __________  ETT 10/14/2019: Blood pressure demonstrated a normal response to exercise. There was no ST segment deviation noted during stress. No T wave inversion was noted during stress.   Normal treadmill stress test with no evidence of ischemia.  Few PVCs in recovery. Average exercise capacity with exercise time of 6 minutes and 49 seconds achieving 87% maximum predicted heart rate and 8.2 METS. Normal blood pressure at baseline with mild hypertensive response to exercise. __________  ETT 11/05/2017: Blood pressure demonstrated a hypertensive response to exercise. There was no ST segment deviation noted during stress. T wave inversion was noted during stress in the V4, V5 and V6 leads.   Normal treadmill stress test with no evidence of ischemia.  Anterolateral T wave inversion at baseline with no significant change with exercise. Good exercise capacity with exercise duration of 7 minutes and 38 seconds.  Hypertensive  response to exercise. Low risk Duke treadmill score. __________  Limited echo 10/23/2017: - Limited study for evaluation of LVEF.  - Left ventricle: The cavity size was mildly dilated. Wall    thickness was increased in a pattern of mild LVH. Systolic    function was mildly to moderately reduced. The estimated ejection    fraction was in the range of 40% to 45%. There is akinesis of the    basal-midinferior and inferoseptal myocardium.  - Mitral valve: There was mild regurgitation.  - Right ventricle: The cavity size was mildly dilated. Systolic    function was normal.   Recommendations:  LVEF has improved since 07/2017.  __________  2D echo 07/23/2017: - Left ventricle: The cavity size was mildly dilated. There was    mild concentric hypertrophy. Systolic function was moderately to    severely reduced. The estimated ejection fraction was in the    range of 30% to 35%. There is akinesis of the inferolateral and    inferior myocardium. Doppler parameters are consistent with    abnormal left ventricular relaxation (grade 1 diastolic    dysfunction).  - Mitral valve: There was mild to moderate regurgitation.  - Left atrium: The atrium was mildly dilated.  - Right atrium: The atrium was mildly dilated.  __________  [facility] 07/21/2017: Prox Cx lesion is 40% stenosed. Mid Cx lesion is 40% stenosed. Dist Cx lesion is 100% stenosed. Prox RCA lesion is 100% stenosed. There is severe left ventricular systolic dysfunction. LV end diastolic pressure is mildly elevated. The left ventricular ejection fraction is 25-35% by visual estimate. There is trivial (1+) mitral regurgitation.   1.  Severe two-vessel coronary artery disease with total occlusion of proximal right coronary artery with well-developed left-to-right collaterals and occluded distal left circumflex also with collaterals.  No significant obstructive disease affecting the LAD. 2.  Severely reduced LV systolic function with an EF of  25-30% with inferior wall akinesis as well as hypokinesis of the basal anterior wall.  Mildly elevated left ventricular end-diastolic pressure with an LVEDP of 14 mmHg.   3.  Attempted unsuccessful angioplasty of the right coronary artery due to inability to cross with a wire.   Recommendations: This is a late presenting inferior ST elevation myocardial infarction of at least 24 hours duration.  The EKG was with large inferior Q waves.  The left circumflex disease appears to be chronic.  The RCA occlusion appeared to be acute on chronic and thus I decided to probe with a wire  to determine the ability to cross.  I was not able to cross with 2 wires.  There are already reasonable collaterals from the left coronary system.  Recommend medical therapy for coronary artery disease. The patient does have cardiomyopathy which seems to be mixed ischemic and nonischemic and out of proportion to his coronary artery disease. I initiated treatment with carvedilol and losartan.  Consider spironolactone before hospital discharge. The patient needs treatment for diabetes as he has not been on medications.   Delays in door to cath time due to patient's late presentation and equivocal EKG changes on presentation.   EKG:  EKG is ordered today.  The EKG ordered today demonstrates ***  Recent Labs: 05/26/2022: ALT 27 04/02/2023: BUN 23; Creatinine, Ser 1.03; Hemoglobin 16.4; Platelets 290; Potassium 3.6; Sodium 137  Recent Lipid Panel    Component Value Date/Time   CHOL 135 05/26/2022 0936   TRIG 176 (H) 05/26/2022 0936   HDL 24 (L) 05/26/2022 0936   CHOLHDL 5.6 (H) 05/26/2022 0936   VLDL 13 09/12/2017 1559   LDLCALC 84 05/26/2022 0936    PHYSICAL EXAM:    VS:  There were no vitals taken for this visit.  BMI: There is no height or weight on file to calculate BMI.  Physical Exam  Wt Readings from Last 3 Encounters:  04/18/23 215 lb (97.5 kg)  04/05/23 215 lb (97.5 kg)  04/03/23 271 lb 1.6 oz (123 kg)      ASSESSMENT & PLAN:   ***   {Are you ordering a CV Procedure (e.g. stress test, cath, DCCV, TEE, etc)?   Press F2        :[id number]}     Disposition: F/u with Dr. Kirke Corin or an APP in ***.   Medication Adjustments/Labs and Tests Ordered: Current medicines are reviewed at length with the patient today.  Concerns regarding medicines are outlined above. Medication changes, Labs and Tests ordered today are summarized above and listed in the Patient Instructions accessible in Encounters.   Signed, Eula Listen, PA-C 04/25/2023 12:55 PM     [facility] [place] [address] [place], [place] [address] [phone]

## 2023-04-26 ENCOUNTER — Ambulatory Visit: Payer: 59 | Admitting: Physician Assistant

## 2023-05-08 ENCOUNTER — Other Ambulatory Visit: Payer: Self-pay | Admitting: Cardiovascular Disease

## 2023-05-08 ENCOUNTER — Other Ambulatory Visit: Payer: Self-pay

## 2023-05-08 MED ORDER — ATORVASTATIN CALCIUM 80 MG PO TABS: 80.0000 mg | ORAL_TABLET | Freq: Every day | ORAL | 0 refills | Status: DC

## 2023-05-08 NOTE — Telephone Encounter (Signed)
last visit on 12/29/22 with plan to f/u in 6-8 weeks.  Please schedule f/u appt.

## 2023-05-08 NOTE — Telephone Encounter (Signed)
Requested Prescriptions   Signed Prescriptions Disp Refills   atorvastatin (LIPITOR) 80 MG tablet 90 tablet 0    Sig: Take 1 tablet (80 mg total) by mouth at bedtime. Overdue follow up appointment.  PLEASE CALL OFFICE TO SCHEDULE APPOINTMENT PRIOR TO NEXT REFILL    Authorizing Provider: Lorine Bears A    Ordering User: Guerry Minors

## 2023-05-09 ENCOUNTER — Other Ambulatory Visit: Payer: Self-pay

## 2023-05-09 MED ORDER — METFORMIN HCL 500 MG PO TABS: 500.0000 mg | ORAL_TABLET | Freq: Two times a day (BID) | ORAL | 3 refills | Status: DC

## 2023-05-10 ENCOUNTER — Encounter: Payer: Self-pay | Admitting: Family Medicine

## 2023-05-10 ENCOUNTER — Telehealth (INDEPENDENT_AMBULATORY_CARE_PROVIDER_SITE_OTHER): Payer: 59 | Admitting: Family Medicine

## 2023-05-10 DIAGNOSIS — R300 Dysuria: Secondary | ICD-10-CM

## 2023-05-10 DIAGNOSIS — Z87440 Personal history of urinary (tract) infections: Secondary | ICD-10-CM

## 2023-05-10 DIAGNOSIS — R310 Gross hematuria: Secondary | ICD-10-CM | POA: Diagnosis not present

## 2023-05-10 DIAGNOSIS — E119 Type 2 diabetes mellitus without complications: Secondary | ICD-10-CM

## 2023-05-10 DIAGNOSIS — Z7984 Long term (current) use of oral hypoglycemic drugs: Secondary | ICD-10-CM

## 2023-05-10 MED ORDER — CEPHALEXIN 500 MG PO CAPS: 500.0000 mg | ORAL_CAPSULE | Freq: Three times a day (TID) | ORAL | 0 refills | Status: AC

## 2023-05-10 NOTE — Progress Notes (Signed)
Name: Lawrence Holmes [place]   MRN: [id number]    DOB: 1955-09-04   Date:05/10/2023       Progress Note  Subjective:    Chief Complaint  Chief Complaint  Patient presents with   Hematuria    Started yesterday   Dysuria    X2 days ago    I connected with  Hutson Holmes [place]  on 05/10/23 at  8:20 AM EDT by a video enabled telemedicine application and verified that I am speaking with the correct person using two identifiers.  I discussed the limitations of evaluation and management by telemedicine and the availability of in person appointments. The patient expressed understanding and agreed to proceed. Staff also discussed with the patient that there may be a patient responsible charge related to this service. Patient Location: home Provider Location: out of clinic office, private Additional Individuals present: none   Hematuria Associated symptoms include dysuria.  Dysuria  Associated symptoms include hematuria.   Hematuria yesterday and a little dysuria the day before  Since recent surgery no problems with urine flow but maybe some increased frequency   Hx of DM well controlled, on jardiance UTI recently with his surgery/hospitalization, culture reviewed strep bacteria - tx with augmentin, he is on flomax, no other hx of UTI or kidney stones He denies any abd pain or new back pain  No fever chills sweats N/V/Holmes   Patient Active Problem List   Diagnosis Date Noted   Intervertebral lumbar disc disorder with myelopathy, lumbar region 04/02/2023   Impaired functional mobility, balance, gait, and endurance 03/26/2023   Bilateral leg weakness 03/26/2023   Abnormal prostate exam 05/09/2022   Aortic atherosclerosis (HCC) 02/24/2022   Smokes with greater than 30 pack year history 11/18/2021   Coronary artery disease of native artery of native heart with stable angina pectoris (HCC) 05/19/2021   Dyslipidemia 07/30/2020   Polyp of colon    Type 2 diabetes mellitus, controlled (HCC) 02/08/2018    History of myocardial infarction 11/08/2017   Tobacco use 11/08/2017   Ischemic cardiomyopathy 11/08/2017   CHF (congestive heart failure) (HCC) 11/08/2017   Foot callus 11/08/2017   Onychomycosis of multiple toenails with type 2 diabetes mellitus (HCC) 11/08/2017   Cataract, nuclear sclerotic, left eye 04/04/2016    Social History   Tobacco Use   Smoking status: Every Day    Current packs/day: 1.00    Average packs/day: 1 pack/day for 48.7 years (48.7 ttl pk-yrs)    Types: Cigarettes    Start date: 08/21/1974    Passive exposure: Current   Smokeless tobacco: Never   Tobacco comments:    Started smoking around 67 y/o has stopped several times  Substance Use Topics   Alcohol use: Yes    Comment: rare     Current Outpatient Medications:    ACCU-CHEK GUIDE test strip, SMARTSIG:Via Meter 1-4 Times Daily, Disp: , Rfl:    Accu-Chek Softclix Lancets lancets, SMARTSIG:Via Meter 1-4 Times Daily, Disp: , Rfl:    amoxicillin-clavulanate (AUGMENTIN) 875-125 MG tablet, Take 1 tablet by mouth 2 (two) times daily., Disp: 10 tablet, Rfl: 0   aspirin 81 MG chewable tablet, Chew 1 tablet (81 mg total) by mouth daily., Disp: 30 tablet, Rfl: 2   atorvastatin (LIPITOR) 80 MG tablet, Take 1 tablet (80 mg total) by mouth at bedtime. Overdue follow up appointment.  PLEASE CALL OFFICE TO SCHEDULE APPOINTMENT PRIOR TO NEXT REFILL, Disp: 90 tablet, Rfl: 0   baclofen (LIORESAL) 10 MG tablet, Take 0.5-1  tablets (5-10 mg total) by mouth 3 (three) times daily as needed for muscle spasms., Disp: 30 tablet, Rfl: 1   Blood Gluc Meter Disp-Strips (BLOOD GLUCOSE METER DISPOSABLE) DEVI, Check fingerstick blood sugars once a day; E11.69, LON 99 months, Disp: 100 each, Rfl: 1   blood glucose meter kit and supplies KIT, Dispense based on patient and insurance preference. Use up to four times daily as directed. (FOR ICD-9 250.00, 250.01)., Disp: 1 each, Rfl: 0   carvedilol (COREG) 6.25 MG tablet, Take 1 tablet (6.25 mg  total) by mouth 2 (two) times daily with a meal., Disp: 180 tablet, Rfl: 3   empagliflozin (JARDIANCE) 10 MG TABS tablet, Take 1 tablet (10 mg total) by mouth daily before breakfast., Disp: 90 tablet, Rfl: 1   ENTRESTO 24-26 MG, TAKE 1 TABLET BY MOUTH TWICE DAILY, Disp: 60 tablet, Rfl: 0   metFORMIN (GLUCOPHAGE) 500 MG tablet, Take 1 tablet (500 mg total) by mouth 2 (two) times daily with a meal., Disp: 180 tablet, Rfl: 3   oxyCODONE-acetaminophen (PERCOCET) 7.5-325 MG tablet, Take 1 tablet by mouth every 4 (four) hours as needed for severe pain., Disp: 30 tablet, Rfl: 0   senna (SENOKOT) 8.6 MG TABS tablet, Take 1 tablet (8.6 mg total) by mouth daily as needed for mild constipation., Disp: 30 tablet, Rfl: 0   sildenafil (REVATIO) 20 MG tablet, Take 1-5 tablets (20-100 mg total) by mouth daily as needed (use 30 min prior to sexual activity)., Disp: 15 tablet, Rfl: 1   spironolactone (ALDACTONE) 25 MG tablet, TAKE 1 TABLET(25 MG) BY MOUTH DAILY, Disp: 90 tablet, Rfl: 2   tamsulosin (FLOMAX) 0.4 MG CAPS capsule, TAKE 1 CAPSULE(0.4 MG) BY MOUTH DAILY, Disp: 90 capsule, Rfl: 3  No Known Allergies  I personally reviewed active problem list, medication list, allergies, family history, social history, health maintenance, notes from last encounter, lab results, imaging with the patient/caregiver today.   Review of Systems  Constitutional: Negative.   HENT: Negative.    Eyes: Negative.   Respiratory: Negative.    Cardiovascular: Negative.   Gastrointestinal: Negative.   Endocrine: Negative.   Genitourinary:  Positive for dysuria and hematuria.  Musculoskeletal: Negative.   Skin: Negative.   Allergic/Immunologic: Negative.   Neurological: Negative.   Hematological: Negative.   Psychiatric/Behavioral: Negative.    All other systems reviewed and are negative.     Objective:   Virtual encounter, vitals limited, only able to obtain the following There were no vitals filed for this  visit. There is no height or weight on file to calculate BMI. Nursing Note and Vital Signs reviewed.  Physical Exam Vitals and nursing note reviewed.  Constitutional:      General: He is not in acute distress.    Appearance: He is not ill-appearing, toxic-appearing or diaphoretic.  Neurological:     Mental Status: He is alert.     PE limited by virtual encounter  No results found for this or any previous visit (from the past 72 hour(s)).  Assessment and Plan:   1. Dysuria Onset 2 Holmes ago, some frequency for a few weeks - Urinalysis, Routine w reflex microscopic - Urine Culture  2. Gross hematuria yesterday - Urinalysis, Routine w reflex microscopic - Urine Culture  3. Controlled type 2 diabetes mellitus without complication, without long-term current use of insulin (HCC) Controlled Lab Results  Component Value Date   HGBA1C 6.7 (A) 10/03/2022  Sugars at home recent highest reading he has seen was 120 On jardiance -  may increase risk of UTI   4. History of UTI Reviewed recent culture from hospital and meds/augmentin Will cover with keflex and follow culture If this is a second UTI we will try holding his jardiance and retesting and monitoring Any further urinary sx would need urology consult - Urinalysis, Routine w reflex microscopic - Urine Culture   -Red flags and when to present for emergency care or RTC including fever >101.29F, chest pain, shortness of breath, new/worsening/un-resolving symptoms, reviewed with patient at time of visit. Follow up and care instructions discussed and provided in AVS. - I discussed the assessment and treatment plan with the patient. The patient was provided an opportunity to ask questions and all were answered. The patient agreed with the plan and demonstrated an understanding of the instructions.  I provided 20+ minutes of non-face-to-face time during this encounter.  Danelle Berry, PA-C 05/10/23 8:35 AM

## 2023-05-12 ENCOUNTER — Other Ambulatory Visit: Payer: Self-pay

## 2023-05-12 ENCOUNTER — Emergency Department: Payer: 59

## 2023-05-12 ENCOUNTER — Inpatient Hospital Stay
Admission: EM | Admit: 2023-05-12 | Discharge: 2023-05-15 | DRG: 663 | Disposition: A | Discharge status: Home / Self Care | Payer: 59 | Attending: Internal Medicine | Admitting: Internal Medicine

## 2023-05-12 DIAGNOSIS — N1330 Unspecified hydronephrosis: Secondary | ICD-10-CM | POA: Diagnosis present

## 2023-05-12 DIAGNOSIS — J984 Other disorders of lung: Secondary | ICD-10-CM | POA: Diagnosis present

## 2023-05-12 DIAGNOSIS — Z79899 Other long term (current) drug therapy: Secondary | ICD-10-CM

## 2023-05-12 DIAGNOSIS — R319 Hematuria, unspecified: Secondary | ICD-10-CM

## 2023-05-12 DIAGNOSIS — N390 Urinary tract infection, site not specified: Secondary | ICD-10-CM | POA: Diagnosis present

## 2023-05-12 DIAGNOSIS — F17210 Nicotine dependence, cigarettes, uncomplicated: Secondary | ICD-10-CM | POA: Diagnosis present

## 2023-05-12 DIAGNOSIS — N3001 Acute cystitis with hematuria: Secondary | ICD-10-CM

## 2023-05-12 DIAGNOSIS — D62 Acute posthemorrhagic anemia: Secondary | ICD-10-CM | POA: Diagnosis not present

## 2023-05-12 DIAGNOSIS — N3081 Other cystitis with hematuria: Principal | ICD-10-CM | POA: Diagnosis present

## 2023-05-12 DIAGNOSIS — R338 Other retention of urine: Secondary | ICD-10-CM | POA: Diagnosis not present

## 2023-05-12 DIAGNOSIS — N138 Other obstructive and reflux uropathy: Secondary | ICD-10-CM | POA: Diagnosis present

## 2023-05-12 DIAGNOSIS — N3289 Other specified disorders of bladder: Secondary | ICD-10-CM | POA: Diagnosis present

## 2023-05-12 DIAGNOSIS — N529 Male erectile dysfunction, unspecified: Secondary | ICD-10-CM | POA: Diagnosis present

## 2023-05-12 DIAGNOSIS — E1122 Type 2 diabetes mellitus with diabetic chronic kidney disease: Secondary | ICD-10-CM | POA: Diagnosis present

## 2023-05-12 DIAGNOSIS — Z9842 Cataract extraction status, left eye: Secondary | ICD-10-CM

## 2023-05-12 DIAGNOSIS — N400 Enlarged prostate without lower urinary tract symptoms: Secondary | ICD-10-CM | POA: Diagnosis present

## 2023-05-12 DIAGNOSIS — Z720 Tobacco use: Secondary | ICD-10-CM | POA: Diagnosis present

## 2023-05-12 DIAGNOSIS — E785 Hyperlipidemia, unspecified: Secondary | ICD-10-CM | POA: Diagnosis present

## 2023-05-12 DIAGNOSIS — I252 Old myocardial infarction: Secondary | ICD-10-CM

## 2023-05-12 DIAGNOSIS — E1129 Type 2 diabetes mellitus with other diabetic kidney complication: Secondary | ICD-10-CM | POA: Diagnosis present

## 2023-05-12 DIAGNOSIS — N1339 Other hydronephrosis: Secondary | ICD-10-CM | POA: Diagnosis present

## 2023-05-12 DIAGNOSIS — Z83438 Family history of other disorder of lipoprotein metabolism and other lipidemia: Secondary | ICD-10-CM

## 2023-05-12 DIAGNOSIS — Z7982 Long term (current) use of aspirin: Secondary | ICD-10-CM

## 2023-05-12 DIAGNOSIS — I5022 Chronic systolic (congestive) heart failure: Secondary | ICD-10-CM | POA: Diagnosis present

## 2023-05-12 DIAGNOSIS — Z9841 Cataract extraction status, right eye: Secondary | ICD-10-CM

## 2023-05-12 DIAGNOSIS — I2510 Atherosclerotic heart disease of native coronary artery without angina pectoris: Secondary | ICD-10-CM | POA: Diagnosis present

## 2023-05-12 DIAGNOSIS — I130 Hypertensive heart and chronic kidney disease with heart failure and stage 1 through stage 4 chronic kidney disease, or unspecified chronic kidney disease: Secondary | ICD-10-CM | POA: Diagnosis present

## 2023-05-12 DIAGNOSIS — Z8249 Family history of ischemic heart disease and other diseases of the circulatory system: Secondary | ICD-10-CM

## 2023-05-12 DIAGNOSIS — I700 Atherosclerosis of aorta: Secondary | ICD-10-CM | POA: Diagnosis present

## 2023-05-12 DIAGNOSIS — I255 Ischemic cardiomyopathy: Secondary | ICD-10-CM | POA: Diagnosis present

## 2023-05-12 DIAGNOSIS — N401 Enlarged prostate with lower urinary tract symptoms: Secondary | ICD-10-CM | POA: Diagnosis present

## 2023-05-12 DIAGNOSIS — Z7984 Long term (current) use of oral hypoglycemic drugs: Secondary | ICD-10-CM

## 2023-05-12 DIAGNOSIS — I10 Essential (primary) hypertension: Secondary | ICD-10-CM | POA: Diagnosis present

## 2023-05-12 DIAGNOSIS — N1831 Chronic kidney disease, stage 3a: Secondary | ICD-10-CM | POA: Diagnosis present

## 2023-05-12 HISTORY — DX: Hematuria, unspecified: R31.9

## 2023-05-12 LAB — CBC
HCT: 46.7 % (ref 39.0–52.0)
HCT: 39.6 % (ref 39.0–52.0)
Hemoglobin: 15.1 g/dL (ref 13.0–17.0)
Hemoglobin: 13.1 g/dL (ref 13.0–17.0)
MCH: 30.1 pg (ref 26.0–34.0)
MCH: 30.3 pg (ref 26.0–34.0)
MCHC: 32.3 g/dL (ref 30.0–36.0)
MCHC: 33.1 g/dL (ref 30.0–36.0)
MCV: 93.2 fL (ref 80.0–100.0)
MCV: 91.7 fL (ref 80.0–100.0)
Platelets: 321 10*3/uL (ref 150–400)
Platelets: 273 10*3/uL (ref 150–400)
RBC: 5.01 MIL/uL (ref 4.22–5.81)
RBC: 4.32 MIL/uL (ref 4.22–5.81)
RDW: 13.3 % (ref 11.5–15.5)
RDW: 13.2 % (ref 11.5–15.5)
WBC: 9.9 10*3/uL (ref 4.0–10.5)
WBC: 12.3 10*3/uL — ABNORMAL HIGH (ref 4.0–10.5)
nRBC: 0 % (ref 0.0–0.2)
nRBC: 0 % (ref 0.0–0.2)

## 2023-05-12 LAB — BASIC METABOLIC PANEL
Anion gap: 8 (ref 5–15)
BUN: 19 mg/dL (ref 8–23)
CO2: 20 mmol/L — ABNORMAL LOW (ref 22–32)
Calcium: 9.3 mg/dL (ref 8.9–10.3)
Chloride: 106 mmol/L (ref 98–111)
Creatinine, Ser: 1.31 mg/dL — ABNORMAL HIGH (ref 0.61–1.24)
GFR, Estimated: 59 mL/min — ABNORMAL LOW (ref >60)
Glucose, Bld: 130 mg/dL — ABNORMAL HIGH (ref 70–99)
Potassium: 3.7 mmol/L (ref 3.5–5.1)
Sodium: 134 mmol/L — ABNORMAL LOW (ref 135–145)

## 2023-05-12 LAB — TYPE AND SCREEN
ABO/RH(D): O POS
Antibody Screen: NEGATIVE

## 2023-05-12 LAB — BRAIN NATRIURETIC PEPTIDE: B Natriuretic Peptide: 49.6 pg/mL (ref 0.0–100.0)

## 2023-05-12 LAB — GLUCOSE, CAPILLARY: Glucose-Capillary: 144 mg/dL — ABNORMAL HIGH (ref 70–99)

## 2023-05-12 LAB — PROTIME-INR
INR: 1.1 (ref 0.8–1.2)
Prothrombin Time: 14.8 seconds (ref 11.4–15.2)

## 2023-05-12 LAB — APTT: aPTT: 30 seconds (ref 24–36)

## 2023-05-12 MED ORDER — ONDANSETRON HCL 4 MG/2ML IJ SOLN: 4.0000 mg | Freq: Three times a day (TID) | INTRAMUSCULAR | Status: DC | PRN

## 2023-05-12 MED ORDER — NICOTINE 21 MG/24HR TD PT24
21.0000 mg | MEDICATED_PATCH | Freq: Every day | TRANSDERMAL | Status: DC
  Administered 2023-05-12 – 2023-05-15 (×4): 21 mg via TRANSDERMAL
  Filled 2023-05-12 (×4): qty 1

## 2023-05-12 MED ORDER — CEPHALEXIN 500 MG PO CAPS
500.0000 mg | ORAL_CAPSULE | Freq: Three times a day (TID) | ORAL | Status: DC
  Administered 2023-05-12 – 2023-05-15 (×9): 500 mg via ORAL
  Filled 2023-05-12 (×9): qty 1

## 2023-05-12 MED ORDER — SODIUM CHLORIDE 0.9 % IR SOLN
3000.0000 mL | Status: DC
  Administered 2023-05-12 (×3): 6000 mL
  Administered 2023-05-13: 3000 mL
  Administered 2023-05-13: 6000 mL
  Administered 2023-05-13 – 2023-05-14 (×17): 3000 mL

## 2023-05-12 MED ORDER — HYDRALAZINE HCL 20 MG/ML IJ SOLN: 5.0000 mg | INTRAMUSCULAR | Status: DC | PRN

## 2023-05-12 MED ORDER — ATORVASTATIN CALCIUM 20 MG PO TABS
80.0000 mg | ORAL_TABLET | Freq: Every day | ORAL | Status: DC
  Administered 2023-05-12 – 2023-05-14 (×3): 80 mg via ORAL
  Filled 2023-05-12 (×3): qty 4

## 2023-05-12 MED ORDER — ACETAMINOPHEN 325 MG PO TABS: 650.0000 mg | ORAL_TABLET | Freq: Four times a day (QID) | ORAL | Status: DC | PRN

## 2023-05-12 MED ORDER — OXYCODONE-ACETAMINOPHEN 7.5-325 MG PO TABS: 1.0000 | ORAL_TABLET | ORAL | Status: DC | PRN

## 2023-05-12 MED ORDER — FINASTERIDE 5 MG PO TABS
5.0000 mg | ORAL_TABLET | Freq: Every day | ORAL | Status: DC
  Administered 2023-05-12 – 2023-05-15 (×4): 5 mg via ORAL
  Filled 2023-05-12 (×5): qty 1

## 2023-05-12 MED ORDER — SPIRONOLACTONE 25 MG PO TABS
25.0000 mg | ORAL_TABLET | Freq: Every day | ORAL | Status: DC
  Administered 2023-05-13 – 2023-05-15 (×2): 25 mg via ORAL
  Filled 2023-05-12 (×2): qty 1

## 2023-05-12 MED ORDER — CARVEDILOL 3.125 MG PO TABS
6.2500 mg | ORAL_TABLET | Freq: Two times a day (BID) | ORAL | Status: DC
  Administered 2023-05-14 – 2023-05-15 (×4): 6.25 mg via ORAL
  Filled 2023-05-12 (×4): qty 2

## 2023-05-12 MED ORDER — INSULIN ASPART 100 UNIT/ML IJ SOLN
0.0000 [IU] | Freq: Three times a day (TID) | INTRAMUSCULAR | Status: DC
  Administered 2023-05-15: 1 [IU] via SUBCUTANEOUS
  Filled 2023-05-12: qty 1

## 2023-05-12 MED ORDER — LIDOCAINE HCL URETHRAL/MUCOSAL 2 % EX GEL
1.0000 | Freq: Once | CUTANEOUS | Status: AC
  Administered 2023-05-12: 1 via URETHRAL
  Filled 2023-05-12: qty 6

## 2023-05-12 MED ORDER — TAMSULOSIN HCL 0.4 MG PO CAPS
0.4000 mg | ORAL_CAPSULE | Freq: Every day | ORAL | Status: DC
  Administered 2023-05-12 – 2023-05-15 (×4): 0.4 mg via ORAL
  Filled 2023-05-12 (×4): qty 1

## 2023-05-12 MED ORDER — SACUBITRIL-VALSARTAN 24-26 MG PO TABS
1.0000 | ORAL_TABLET | Freq: Two times a day (BID) | ORAL | Status: DC
  Administered 2023-05-12 – 2023-05-15 (×5): 1 via ORAL
  Filled 2023-05-12 (×7): qty 1

## 2023-05-12 MED ORDER — INSULIN ASPART 100 UNIT/ML IJ SOLN: 0.0000 [IU] | Freq: Every day | INTRAMUSCULAR | Status: DC

## 2023-05-12 NOTE — ED Notes (Signed)
Two Foley bags are in tandem per Dr. Arita Miss.

## 2023-05-12 NOTE — ED Provider Notes (Signed)
[facility] Provider Note    Event Date/Time   First MD Initiated Contact with Patient 05/12/23 1503     (approximate)   History   Flank Pain and Hematuria   HPI  Lawrence Holmes [place] is a 67 y.o. male with history of hyperlipidemia, diverticulosis, type 2 diabetes, CAD, hypertension presents emergency department with hematuria and abdominal pain.  Patient states he has barely been able to urinate and it was mostly clots earlier today.  Now cannot urinate and is having pressure that radiates into his back.  No history of kidney stones.  Denies fever or chills.  No trauma      Physical Exam   Triage Vital Signs: ED Triage Vitals  Encounter Vitals Group     BP 05/12/23 1441 110/71     Systolic BP Percentile --      Diastolic BP Percentile --      Pulse Rate 05/12/23 1441 82     Resp 05/12/23 1441 16     Temp 05/12/23 1441 97.6 F (36.4 C)     Temp Source 05/12/23 1441 Oral     SpO2 05/12/23 1441 94 %     Weight 05/12/23 1439 215 lb (97.5 kg)     Height 05/12/23 1439 6' (1.829 m)     Head Circumference --      Peak Flow --      Pain Score 05/12/23 1439 9     Pain Loc --      Pain Education --      Exclude from Growth Chart --     Most recent vital signs: Vitals:   05/12/23 1441  BP: 110/71  Pulse: 82  Resp: 16  Temp: 97.6 F (36.4 C)  SpO2: 94%     General: Awake, no distress.   CV:  Good peripheral perfusion. regular rate and  rhythm Resp:  Normal effort. Lungs cta Abd:  No distention.  Abdomen tender at the suprapubic area into the right lower quadrant left lower quadrant Other:  Bladder scan shows over 300 mL of urine   ED Results / Procedures / Treatments   Labs (all labs ordered are listed, but only abnormal results are displayed) Labs Reviewed  BASIC METABOLIC PANEL - Abnormal; Notable for the following components:      Result Value   Sodium 134 (*)    CO2 20 (*)    Glucose, Bld 130 (*)    Creatinine, Ser 1.31 (*)    GFR,  Estimated 59 (*)    All other components within normal limits  CBC  URINALYSIS, W/ REFLEX TO CULTURE (INFECTION SUSPECTED)  PROTIME-INR  APTT  TYPE AND SCREEN     EKG     RADIOLOGY CT renal stone, bladder scan    PROCEDURES:   Procedures   MEDICATIONS ORDERED IN ED: Medications  sodium chloride irrigation 0.9 % 3,000 mL (has no administration in time range)  finasteride (PROSCAR) tablet 5 mg (has no administration in time range)  tamsulosin (FLOMAX) capsule 0.4 mg (has no administration in time range)  nicotine (NICODERM CQ - dosed in mg/24 hours) patch 21 mg (has no administration in time range)  ondansetron (ZOFRAN) injection 4 mg (has no administration in time range)  hydrALAZINE (APRESOLINE) injection 5 mg (has no administration in time range)  acetaminophen (TYLENOL) tablet 650 mg (has no administration in time range)  lidocaine (XYLOCAINE) 2 % jelly 1 Application (1 Application Urethral Given 05/12/23 1720)     IMPRESSION /  MDM / ASSESSMENT AND PLAN / ED COURSE  I reviewed the triage vital signs and the nursing notes.                              Differential diagnosis includes, but is not limited to, urinary retention, hematuria, bladder cancer, kidney stone  Patient's presentation is most consistent with acute presentation with potential threat to life or bodily function.   Patient has blood noted in a urine cup, blood noted on his pants.  Due to this we ordered a CT renal stone along with bladder scan and catheter.  Will have nursing staff put in a coud  Creatinine is elevated at 1.31 and GFR is below 59, remainder of the metabolic panel and CBC are reassuring   CT scan renal stone study, did independently review and interpret the images.  Bladder appears to be very large has large hematoma.  Radiologist states he does not see a stone but does have hydronephrosis which may be due to the urinary retention.  Bladder scan shows 350 mL, I did order a Foley  catheter  Consult to urology.  Spoke with Dr. Arita Miss.  She would like for [place] to have the urology cart at bedside.  She is coming in see.  Would like the largest Foley catheter possible and would like for it to be 24 inch.  Uro-Jet was also ordered.  Dr. Arita Miss says she has him on CBI.  Will need to be admitted for medicine.  She will continue to check on him with the CBI and hopefully be able to discharge tomorrow.  Consult to hospitalist for admission  Spoke with Dr.Niu, he will be admitting the patient.  Patient is hemodynamically stable at this time.  Patient is being admitted for acute urinary retention, hematuria, and CBI.  FINAL CLINICAL IMPRESSION(S) / ED DIAGNOSES   Final diagnoses:  Acute urinary retention  Hematuria, unspecified type  Other hydronephrosis     Rx / DC Orders   ED Discharge Orders     None        Note:  This document was prepared using Dragon voice recognition software and may include unintentional dictation errors.    Faythe Ghee, PA-C 05/12/23 Gwynneth Aliment, MD 05/12/23 Paulo Fruit

## 2023-05-12 NOTE — ED Triage Notes (Addendum)
Pt to ED c/o hematuria x 1 week and bilat flank pain, reports went to PCP for this 1 week ago and was started on ABX, symptoms not getting better. Reports burning with urination. Not on blood thinner

## 2023-05-12 NOTE — Consult Note (Signed)
I have been asked to see the patient by Dr. Lorretta Harp for evaluation and management of urinary retention with imaging showing significant clot and distention.  History of present illness: 67 year old man with a history of BPH with LUTS last seen at [facility] in December 2023 presented to the ED with urinary retention and gross hematuria.  CT the abdomen pelvis showed a distended bladder with significant clot in the bladder with bilateral hydro.  Patient has been unable to urinate for over 12 hours.  He has had difficulty in the past with urination following inguinal hernia surgery.  He was started on tamsulosin by Dr. Richardo Hanks.  Prostate size 80 g with median lobe. He has significant tobacco history (1ppd >25 yrs).  He also recently underwent a L2-3 laminectomy and microdiscectomy in August 2024.   Review of systems: A 12 point comprehensive review of systems was obtained and is negative unless otherwise stated in the history of present illness.  Patient Active Problem List   Diagnosis Date Noted   Intervertebral lumbar disc disorder with myelopathy, lumbar region 04/02/2023   Impaired functional mobility, balance, gait, and endurance 03/26/2023   Bilateral leg weakness 03/26/2023   Abnormal prostate exam 05/09/2022   Aortic atherosclerosis (HCC) 02/24/2022   Smokes with greater than 30 pack year history 11/18/2021   Coronary artery disease of native artery of native heart with stable angina pectoris (HCC) 05/19/2021   Dyslipidemia 07/30/2020   Polyp of colon    Type 2 diabetes mellitus, controlled (HCC) 02/08/2018   History of myocardial infarction 11/08/2017   Tobacco use 11/08/2017   Ischemic cardiomyopathy 11/08/2017   CHF (congestive heart failure) (HCC) 11/08/2017   Foot callus 11/08/2017   Onychomycosis of multiple toenails with type 2 diabetes mellitus (HCC) 11/08/2017   Cataract, nuclear sclerotic, left eye 04/04/2016    No current facility-administered  medications on file prior to encounter.   Current Outpatient Medications on File Prior to Encounter  Medication Sig Dispense Refill   ACCU-CHEK GUIDE test strip SMARTSIG:Via Meter 1-4 Times Daily     Accu-Chek Softclix Lancets lancets SMARTSIG:Via Meter 1-4 Times Daily     aspirin 81 MG chewable tablet Chew 1 tablet (81 mg total) by mouth daily. 30 tablet 2   atorvastatin (LIPITOR) 80 MG tablet Take 1 tablet (80 mg total) by mouth at bedtime. Overdue follow up appointment.  PLEASE CALL OFFICE TO SCHEDULE APPOINTMENT PRIOR TO NEXT REFILL 90 tablet 0   baclofen (LIORESAL) 10 MG tablet Take 0.5-1 tablets (5-10 mg total) by mouth 3 (three) times daily as needed for muscle spasms. 30 tablet 1   Blood Gluc Meter Disp-Strips (BLOOD GLUCOSE METER DISPOSABLE) DEVI Check fingerstick blood sugars once a day; E11.69, LON 99 months 100 each 1   blood glucose meter kit and supplies KIT Dispense based on patient and insurance preference. Use up to four times daily as directed. (FOR ICD-9 250.00, 250.01). 1 each 0   carvedilol (COREG) 6.25 MG tablet Take 1 tablet (6.25 mg total) by mouth 2 (two) times daily with a meal. 180 tablet 3   cephALEXin (KEFLEX) 500 MG capsule Take 1 capsule (500 mg total) by mouth 3 (three) times daily for 7 days. 21 capsule 0   empagliflozin (JARDIANCE) 10 MG TABS tablet Take 1 tablet (10 mg total) by mouth daily before breakfast. 90 tablet 1   ENTRESTO 24-26 MG TAKE 1 TABLET BY MOUTH TWICE DAILY 60 tablet 0   metFORMIN (GLUCOPHAGE) 500 MG tablet Take 1  tablet (500 mg total) by mouth 2 (two) times daily with a meal. 180 tablet 3   oxyCODONE-acetaminophen (PERCOCET) 7.5-325 MG tablet Take 1 tablet by mouth every 4 (four) hours as needed for severe pain. 30 tablet 0   senna (SENOKOT) 8.6 MG TABS tablet Take 1 tablet (8.6 mg total) by mouth daily as needed for mild constipation. 30 tablet 0   sildenafil (REVATIO) 20 MG tablet Take 1-5 tablets (20-100 mg total) by mouth daily as needed (use  30 min prior to sexual activity). 15 tablet 1   spironolactone (ALDACTONE) 25 MG tablet TAKE 1 TABLET(25 MG) BY MOUTH DAILY 90 tablet 2   tamsulosin (FLOMAX) 0.4 MG CAPS capsule TAKE 1 CAPSULE(0.4 MG) BY MOUTH DAILY 90 capsule 3    Past Medical History:  Diagnosis Date   Aortic atherosclerosis (HCC)    Arthritis    CHF (congestive heart failure) (HCC)    Chronic systolic heart failure (HCC) 07/23/2017   a.) TTE 07/23/2017: EF 30-35%, mild LVH, inferolateral and inferior AK, mild BAE, G1DD; b.) TTE 10/23/2017: EF 40-45%, mild LVH, basal-mid inferior and inferoseptal AK, mild MR; c.) TTE 11/19/2021: EF 45-50%, glob HK with mod to severe inferior and basal to mid septal HK, GLS -15.3%, mild MR, G1DD.   Coronary artery disease 07/21/2017   a. 07/2017 Late presenting inferior STEMI/Cath: LM nl, LAD min irregs, LCX 100d (L->L collats), RCA 100p (L->R collats), EF 25-35%-->Med Rx; b. 10/2017 ETT (DOT): Ex time 7:38, baseline antlat TWI, no acute changes.    DDD (degenerative disc disease), lumbar    Diverticulosis of colon    Diverticulosis of large intestine without diverticulitis    Enlarged prostate    Erectile dysfunction    a.) on PDE5i (sildenafil) PRN   HTN (hypertension)    Hx of bilateral cataract extraction    Hyperlipidemia    Ischemic cardiomyopathy    a.) LHC 07/21/2017: EF 25-35%; b.) TTE 07/23/2017: 30-35%; c.) TTE 10/23/2017: EF 40-45%; d.) TTE 11/19/2021: EF 45-50%   Left inguinal hernia    Left inguinal hernia 05/09/2022   ST elevation myocardial infarction (STEMI) of inferior wall (HCC) 07/21/2017   a.) LHC 07/21/2017: EF 25-35%, LVEDP 14 mmHg. 40% pLCx, 40% mLCx, 100% dLCx, 100% pRCA --> attempted to cross wire at RCA lesion, however unable. Reasonable RCA collaterals already formed --> med mgmt.   T2DM (type 2 diabetes mellitus) (HCC)    Tobacco use     Past Surgical History:  Procedure Laterality Date   CATARACT EXTRACTION Bilateral    COLONOSCOPY WITH PROPOFOL N/A  04/04/2019   Procedure: COLONOSCOPY WITH PROPOFOL;  Surgeon: Pasty Spillers, MD;  Location: [facility] ENDOSCOPY;  Service: Endoscopy;  Laterality: N/A;   LEFT HEART CATH AND CORONARY ANGIOGRAPHY N/A 07/21/2017   Procedure: LEFT HEART CATH AND CORONARY ANGIOGRAPHY;  Surgeon: Iran Ouch, MD;  Location: [facility] INVASIVE CV LAB;  Service: Cardiovascular;  Laterality: N/A;   LUMBAR LAMINECTOMY/DECOMPRESSION MICRODISCECTOMY Bilateral 04/05/2023   Procedure: MINIMALLY INVASIVE (MIS) L2-3 LAMINECTOMY AND DISCECTOMY;  Surgeon: Loreen Freud, MD;  Location: [facility] ORS;  Service: Neurosurgery;  Laterality: Bilateral;   XI ROBOTIC ASSISTED INGUINAL HERNIA REPAIR WITH MESH Left 05/19/2022   Procedure: XI ROBOTIC ASSISTED INGUINAL HERNIA REPAIR WITH MESH;  Surgeon: Campbell Lerner, MD;  Location: [facility] ORS;  Service: General;  Laterality: Left;    Social History   Tobacco Use   Smoking status: Every Day    Current packs/day: 1.00    Average packs/day: 1 pack/day  for 48.7 years (48.7 ttl pk-yrs)    Types: Cigarettes    Start date: 08/21/1974    Passive exposure: Current   Smokeless tobacco: Never   Tobacco comments:    Started smoking around 67 y/o has stopped several times  Vaping Use   Vaping status: Never Used  Substance Use Topics   Alcohol use: Yes    Comment: rare   Drug use: No    Family History  Problem Relation Age of Onset   Heart disease Mother    Hyperlipidemia Mother    Hypertension Mother    Heart attack Sister     PE: Vitals:   05/12/23 1439 05/12/23 1441  BP:  110/71  Pulse:  82  Resp:  16  Temp:  97.6 F (36.4 C)  TempSrc:  Oral  SpO2:  94%  Weight: 97.5 kg   Height: 6' (1.829 m)    Patient appears to be in no acute distress  patient is alert and oriented x3 Atraumatic normocephalic head No increased work of breathing, no audible wheezes/rhonchi GU circ phallus, foreskin retract easily, blood at meatus Lower extremities are symmetric without appreciable  edema Grossly neurologically intact No identifiable skin lesions  Recent Labs    05/12/23 1443  WBC 9.9  HGB 15.1  HCT 46.7   Recent Labs    05/12/23 1443  NA 134*  K 3.7  CL 106  CO2 20*  GLUCOSE 130*  BUN 19  CREATININE 1.31*  CALCIUM 9.3   No results for input(s): "LABPT", "INR" in the last 72 hours. No results for input(s): "LABURIN" in the last 72 hours. Results for orders placed or performed in visit on 05/10/23  MICROSCOPIC MESSAGE     Status: None   Collection Time: 05/10/23  3:04 PM  Result Value Ref Range Status   Note   Final    Comment: This urine was analyzed for the presence of WBC,  RBC, bacteria, casts, and other formed elements.  Only those elements seen were reported. . .     Imaging: CT abd/pelvis 05/12/23 IMPRESSION: Distended urinary bladder, which contains a large amount of blood clot.   Mild bilateral hydronephrosis and ureterectasis, likely due to the distended state of the bladder. No evidence of urolithiasis. Mild-to-moderately enlarged prostate.   Electronically Signed   By: Danae Orleans M.D.   On: 05/12/2023 16:13  Procedure -24Fr 3way hematuria foley placed without difficulty under sterile conditions. 5L sterile saline then used to irrigate clot until urine cleared to light red and clots could no longer be removed. Continuous bladder irrigation initiated.  Imp/Recommendations: Gross hematuria Urinary retention B/l hydronephrosis  -recommend admission -continuous bladder irrigation, wean to light pink/clear -hand irrigate as necessary -will likely d/c home with foley given degree of retention (b/l) hydro -trend Hgb  -will need outpatient cysto and void trial -start finasteride -continue tamsulosin   Thank you for involving me in this patient's care.  Please page with any further questions or concerns. Hesham Womac D Dimple Bastyr

## 2023-05-12 NOTE — ED Notes (Signed)
Urologist at bedside.

## 2023-05-12 NOTE — ED Notes (Signed)
Patient received a dinner tray. Two attempts were made to contact the RN on the floor, but was unavailable

## 2023-05-12 NOTE — ED Notes (Signed)
Dr. Arita Miss began continuous irrigation. AC called for more bags.

## 2023-05-12 NOTE — ED Notes (Signed)
Urology cart is outside of room.

## 2023-05-12 NOTE — H&P (Signed)
History and Physical    Lawrence Holmes [place] QQV:[id number] DOB: 04/25/1956 DOA: 05/12/2023  Referring MD/NP/PA:   PCP: Danelle Berry, PA-C   Patient coming from:  The patient is coming from home.     Chief Complaint: Hematuria, flank pain, difficulty urinating, dysuria  HPI: Lawrence Holmes is a 67 y.o. male with medical history significant of BPH, sCHF with EF 45-50%, HTN, HLD, DM, BPH, CKD-3a, smoker, who presents with hematuria, flank pain, difficulty urinating and dysuria.  Patient states that he has blood in urine, bilateral flank pain, dysuria, burning on urination for more than 3 days.  The flank pain is constant, aching, mild, nonradiating, not alleviated or aggravated by any known factors.  He was seen by PCP on 9/19 and was diagnosed with UTI, started on Keflex.  Patient states that he feels better in terms of dysuria and burning on urination, but he has developed difficulty urinating in the past 12 hours.  No fever or chills. Patient denies chest pain, cough, shortness of breath.  No nausea, vomiting, diarrhea or abdominal pain.  Data reviewed independently and ED Course: pt was found to have hemoglobin 15.1, WBC 9.9, worsening renal function with creatinine 1.31, BUN 19, GFR 59 (recent baseline creatinine 1.03 on 04/02/2023), temperature normal, blood pressure 110/71, heart rate 82, RR 16, oxygen saturation 94% on room air.  Patient is placed on telemetry bed for observation.  Dr. Arita Miss of urology is consulted and started CBI by Dr. Arita Miss.   CT-renal stone protocol: Distended urinary bladder, which contains a large amount of blood clot. Mild bilateral hydronephrosis and ureterectasis, likely due to the distended state of the bladder.   No evidence of urolithiasis.   Mild-to-moderately enlarged prostate.      EKG: Not done in ED, will get one.     Review of Systems:   General: no fevers, chills, no body weight gain, has fatigue HEENT: no blurry vision, hearing changes or sore  throat Respiratory: no dyspnea, coughing, wheezing CV: no chest pain, no palpitations GI: no nausea, vomiting, abdominal pain, diarrhea, constipation GU: has dysuria, burning on urination, increased urinary frequency, hematuria, difficulty urinating Ext: no leg edema Neuro: no unilateral weakness, numbness, or tingling, no vision change or hearing loss Skin: no rash, no skin tear. MSK: No muscle spasm, no deformity, no limitation of range of movement in spin.  Has bilateral flank pain Heme: No easy bruising.  Travel history: No recent long distant travel.   Allergy: No Known Allergies  Past Medical History:  Diagnosis Date   Aortic atherosclerosis (HCC)    Arthritis    CHF (congestive heart failure) (HCC)    Chronic systolic heart failure (HCC) 07/23/2017   a.) TTE 07/23/2017: EF 30-35%, mild LVH, inferolateral and inferior AK, mild BAE, G1DD; b.) TTE 10/23/2017: EF 40-45%, mild LVH, basal-mid inferior and inferoseptal AK, mild MR; c.) TTE 11/19/2021: EF 45-50%, glob HK with mod to severe inferior and basal to mid septal HK, GLS -15.3%, mild MR, G1DD.   Coronary artery disease 07/21/2017   a. 07/2017 Late presenting inferior STEMI/Cath: LM nl, LAD min irregs, LCX 100d (L->L collats), RCA 100p (L->R collats), EF 25-35%-->Med Rx; b. 10/2017 ETT (DOT): Ex time 7:38, baseline antlat TWI, no acute changes.    DDD (degenerative disc disease), lumbar    Diverticulosis of colon    Diverticulosis of large intestine without diverticulitis    Enlarged prostate    Erectile dysfunction    a.) on PDE5i (sildenafil) PRN  HTN (hypertension)    Hx of bilateral cataract extraction    Hyperlipidemia    Ischemic cardiomyopathy    a.) LHC 07/21/2017: EF 25-35%; b.) TTE 07/23/2017: 30-35%; c.) TTE 10/23/2017: EF 40-45%; d.) TTE 11/19/2021: EF 45-50%   Left inguinal hernia    Left inguinal hernia 05/09/2022   ST elevation myocardial infarction (STEMI) of inferior wall (HCC) 07/21/2017   a.) LHC  07/21/2017: EF 25-35%, LVEDP 14 mmHg. 40% pLCx, 40% mLCx, 100% dLCx, 100% pRCA --> attempted to cross wire at RCA lesion, however unable. Reasonable RCA collaterals already formed --> med mgmt.   T2DM (type 2 diabetes mellitus) (HCC)    Tobacco use     Past Surgical History:  Procedure Laterality Date   CATARACT EXTRACTION Bilateral    COLONOSCOPY WITH PROPOFOL N/A 04/04/2019   Procedure: COLONOSCOPY WITH PROPOFOL;  Surgeon: Pasty Spillers, MD;  Location: [facility] ENDOSCOPY;  Service: Endoscopy;  Laterality: N/A;   LEFT HEART CATH AND CORONARY ANGIOGRAPHY N/A 07/21/2017   Procedure: LEFT HEART CATH AND CORONARY ANGIOGRAPHY;  Surgeon: Iran Ouch, MD;  Location: [facility] INVASIVE CV [facility];  Service: Cardiovascular;  Laterality: N/A;   LUMBAR LAMINECTOMY/DECOMPRESSION MICRODISCECTOMY Bilateral 04/05/2023   Procedure: MINIMALLY INVASIVE (MIS) L2-3 LAMINECTOMY AND DISCECTOMY;  Surgeon: Loreen Freud, MD;  Location: [facility] ORS;  Service: Neurosurgery;  Laterality: Bilateral;   XI ROBOTIC ASSISTED INGUINAL HERNIA REPAIR WITH MESH Left 05/19/2022   Procedure: XI ROBOTIC ASSISTED INGUINAL HERNIA REPAIR WITH MESH;  Surgeon: Campbell Lerner, MD;  Location: [facility] ORS;  Service: General;  Laterality: Left;    Social History:  reports that he has been smoking cigarettes. He started smoking about 48 years ago. He has a 48.7 pack-year smoking history. He has been exposed to tobacco smoke. He has never used smokeless tobacco. He reports current alcohol use. He reports that he does not use drugs.  Family History:  Family History  Problem Relation Age of Onset   Heart disease Mother    Hyperlipidemia Mother    Hypertension Mother    Heart attack Sister      Prior to Admission medications   Medication Sig Start Date End Date Taking? Authorizing Provider  ACCU-CHEK GUIDE test strip SMARTSIG:Via Meter 1-4 Times Daily 11/25/21   [provider]  Accu-Chek Softclix Lancets lancets SMARTSIG:Via  Meter 1-4 Times Daily 11/25/21   [provider]  aspirin 81 MG chewable tablet Chew 1 tablet (81 mg total) by mouth daily. 07/24/17   Shaune Pollack, MD  atorvastatin (LIPITOR) 80 MG tablet Take 1 tablet (80 mg total) by mouth at bedtime. Overdue follow up appointment.  PLEASE CALL OFFICE TO SCHEDULE APPOINTMENT PRIOR TO NEXT REFILL 05/08/23   Iran Ouch, MD  baclofen (LIORESAL) 10 MG tablet Take 0.5-1 tablets (5-10 mg total) by mouth 3 (three) times daily as needed for muscle spasms. 03/26/23   Danelle Berry, PA-C  Blood Gluc Meter Disp-Strips (BLOOD GLUCOSE METER DISPOSABLE) DEVI Check fingerstick blood sugars once a day; E11.69, LON 99 months 05/13/18   Kerman Passey, MD  blood glucose meter kit and supplies KIT Dispense based on patient and insurance preference. Use up to four times daily as directed. (FOR ICD-9 250.00, 250.01). 11/23/21   Danelle Berry, PA-C  carvedilol (COREG) 6.25 MG tablet Take 1 tablet (6.25 mg total) by mouth 2 (two) times daily with a meal. 12/29/22   Furth, Cadence H, PA-C  cephALEXin (KEFLEX) 500 MG capsule Take 1 capsule (500 mg total) by mouth 3 (  three) times daily for 7 days. 05/10/23 05/17/23  Danelle Berry, PA-C  empagliflozin (JARDIANCE) 10 MG TABS tablet Take 1 tablet (10 mg total) by mouth daily before breakfast. 10/03/22   Danelle Berry, PA-C  ENTRESTO 24-26 MG TAKE 1 TABLET BY MOUTH TWICE DAILY 05/08/23   Iran Ouch, MD  metFORMIN (GLUCOPHAGE) 500 MG tablet Take 1 tablet (500 mg total) by mouth 2 (two) times daily with a meal. 05/09/23   Danelle Berry, PA-C  oxyCODONE-acetaminophen (PERCOCET) 7.5-325 MG tablet Take 1 tablet by mouth every 4 (four) hours as needed for severe pain. 04/05/23   Susanne Borders, PA  senna (SENOKOT) 8.6 MG TABS tablet Take 1 tablet (8.6 mg total) by mouth daily as needed for mild constipation. 04/05/23   Susanne Borders, PA  sildenafil (REVATIO) 20 MG tablet Take 1-5 tablets (20-100 mg total) by mouth daily as needed (use 30 min  prior to sexual activity). 06/14/22   Sondra Come, MD  spironolactone (ALDACTONE) 25 MG tablet TAKE 1 TABLET(25 MG) BY MOUTH DAILY 11/28/22   Iran Ouch, MD  tamsulosin (FLOMAX) 0.4 MG CAPS capsule TAKE 1 CAPSULE(0.4 MG) BY MOUTH DAILY 08/15/22   Sondra Come, MD    Physical Exam: Vitals:   05/12/23 1439 05/12/23 1441 05/12/23 1909  BP:  110/71 (!) 128/57  Pulse:  82 81  Resp:  16 16  Temp:  97.6 F (36.4 C) 97.8 F (36.6 C)  TempSrc:  Oral Oral  SpO2:  94% 100%  Weight: 97.5 kg    Height: 6' (1.829 m)     General: Not in acute distress HEENT:       Eyes: PERRL, EOMI, no jaundice       ENT: No discharge from the ears and nose, no pharynx injection, no tonsillar enlargement.        Neck: No JVD, no bruit, no mass felt. Heme: No neck lymph node enlargement. Cardiac: S1/S2, RRR, No murmurs, No gallops or rubs. Respiratory: No rales, wheezing, rhonchi or rubs. GI: Soft, nondistended, nontender, no rebound pain, no organomegaly, BS present. GU: has hematuria, with positive CVA tenderness bilaterally Ext: No pitting leg edema bilaterally. 1+DP/PT pulse bilaterally. Musculoskeletal: No joint deformities, No joint redness or warmth, no limitation of ROM in spin. Skin: No rashes.  Neuro: Alert, oriented X3, cranial nerves II-XII grossly intact, moves all extremities normally.  Psych: Patient is not psychotic, no suicidal or hemocidal ideation.  Labs on Admission: I have personally reviewed following labs and imaging studies  CBC: Recent Labs  Lab 05/12/23 1443  WBC 9.9  HGB 15.1  HCT 46.7  MCV 93.2  PLT 321   Basic Metabolic Panel: Recent Labs  Lab 05/12/23 1443  NA 134*  K 3.7  CL 106  CO2 20*  GLUCOSE 130*  BUN 19  CREATININE 1.31*  CALCIUM 9.3   GFR: Estimated Creatinine Clearance: 65.3 mL/min (A) (by C-G formula based on SCr of 1.31 mg/dL (H)). Liver Function Tests: No results for input(s): "AST", "ALT", "ALKPHOS", "BILITOT", "PROT", "ALBUMIN"  in the last 168 hours. No results for input(s): "LIPASE", "AMYLASE" in the last 168 hours. No results for input(s): "AMMONIA" in the last 168 hours. Coagulation Profile: No results for input(s): "INR", "PROTIME" in the last 168 hours. Cardiac Enzymes: No results for input(s): "CKTOTAL", "CKMB", "CKMBINDEX", "TROPONINI" in the last 168 hours. BNP (last 3 results) No results for input(s): "PROBNP" in the last 8760 hours. HbA1C: No results for input(s): "HGBA1C" in the  last 72 hours. CBG: No results for input(s): "GLUCAP" in the last 168 hours. Lipid Profile: No results for input(s): "CHOL", "HDL", "LDLCALC", "TRIG", "CHOLHDL", "LDLDIRECT" in the last 72 hours. Thyroid Function Tests: No results for input(s): "TSH", "T4TOTAL", "FREET4", "T3FREE", "THYROIDAB" in the last 72 hours. Anemia Panel: No results for input(s): "VITAMINB12", "FOLATE", "FERRITIN", "TIBC", "IRON", "RETICCTPCT" in the last 72 hours. Urine analysis:    Component Value Date/Time   COLORURINE ORANGE (A) 05/10/2023 1504   APPEARANCEUR TURBID (A) 05/10/2023 1504   LABSPEC 1.024 05/10/2023 1504   PHURINE > OR = 9.0 (A) 05/10/2023 1504   GLUCOSEU 1+ (A) 05/10/2023 1504   HGBUR 2+ (A) 05/10/2023 1504   BILIRUBINUR NEGATIVE 04/02/2023 1650   KETONESUR NEGATIVE 05/10/2023 1504   PROTEINUR 3+ (A) 05/10/2023 1504   NITRITE POSITIVE (A) 05/10/2023 1504   LEUKOCYTESUR 3+ (A) 05/10/2023 1504   Sepsis Labs: @LABRCNTIP (procalcitonin:4,lacticidven:4) ) Recent Results (from the past 240 hour(s))  MICROSCOPIC MESSAGE     Status: None   Collection Time: 05/10/23  3:04 PM  Result Value Ref Range Status   Note   Final    Comment: This urine was analyzed for the presence of WBC,  RBC, bacteria, casts, and other formed elements.  Only those elements seen were reported. . .      Radiological Exams on Admission: CT Renal Stone Study  Result Date: 05/12/2023 CLINICAL DATA:  Bilateral flank pain and hematuria for 1 week.  Dysuria. EXAM: CT ABDOMEN AND PELVIS WITHOUT CONTRAST TECHNIQUE: Multidetector CT imaging of the abdomen and pelvis was performed following the standard protocol without IV contrast. RADIATION DOSE REDUCTION: This exam was performed according to the departmental dose-optimization program which includes automated exposure control, adjustment of the mA and/or kV according to patient size and/or use of iterative reconstruction technique. COMPARISON:  05/07/2022 FINDINGS: Lower chest: No acute findings. Hepatobiliary: No mass visualized on this unenhanced exam. Gallbladder is unremarkable. No evidence of biliary ductal dilatation. Pancreas: No mass or inflammatory process visualized on this unenhanced exam. Spleen:  Within normal limits in size. Adrenals/Urinary tract: No evidence of urolithiasis. The urinary bladder is distended and contains a large amount of high attenuation material consistent with blood clot. Mild bilateral pelvicaliectasis and ureterectasis is seen to the level of the bladder, likely due to the distended state of the bladder. Stomach/Bowel: No evidence of obstruction, inflammatory process, or abnormal fluid collections. Vascular/Lymphatic: No pathologically enlarged lymph nodes identified. No evidence of abdominal aortic aneurysm. Reproductive:  Mild-to-moderately enlarged prostate gland. Other:  None. Musculoskeletal:  No suspicious bone lesions identified. IMPRESSION: Distended urinary bladder, which contains a large amount of blood clot. Mild bilateral hydronephrosis and ureterectasis, likely due to the distended state of the bladder. No evidence of urolithiasis. Mild-to-moderately enlarged prostate. Electronically Signed   By: Danae Orleans M.D.   On: 05/12/2023 16:13      Assessment/Plan Principal Problem:   Hematuria Active Problems:   Acute urinary retention   Bilateral hydronephrosis   Chronic systolic CHF (congestive heart failure) (HCC)   UTI (urinary tract infection)   CAD  (coronary artery disease)   BPH (benign prostatic hyperplasia)   HTN (hypertension)   HLD (hyperlipidemia)   Type II diabetes mellitus with renal manifestations (HCC)   Chronic kidney disease, stage 3a (HCC)   Tobacco use   Assessment and Plan:  Hematuria, acute urinary retention, and bilateral hydronephrosis: Hgb stable 15.1. Consulted Dr. Arita Miss of urology, who started pt on CBI after placement of Foley cath.   -  will place in tele bed for obe -on CBI and monitoring electrolytes with BMP -Proscar and Flomax -check INR, PTT and type screen -Trend hemoglobin levels, CBC every 8 hours  UTI (urinary tract infection): Patient had positive urinalysis on 9/19 with turbid appearance, 3+ leukocyte, positive nitrite, many bacteria, WBC 10-20.  Patient is on Keflex -Will continue Keflex -Repeat urinalysis and follow-up urine culture  Chronic systolic CHF (congestive heart failure) (HCC): 2D echo on 12/09/2021 showed EF of 45-50% with grade 1 diastolic dysfunction.  Patient does not have leg edema or shortness of breath.  CHF seem to be compensated. -Check BNP -Will hold spironolactone tonight and restart it tomorrow morning due to worsening renal function  BPH (benign prostatic hyperplasia): With obstruction. -Flomax and Proscar  CAD: with hx of STEMI. No CP -hold ASA due to hematuria -continue lipitor  HTN (hypertension) -Coreg, spironolactone -IV hydralazine as needed  HLD (hyperlipidemia) -Lipitor  Type II diabetes mellitus with renal manifestations [facility]): Recent A1c 6.7, well-controlled.  Patent is taking Jardiance and metformin -Sliding scale insulin  Chronic kidney disease, stage 3a (HCC): Slightly worsening than baseline. -Hold spironolactone tonight -Follow-up with BMP  Tobacco use -Nicotine patch -Encourage quitting smoking       DVT ppx: SCD  Code Status: Full code    Family Communication: I offered to call his family, but patient states that he has already  called his brother. He said I do not need to call his family.  Disposition Plan:  Anticipate discharge back to previous environment  Consults called:    Dr. Arita Miss of urology is consulted   Admission status and Level of care: Telemetry Medical:   for obs     Dispo: The patient is from: Home              Anticipated d/c is to: Home              Anticipated d/c date is: 1 day              Patient currently is not medically stable to d/c.    Severity of Illness:  The appropriate patient status for this patient is OBSERVATION. Observation status is judged to be reasonable and necessary in order to provide the required intensity of service to ensure the patient's safety. The patient's presenting symptoms, physical exam findings, and initial radiographic and laboratory data in the context of their medical condition is felt to place them at decreased risk for further clinical deterioration. Furthermore, it is anticipated that the patient will be medically stable for discharge from the hospital within 2 midnights of admission.        Date of Service 05/12/2023    Lorretta Harp [facility]   If 7PM-7AM, please contact night-coverage [email or web address] 05/12/2023, 7:27 PM

## 2023-05-13 DIAGNOSIS — I252 Old myocardial infarction: Secondary | ICD-10-CM | POA: Diagnosis not present

## 2023-05-13 DIAGNOSIS — Z83438 Family history of other disorder of lipoprotein metabolism and other lipidemia: Secondary | ICD-10-CM | POA: Diagnosis not present

## 2023-05-13 DIAGNOSIS — E785 Hyperlipidemia, unspecified: Secondary | ICD-10-CM | POA: Diagnosis present

## 2023-05-13 DIAGNOSIS — Z9842 Cataract extraction status, left eye: Secondary | ICD-10-CM | POA: Diagnosis not present

## 2023-05-13 DIAGNOSIS — R310 Gross hematuria: Secondary | ICD-10-CM

## 2023-05-13 DIAGNOSIS — J984 Other disorders of lung: Secondary | ICD-10-CM | POA: Diagnosis present

## 2023-05-13 DIAGNOSIS — I130 Hypertensive heart and chronic kidney disease with heart failure and stage 1 through stage 4 chronic kidney disease, or unspecified chronic kidney disease: Secondary | ICD-10-CM | POA: Diagnosis present

## 2023-05-13 DIAGNOSIS — Z7984 Long term (current) use of oral hypoglycemic drugs: Secondary | ICD-10-CM | POA: Diagnosis not present

## 2023-05-13 DIAGNOSIS — N138 Other obstructive and reflux uropathy: Secondary | ICD-10-CM | POA: Diagnosis present

## 2023-05-13 DIAGNOSIS — Z8249 Family history of ischemic heart disease and other diseases of the circulatory system: Secondary | ICD-10-CM | POA: Diagnosis not present

## 2023-05-13 DIAGNOSIS — I700 Atherosclerosis of aorta: Secondary | ICD-10-CM | POA: Diagnosis present

## 2023-05-13 DIAGNOSIS — I5022 Chronic systolic (congestive) heart failure: Secondary | ICD-10-CM | POA: Diagnosis present

## 2023-05-13 DIAGNOSIS — N1831 Chronic kidney disease, stage 3a: Secondary | ICD-10-CM | POA: Diagnosis present

## 2023-05-13 DIAGNOSIS — Z79899 Other long term (current) drug therapy: Secondary | ICD-10-CM | POA: Diagnosis not present

## 2023-05-13 DIAGNOSIS — Z9841 Cataract extraction status, right eye: Secondary | ICD-10-CM | POA: Diagnosis not present

## 2023-05-13 DIAGNOSIS — N1339 Other hydronephrosis: Secondary | ICD-10-CM | POA: Diagnosis present

## 2023-05-13 DIAGNOSIS — E1122 Type 2 diabetes mellitus with diabetic chronic kidney disease: Secondary | ICD-10-CM | POA: Diagnosis present

## 2023-05-13 DIAGNOSIS — N529 Male erectile dysfunction, unspecified: Secondary | ICD-10-CM | POA: Diagnosis present

## 2023-05-13 DIAGNOSIS — N3289 Other specified disorders of bladder: Secondary | ICD-10-CM | POA: Diagnosis not present

## 2023-05-13 DIAGNOSIS — N401 Enlarged prostate with lower urinary tract symptoms: Secondary | ICD-10-CM | POA: Diagnosis present

## 2023-05-13 DIAGNOSIS — N3081 Other cystitis with hematuria: Secondary | ICD-10-CM | POA: Diagnosis present

## 2023-05-13 DIAGNOSIS — D62 Acute posthemorrhagic anemia: Secondary | ICD-10-CM | POA: Diagnosis not present

## 2023-05-13 DIAGNOSIS — I255 Ischemic cardiomyopathy: Secondary | ICD-10-CM | POA: Diagnosis present

## 2023-05-13 DIAGNOSIS — I2510 Atherosclerotic heart disease of native coronary artery without angina pectoris: Secondary | ICD-10-CM | POA: Diagnosis present

## 2023-05-13 DIAGNOSIS — Z7982 Long term (current) use of aspirin: Secondary | ICD-10-CM | POA: Diagnosis not present

## 2023-05-13 DIAGNOSIS — R338 Other retention of urine: Secondary | ICD-10-CM | POA: Diagnosis present

## 2023-05-13 DIAGNOSIS — F17210 Nicotine dependence, cigarettes, uncomplicated: Secondary | ICD-10-CM | POA: Diagnosis present

## 2023-05-13 LAB — GLUCOSE, CAPILLARY
Glucose-Capillary: 96 mg/dL (ref 70–99)
Glucose-Capillary: 129 mg/dL — ABNORMAL HIGH (ref 70–99)
Glucose-Capillary: 129 mg/dL — ABNORMAL HIGH (ref 70–99)
Glucose-Capillary: 129 mg/dL — ABNORMAL HIGH (ref 70–99)

## 2023-05-13 LAB — URINALYSIS, ROUTINE W REFLEX MICROSCOPIC
Bilirubin Urine: NEGATIVE
Ketones, ur: NEGATIVE
Nitrite: POSITIVE — AB
Specific Gravity, Urine: 1.024 (ref 1.001–1.035)
Squamous Epithelial / HPF: NONE SEEN /HPF (ref <=5)
pH: >=9 — AB (ref 5.0–8.0)

## 2023-05-13 LAB — BASIC METABOLIC PANEL
Anion gap: 8 (ref 5–15)
BUN: 22 mg/dL (ref 8–23)
CO2: 21 mmol/L — ABNORMAL LOW (ref 22–32)
Calcium: 9 mg/dL (ref 8.9–10.3)
Chloride: 107 mmol/L (ref 98–111)
Creatinine, Ser: 1.22 mg/dL (ref 0.61–1.24)
GFR, Estimated: >60 mL/min (ref >60)
Glucose, Bld: 170 mg/dL — ABNORMAL HIGH (ref 70–99)
Potassium: 4.4 mmol/L (ref 3.5–5.1)
Sodium: 136 mmol/L (ref 135–145)

## 2023-05-13 LAB — CBC
HCT: 37.6 % — ABNORMAL LOW (ref 39.0–52.0)
Hemoglobin: 12.8 g/dL — ABNORMAL LOW (ref 13.0–17.0)
MCH: 30.9 pg (ref 26.0–34.0)
MCHC: 34 g/dL (ref 30.0–36.0)
MCV: 90.8 fL (ref 80.0–100.0)
Platelets: 264 10*3/uL (ref 150–400)
RBC: 4.14 MIL/uL — ABNORMAL LOW (ref 4.22–5.81)
RDW: 13.2 % (ref 11.5–15.5)
WBC: 11.2 10*3/uL — ABNORMAL HIGH (ref 4.0–10.5)
nRBC: 0 % (ref 0.0–0.2)

## 2023-05-13 LAB — URINE CULTURE
MICRO NUMBER:: 15489994
SPECIMEN QUALITY:: ADEQUATE

## 2023-05-13 LAB — HIV ANTIBODY (ROUTINE TESTING W REFLEX): HIV Screen 4th Generation wRfx: NONREACTIVE

## 2023-05-13 LAB — MICROSCOPIC MESSAGE

## 2023-05-13 MED ORDER — CHLORHEXIDINE GLUCONATE CLOTH 2 % EX PADS
6.0000 | MEDICATED_PAD | Freq: Every day | CUTANEOUS | Status: DC
  Administered 2023-05-13 – 2023-05-15 (×2): 6 via TOPICAL

## 2023-05-13 NOTE — Progress Notes (Signed)
PROGRESS NOTE    Lawrence Holmes [place]  QIO:[id number] DOB: 21-Apr-1956 DOA: 05/12/2023 PCP: Danelle Berry, PA-C    Brief Narrative:  67 y.o. male with medical history significant of BPH, sCHF with EF 45-50%, HTN, HLD, DM, BPH, CKD-3a, smoker, who presents with hematuria, flank pain, difficulty urinating and dysuria.   Patient states that he has blood in urine, bilateral flank pain, dysuria, burning on urination for more than 3 days.  The flank pain is constant, aching, mild, nonradiating, not alleviated or aggravated by any known factors.  He was seen by PCP on 9/19 and was diagnosed with UTI, started on Keflex.  Patient states that he feels better in terms of dysuria and burning on urination, but he has developed difficulty urinating in the past 12 hours.  No fever or chills. Patient denies chest pain, cough, shortness of breath.  No nausea, vomiting, diarrhea or abdominal pain.  9/22: Urology consulted.  CBI initiated.  On 9/22 urine still running pink.  Discussed with urology.  Possible plan for surgical intervention 9/23.   Assessment & Plan:   Principal Problem:   Hematuria Active Problems:   Acute urinary retention   Bilateral hydronephrosis   Chronic systolic CHF (congestive heart failure) (HCC)   UTI (urinary tract infection)   CAD (coronary artery disease)   BPH (benign prostatic hyperplasia)   HTN (hypertension)   HLD (hyperlipidemia)   Type II diabetes mellitus with renal manifestations (HCC)   Chronic kidney disease, stage 3a (HCC)   Tobacco use Hematuria, acute urinary retention, and bilateral hydronephrosis: Hgb stable 15.1. Consulted Dr. Arita Miss of urology, who started pt on CBI after placement of Foley cath.  Plan: Continue pleural scarring Flomax.  Continue CBI.  Monitor hemoglobin levels.  Transfuse as needed hemoglobin less than 8.  N.p.o. after midnight for possible surgical intervention 9/23.   UTI (urinary tract infection): Patient had positive urinalysis on 9/19 with  turbid appearance, 3+ leukocyte, positive nitrite, many bacteria, WBC 10-20.  Patient is on Keflex Plan: Continue Keflex Follow-up repeat urine culture   Chronic systolic CHF (congestive heart failure) (HCC): 2D echo on 12/09/2021 showed EF of 45-50% with grade 1 diastolic dysfunction.  Patient does not have leg edema or shortness of breath.  CHF seem to be compensated. Plan: Resume spironolactone    BPH (benign prostatic hyperplasia): With obstruction. -Flomax and Proscar   CAD: with hx of STEMI. No CP -hold ASA due to hematuria -continue lipitor   HTN (hypertension) -Coreg, spironolactone -IV hydralazine as needed   HLD (hyperlipidemia) -Lipitor   Type II diabetes mellitus with renal manifestations [facility]): Recent A1c 6.7, well-controlled.  Patent is taking Jardiance and metformin -Sliding scale insulin   Chronic kidney disease, stage 3a (HCC): Slightly worsening than baseline. -Spironolactone resumed   Tobacco use -Nicotine patch -Encourage quitting smoking      DVT prophylaxis: SCD Code Status: Full Family Communication: None today Disposition Plan: Status is: Observation The patient will require care spanning > 2 midnights and should be moved to inpatient because: Acute hematuria.  Acute blood loss anemia.  Possible surgical intervention 9/23.   Level of care: Telemetry Medical  Consultants:  Urology  Procedures:  Foley catheter/CBI  Antimicrobials: Keflex   Subjective: And examined.  Reports improvement of symptoms since admission.  Tolerating CBI.  Objective: Vitals:   05/12/23 1441 05/12/23 1909 05/13/23 0057 05/13/23 0807  BP: 110/71 (!) 128/57 109/60 (!) 90/58  Pulse: 82 81 73 88  Resp: 16 16 20 18   Temp:  97.6 F (36.4 C) 97.8 F (36.6 C) 98 F (36.7 C) 97.7 F (36.5 C)  TempSrc: Oral Oral    SpO2: 94% 100% 97% 97%  Weight:      Height:        Intake/Output Summary (Last 24 hours) at 05/13/2023 1335 Last data filed at 05/13/2023  1233 Gross per 24 hour  Intake 56213 ml  Output 38775 ml  Net -5055 ml   Filed Weights   05/12/23 1439  Weight: 97.5 kg    Examination:  General exam: Appears calm and comfortable  Respiratory system: Clear to auscultation. Respiratory effort normal. Cardiovascular system: S1-S2, RRR, no murmurs, no pedal edema Gastrointestinal system: Soft, NT/ND, normal bowel sounds GU: Foley Central nervous system: Alert and oriented. No focal neurological deficits. Extremities: Symmetric 5 x 5 power. Skin: No rashes, lesions or ulcers Psychiatry: Judgement and insight appear normal. Mood & affect appropriate.     Data Reviewed: I have personally reviewed following labs and imaging studies  CBC: Recent Labs  Lab 05/12/23 1443 05/12/23 2248 05/13/23 0704  WBC 9.9 12.3* 11.2*  HGB 15.1 13.1 12.8*  HCT 46.7 39.6 37.6*  MCV 93.2 91.7 90.8  PLT 321 273 264   Basic Metabolic Panel: Recent Labs  Lab 05/12/23 1443 05/12/23 2248  NA 134* 136  K 3.7 4.4  CL 106 107  CO2 20* 21*  GLUCOSE 130* 170*  BUN 19 22  CREATININE 1.31* 1.22  CALCIUM 9.3 9.0   GFR: Estimated Creatinine Clearance: 70.2 mL/min (by C-G formula based on SCr of 1.22 mg/dL). Liver Function Tests: No results for input(s): "AST", "ALT", "ALKPHOS", "BILITOT", "PROT", "ALBUMIN" in the last 168 hours. No results for input(s): "LIPASE", "AMYLASE" in the last 168 hours. No results for input(s): "AMMONIA" in the last 168 hours. Coagulation Profile: Recent Labs  Lab 05/12/23 2105  INR 1.1   Cardiac Enzymes: No results for input(s): "CKTOTAL", "CKMB", "CKMBINDEX", "TROPONINI" in the last 168 hours. BNP (last 3 results) No results for input(s): "PROBNP" in the last 8760 hours. HbA1C: No results for input(s): "HGBA1C" in the last 72 hours. CBG: Recent Labs  Lab 05/12/23 2108 05/13/23 0808 05/13/23 1158  GLUCAP 144* 96 129*   Lipid Profile: No results for input(s): "CHOL", "HDL", "LDLCALC", "TRIG",  "CHOLHDL", "LDLDIRECT" in the last 72 hours. Thyroid Function Tests: No results for input(s): "TSH", "T4TOTAL", "FREET4", "T3FREE", "THYROIDAB" in the last 72 hours. Anemia Panel: No results for input(s): "VITAMINB12", "FOLATE", "FERRITIN", "TIBC", "IRON", "RETICCTPCT" in the last 72 hours. Sepsis Labs: No results for input(s): "PROCALCITON", "LATICACIDVEN" in the last 168 hours.  Recent Results (from the past 240 hour(s))  MICROSCOPIC MESSAGE     Status: None   Collection Time: 05/10/23  3:04 PM  Result Value Ref Range Status   Note   Final    Comment: This urine was analyzed for the presence of WBC,  RBC, bacteria, casts, and other formed elements.  Only those elements seen were reported. . .          Radiology Studies: CT Renal Stone Study  Result Date: 05/12/2023 CLINICAL DATA:  Bilateral flank pain and hematuria for 1 week. Dysuria. EXAM: CT ABDOMEN AND PELVIS WITHOUT CONTRAST TECHNIQUE: Multidetector CT imaging of the abdomen and pelvis was performed following the standard protocol without IV contrast. RADIATION DOSE REDUCTION: This exam was performed according to the departmental dose-optimization program which includes automated exposure control, adjustment of the mA and/or kV according to patient size and/or use of iterative  reconstruction technique. COMPARISON:  05/07/2022 FINDINGS: Lower chest: No acute findings. Hepatobiliary: No mass visualized on this unenhanced exam. Gallbladder is unremarkable. No evidence of biliary ductal dilatation. Pancreas: No mass or inflammatory process visualized on this unenhanced exam. Spleen:  Within normal limits in size. Adrenals/Urinary tract: No evidence of urolithiasis. The urinary bladder is distended and contains a large amount of high attenuation material consistent with blood clot. Mild bilateral pelvicaliectasis and ureterectasis is seen to the level of the bladder, likely due to the distended state of the bladder. Stomach/Bowel: No  evidence of obstruction, inflammatory process, or abnormal fluid collections. Vascular/Lymphatic: No pathologically enlarged lymph nodes identified. No evidence of abdominal aortic aneurysm. Reproductive:  Mild-to-moderately enlarged prostate gland. Other:  None. Musculoskeletal:  No suspicious bone lesions identified. IMPRESSION: Distended urinary bladder, which contains a large amount of blood clot. Mild bilateral hydronephrosis and ureterectasis, likely due to the distended state of the bladder. No evidence of urolithiasis. Mild-to-moderately enlarged prostate. Electronically Signed   By: Danae Orleans M.Holmes.   On: 05/12/2023 16:13        Scheduled Meds:  atorvastatin  80 mg Oral QHS   carvedilol  6.25 mg Oral BID WC   cephALEXin  500 mg Oral TID   Chlorhexidine Gluconate Cloth  6 each Topical Daily   finasteride  5 mg Oral Daily   insulin aspart  0-5 Units Subcutaneous QHS   insulin aspart  0-9 Units Subcutaneous TID WC   nicotine  21 mg Transdermal Daily   sacubitril-valsartan  1 tablet Oral BID   spironolactone  25 mg Oral Daily   tamsulosin  0.4 mg Oral QPC supper   Continuous Infusions:  sodium chloride irrigation       LOS: 0 days      Tresa Moore, MD [facility]   If 7PM-7AM, please contact night-coverage  05/13/2023, 1:35 PM

## 2023-05-13 NOTE — Plan of Care (Signed)

## 2023-05-13 NOTE — Progress Notes (Signed)
Urology Inpatient Progress Report     Intv/Subj: No acute events overnight. No need for manual irrigation since initiation of CBI. Hgb downtrending from 15.1 to 12.8.  Principal Problem:   Hematuria Active Problems:   Tobacco use   Acute urinary retention   Bilateral hydronephrosis   HTN (hypertension)   HLD (hyperlipidemia)   Type II diabetes mellitus with renal manifestations (HCC)   Chronic kidney disease, stage 3a (HCC)   Chronic systolic CHF (congestive heart failure) (HCC)   BPH (benign prostatic hyperplasia)   UTI (urinary tract infection)   CAD (coronary artery disease)  Current Facility-Administered Medications  Medication Dose Route Frequency Provider Last Rate Last Admin   acetaminophen (TYLENOL) tablet 650 mg  650 mg Oral Q6H PRN Lorretta Harp, MD       atorvastatin (LIPITOR) tablet 80 mg  80 mg Oral QHS Lorretta Harp, MD   80 mg at 05/12/23 2117   carvedilol (COREG) tablet 6.25 mg  6.25 mg Oral BID WC Lorretta Harp, MD       cephALEXin (KEFLEX) capsule 500 mg  500 mg Oral TID Lorretta Harp, MD   500 mg at 05/13/23 7253   Chlorhexidine Gluconate Cloth 2 % PADS 6 each  6 each Topical Daily Sreenath, Sudheer B, MD       finasteride (PROSCAR) tablet 5 mg  5 mg Oral Daily Kasandra Knudsen D, MD   5 mg at 05/13/23 6644   hydrALAZINE (APRESOLINE) injection 5 mg  5 mg Intravenous Q2H PRN Lorretta Harp, MD       insulin aspart (novoLOG) injection 0-5 Units  0-5 Units Subcutaneous QHS Lorretta Harp, MD       insulin aspart (novoLOG) injection 0-9 Units  0-9 Units Subcutaneous TID WC Lorretta Harp, MD       nicotine (NICODERM CQ - dosed in mg/24 hours) patch 21 mg  21 mg Transdermal Daily Lorretta Harp, MD   21 mg at 05/13/23 0938   ondansetron (ZOFRAN) injection 4 mg  4 mg Intravenous Q8H PRN Lorretta Harp, MD       oxyCODONE-acetaminophen (PERCOCET) 7.5-325 MG per tablet 1 tablet  1 tablet Oral Q4H PRN Lorretta Harp, MD       sacubitril-valsartan (ENTRESTO) 24-26 mg per tablet  1 tablet Oral BID Lorretta Harp, MD   1 tablet at 05/13/23 0953   sodium chloride irrigation 0.9 % 3,000 mL  3,000 mL Irrigation Continuous Kasandra Knudsen D, MD   3,000 mL at 05/13/23 1145   spironolactone (ALDACTONE) tablet 25 mg  25 mg Oral Daily Lorretta Harp, MD   25 mg at 05/13/23 0937   tamsulosin (FLOMAX) capsule 0.4 mg  0.4 mg Oral QPC supper Kasandra Knudsen D, MD   0.4 mg at 05/12/23 2147     Objective: Vital: Vitals:   05/12/23 1441 05/12/23 1909 05/13/23 0057 05/13/23 0807  BP: 110/71 (!) 128/57 109/60 (!) 90/58  Pulse: 82 81 73 88  Resp: 16 16 20 18   Temp: 97.6 F (36.4 C) 97.8 F (36.6 C) 98 F (36.7 C) 97.7 F (36.5 C)  TempSrc: Oral Oral    SpO2: 94% 100% 97% 97%  Weight:      Height:       I/Os: I/O last 3 completed shifts: In: 03474 [P.O.:480; Other:21000] Out: 22000 [Urine:22000]  Physical Exam:  General: Patient is in no apparent distress Lungs: Normal respiratory effort, chest expands symmetrically. GI:The abdomen is soft and nontender JP drain with serosanguinous drainage Foley: 24Fr 3 way  foley with CBI on moderate/fast drip, urine light pink in tubing, some clots seen  Ext: lower extremities symmetric  Lab Results: Recent Labs    05/12/23 1443 05/12/23 2248 05/13/23 0704  WBC 9.9 12.3* 11.2*  HGB 15.1 13.1 12.8*  HCT 46.7 39.6 37.6*   Recent Labs    05/12/23 1443 05/12/23 2248  NA 134* 136  K 3.7 4.4  CL 106 107  CO2 20* 21*  GLUCOSE 130* 170*  BUN 19 22  CREATININE 1.31* 1.22  CALCIUM 9.3 9.0   Recent Labs    05/12/23 2105  INR 1.1   No results for input(s): "LABURIN" in the last 72 hours. Results for orders placed or performed in visit on 05/10/23  MICROSCOPIC MESSAGE     Status: None   Collection Time: 05/10/23  3:04 PM  Result Value Ref Range Status   Note   Final    Comment: This urine was analyzed for the presence of WBC,  RBC, bacteria, casts, and other formed elements.  Only those elements seen were reported. . .      Studies/Results: CT Renal Stone Study  Result Date: 05/12/2023 CLINICAL DATA:  Bilateral flank pain and hematuria for 1 week. Dysuria. EXAM: CT ABDOMEN AND PELVIS WITHOUT CONTRAST TECHNIQUE: Multidetector CT imaging of the abdomen and pelvis was performed following the standard protocol without IV contrast. RADIATION DOSE REDUCTION: This exam was performed according to the departmental dose-optimization program which includes automated exposure control, adjustment of the mA and/or kV according to patient size and/or use of iterative reconstruction technique. COMPARISON:  05/07/2022 FINDINGS: Lower chest: No acute findings. Hepatobiliary: No mass visualized on this unenhanced exam. Gallbladder is unremarkable. No evidence of biliary ductal dilatation. Pancreas: No mass or inflammatory process visualized on this unenhanced exam. Spleen:  Within normal limits in size. Adrenals/Urinary tract: No evidence of urolithiasis. The urinary bladder is distended and contains a large amount of high attenuation material consistent with blood clot. Mild bilateral pelvicaliectasis and ureterectasis is seen to the level of the bladder, likely due to the distended state of the bladder. Stomach/Bowel: No evidence of obstruction, inflammatory process, or abnormal fluid collections. Vascular/Lymphatic: No pathologically enlarged lymph nodes identified. No evidence of abdominal aortic aneurysm. Reproductive:  Mild-to-moderately enlarged prostate gland. Other:  None. Musculoskeletal:  No suspicious bone lesions identified. IMPRESSION: Distended urinary bladder, which contains a large amount of blood clot. Mild bilateral hydronephrosis and ureterectasis, likely due to the distended state of the bladder. No evidence of urolithiasis. Mild-to-moderately enlarged prostate. Electronically Signed   By: Danae Orleans M.D.   On: 05/12/2023 16:13    Assessment/Plan: Gross hematuria: -continue CBI (discussed rate, not letting bags run  dry) -NPO midnight in case OR is indicated in AM -will follow Hgb  Urinary retention: -continue foley -continue tamsulosin and finasteride   Kasandra Knudsen,  MD Urology 05/13/2023, 12:45 PM

## 2023-05-13 NOTE — Progress Notes (Signed)
PT Cancellation Note  Patient Details Name: Jonthan D [place] MRN: [id number] DOB: [birth year]   Cancelled Treatment:    Reason Eval/Treat Not Completed: Other (comment). Consult received and chart reviewed. Pt currently on CBI, will hold off on mobility at this time. Will re-attempt next date.   Keyri Salberg 05/13/2023, 12:53 PM Elizabeth Palau, PT, DPT, GCS [phone]

## 2023-05-13 NOTE — Plan of Care (Signed)
  Problem: Fluid Volume: Goal: Ability to maintain a balanced intake and output will improve Outcome: Progressing   Problem: Health Behavior/Discharge Planning: Goal: Ability to manage health-related needs will improve Outcome: Progressing   Problem: Metabolic: Goal: Ability to maintain appropriate glucose levels will improve Outcome: Progressing   Problem: Nutritional: Goal: Maintenance of adequate nutrition will improve Outcome: Progressing   Problem: Skin Integrity: Goal: Risk for impaired skin integrity will decrease Outcome: Progressing   Problem: Tissue Perfusion: Goal: Adequacy of tissue perfusion will improve Outcome: Progressing   Problem: Education: Goal: Knowledge of General Education information will improve Description: Including pain rating scale, medication(s)/side effects and non-pharmacologic comfort measures Outcome: Progressing   Problem: Clinical Measurements: Goal: Will remain free from infection Outcome: Progressing Goal: Diagnostic test results will improve Outcome: Progressing Goal: Respiratory complications will improve Outcome: Progressing Goal: Cardiovascular complication will be avoided Outcome: Progressing   Problem: Activity: Goal: Risk for activity intolerance will decrease Outcome: Progressing   Problem: Nutrition: Goal: Adequate nutrition will be maintained Outcome: Progressing   Problem: Coping: Goal: Level of anxiety will decrease Outcome: Progressing   Problem: Elimination: Goal: Will not experience complications related to bowel motility Outcome: Progressing Goal: Will not experience complications related to urinary retention Outcome: Progressing   Problem: Pain Managment: Goal: General experience of comfort will improve Outcome: Progressing   Problem: Safety: Goal: Ability to remain free from injury will improve Outcome: Progressing   Problem: Skin Integrity: Goal: Risk for impaired skin integrity will  decrease Outcome: Progressing

## 2023-05-14 ENCOUNTER — Inpatient Hospital Stay: Payer: 59 | Admitting: Anesthesiology

## 2023-05-14 ENCOUNTER — Encounter
Admission: EM | Disposition: A | Discharge status: Home / Self Care | Payer: Self-pay | Source: Home / Self Care | Attending: Internal Medicine

## 2023-05-14 ENCOUNTER — Encounter: Payer: Self-pay | Admitting: Internal Medicine

## 2023-05-14 ENCOUNTER — Inpatient Hospital Stay: Payer: 59

## 2023-05-14 DIAGNOSIS — R310 Gross hematuria: Secondary | ICD-10-CM | POA: Diagnosis not present

## 2023-05-14 DIAGNOSIS — N3289 Other specified disorders of bladder: Secondary | ICD-10-CM | POA: Diagnosis not present

## 2023-05-14 HISTORY — PX: CYSTOSCOPY WITH FULGERATION: SHX6638

## 2023-05-14 LAB — CBC WITH DIFFERENTIAL/PLATELET
Abs Immature Granulocytes: 0.02 10*3/uL (ref 0.00–0.07)
Basophils Absolute: 0.1 10*3/uL (ref 0.0–0.1)
Basophils Relative: 1 %
Eosinophils Absolute: 0.4 10*3/uL (ref 0.0–0.5)
Eosinophils Relative: 4 %
HCT: 37.1 % — ABNORMAL LOW (ref 39.0–52.0)
Hemoglobin: 12.6 g/dL — ABNORMAL LOW (ref 13.0–17.0)
Immature Granulocytes: 0 %
Lymphocytes Relative: 28 %
Lymphs Abs: 2.8 10*3/uL (ref 0.7–4.0)
MCH: 31.2 pg (ref 26.0–34.0)
MCHC: 34 g/dL (ref 30.0–36.0)
MCV: 91.8 fL (ref 80.0–100.0)
Monocytes Absolute: 0.8 10*3/uL (ref 0.1–1.0)
Monocytes Relative: 8 %
Neutro Abs: 5.8 10*3/uL (ref 1.7–7.7)
Neutrophils Relative %: 59 %
Platelets: 263 10*3/uL (ref 150–400)
RBC: 4.04 MIL/uL — ABNORMAL LOW (ref 4.22–5.81)
RDW: 13.3 % (ref 11.5–15.5)
WBC: 9.8 10*3/uL (ref 4.0–10.5)
nRBC: 0 % (ref 0.0–0.2)

## 2023-05-14 LAB — BASIC METABOLIC PANEL
Anion gap: 8 (ref 5–15)
BUN: 14 mg/dL (ref 8–23)
CO2: 25 mmol/L (ref 22–32)
Calcium: 8.5 mg/dL — ABNORMAL LOW (ref 8.9–10.3)
Chloride: 105 mmol/L (ref 98–111)
Creatinine, Ser: 0.95 mg/dL (ref 0.61–1.24)
GFR, Estimated: >60 mL/min (ref >60)
Glucose, Bld: 109 mg/dL — ABNORMAL HIGH (ref 70–99)
Potassium: 3.9 mmol/L (ref 3.5–5.1)
Sodium: 138 mmol/L (ref 135–145)

## 2023-05-14 LAB — GLUCOSE, CAPILLARY
Glucose-Capillary: 95 mg/dL (ref 70–99)
Glucose-Capillary: 95 mg/dL (ref 70–99)
Glucose-Capillary: 103 mg/dL — ABNORMAL HIGH (ref 70–99)
Glucose-Capillary: 152 mg/dL — ABNORMAL HIGH (ref 70–99)
Glucose-Capillary: 108 mg/dL — ABNORMAL HIGH (ref 70–99)

## 2023-05-14 SURGERY — CYSTOSCOPY, WITH BLADDER FULGURATION
Anesthesia: General | Site: Bladder

## 2023-05-14 MED ORDER — FENTANYL CITRATE (PF) 100 MCG/2ML IJ SOLN
INTRAMUSCULAR | Status: DC | PRN
  Administered 2023-05-14 (×2): 25 ug via INTRAVENOUS

## 2023-05-14 MED ORDER — STERILE WATER FOR IRRIGATION IR SOLN
Status: DC | PRN
  Administered 2023-05-14: 3000 mL

## 2023-05-14 MED ORDER — FENTANYL CITRATE (PF) 100 MCG/2ML IJ SOLN
INTRAMUSCULAR | Status: AC
  Filled 2023-05-14: qty 2

## 2023-05-14 MED ORDER — LIDOCAINE HCL (PF) 2 % IJ SOLN
INTRAMUSCULAR | Status: DC | PRN
Start: 2023-05-14 — End: 2023-05-14
  Administered 2023-05-14: 80 mg via INTRADERMAL

## 2023-05-14 MED ORDER — PROPOFOL 10 MG/ML IV BOLUS
INTRAVENOUS | Status: DC | PRN
  Administered 2023-05-14: 40 mg via INTRAVENOUS

## 2023-05-14 MED ORDER — SODIUM CHLORIDE 0.9 % IV SOLN
INTRAVENOUS | Status: DC | PRN
Start: 2023-05-14 — End: 2023-05-14

## 2023-05-14 MED ORDER — PROPOFOL 1000 MG/100ML IV EMUL
INTRAVENOUS | Status: AC
  Filled 2023-05-14: qty 100

## 2023-05-14 MED ORDER — PROPOFOL 500 MG/50ML IV EMUL
INTRAVENOUS | Status: DC | PRN
  Administered 2023-05-14: 150 ug/kg/min via INTRAVENOUS

## 2023-05-14 MED ORDER — ACETAMINOPHEN 325 MG PO TABS: 650.0000 mg | ORAL_TABLET | Freq: Four times a day (QID) | ORAL | Status: DC | PRN

## 2023-05-14 MED ORDER — PHENYLEPHRINE 80 MCG/ML (10ML) SYRINGE FOR IV PUSH (FOR BLOOD PRESSURE SUPPORT)
PREFILLED_SYRINGE | INTRAVENOUS | Status: DC | PRN
  Administered 2023-05-14 (×2): 80 ug via INTRAVENOUS

## 2023-05-14 MED ORDER — MIDAZOLAM HCL 2 MG/2ML IJ SOLN
INTRAMUSCULAR | Status: AC
  Filled 2023-05-14: qty 2

## 2023-05-14 MED ORDER — MIDAZOLAM HCL 2 MG/2ML IJ SOLN
INTRAMUSCULAR | Status: DC | PRN
  Administered 2023-05-14: 2 mg via INTRAVENOUS

## 2023-05-14 SURGICAL SUPPLY — 14 items
BAG DRAIN SIEMENS DORNER NS (MISCELLANEOUS) ×1 IMPLANT
BAG DRN NS LF (MISCELLANEOUS) ×1
CATH FOL 3WAY LX COUV 24X75 (CATHETERS) IMPLANT
ELECT BIVAP BIPO 22/24 DONUT (ELECTROSURGICAL) ×1
ELECT REM PT RETURN 9FT ADLT (ELECTROSURGICAL) ×1
ELECTRD BIVAP BIPO 22/24 DONUT (ELECTROSURGICAL) IMPLANT
ELECTRODE REM PT RTRN 9FT ADLT (ELECTROSURGICAL) ×1 IMPLANT
GLOVE BIOGEL M 6.5 STRL (GLOVE) ×1 IMPLANT
GOWN STRL REUS W/ TWL LRG LVL3 (GOWN DISPOSABLE) ×1 IMPLANT
GOWN STRL REUS W/TWL LRG LVL3 (GOWN DISPOSABLE) ×2
IV NS IRRIG 3000ML ARTHROMATIC (IV SOLUTION) ×1 IMPLANT
PACK CYSTO AR (MISCELLANEOUS) ×1 IMPLANT
SET CYSTO W/LG BORE CLAMP LF (SET/KITS/TRAYS/PACK) ×1 IMPLANT
WATER STERILE IRR 500ML POUR (IV SOLUTION) ×1 IMPLANT

## 2023-05-14 NOTE — Progress Notes (Signed)
PT Cancellation Note  Patient Details Name: Tray D [place] MRN: [id number] DOB: 07-Nov-1955   Cancelled Treatment:    Reason Eval/Treat Not Completed: Other (comment).  PT consult received.  Chart reviewed.  Nursing reports pt still on CBI and preparing to go for surgery.  Will hold therapy at this time and re-attempt PT evaluation at a later date/time as medically appropriate.   Irving Burton Jonika Critz 05/14/2023, 9:56 AM

## 2023-05-14 NOTE — Anesthesia Preprocedure Evaluation (Signed)
Anesthesia Evaluation  Patient identified by MRN, date of birth, ID band Patient awake    Reviewed: Allergy & Precautions, NPO status , Patient's Chart, lab work & pertinent test results, reviewed documented beta blocker date and time   History of Anesthesia Complications Negative for: history of anesthetic complications  Airway Mallampati: III  TM Distance: >3 FB Neck ROM: full    Dental no notable dental hx. (+) Edentulous Lower, Edentulous Upper   Pulmonary neg sleep apnea, neg COPD, Current Smoker and Patient abstained from smoking.   Pulmonary exam normal breath sounds clear to auscultation       Cardiovascular Exercise Tolerance: Good METShypertension, Pt. on medications + CAD, + Past MI and +CHF  Normal cardiovascular exam(-) dysrhythmias  Rhythm:Regular Rate:Normal - Systolic murmurs Per cardiology 5/24: PVCs EKG today shows normal sinus rhythm with PVCs, which appears to be a new finding.  Patient denies any palpitations.  He is on Coreg 3.125 mg twice daily.  He reports he had blood work from his PCP, we will request labs from them.  I will order a 2-week heart monitor.  I will increase Coreg to 6.25 mg twice daily.  Will see him back after the heart monitor.   CAD The patient denies any anginal symptoms.  Cath in 2018 showed severe two-vessel CAD, unable to perform angioplasty due to inability to cross the wire.  ETT in 2021 showed no evidence of ischemia.  Will continue medical management with aspirin, statin, and beta-blocker therapy.   HFrEF ICM Most recent echocardiogram showed improved LVEF of 45 to 50%, severe hypokinesis of the inferior basal to mid septal wall, grade 1 diastolic dysfunction, normal RV SF, mild MR. The patient is euvolemic on exam. Continue Coreg, Jardiance, Entresto, and spironolactone.    Mild MR Echo in April 2023 showed mild MR.    Neuro/Psych  Bilateral leg weakness        Intervertebral  lumbar disc disorder with myelopathy, lumbar region    Neuromuscular disease  negative psych ROS   GI/Hepatic negative GI ROS, Neg liver ROS,neg GERD  ,,  Endo/Other  diabetes, Type 2    Renal/GU CRFRenal disease     Musculoskeletal  (+) Arthritis ,    Abdominal Normal abdominal exam  (+)   Peds  Hematology negative hematology ROS (+)   Anesthesia Other Findings Past Medical History: No date: Aortic atherosclerosis (HCC) No date: Arthritis 07/23/2017: Chronic systolic heart failure (HCC)     Comment:  a.) TTE 07/23/2017: EF 30-35%, mild LVH, inferolateral               and inferior AK, mild BAE, G1DD; b.) TTE 10/23/2017: EF               40-45%, mild LVH, basal-mid inferior and inferoseptal AK,              mild MR; c.) TTE 11/19/2021: EF 45-50%, glob HK with mod               to severe inferior and basal to mid septal HK, GLS               -15.3%, mild MR, G1DD. 07/21/2017: Coronary artery disease     Comment:  a. 07/2017 Late presenting inferior STEMI/Cath: LM nl,               LAD min irregs, LCX 100d (L->L collats), RCA 100p (L->R  collats), EF 25-35%-->Med Rx; b. 10/2017 ETT (DOT): Ex               time 7:38, baseline antlat TWI, no acute changes.  No date: DDD (degenerative disc disease), lumbar No date: Diverticulosis of colon No date: Enlarged prostate No date: Erectile dysfunction     Comment:  a.) on PDE5i (sildenafil) PRN No date: HTN (hypertension) No date: Hx of bilateral cataract extraction No date: Hyperlipidemia No date: Ischemic cardiomyopathy     Comment:  a.) LHC 07/21/2017: EF 25-35%; b.) TTE 07/23/2017:               30-35%; c.) TTE 10/23/2017: EF 40-45%; d.) TTE               11/19/2021: EF 45-50% No date: Left inguinal hernia 07/21/2017: ST elevation myocardial infarction (STEMI) of inferior  wall (HCC)     Comment:  a.) LHC 07/21/2017: EF 25-35%, LVEDP 14 mmHg. 40% pLCx,               40% mLCx, 100% dLCx, 100% pRCA --> attempted  to cross               wire at RCA lesion, however unable. Reasonable RCA               collaterals already formed --> med mgmt. No date: T2DM (type 2 diabetes mellitus) (HCC) No date: Tobacco use  Past Surgical History: No date: CATARACT EXTRACTION; Bilateral 04/04/2019: COLONOSCOPY WITH PROPOFOL; N/A     Comment:  Procedure: COLONOSCOPY WITH PROPOFOL;  Surgeon:               Pasty Spillers, MD;  Location: [facility] ENDOSCOPY;                Service: Endoscopy;  Laterality: N/A; 07/21/2017: LEFT HEART CATH AND CORONARY ANGIOGRAPHY; N/A     Comment:  Procedure: LEFT HEART CATH AND CORONARY ANGIOGRAPHY;                Surgeon: Iran Ouch, MD;  Location: [facility] INVASIVE               CV LAB;  Service: Cardiovascular;  Laterality: N/A;     Reproductive/Obstetrics negative OB ROS                             Anesthesia Physical Anesthesia Plan  ASA: 3  Anesthesia Plan: General   Post-op Pain Management: Precedex   Induction: Intravenous  PONV Risk Score and Plan: 2 and Dexamethasone, Ondansetron and Midazolam  Airway Management Planned: Natural Airway  Additional Equipment: None  Intra-op Plan:   Post-operative Plan: Extubation in OR  Informed Consent: I have reviewed the patients History and Physical, chart, labs and discussed the procedure including the risks, benefits and alternatives for the proposed anesthesia with the patient or authorized representative who has indicated his/her understanding and acceptance.     Dental Advisory Given  Plan Discussed with: Anesthesiologist, CRNA and Surgeon  Anesthesia Plan Comments: (Discussed risks of anesthesia with patient, including possibility of difficulty with spontaneous ventilation under anesthesia necessitating airway intervention, PONV, and rare risks such as cardiac or respiratory or neurological events, and allergic reactions. Discussed the role of CRNA in patient's perioperative care.  Patient understands.)        Anesthesia Quick Evaluation

## 2023-05-14 NOTE — Anesthesia Postprocedure Evaluation (Signed)
Anesthesia Post Note  Patient: Lawrence Holmes [place]  Procedure(s) Performed: CYSTOSCOPY WITH CLOT EVACUATION AND FULGERATION (Bladder)  Patient location during evaluation: PACU Anesthesia Type: General Level of consciousness: awake and alert Pain management: pain level controlled Vital Signs Assessment: post-procedure vital signs reviewed and stable Respiratory status: spontaneous breathing, nonlabored ventilation, respiratory function stable and patient connected to nasal cannula oxygen Cardiovascular status: blood pressure returned to baseline and stable Postop Assessment: no apparent nausea or vomiting Anesthetic complications: no   No notable events documented.   Last Vitals:  Vitals:   05/14/23 1115 05/14/23 1130  BP: 101/73 111/66  Pulse: 79 72  Resp: 13 17  Temp: (!) 36.3 C (!) 36.3 C  SpO2: 97% 96%    Last Pain:  Vitals:   05/14/23 1152  TempSrc:   PainSc: 0-No pain                 Corinda Gubler

## 2023-05-14 NOTE — H&P (View-Only) (Signed)
Urology Inpatient Progress Note  Subjective: No acute events overnight.  He is afebrile, VSS. Creatinine down, 0.95.  Hemoglobin stable, 12.6. Foley catheter in place draining clear urine on fast drip CBI. Urine culture growing Macrobid resistant Proteus mirabilis, on antibiotics as below. He denies any pain.  He has been n.p.o. since midnight, sips of water with a.m. meds.  Anti-infectives: Anti-infectives (From admission, onward)    Start     Dose/Rate Route Frequency Ordered Stop   05/12/23 2200  cephALEXin (KEFLEX) capsule 500 mg        500 mg Oral 3 times daily 05/12/23 1914         Current Facility-Administered Medications  Medication Dose Route Frequency Provider Last Rate Last Admin   acetaminophen (TYLENOL) tablet 650 mg  650 mg Oral Q6H PRN Lorretta Harp, MD       atorvastatin (LIPITOR) tablet 80 mg  80 mg Oral QHS Lorretta Harp, MD   80 mg at 05/13/23 2200   carvedilol (COREG) tablet 6.25 mg  6.25 mg Oral BID WC Lorretta Harp, MD   6.25 mg at 05/14/23 4540   cephALEXin (KEFLEX) capsule 500 mg  500 mg Oral TID Lorretta Harp, MD   500 mg at 05/14/23 9811   Chlorhexidine Gluconate Cloth 2 % PADS 6 each  6 each Topical Daily Lolita Patella B, MD   6 each at 05/13/23 1416   finasteride (PROSCAR) tablet 5 mg  5 mg Oral Daily Kasandra Knudsen D, MD   5 mg at 05/14/23 9147   hydrALAZINE (APRESOLINE) injection 5 mg  5 mg Intravenous Q2H PRN Lorretta Harp, MD       insulin aspart (novoLOG) injection 0-5 Units  0-5 Units Subcutaneous QHS Lorretta Harp, MD       insulin aspart (novoLOG) injection 0-9 Units  0-9 Units Subcutaneous TID WC Lorretta Harp, MD       nicotine (NICODERM CQ - dosed in mg/24 hours) patch 21 mg  21 mg Transdermal Daily Lorretta Harp, MD   21 mg at 05/14/23 0840   ondansetron (ZOFRAN) injection 4 mg  4 mg Intravenous Q8H PRN Lorretta Harp, MD       oxyCODONE-acetaminophen (PERCOCET) 7.5-325 MG per tablet 1 tablet  1 tablet Oral Q4H PRN Lorretta Harp, MD       sacubitril-valsartan (ENTRESTO)  24-26 mg per tablet  1 tablet Oral BID Lorretta Harp, MD   1 tablet at 05/13/23 2201   sodium chloride irrigation 0.9 % 3,000 mL  3,000 mL Irrigation Continuous Kasandra Knudsen D, MD   3,000 mL at 05/14/23 0617   spironolactone (ALDACTONE) tablet 25 mg  25 mg Oral Daily Lorretta Harp, MD   25 mg at 05/13/23 0937   tamsulosin (FLOMAX) capsule 0.4 mg  0.4 mg Oral QPC supper Kasandra Knudsen D, MD   0.4 mg at 05/13/23 1710     Objective: Vital signs in last 24 hours: Temp:  [98 F (36.7 C)] 98 F (36.7 C) (09/23 0024) Pulse Rate:  [75-84] 77 (09/23 0819) Resp:  [18] 18 (09/23 0819) BP: (96-129)/(54-60) 129/60 (09/23 0819) SpO2:  [95 %-98 %] 98 % (09/23 0819) Weight:  [90.7 kg] 90.7 kg (09/23 0500)  Intake/Output from previous day: 09/22 0701 - 09/23 0700 In: 82956 [P.O.:480] Out: 21308 [Urine:58025] Intake/Output this shift: Total I/O In: -  Out: 2700 [Urine:2700]  Physical Exam Vitals and nursing note reviewed.  Constitutional:      General: He is not in acute distress.    Appearance:  He is not ill-appearing, toxic-appearing or diaphoretic.  HENT:     Head: Normocephalic and atraumatic.  Pulmonary:     Effort: Pulmonary effort is normal. No respiratory distress.  Skin:    General: Skin is warm and dry.  Neurological:     Mental Status: He is alert and oriented to person, place, and time.  Psychiatric:        Mood and Affect: Mood normal.        Behavior: Behavior normal.     Lab Results:  Recent Labs    05/13/23 0704 05/14/23 0805  WBC 11.2* 9.8  HGB 12.8* 12.6*  HCT 37.6* 37.1*  PLT 264 263   BMET Recent Labs    05/12/23 2248 05/14/23 0805  NA 136 138  K 4.4 3.9  CL 107 105  CO2 21* 25  GLUCOSE 170* 109*  BUN 22 14  CREATININE 1.22 0.95  CALCIUM 9.0 8.5*   PT/INR Recent Labs    05/12/23 2105  LABPROT 14.8  INR 1.1   Studies/Results: DG OR UROLOGY CYSTO IMAGE ([facility] ONLY)  Result Date: 05/14/2023 There is no interpretation for this exam.  This  order is for images obtained during a surgical procedure.  Please See "Surgeries" Tab for more information regarding the procedure.   CT Renal Stone Study  Result Date: 05/12/2023 CLINICAL DATA:  Bilateral flank pain and hematuria for 1 week. Dysuria. EXAM: CT ABDOMEN AND PELVIS WITHOUT CONTRAST TECHNIQUE: Multidetector CT imaging of the abdomen and pelvis was performed following the standard protocol without IV contrast. RADIATION DOSE REDUCTION: This exam was performed according to the departmental dose-optimization program which includes automated exposure control, adjustment of the mA and/or kV according to patient size and/or use of iterative reconstruction technique. COMPARISON:  05/07/2022 FINDINGS: Lower chest: No acute findings. Hepatobiliary: No mass visualized on this unenhanced exam. Gallbladder is unremarkable. No evidence of biliary ductal dilatation. Pancreas: No mass or inflammatory process visualized on this unenhanced exam. Spleen:  Within normal limits in size. Adrenals/Urinary tract: No evidence of urolithiasis. The urinary bladder is distended and contains a large amount of high attenuation material consistent with blood clot. Mild bilateral pelvicaliectasis and ureterectasis is seen to the level of the bladder, likely due to the distended state of the bladder. Stomach/Bowel: No evidence of obstruction, inflammatory process, or abnormal fluid collections. Vascular/Lymphatic: No pathologically enlarged lymph nodes identified. No evidence of abdominal aortic aneurysm. Reproductive:  Mild-to-moderately enlarged prostate gland. Other:  None. Musculoskeletal:  No suspicious bone lesions identified. IMPRESSION: Distended urinary bladder, which contains a large amount of blood clot. Mild bilateral hydronephrosis and ureterectasis, likely due to the distended state of the bladder. No evidence of urolithiasis. Mild-to-moderately enlarged prostate. Electronically Signed   By: Danae Orleans M.D.   On:  05/12/2023 16:13    Assessment & Plan: 67 year old male with PMH BPH with LUTS admitted with urinary retention and gross hematuria with clot retention now on CBI.  Blood counts stable.  Efflux was clear on my arrival.  I attempted to manually irrigate his catheter.  I was able to push 180 cc of sterile water through the catheter into his bladder, but was unable to irrigate this free, concerning for retained, organized clot.  At this point, I elected to discontinue manual irrigation and we discussed proceeding to the OR with Dr. Apolinar Junes today for cystoscopy with clot evacuation and possible fulguration.  He is in agreement with this plan.  Will keep him n.p.o. in advance of procedure.  I stopped his CBI due to impaired Foley drainage.  He has been added to Dr. Delana Meyer OR schedule.  Informed consent orders placed.  Carman Ching, PA-C 05/14/2023

## 2023-05-14 NOTE — Progress Notes (Signed)
Urology Inpatient Progress Note  Subjective: No acute events overnight.  He is afebrile, VSS. Creatinine down, 0.95.  Hemoglobin stable, 12.6. Foley catheter in place draining clear urine on fast drip CBI. Urine culture growing Macrobid resistant Proteus mirabilis, on antibiotics as below. He denies any pain.  He has been n.p.o. since midnight, sips of water with a.m. meds.  Anti-infectives: Anti-infectives (From admission, onward)    Start     Dose/Rate Route Frequency Ordered Stop   05/12/23 2200  cephALEXin (KEFLEX) capsule 500 mg        500 mg Oral 3 times daily 05/12/23 1914         Current Facility-Administered Medications  Medication Dose Route Frequency Provider Last Rate Last Admin   acetaminophen (TYLENOL) tablet 650 mg  650 mg Oral Q6H PRN Lorretta Harp, MD       atorvastatin (LIPITOR) tablet 80 mg  80 mg Oral QHS Lorretta Harp, MD   80 mg at 05/13/23 2200   carvedilol (COREG) tablet 6.25 mg  6.25 mg Oral BID WC Lorretta Harp, MD   6.25 mg at 05/14/23 4540   cephALEXin (KEFLEX) capsule 500 mg  500 mg Oral TID Lorretta Harp, MD   500 mg at 05/14/23 9811   Chlorhexidine Gluconate Cloth 2 % PADS 6 each  6 each Topical Daily Lolita Patella B, MD   6 each at 05/13/23 1416   finasteride (PROSCAR) tablet 5 mg  5 mg Oral Daily Kasandra Knudsen D, MD   5 mg at 05/14/23 9147   hydrALAZINE (APRESOLINE) injection 5 mg  5 mg Intravenous Q2H PRN Lorretta Harp, MD       insulin aspart (novoLOG) injection 0-5 Units  0-5 Units Subcutaneous QHS Lorretta Harp, MD       insulin aspart (novoLOG) injection 0-9 Units  0-9 Units Subcutaneous TID WC Lorretta Harp, MD       nicotine (NICODERM CQ - dosed in mg/24 hours) patch 21 mg  21 mg Transdermal Daily Lorretta Harp, MD   21 mg at 05/14/23 0840   ondansetron (ZOFRAN) injection 4 mg  4 mg Intravenous Q8H PRN Lorretta Harp, MD       oxyCODONE-acetaminophen (PERCOCET) 7.5-325 MG per tablet 1 tablet  1 tablet Oral Q4H PRN Lorretta Harp, MD       sacubitril-valsartan (ENTRESTO)  24-26 mg per tablet  1 tablet Oral BID Lorretta Harp, MD   1 tablet at 05/13/23 2201   sodium chloride irrigation 0.9 % 3,000 mL  3,000 mL Irrigation Continuous Kasandra Knudsen D, MD   3,000 mL at 05/14/23 0617   spironolactone (ALDACTONE) tablet 25 mg  25 mg Oral Daily Lorretta Harp, MD   25 mg at 05/13/23 0937   tamsulosin (FLOMAX) capsule 0.4 mg  0.4 mg Oral QPC supper Kasandra Knudsen D, MD   0.4 mg at 05/13/23 1710     Objective: Vital signs in last 24 hours: Temp:  [98 F (36.7 C)] 98 F (36.7 C) (09/23 0024) Pulse Rate:  [75-84] 77 (09/23 0819) Resp:  [18] 18 (09/23 0819) BP: (96-129)/(54-60) 129/60 (09/23 0819) SpO2:  [95 %-98 %] 98 % (09/23 0819) Weight:  [90.7 kg] 90.7 kg (09/23 0500)  Intake/Output from previous day: 09/22 0701 - 09/23 0700 In: 82956 [P.O.:480] Out: 21308 [Urine:58025] Intake/Output this shift: Total I/O In: -  Out: 2700 [Urine:2700]  Physical Exam Vitals and nursing note reviewed.  Constitutional:      General: He is not in acute distress.    Appearance:  He is not ill-appearing, toxic-appearing or diaphoretic.  HENT:     Head: Normocephalic and atraumatic.  Pulmonary:     Effort: Pulmonary effort is normal. No respiratory distress.  Skin:    General: Skin is warm and dry.  Neurological:     Mental Status: He is alert and oriented to person, place, and time.  Psychiatric:        Mood and Affect: Mood normal.        Behavior: Behavior normal.     Lab Results:  Recent Labs    05/13/23 0704 05/14/23 0805  WBC 11.2* 9.8  HGB 12.8* 12.6*  HCT 37.6* 37.1*  PLT 264 263   BMET Recent Labs    05/12/23 2248 05/14/23 0805  NA 136 138  K 4.4 3.9  CL 107 105  CO2 21* 25  GLUCOSE 170* 109*  BUN 22 14  CREATININE 1.22 0.95  CALCIUM 9.0 8.5*   PT/INR Recent Labs    05/12/23 2105  LABPROT 14.8  INR 1.1   Studies/Results: DG OR UROLOGY CYSTO IMAGE ([facility] ONLY)  Result Date: 05/14/2023 There is no interpretation for this exam.  This  order is for images obtained during a surgical procedure.  Please See "Surgeries" Tab for more information regarding the procedure.   CT Renal Stone Study  Result Date: 05/12/2023 CLINICAL DATA:  Bilateral flank pain and hematuria for 1 week. Dysuria. EXAM: CT ABDOMEN AND PELVIS WITHOUT CONTRAST TECHNIQUE: Multidetector CT imaging of the abdomen and pelvis was performed following the standard protocol without IV contrast. RADIATION DOSE REDUCTION: This exam was performed according to the departmental dose-optimization program which includes automated exposure control, adjustment of the mA and/or kV according to patient size and/or use of iterative reconstruction technique. COMPARISON:  05/07/2022 FINDINGS: Lower chest: No acute findings. Hepatobiliary: No mass visualized on this unenhanced exam. Gallbladder is unremarkable. No evidence of biliary ductal dilatation. Pancreas: No mass or inflammatory process visualized on this unenhanced exam. Spleen:  Within normal limits in size. Adrenals/Urinary tract: No evidence of urolithiasis. The urinary bladder is distended and contains a large amount of high attenuation material consistent with blood clot. Mild bilateral pelvicaliectasis and ureterectasis is seen to the level of the bladder, likely due to the distended state of the bladder. Stomach/Bowel: No evidence of obstruction, inflammatory process, or abnormal fluid collections. Vascular/Lymphatic: No pathologically enlarged lymph nodes identified. No evidence of abdominal aortic aneurysm. Reproductive:  Mild-to-moderately enlarged prostate gland. Other:  None. Musculoskeletal:  No suspicious bone lesions identified. IMPRESSION: Distended urinary bladder, which contains a large amount of blood clot. Mild bilateral hydronephrosis and ureterectasis, likely due to the distended state of the bladder. No evidence of urolithiasis. Mild-to-moderately enlarged prostate. Electronically Signed   By: Danae Orleans M.D.   On:  05/12/2023 16:13    Assessment & Plan: 67 year old male with PMH BPH with LUTS admitted with urinary retention and gross hematuria with clot retention now on CBI.  Blood counts stable.  Efflux was clear on my arrival.  I attempted to manually irrigate his catheter.  I was able to push 180 cc of sterile water through the catheter into his bladder, but was unable to irrigate this free, concerning for retained, organized clot.  At this point, I elected to discontinue manual irrigation and we discussed proceeding to the OR with Dr. Apolinar Junes today for cystoscopy with clot evacuation and possible fulguration.  He is in agreement with this plan.  Will keep him n.p.o. in advance of procedure.  I stopped his CBI due to impaired Foley drainage.  He has been added to Dr. Delana Meyer OR schedule.  Informed consent orders placed.  Carman Ching, PA-C 05/14/2023

## 2023-05-14 NOTE — Progress Notes (Signed)
OT Cancellation Note  Patient Details Name: Lawrence Holmes MRN: [id number] DOB: May 20, 1956   Cancelled Treatment:    Reason Eval/Treat Not Completed: Other (comment). Consult received, chart reviewed. Nursing reports pt still on CBI and preparing to go for surgery. Will hold therapy at this time and re-attempt OT evaluation at a later date/time as medically appropriate.   Arman Filter., MPH, MS, OTR/L ascom [phone] 05/14/23, 10:08 AM

## 2023-05-14 NOTE — Anesthesia Procedure Notes (Signed)
Date/Time: 05/14/2023 10:38 AM  Performed by: Stormy Fabian, CRNAPre-anesthesia Checklist: Patient identified, Emergency Drugs available, Suction available and Patient being monitored Patient Re-evaluated:Patient Re-evaluated prior to induction Oxygen Delivery Method: Simple face mask Induction Type: IV induction Dental Injury: Teeth and Oropharynx as per pre-operative assessment

## 2023-05-14 NOTE — Plan of Care (Signed)

## 2023-05-14 NOTE — Progress Notes (Signed)
PROGRESS NOTE    Lawrence Holmes [place]  CZY:[id number] DOB: [birth year] DOA: 05/12/2023 PCP: Danelle Berry, PA-C    Brief Narrative:   67 y.o. male with medical history significant of BPH, sCHF with EF 45-50%, HTN, HLD, DM, BPH, CKD-3a, smoker, who presents with hematuria, flank pain, difficulty urinating and dysuria.   Patient states that he has blood in urine, bilateral flank pain, dysuria, burning on urination for more than 3 days.  The flank pain is constant, aching, mild, nonradiating, not alleviated or aggravated by any known factors.  He was seen by PCP on 9/19 and was diagnosed with UTI, started on Keflex.  Patient states that he feels better in terms of dysuria and burning on urination, but he has developed difficulty urinating in the past 12 hours.  No fever or chills. Patient denies chest pain, cough, shortness of breath.  No nausea, vomiting, diarrhea or abdominal pain.  9/22: Urology consulted.  CBI initiated.  On 9/22 urine still running pink.  Discussed with urology.  Possible plan for surgical intervention 9/23.  9/23: Seen by urology this a.m.  Taken to OR for cystoscopy, clot evacuation, fulguration of bleeding prostatic vessels.   Assessment & Plan:   Principal Problem:   Hematuria Active Problems:   Acute urinary retention   Bilateral hydronephrosis   Chronic systolic CHF (congestive heart failure) (HCC)   UTI (urinary tract infection)   CAD (coronary artery disease)   BPH (benign prostatic hyperplasia)   HTN (hypertension)   HLD (hyperlipidemia)   Type II diabetes mellitus with renal manifestations (HCC)   Chronic kidney disease, stage 3a (HCC)   Tobacco use  Hematuria, acute urinary retention, and bilateral hydronephrosis: Foley catheter placed.  CBI initiated.   Plan: N.p.o. for OR today.  Status post cystoscopy, clot evacuation, fulguration of bleeding prostatic vessels   UTI (urinary tract infection): Patient had positive urinalysis on 9/19 with turbid appearance,  3+ leukocyte, positive nitrite, many bacteria, WBC 10-20.  Patient is on Keflex Plan: Continue Keflex    Chronic systolic CHF (congestive heart failure) (HCC): 2D echo on 12/09/2021 showed EF of 45-50% with grade 1 diastolic dysfunction.  Patient does not have leg edema or shortness of breath.  CHF seem to be compensated. Plan: Continue spironolactone    BPH (benign prostatic hyperplasia): With obstruction. -Flomax and Proscar   CAD: with hx of STEMI. No CP -hold ASA due to hematuria.  Can restart in 24 hours if bleeding stabilized -continue lipitor   HTN (hypertension) -Coreg, spironolactone -IV hydralazine as needed   HLD (hyperlipidemia) -Lipitor   Type II diabetes mellitus with renal manifestations [facility]): Recent A1c 6.7, well-controlled.  Patent is taking Jardiance and metformin -Sliding scale insulin   Chronic kidney disease, stage 3a (HCC): Slightly worsening than baseline. -Spironolactone resumed   Tobacco use -Nicotine patch -Encourage quitting smoking      DVT prophylaxis: SCD Code Status: Full Family Communication: None today Disposition Plan: Status is: Inpatient Remains inpatient appropriate because: Hematuria     Level of care: Med-Surg  Consultants:  Urology  Procedures:  Foley catheter/CBI  Antimicrobials: Keflex   Subjective: Seen and examined.  Resting comfortably.  No visible distress.  N.p.o. for OR.  Objective: Vitals:   05/14/23 1106 05/14/23 1110 05/14/23 1115 05/14/23 1130  BP: (!) 91/51 (!) 96/59 101/73 111/66  Pulse: 74  79 72  Resp: 13  13 17   Temp:   (!) 97.3 F (36.3 C) (!) 97.3 F (36.3 C)  TempSrc:  SpO2: 100%  97% 96%  Weight:      Height:        Intake/Output Summary (Last 24 hours) at 05/14/2023 1139 Last data filed at 05/14/2023 1104 Gross per 24 hour  Intake 6830 ml  Output 47829 ml  Net -42320 ml   Filed Weights   05/12/23 1439 05/14/23 0500 05/14/23 1020  Weight: 97.5 kg 90.7 kg 90.7 kg     Examination:  General exam: NAD Respiratory system: Clear to auscultation. Respiratory effort normal. Cardiovascular system: S1-S2, RRR, no murmurs, no pedal edema Gastrointestinal system: Soft, NT/ND, normal bowel sounds GU: Foley with CBI Central nervous system: Alert and oriented. No focal neurological deficits. Extremities: Symmetric 5 x 5 power. Skin: No rashes, lesions or ulcers Psychiatry: Judgement and insight appear normal. Mood & affect appropriate.     Data Reviewed: I have personally reviewed following labs and imaging studies  CBC: Recent Labs  Lab 05/12/23 1443 05/12/23 2248 05/13/23 0704 05/14/23 0805  WBC 9.9 12.3* 11.2* 9.8  NEUTROABS  --   --   --  5.8  HGB 15.1 13.1 12.8* 12.6*  HCT 46.7 39.6 37.6* 37.1*  MCV 93.2 91.7 90.8 91.8  PLT 321 273 264 263   Basic Metabolic Panel: Recent Labs  Lab 05/12/23 1443 05/12/23 2248 05/14/23 0805  NA 134* 136 138  K 3.7 4.4 3.9  CL 106 107 105  CO2 20* 21* 25  GLUCOSE 130* 170* 109*  BUN 19 22 14   CREATININE 1.31* 1.22 0.95  CALCIUM 9.3 9.0 8.5*   GFR: Estimated Creatinine Clearance: 81.7 mL/min (by C-G formula based on SCr of 0.95 mg/dL). Liver Function Tests: No results for input(s): "AST", "ALT", "ALKPHOS", "BILITOT", "PROT", "ALBUMIN" in the last 168 hours. No results for input(s): "LIPASE", "AMYLASE" in the last 168 hours. No results for input(s): "AMMONIA" in the last 168 hours. Coagulation Profile: Recent Labs  Lab 05/12/23 2105  INR 1.1   Cardiac Enzymes: No results for input(s): "CKTOTAL", "CKMB", "CKMBINDEX", "TROPONINI" in the last 168 hours. BNP (last 3 results) No results for input(s): "PROBNP" in the last 8760 hours. HbA1C: No results for input(s): "HGBA1C" in the last 72 hours. CBG: Recent Labs  Lab 05/13/23 1739 05/13/23 2125 05/14/23 0821 05/14/23 1023 05/14/23 1123  GLUCAP 129* 129* 95 95 103*   Lipid Profile: No results for input(s): "CHOL", "HDL", "LDLCALC",  "TRIG", "CHOLHDL", "LDLDIRECT" in the last 72 hours. Thyroid Function Tests: No results for input(s): "TSH", "T4TOTAL", "FREET4", "T3FREE", "THYROIDAB" in the last 72 hours. Anemia Panel: No results for input(s): "VITAMINB12", "FOLATE", "FERRITIN", "TIBC", "IRON", "RETICCTPCT" in the last 72 hours. Sepsis Labs: No results for input(s): "PROCALCITON", "LATICACIDVEN" in the last 168 hours.  Recent Results (from the past 240 hour(s))  Urine Culture     Status: Abnormal   Collection Time: 05/10/23  3:04 PM   Specimen: Urine  Result Value Ref Range Status   MICRO NUMBER: [id number]  Final   SPECIMEN QUALITY: Adequate  Final   Sample Source URINE  Final   STATUS: FINAL  Final   ISOLATE 1: Proteus mirabilis (A)  Final    Comment: Greater than 100,000 CFU/mL of Proteus mirabilis      Susceptibility   Proteus mirabilis - URINE CULTURE, REFLEX    AMOX/CLAVULANIC <=2 Sensitive     AMPICILLIN <=2 Sensitive     AMPICILLIN/SULBACTAM <=2 Sensitive     CEFAZOLIN* <=4 Not Reportable      * For infections other than uncomplicated UTI caused  by E. coli, K. pneumoniae or P. mirabilis: Cefazolin is resistant if MIC > or = 8 mcg/mL. (Distinguishing susceptible versus intermediate for isolates with MIC < or = 4 mcg/mL requires additional testing.) For uncomplicated UTI caused by E. coli, K. pneumoniae or P. mirabilis: Cefazolin is susceptible if MIC <32 mcg/mL and predicts susceptible to the oral agents cefaclor, cefdinir, cefpodoxime, cefprozil, cefuroxime, cephalexin and loracarbef.     CEFTAZIDIME <=1 Sensitive     CEFEPIME <=1 Sensitive     CEFTRIAXONE <=1 Sensitive     CIPROFLOXACIN <=0.25 Sensitive     LEVOFLOXACIN <=0.12 Sensitive     GENTAMICIN <=1 Sensitive     IMIPENEM 1 Sensitive     NITROFURANTOIN 128 Resistant     PIP/TAZO <=4 Sensitive     TOBRAMYCIN <=1 Sensitive     TRIMETH/SULFA* <=20 Sensitive      * For infections other than uncomplicated UTI caused by E. coli, K.  pneumoniae or P. mirabilis: Cefazolin is resistant if MIC > or = 8 mcg/mL. (Distinguishing susceptible versus intermediate for isolates with MIC < or = 4 mcg/mL requires additional testing.) For uncomplicated UTI caused by E. coli, K. pneumoniae or P. mirabilis: Cefazolin is susceptible if MIC <32 mcg/mL and predicts susceptible to the oral agents cefaclor, cefdinir, cefpodoxime, cefprozil, cefuroxime, cephalexin and loracarbef. Legend: S = Susceptible  I = Intermediate R = Resistant  NS = Not susceptible SDD = Susceptible Dose Dependent * = Not Tested  NR = Not Reported **NN = See Therapy Comments   MICROSCOPIC MESSAGE     Status: None   Collection Time: 05/10/23  3:04 PM  Result Value Ref Range Status   Note   Final    Comment: This urine was analyzed for the presence of WBC,  RBC, bacteria, casts, and other formed elements.  Only those elements seen were reported. . .          Radiology Studies: DG OR UROLOGY CYSTO IMAGE ([facility] ONLY)  Result Date: 05/14/2023 There is no interpretation for this exam.  This order is for images obtained during a surgical procedure.  Please See "Surgeries" Tab for more information regarding the procedure.   CT Renal Stone Study  Result Date: 05/12/2023 CLINICAL DATA:  Bilateral flank pain and hematuria for 1 week. Dysuria. EXAM: CT ABDOMEN AND PELVIS WITHOUT CONTRAST TECHNIQUE: Multidetector CT imaging of the abdomen and pelvis was performed following the standard protocol without IV contrast. RADIATION DOSE REDUCTION: This exam was performed according to the departmental dose-optimization program which includes automated exposure control, adjustment of the mA and/or kV according to patient size and/or use of iterative reconstruction technique. COMPARISON:  05/07/2022 FINDINGS: Lower chest: No acute findings. Hepatobiliary: No mass visualized on this unenhanced exam. Gallbladder is unremarkable. No evidence of biliary ductal dilatation.  Pancreas: No mass or inflammatory process visualized on this unenhanced exam. Spleen:  Within normal limits in size. Adrenals/Urinary tract: No evidence of urolithiasis. The urinary bladder is distended and contains a large amount of high attenuation material consistent with blood clot. Mild bilateral pelvicaliectasis and ureterectasis is seen to the level of the bladder, likely due to the distended state of the bladder. Stomach/Bowel: No evidence of obstruction, inflammatory process, or abnormal fluid collections. Vascular/Lymphatic: No pathologically enlarged lymph nodes identified. No evidence of abdominal aortic aneurysm. Reproductive:  Mild-to-moderately enlarged prostate gland. Other:  None. Musculoskeletal:  No suspicious bone lesions identified. IMPRESSION: Distended urinary bladder, which contains a large amount of blood clot. Mild bilateral hydronephrosis  and ureterectasis, likely due to the distended state of the bladder. No evidence of urolithiasis. Mild-to-moderately enlarged prostate. Electronically Signed   By: Danae Orleans M.Holmes.   On: 05/12/2023 16:13        Scheduled Meds:  [MAR Hold] atorvastatin  80 mg Oral QHS   [MAR Hold] carvedilol  6.25 mg Oral BID WC   [MAR Hold] cephALEXin  500 mg Oral TID   [MAR Hold] Chlorhexidine Gluconate Cloth  6 each Topical Daily   [MAR Hold] finasteride  5 mg Oral Daily   [MAR Hold] insulin aspart  0-5 Units Subcutaneous QHS   [MAR Hold] insulin aspart  0-9 Units Subcutaneous TID WC   [MAR Hold] nicotine  21 mg Transdermal Daily   [MAR Hold] sacubitril-valsartan  1 tablet Oral BID   [MAR Hold] spironolactone  25 mg Oral Daily   [MAR Hold] tamsulosin  0.4 mg Oral QPC supper   Continuous Infusions:  sodium chloride irrigation       LOS: 1 day      Tresa Moore, MD [facility]   If 7PM-7AM, please contact night-coverage  05/14/2023, 11:39 AM

## 2023-05-14 NOTE — Op Note (Signed)
Date of procedure: 05/14/23  Preoperative diagnosis:  hematuria   Postoperative diagnosis:  Same as above   Procedure: Cystoscopy Clot evacuation Fulguration of bleeding prostatic blood vessels  Surgeon: Vanna Scotland, MD  Anesthesia: MAC  Complications: None  Intraoperative findings: Overall small amount of clot burden, approximately 15 cc which was easily evacuated.  Massive prostamegaly with intravesical median lateral lobe component.  Small amount of bleeding from this.  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  EBL: Minimal  Drains: 24 French hematuria catheter with 75 cc in the balloon  Indication: Lawrence Holmes is a 67 y.o. patient with bleeding thought to be prostatic in origin has been on CBI for 48 hours.  He continues to bleed and as such, the decision was made for clot evacuation fulguration.  After reviewing the management options for treatment, he elected to proceed with the above surgical procedure(s). We have discussed the potential benefits and risks of the procedure, side effects of the proposed treatment, the likelihood of the patient achieving the goals of the procedure, and any potential problems that might occur during the procedure or recuperation. Informed consent has been obtained.  Description of procedure:  The patient was taken to the operating room and the patient was sedated under monitored anesthesia care.  The patient was placed in the dorsal lithotomy position, prepped and draped in the usual sterile fashion, and preoperative antibiotics were administered. A preoperative time-out was performed.   The previous CBI catheter was removed prior to prepping the patient.  I advanced a 79 French resectoscope using a blunt angled obturator which went easily into the bladder.  Inspection of the bladder revealed some mild mucosal irritation on the posterior bladder wall consistent with Foley catheter irritation.  He had some small yellowish hue and  bullous changes along the trigone consistent with cystitis cystica type changes.  He had massive prostamegaly with trilobar coaptation of which he had a significant intravesical and lateral lobe extension into the bladder itself.  There was a scant amount of bleeding noted from the vessels on the surface of the bladder neck on the prostatic mucosa.  The remainder of the bladder was completely unremarkable and there was no evidence of underlying tumor or malignancy.  There was a small amount of clot in the bladder which was easily evacuated.  Once this had been evacuated, used a bipolar button to fulgurate the bleeding prostatic vessels.  At this point in time, hemostasis was quite good I was able to turn continuous irrigation.  I then remove the resectoscope and replaced it with a 24 French hematuria catheter.  I hooked the patient back up to CBI but did not initiated as the urine was draining relatively clear/light pink.  I did feel the balloon was 75 cc in order to be able to place it on traction and should he start bleeding again.  The patient was then reversed from sedation and taken to the PACU in stable condition.  Plan: Only resume CBI if his urine becomes bloody.  Placed the catheter on traction as well if this happens and is the source of bleeding is likely prostatic in origin.  Will plan to hopefully remove the Foley catheter tomorrow.  Diet may be advanced.  Vanna Scotland, M.D.

## 2023-05-14 NOTE — Transfer of Care (Signed)
Immediate Anesthesia Transfer of Care Note  Patient: Lawrence Holmes [place]  Procedure(s) Performed: CYSTOSCOPY WITH CLOT EVACUATION AND POSSIBLE FULGERATION (Bladder)  Patient Location: PACU  Anesthesia Type:General  Level of Consciousness: drowsy  Airway & Oxygen Therapy: Patient Spontanous Breathing and Patient connected to face mask oxygen  Post-op Assessment: Report given to RN and Post -op Vital signs reviewed and stable  Post vital signs: Reviewed and stable  Last Vitals:  Vitals Value Taken Time  BP 91/51 05/14/23 1106  Temp    Pulse 70 05/14/23 1106  Resp 13 05/14/23 1106  SpO2 100 % 05/14/23 1106  Vitals shown include unfiled device data.  Last Pain:  Vitals:   05/14/23 1020  TempSrc: Temporal  PainSc: 0-No pain      Patients Stated Pain Goal: 0 (05/14/23 1020)  Complications: No notable events documented.

## 2023-05-14 NOTE — Interval H&P Note (Signed)
History and Physical Interval Note:  05/14/2023 10:17 AM  Lawrence Holmes  has presented today for surgery, with the diagnosis of Clot.  The various methods of treatment have been discussed with the patient and family. After consideration of risks, benefits and other options for treatment, the patient has consented to  Procedure(s): CYSTOSCOPY WITH CLOT EVACUATION AND POSSIBLE FULGERATION (N/A) as a surgical intervention.  The patient's history has been reviewed, patient examined, no change in status, stable for surgery.  I have reviewed the patient's chart and labs.  Questions were answered to the patient's satisfaction.     Vanna Scotland

## 2023-05-14 NOTE — Plan of Care (Signed)
Problem: Education: Goal: Knowledge of General Education information will improve Description: Including pain rating scale, medication(s)/side effects and non-pharmacologic comfort measures Outcome: Progressing   Problem: Health Behavior/Discharge Planning: Goal: Ability to manage health-related needs will improve Outcome: Progressing   Problem: Clinical Measurements: Goal: Diagnostic test results will improve Outcome: Progressing Goal: Cardiovascular complication will be avoided Outcome: Progressing   Problem: Nutrition: Goal: Adequate nutrition will be maintained Outcome: Progressing   Problem: Elimination: Goal: Will not experience complications related to bowel motility Outcome: Progressing   Problem: Pain Managment: Goal: General experience of comfort will improve Outcome: Progressing

## 2023-05-15 ENCOUNTER — Encounter: Payer: Self-pay | Admitting: Urology

## 2023-05-15 DIAGNOSIS — R310 Gross hematuria: Secondary | ICD-10-CM | POA: Diagnosis not present

## 2023-05-15 LAB — CBC WITH DIFFERENTIAL/PLATELET
Abs Immature Granulocytes: 0.03 10*3/uL (ref 0.00–0.07)
Basophils Absolute: 0.1 10*3/uL (ref 0.0–0.1)
Basophils Relative: 1 %
Eosinophils Absolute: 0.5 10*3/uL (ref 0.0–0.5)
Eosinophils Relative: 5 %
HCT: 38.2 % — ABNORMAL LOW (ref 39.0–52.0)
Hemoglobin: 12.7 g/dL — ABNORMAL LOW (ref 13.0–17.0)
Immature Granulocytes: 0 %
Lymphocytes Relative: 32 %
Lymphs Abs: 3.1 10*3/uL (ref 0.7–4.0)
MCH: 30.5 pg (ref 26.0–34.0)
MCHC: 33.2 g/dL (ref 30.0–36.0)
MCV: 91.6 fL (ref 80.0–100.0)
Monocytes Absolute: 0.6 10*3/uL (ref 0.1–1.0)
Monocytes Relative: 7 %
Neutro Abs: 5.5 10*3/uL (ref 1.7–7.7)
Neutrophils Relative %: 55 %
Platelets: 282 10*3/uL (ref 150–400)
RBC: 4.17 MIL/uL — ABNORMAL LOW (ref 4.22–5.81)
RDW: 13.4 % (ref 11.5–15.5)
WBC: 9.8 10*3/uL (ref 4.0–10.5)
nRBC: 0 % (ref 0.0–0.2)

## 2023-05-15 LAB — BASIC METABOLIC PANEL
Anion gap: 9 (ref 5–15)
BUN: 15 mg/dL (ref 8–23)
CO2: 21 mmol/L — ABNORMAL LOW (ref 22–32)
Calcium: 8.7 mg/dL — ABNORMAL LOW (ref 8.9–10.3)
Chloride: 105 mmol/L (ref 98–111)
Creatinine, Ser: 0.96 mg/dL (ref 0.61–1.24)
GFR, Estimated: >60 mL/min (ref >60)
Glucose, Bld: 120 mg/dL — ABNORMAL HIGH (ref 70–99)
Potassium: 3.9 mmol/L (ref 3.5–5.1)
Sodium: 135 mmol/L (ref 135–145)

## 2023-05-15 LAB — GLUCOSE, CAPILLARY
Glucose-Capillary: 121 mg/dL — ABNORMAL HIGH (ref 70–99)
Glucose-Capillary: 108 mg/dL — ABNORMAL HIGH (ref 70–99)
Glucose-Capillary: 97 mg/dL (ref 70–99)

## 2023-05-15 MED ORDER — FINASTERIDE 5 MG PO TABS: 5.0000 mg | ORAL_TABLET | Freq: Every day | ORAL | 0 refills | Status: DC

## 2023-05-15 NOTE — Plan of Care (Signed)
  Problem: Health Behavior/Discharge Planning: Goal: Ability to manage health-related needs will improve Outcome: Progressing   Problem: Metabolic: Goal: Ability to maintain appropriate glucose levels will improve Outcome: Progressing   Problem: Nutritional: Goal: Maintenance of adequate nutrition will improve Outcome: Progressing   Problem: Skin Integrity: Goal: Risk for impaired skin integrity will decrease Outcome: Progressing   Problem: Tissue Perfusion: Goal: Adequacy of tissue perfusion will improve Outcome: Progressing   Problem: Education: Goal: Knowledge of General Education information will improve Description: Including pain rating scale, medication(s)/side effects and non-pharmacologic comfort measures Outcome: Progressing   Problem: Health Behavior/Discharge Planning: Goal: Ability to manage health-related needs will improve Outcome: Progressing   Problem: Clinical Measurements: Goal: Will remain free from infection Outcome: Progressing Goal: Respiratory complications will improve Outcome: Progressing Goal: Cardiovascular complication will be avoided Outcome: Progressing   Problem: Activity: Goal: Risk for activity intolerance will decrease Outcome: Progressing   Problem: Nutrition: Goal: Adequate nutrition will be maintained Outcome: Progressing   Problem: Coping: Goal: Level of anxiety will decrease Outcome: Progressing   Problem: Elimination: Goal: Will not experience complications related to bowel motility Outcome: Progressing   Problem: Pain Managment: Goal: General experience of comfort will improve Outcome: Progressing   Problem: Safety: Goal: Ability to remain free from injury will improve Outcome: Progressing   Problem: Skin Integrity: Goal: Risk for impaired skin integrity will decrease Outcome: Progressing

## 2023-05-15 NOTE — Progress Notes (Signed)
Urology Inpatient Progress Note  Subjective: No acute events or night.  He is afebrile, VSS. Hemoglobin stable, 12.7.  Creatinine stable, 0.96. Foley catheter in place draining clear urine on slow drip CBI. He denies pain this morning.  Anti-infectives: Anti-infectives (From admission, onward)    Start     Dose/Rate Route Frequency Ordered Stop   05/12/23 2200  cephALEXin (KEFLEX) capsule 500 mg        500 mg Oral 3 times daily 05/12/23 1914         Current Facility-Administered Medications  Medication Dose Route Frequency Provider Last Rate Last Admin   acetaminophen (TYLENOL) tablet 650 mg  650 mg Oral Q6H PRN Jaynie Bream, RPH       atorvastatin (LIPITOR) tablet 80 mg  80 mg Oral QHS Lorretta Harp, MD   80 mg at 05/14/23 2114   carvedilol (COREG) tablet 6.25 mg  6.25 mg Oral BID WC Lorretta Harp, MD   6.25 mg at 05/15/23 0810   cephALEXin (KEFLEX) capsule 500 mg  500 mg Oral TID Lorretta Harp, MD   500 mg at 05/15/23 9528   Chlorhexidine Gluconate Cloth 2 % PADS 6 each  6 each Topical Daily Lolita Patella B, MD   6 each at 05/15/23 0911   finasteride (PROSCAR) tablet 5 mg  5 mg Oral Daily Kasandra Knudsen D, MD   5 mg at 05/15/23 4132   hydrALAZINE (APRESOLINE) injection 5 mg  5 mg Intravenous Q2H PRN Lorretta Harp, MD       insulin aspart (novoLOG) injection 0-5 Units  0-5 Units Subcutaneous QHS Lorretta Harp, MD       insulin aspart (novoLOG) injection 0-9 Units  0-9 Units Subcutaneous TID WC Lorretta Harp, MD   1 Units at 05/15/23 4401   nicotine (NICODERM CQ - dosed in mg/24 hours) patch 21 mg  21 mg Transdermal Daily Lorretta Harp, MD   21 mg at 05/15/23 0909   ondansetron (ZOFRAN) injection 4 mg  4 mg Intravenous Q8H PRN Lorretta Harp, MD       oxyCODONE-acetaminophen (PERCOCET) 7.5-325 MG per tablet 1 tablet  1 tablet Oral Q4H PRN Lorretta Harp, MD       sacubitril-valsartan (ENTRESTO) 24-26 mg per tablet  1 tablet Oral BID Lorretta Harp, MD   1 tablet at 05/15/23 0272   spironolactone  (ALDACTONE) tablet 25 mg  25 mg Oral Daily Lorretta Harp, MD   25 mg at 05/15/23 0909   tamsulosin (FLOMAX) capsule 0.4 mg  0.4 mg Oral QPC supper Kasandra Knudsen D, MD   0.4 mg at 05/14/23 1616   Objective: Vital signs in last 24 hours: Temp:  [97.3 F (36.3 C)-98.1 F (36.7 C)] 97.8 F (36.6 C) (09/24 0743) Pulse Rate:  [69-84] 81 (09/24 0743) Resp:  [12-20] 14 (09/24 0743) BP: (91-115)/(51-73) 115/63 (09/24 0743) SpO2:  [96 %-100 %] 97 % (09/24 0743)  Intake/Output from previous day: 09/23 0701 - 09/24 0700 In: 4630 [P.O.:480; I.V.:350] Out: 6850 [Urine:6850] Intake/Output this shift: Total I/O In: -  Out: 3000 [Urine:3000]  Physical Exam Vitals and nursing note reviewed.  Constitutional:      General: He is not in acute distress.    Appearance: He is not ill-appearing, toxic-appearing or diaphoretic.  HENT:     Head: Normocephalic and atraumatic.  Pulmonary:     Effort: Pulmonary effort is normal. No respiratory distress.  Skin:    General: Skin is warm and dry.  Neurological:     Mental  Status: He is alert and oriented to person, place, and time.  Psychiatric:        Mood and Affect: Mood normal.        Behavior: Behavior normal.    Lab Results:  Recent Labs    05/14/23 0805 05/15/23 0830  WBC 9.8 9.8  HGB 12.6* 12.7*  HCT 37.1* 38.2*  PLT 263 282   BMET Recent Labs    05/14/23 0805 05/15/23 0830  NA 138 135  K 3.9 3.9  CL 105 105  CO2 25 21*  GLUCOSE 109* 120*  BUN 14 15  CREATININE 0.95 0.96  CALCIUM 8.5* 8.7*   PT/INR Recent Labs    05/12/23 2105  LABPROT 14.8  INR 1.1   Studies/Results: DG OR UROLOGY CYSTO IMAGE ([facility] ONLY)  Result Date: 05/14/2023 There is no interpretation for this exam.  This order is for images obtained during a surgical procedure.  Please See "Surgeries" Tab for more information regarding the procedure.    Catheter Removal  Patient is present today for a catheter removal.  75ml of water was drained from the  balloon. A 24FR coude three-way hematuria foley cath was removed from the bladder, no complications were noted. Patient tolerated well.  Performed by: Carman Ching, PA-C   Assessment & Plan: 67 year old male with PMH BPH with LUTS admitted with urinary retention and gross hematuria with clot retention, now POD 1 from cystoscopy with clot evacuation and fulguration of bleeding prostatic vessels with Dr. Apolinar Junes.  Pertinent intraoperative findings include massive prostatomegaly with intravesical median lateral lobe.  Significant improvement in gross hematuria postoperatively.  I removed his Foley catheter and discontinued CBI at the bedside this morning, see procedure note above.  Will plan for voiding trial, and I have placed an order for postvoid bladder scan this afternoon.  If he requires Foley catheter replacement due to urinary retention, he will require a coud catheter given his massive prostatomegaly.  I will attempt Foley catheter placement myself if needed.  Will continue to monitor.  Carman Ching, PA-C 05/15/2023

## 2023-05-15 NOTE — Plan of Care (Signed)
Patient discharged per MD orders at this time.All dc instructions,educations and medications reviewed with the patient.Pt expressed understanding and will comply with dc instructions.follow up appointments was also communicated to the Pt.no verbal c/o or any ssx of distress.Pt was discharged home with self-care per order.Pt was transported home by brother in a privately owned vehicle.

## 2023-05-15 NOTE — Evaluation (Signed)
Occupational Therapy Evaluation Patient Details Name: Lawrence Holmes MRN: [id number] DOB: [birth year] Today's Date: 05/15/2023   History of Present Illness 67 y.o. male with PMHx significant of BPH, sCHF with EF 45-50%, HTN, HLD, DM, CKD-3a, smoker, recent L2-3 laminectomy and microdiscetomy in Aug 2024, who presented to ED with hematuria, flank pain, difficulty urinating and dysuria. Pt had cystoscopy yesterday 9/24 and found to have massive prostatomegaly with intravesical median lateral lobe.   Clinical Impression   Pt was seen for OT evaluation this date. Prior to August 2024 after his L2-3 laminectomy and microdiscetomy, pt was IND with all ADL/IADL and mobility tasks using no AD/AE/DME. He returned home from surgery with no therapy follow up and has been using a RW or SPC for all mobility since return home. He lives in a one level home with 3 STE with HR on L side. He has grab bars in his tub/shower console and by the toilet with an elevated commode. He reports never being left alone with family in/out and driving to fast food [place] immediately prior to this hospital admission. He reports being MOD I with ADL performance and having a 10# lifting restriction following his back surgery. He denies pain on entry to room and is seated in recliner with LPN present. He had just ambulated 160 feet x2 using RW with LPN in hallway. Pt presents to acute OT demonstrating ADL performance at MOD I. He has slight balance deficits and BLE weakness limiting his functional mobility and requiring need of RW for stability secondary to recent back surgery. (See OT problem list for additional functional deficits). Pt currently requires MOD/SUP for STS from recliner to RW and mobility in the room to perform toilet transfer using grab bars. He did ambulate x160 feet using RW with OT with CGA/SBA, as pt is noted to not fully clear L foot at all times. He does report pins/needles sensation in bil feet. Pt does not need any  follow up for acute OT, but would benefit from a PT eval and possible outpatient PT upon DC home with pt in agreement. He reports his smaller home environment has limited his mobility distance and this is the most he has walked since his back sugery.     If plan is discharge home, recommend the following: A little help with walking and/or transfers;Help with stairs or ramp for entrance    Functional Status Assessment  Patient has had a recent decline in their functional status and demonstrates the ability to make significant improvements in function in a reasonable and predictable amount of time.  Equipment Recommendations  None recommended by OT;Other (comment) (has needed equipment at home)    Recommendations for Other Services       Precautions / Restrictions Precautions Precaution Comments: recent L2-3 laminectomy and microdiscetomy in Aug 2024--no bending, twisting or lifting over 10 pounds      Mobility Bed Mobility                    Transfers Overall transfer level: Needs assistance Equipment used: Rolling walker (2 wheels) Transfers: Sit to/from Stand Sit to Stand: Supervision                  Balance Overall balance assessment: Mild deficits observed, not formally tested  ADL either performed or assessed with clinical judgement   ADL Overall ADL's : Modified independent                                       General ADL Comments: pt performed toilet transfer using RW and grab bar in bathroom with MOD I/SUP     Vision         Perception         Praxis         Pertinent Vitals/Pain Pain Assessment Pain Assessment: No/denies pain     Extremity/Trunk Assessment Upper Extremity Assessment Upper Extremity Assessment: Overall WFL for tasks assessed   Lower Extremity Assessment Lower Extremity Assessment: Defer to PT evaluation (noted to drag L foot at times/not  clear the floor fully)       Communication Communication Communication: No apparent difficulties   Cognition Arousal: Alert Behavior During Therapy: WFL for tasks assessed/performed Overall Cognitive Status: Within Functional Limits for tasks assessed                                       General Comments  found in recliner with chair alarm following x160 feet x2 mobility using RW with LPN    Exercises Other Exercises Other Exercises: provided education on use of AE/AD/DME e.g. reacher, shoe horn, etc however pt reports IND with ADLs and no need for these   Shoulder Instructions      Home Living Family/patient expects to be discharged to:: Private residence Living Arrangements: Spouse/significant other;Children Available Help at Discharge: Family Type of Home: House Home Access: Stairs to enter Secretary/administrator of Steps: 3 Entrance Stairs-Rails: Left Home Layout: One level     Bathroom Shower/Tub: Chief Strategy Officer: Handicapped height     Home Equipment: Agricultural consultant (2 wheels);Cane - single point;Grab bars - toilet;Grab bars - tub/shower          Prior Functioning/Environment Prior Level of Function : Independent/Modified Independent             Mobility Comments: no AD use prior to back surgery one month ago-Aug 2024 ADLs Comments: fully independent, working a full time job as a Engineer, 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        OT Problem List: Decreased activity tolerance      OT Treatment/Interventions:      OT Goals(Current goals can be found in the care plan section) Acute Rehab OT Goals Patient Stated Goal: go home  OT Frequency:      Co-evaluation              AM-PAC OT "6 Clicks" Daily Activity     Outcome Measure Help from another person eating meals?: None Help from another person taking care of personal grooming?: None Help from another person toileting, which includes using  toliet, bedpan, or urinal?: A Little Help from another person bathing (including washing, rinsing, drying)?: A Little Help from another person to put on and taking off regular upper body clothing?: None Help from another person to put on and taking off regular lower body clothing?: A Little 6 Click Score: 21   End of Session Equipment Utilized During Treatment: Gait belt;Rolling walker (2 wheels) Nurse Communication: Mobility status  Activity Tolerance: Patient tolerated treatment well Patient left:  in chair;with chair alarm set  OT Visit Diagnosis: Unsteadiness on feet (R26.81);Muscle weakness (generalized) (M62.81);Other abnormalities of gait and mobility (R26.89)                Time: 2841-3244 OT Time Calculation (min): 27 min Charges:  OT General Charges $OT Visit: 1 Visit OT Evaluation $OT Eval Low Complexity: 1 Low OT Treatments $Self Care/Home Management : 8-22 mins  Pilar Corrales, OTR/L 05/15/2023, 12:48 PM

## 2023-05-15 NOTE — Discharge Summary (Signed)
Physician Discharge Summary  Zygmunt D [place] YWV:[id number] DOB: 01-Mar-1956 DOA: 05/12/2023  PCP: Danelle Berry, PA-C  Admit date: 05/12/2023 Discharge date: 05/15/2023  Admitted From: Home Disposition:  Home  Recommendations for Outpatient Follow-up:  Follow up with PCP in 1-2 weeks Follow up with urology as directed  Home Health:No  Equipment/Devices:None   Discharge Condition:Stable  CODE STATUS:FULL  Diet recommendation: Heart/carb  Brief/Interim Summary:  67 y.o. male with medical history significant of BPH, sCHF with EF 45-50%, HTN, HLD, DM, BPH, CKD-3a, smoker, who presents with hematuria, flank pain, difficulty urinating and dysuria.   Patient states that he has blood in urine, bilateral flank pain, dysuria, burning on urination for more than 3 days.  The flank pain is constant, aching, mild, nonradiating, not alleviated or aggravated by any known factors.  He was seen by PCP on 9/19 and was diagnosed with UTI, started on Keflex.  Patient states that he feels better in terms of dysuria and burning on urination, but he has developed difficulty urinating in the past 12 hours.  No fever or chills. Patient denies chest pain, cough, shortness of breath.  No nausea, vomiting, diarrhea or abdominal pain.   9/22: Urology consulted.  CBI initiated.  On 9/22 urine still running pink.  Discussed with urology.  Possible plan for surgical intervention 9/23.   9/23: Seen by urology this a.m.  Taken to OR for cystoscopy, clot evacuation, fulguration of bleeding prostatic vessels.  9/24: Patient voiding s/p foley removal.  Appropriate for dc home.  DC on flomax and proscar.  FU OP urology.    Discharge Diagnoses:  Principal Problem:   Hematuria Active Problems:   Acute urinary retention   Bilateral hydronephrosis   Chronic systolic CHF (congestive heart failure) (HCC)   UTI (urinary tract infection)   CAD (coronary artery disease)   BPH (benign prostatic hyperplasia)   HTN  (hypertension)   HLD (hyperlipidemia)   Type II diabetes mellitus with renal manifestations (HCC)   Chronic kidney disease, stage 3a (HCC)   Tobacco use    Hematuria, acute urinary retention, and bilateral hydronephrosis: Foley catheter placed.  CBI initiated.   Plan: Status post cystoscopy, clot evacuation, fulguration of bleeding prostatic vessels.  Hb stable post procedure.  Foley d/c'd.  Appropriate for dc home.  DC on flomax and proscar   UTI (urinary tract infection): Patient had positive urinalysis on 9/19 with turbid appearance, 3+ leukocyte, positive nitrite, many bacteria, WBC 10-20.  Patient is on Keflex Plan: Continue Keflex, last dose 9/26     Chronic systolic CHF (congestive heart failure) (HCC): 2D echo on 12/09/2021 showed EF of 45-50% with grade 1 diastolic dysfunction.  Patient does not have leg edema or shortness of breath.  CHF seem to be compensated. Plan: Continue spironolactone     BPH (benign prostatic hyperplasia): With obstruction. -Flomax and Proscar   CAD: with hx of STEMI. No CP -Restart ASA -continue lipitor   HTN (hypertension) -Coreg, spironolactone   HLD (hyperlipidemia) -Lipitor   Type II diabetes mellitus with renal manifestations [facility]): Recent A1c 6.7, well-controlled.  Patent is taking Jardiance and metformin    Chronic kidney disease, stage 3a (HCC): Slightly worsening than baseline. -Spironolactone resumed   Tobacco use -Nicotine patch -Encourage quitting smoking   Discharge Instructions  Discharge Instructions     Diet - low sodium heart healthy   Complete by: As directed    Increase activity slowly   Complete by: As directed       Allergies  as of 05/15/2023   No Known Allergies      Medication List     STOP taking these medications    baclofen 10 MG tablet Commonly known as: LIORESAL   oxyCODONE-acetaminophen 7.5-325 MG tablet Commonly known as: Percocet   senna 8.6 MG Tabs tablet Commonly known as:  SENOKOT   sildenafil 20 MG tablet Commonly known as: REVATIO       TAKE these medications    Accu-Chek Guide test strip Generic drug: glucose blood SMARTSIG:Via Meter 1-4 Times Daily   Accu-Chek Softclix Lancets lancets SMARTSIG:Via Meter 1-4 Times Daily   aspirin 81 MG chewable tablet Chew 1 tablet (81 mg total) by mouth daily.   atorvastatin 80 MG tablet Commonly known as: LIPITOR Take 1 tablet (80 mg total) by mouth at bedtime. Overdue follow up appointment.  PLEASE CALL OFFICE TO SCHEDULE APPOINTMENT PRIOR TO NEXT REFILL   BLOOD GLUCOSE METER DISPOSABLE Devi Check fingerstick blood sugars once a day; E11.69, LON 99 months   blood glucose meter kit and supplies Kit Dispense based on patient and insurance preference. Use up to four times daily as directed. (FOR ICD-9 250.00, 250.01).   carvedilol 6.25 MG tablet Commonly known as: COREG Take 1 tablet (6.25 mg total) by mouth 2 (two) times daily with a meal.   cephALEXin 500 MG capsule Commonly known as: KEFLEX Take 1 capsule (500 mg total) by mouth 3 (three) times daily for 7 days.   empagliflozin 10 MG Tabs tablet Commonly known as: Jardiance Take 1 tablet (10 mg total) by mouth daily before breakfast.   Entresto 24-26 MG Generic drug: sacubitril-valsartan TAKE 1 TABLET BY MOUTH TWICE DAILY   finasteride 5 MG tablet Commonly known as: PROSCAR Take 1 tablet (5 mg total) by mouth daily. Start taking on: May 16, 2023   metFORMIN 500 MG tablet Commonly known as: GLUCOPHAGE Take 1 tablet (500 mg total) by mouth 2 (two) times daily with a meal.   spironolactone 25 MG tablet Commonly known as: ALDACTONE TAKE 1 TABLET(25 MG) BY MOUTH DAILY   tamsulosin 0.4 MG Caps capsule Commonly known as: FLOMAX TAKE 1 CAPSULE(0.4 MG) BY MOUTH DAILY        No Known Allergies  Consultations: Urology   Procedures/Studies: DG OR UROLOGY CYSTO IMAGE ([facility] ONLY)  Result Date: 05/14/2023 There is no  interpretation for this exam.  This order is for images obtained during a surgical procedure.  Please See "Surgeries" Tab for more information regarding the procedure.   CT Renal Stone Study  Result Date: 05/12/2023 CLINICAL DATA:  Bilateral flank pain and hematuria for 1 week. Dysuria. EXAM: CT ABDOMEN AND PELVIS WITHOUT CONTRAST TECHNIQUE: Multidetector CT imaging of the abdomen and pelvis was performed following the standard protocol without IV contrast. RADIATION DOSE REDUCTION: This exam was performed according to the departmental dose-optimization program which includes automated exposure control, adjustment of the mA and/or kV according to patient size and/or use of iterative reconstruction technique. COMPARISON:  05/07/2022 FINDINGS: Lower chest: No acute findings. Hepatobiliary: No mass visualized on this unenhanced exam. Gallbladder is unremarkable. No evidence of biliary ductal dilatation. Pancreas: No mass or inflammatory process visualized on this unenhanced exam. Spleen:  Within normal limits in size. Adrenals/Urinary tract: No evidence of urolithiasis. The urinary bladder is distended and contains a large amount of high attenuation material consistent with blood clot. Mild bilateral pelvicaliectasis and ureterectasis is seen to the level of the bladder, likely due to the distended state of the bladder. Stomach/Bowel:  No evidence of obstruction, inflammatory process, or abnormal fluid collections. Vascular/Lymphatic: No pathologically enlarged lymph nodes identified. No evidence of abdominal aortic aneurysm. Reproductive:  Mild-to-moderately enlarged prostate gland. Other:  None. Musculoskeletal:  No suspicious bone lesions identified. IMPRESSION: Distended urinary bladder, which contains a large amount of blood clot. Mild bilateral hydronephrosis and ureterectasis, likely due to the distended state of the bladder. No evidence of urolithiasis. Mild-to-moderately enlarged prostate. Electronically  Signed   By: Danae Orleans M.D.   On: 05/12/2023 16:13      Subjective: Seen and examined.  Stable, no distress.  Appropriate for dc home  Discharge Exam: Vitals:   05/14/23 2334 05/15/23 0743  BP: 106/61 115/63  Pulse: 84 81  Resp: 20 14  Temp: 98.1 F (36.7 C) 97.8 F (36.6 C)  SpO2: 97% 97%   Vitals:   05/14/23 1130 05/14/23 1604 05/14/23 2334 05/15/23 0743  BP: 111/66 114/73 106/61 115/63  Pulse: 72 81 84 81  Resp: 17 19 20 14   Temp: (!) 97.3 F (36.3 C)  98.1 F (36.7 C) 97.8 F (36.6 C)  TempSrc:   Oral   SpO2: 96% 96% 97% 97%  Weight:      Height:        General: Pt is alert, awake, not in acute distress Cardiovascular: RRR, S1/S2 +, no rubs, no gallops Respiratory: CTA bilaterally, no wheezing, no rhonchi Abdominal: Soft, NT, ND, bowel sounds + Extremities: no edema, no cyanosis    The results of significant diagnostics from this hospitalization (including imaging, microbiology, ancillary and laboratory) are listed below for reference.     Microbiology: Recent Results (from the past 240 hour(s))  Urine Culture     Status: Abnormal   Collection Time: 05/10/23  3:04 PM   Specimen: Urine  Result Value Ref Range Status   MICRO NUMBER: [id number]  Final   SPECIMEN QUALITY: Adequate  Final   Sample Source URINE  Final   STATUS: FINAL  Final   ISOLATE 1: Proteus mirabilis (A)  Final    Comment: Greater than 100,000 CFU/mL of Proteus mirabilis      Susceptibility   Proteus mirabilis - URINE CULTURE, REFLEX    AMOX/CLAVULANIC <=2 Sensitive     AMPICILLIN <=2 Sensitive     AMPICILLIN/SULBACTAM <=2 Sensitive     CEFAZOLIN* <=4 Not Reportable      * For infections other than uncomplicated UTI caused by E. coli, K. pneumoniae or P. mirabilis: Cefazolin is resistant if MIC > or = 8 mcg/mL. (Distinguishing susceptible versus intermediate for isolates with MIC < or = 4 mcg/mL requires additional testing.) For uncomplicated UTI caused by E. coli, K. pneumoniae  or P. mirabilis: Cefazolin is susceptible if MIC <32 mcg/mL and predicts susceptible to the oral agents cefaclor, cefdinir, cefpodoxime, cefprozil, cefuroxime, cephalexin and loracarbef.     CEFTAZIDIME <=1 Sensitive     CEFEPIME <=1 Sensitive     CEFTRIAXONE <=1 Sensitive     CIPROFLOXACIN <=0.25 Sensitive     LEVOFLOXACIN <=0.12 Sensitive     GENTAMICIN <=1 Sensitive     IMIPENEM 1 Sensitive     NITROFURANTOIN 128 Resistant     PIP/TAZO <=4 Sensitive     TOBRAMYCIN <=1 Sensitive     TRIMETH/SULFA* <=20 Sensitive      * For infections other than uncomplicated UTI caused by E. coli, K. pneumoniae or P. mirabilis: Cefazolin is resistant if MIC > or = 8 mcg/mL. (Distinguishing susceptible versus intermediate for isolates with MIC <  or = 4 mcg/mL requires additional testing.) For uncomplicated UTI caused by E. coli, K. pneumoniae or P. mirabilis: Cefazolin is susceptible if MIC <32 mcg/mL and predicts susceptible to the oral agents cefaclor, cefdinir, cefpodoxime, cefprozil, cefuroxime, cephalexin and loracarbef. Legend: S = Susceptible  I = Intermediate R = Resistant  NS = Not susceptible SDD = Susceptible Dose Dependent * = Not Tested  NR = Not Reported **NN = See Therapy Comments   MICROSCOPIC MESSAGE     Status: None   Collection Time: 05/10/23  3:04 PM  Result Value Ref Range Status   Note   Final    Comment: This urine was analyzed for the presence of WBC,  RBC, bacteria, casts, and other formed elements.  Only those elements seen were reported. . .      Labs: BNP (last 3 results) Recent Labs    05/12/23 1443  BNP 49.6   Basic Metabolic Panel: Recent Labs  Lab 05/12/23 1443 05/12/23 2248 05/14/23 0805 05/15/23 0830  NA 134* 136 138 135  K 3.7 4.4 3.9 3.9  CL 106 107 105 105  CO2 20* 21* 25 21*  GLUCOSE 130* 170* 109* 120*  BUN 19 22 14 15   CREATININE 1.31* 1.22 0.95 0.96  CALCIUM 9.3 9.0 8.5* 8.7*   Liver Function Tests: No results for  input(s): "AST", "ALT", "ALKPHOS", "BILITOT", "PROT", "ALBUMIN" in the last 168 hours. No results for input(s): "LIPASE", "AMYLASE" in the last 168 hours. No results for input(s): "AMMONIA" in the last 168 hours. CBC: Recent Labs  Lab 05/12/23 1443 05/12/23 2248 05/13/23 0704 05/14/23 0805 05/15/23 0830  WBC 9.9 12.3* 11.2* 9.8 9.8  NEUTROABS  --   --   --  5.8 5.5  HGB 15.1 13.1 12.8* 12.6* 12.7*  HCT 46.7 39.6 37.6* 37.1* 38.2*  MCV 93.2 91.7 90.8 91.8 91.6  PLT 321 273 264 263 282   Cardiac Enzymes: No results for input(s): "CKTOTAL", "CKMB", "CKMBINDEX", "TROPONINI" in the last 168 hours. BNP: Invalid input(s): "POCBNP" CBG: Recent Labs  Lab 05/14/23 1123 05/14/23 1609 05/14/23 2143 05/15/23 0744 05/15/23 1200  GLUCAP 103* 152* 108* 121* 108*   D-Dimer No results for input(s): "DDIMER" in the last 72 hours. Hgb A1c No results for input(s): "HGBA1C" in the last 72 hours. Lipid Profile No results for input(s): "CHOL", "HDL", "LDLCALC", "TRIG", "CHOLHDL", "LDLDIRECT" in the last 72 hours. Thyroid function studies No results for input(s): "TSH", "T4TOTAL", "T3FREE", "THYROIDAB" in the last 72 hours.  Invalid input(s): "FREET3" Anemia work up No results for input(s): "VITAMINB12", "FOLATE", "FERRITIN", "TIBC", "IRON", "RETICCTPCT" in the last 72 hours. Urinalysis    Component Value Date/Time   COLORURINE ORANGE (A) 05/10/2023 1504   APPEARANCEUR TURBID (A) 05/10/2023 1504   LABSPEC 1.024 05/10/2023 1504   PHURINE > OR = 9.0 (A) 05/10/2023 1504   GLUCOSEU 1+ (A) 05/10/2023 1504   HGBUR 2+ (A) 05/10/2023 1504   BILIRUBINUR NEGATIVE 04/02/2023 1650   KETONESUR NEGATIVE 05/10/2023 1504   PROTEINUR 3+ (A) 05/10/2023 1504   NITRITE POSITIVE (A) 05/10/2023 1504   LEUKOCYTESUR 3+ (A) 05/10/2023 1504   Sepsis Labs Recent Labs  Lab 05/12/23 2248 05/13/23 0704 05/14/23 0805 05/15/23 0830  WBC 12.3* 11.2* 9.8 9.8   Microbiology Recent Results (from the past 240  hour(s))  Urine Culture     Status: Abnormal   Collection Time: 05/10/23  3:04 PM   Specimen: Urine  Result Value Ref Range Status   MICRO NUMBER: [id number]  Final  SPECIMEN QUALITY: Adequate  Final   Sample Source URINE  Final   STATUS: FINAL  Final   ISOLATE 1: Proteus mirabilis (A)  Final    Comment: Greater than 100,000 CFU/mL of Proteus mirabilis      Susceptibility   Proteus mirabilis - URINE CULTURE, REFLEX    AMOX/CLAVULANIC <=2 Sensitive     AMPICILLIN <=2 Sensitive     AMPICILLIN/SULBACTAM <=2 Sensitive     CEFAZOLIN* <=4 Not Reportable      * For infections other than uncomplicated UTI caused by E. coli, K. pneumoniae or P. mirabilis: Cefazolin is resistant if MIC > or = 8 mcg/mL. (Distinguishing susceptible versus intermediate for isolates with MIC < or = 4 mcg/mL requires additional testing.) For uncomplicated UTI caused by E. coli, K. pneumoniae or P. mirabilis: Cefazolin is susceptible if MIC <32 mcg/mL and predicts susceptible to the oral agents cefaclor, cefdinir, cefpodoxime, cefprozil, cefuroxime, cephalexin and loracarbef.     CEFTAZIDIME <=1 Sensitive     CEFEPIME <=1 Sensitive     CEFTRIAXONE <=1 Sensitive     CIPROFLOXACIN <=0.25 Sensitive     LEVOFLOXACIN <=0.12 Sensitive     GENTAMICIN <=1 Sensitive     IMIPENEM 1 Sensitive     NITROFURANTOIN 128 Resistant     PIP/TAZO <=4 Sensitive     TOBRAMYCIN <=1 Sensitive     TRIMETH/SULFA* <=20 Sensitive      * For infections other than uncomplicated UTI caused by E. coli, K. pneumoniae or P. mirabilis: Cefazolin is resistant if MIC > or = 8 mcg/mL. (Distinguishing susceptible versus intermediate for isolates with MIC < or = 4 mcg/mL requires additional testing.) For uncomplicated UTI caused by E. coli, K. pneumoniae or P. mirabilis: Cefazolin is susceptible if MIC <32 mcg/mL and predicts susceptible to the oral agents cefaclor, cefdinir, cefpodoxime, cefprozil, cefuroxime, cephalexin and  loracarbef. Legend: S = Susceptible  I = Intermediate R = Resistant  NS = Not susceptible SDD = Susceptible Dose Dependent * = Not Tested  NR = Not Reported **NN = See Therapy Comments   MICROSCOPIC MESSAGE     Status: None   Collection Time: 05/10/23  3:04 PM  Result Value Ref Range Status   Note   Final    Comment: This urine was analyzed for the presence of WBC,  RBC, bacteria, casts, and other formed elements.  Only those elements seen were reported. . .      Time coordinating discharge: Over 30 minutes  SIGNED:   Tresa Moore, MD  [facility] 05/15/2023, 3:00 PM Pager   If 7PM-7AM, please contact night-coverage

## 2023-05-15 NOTE — Progress Notes (Signed)
Brief Urology Progress Note  Foley catheter removed in the morning.  He has been able to void multiple times but serial bladder scans are showing residuals of around 400 mL.  We elected for Foley catheter replacement, see below.  Will schedule him for outpatient follow-up with Dr. Richardo Hanks next week to discuss HOLEP in light of his massive prostatomegaly and gross hematuria.  He is in agreement with this plan.  Simple Catheter Placement  Due to urinary retention patient is present today for a foley cath placement.  Patient was cleaned and prepped in a sterile fashion with betadine and 2% lidocaine jelly was instilled into the urethra. A 18 coude FR foley catheter was inserted, urine return was noted  , urine was clear in color.  The balloon was filled with 30cc of sterile water.  A leg bag was attached for drainage. Patient was given instruction on proper catheter care.  Patient tolerated well, no complications were noted   Performed by: Carman Ching, PA-C and Michiel Cowboy, PA-C  Carman Ching, PA-C 05/15/23  5:19 PM

## 2023-05-15 NOTE — Evaluation (Signed)
Physical Therapy Evaluation Patient Details Name: Lawrence Holmes MRN: [id number] DOB: October 26, 1955 Today's Date: 05/15/2023  History of Present Illness  Pt is a 67 y.o. male presenting to hospital 05/12/23 with c/o hematuria and abdominal pain.  Pt admitted with hematuria, acute urinary retention, B hydronephrosis, UTI.  S/p 05/14/23 cystoscopy, clot evacuation, and fulguration of bleeding prostatic blood vessels.  PMH includes HLD, diverticulosis, DM type 2, CAD, htn, BPH with LUTS, CHF, L inguinal hernia, STEMI.  Clinical Impression  Prior to hospital admission, pt was modified independent with ambulation (most recently transitioning himself from RW to [facility]); lives in a 1 level home with 3 STE L railing.  No c/o pain during session.  Currently pt is SBA with transfers and ambulation 200 feet with RW use.  During ambulation, decreased L foot DF and L foot clearance noted; L LE appearing ataxic; steady with RW use.  Pt would currently benefit from skilled PT to address noted impairments and functional limitations (see below for any additional details).  Upon hospital discharge, pt would benefit from ongoing therapy.    If plan is discharge home, recommend the following: Assist for transportation;Help with stairs or ramp for entrance   Can travel by private vehicle    Yes    Equipment Recommendations Rolling walker (2 wheels);Other (comment) (pt has RW and SPC at home already)  Recommendations for Other Services       Functional Status Assessment Patient has had a recent decline in their functional status and demonstrates the ability to make significant improvements in function in a reasonable and predictable amount of time.     Precautions / Restrictions Precautions Precautions: Fall Precaution Comments: Recent L2-3 laminectomy and microdiscetomy in Aug 2024--no bending, twisting or lifting over 10 pounds Restrictions Weight Bearing Restrictions: No      Mobility  Bed Mobility Overal bed  mobility: Independent Bed Mobility: Sit to Supine       Sit to supine: Independent   General bed mobility comments: No difficulties noted    Transfers Overall transfer level: Needs assistance Equipment used: Rolling walker (2 wheels) Transfers: Sit to/from Stand Sit to Stand: Supervision           General transfer comment: steady transfer from recliner    Ambulation/Gait Ambulation/Gait assistance: Supervision Gait Distance (Feet): 200 Feet Assistive device: Rolling walker (2 wheels) Gait Pattern/deviations: Step-through pattern Gait velocity: decreased     General Gait Details: decreased L foot DF and decreased L foot clearance; L LE appearing ataxic; steady with RW use  Stairs            Wheelchair Mobility     Tilt Bed    Modified Rankin (Stroke Patients Only)       Balance Overall balance assessment: Needs assistance Sitting-balance support: No upper extremity supported, Feet supported Sitting balance-Leahy Scale: Good Sitting balance - Comments: steady reaching outside BOS   Standing balance support: Bilateral upper extremity supported, Reliant on assistive device for balance Standing balance-Leahy Scale: Good Standing balance comment: steady ambulating with RW use                             Pertinent Vitals/Pain Pain Assessment Pain Assessment: No/denies pain    Home Living Family/patient expects to be discharged to:: Private residence Living Arrangements: Spouse/significant other;Children Available Help at Discharge: Family Type of Home: House Home Access: Stairs to enter Entrance Stairs-Rails: Left Entrance Stairs-Number of Steps: 3   Home  Layout: One level Home Equipment: Agricultural consultant (2 wheels);Cane - single point;Grab bars - toilet;Grab bars - tub/shower      Prior Function Prior Level of Function : Independent/Modified Independent             Mobility Comments: No AD use prior to back surgery one month  ago-Aug 2024; pt reports he has been trying to wean himself off walker to cane most recently ADLs Comments: Per OT eval "fully independent, working a full time job as a Engineer, 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"     Extremity/Trunk Assessment   Upper Extremity Assessment Upper Extremity Assessment: Overall WFL for tasks assessed    Lower Extremity Assessment Lower Extremity Assessment: LLE deficits/detail;RLE deficits/detail RLE Deficits / Details: 4+/5 hip flexion, knee flexion/extension, and DF LLE Deficits / Details: 3+/5 hip flexion; 4+/5 knee flexion/extension and DF LLE Coordination: decreased gross motor    Cervical / Trunk Assessment Cervical / Trunk Assessment: Normal  Communication   Communication Communication: No apparent difficulties Cueing Techniques: Verbal cues  Cognition Arousal: Alert Behavior During Therapy: WFL for tasks assessed/performed Overall Cognitive Status: Within Functional Limits for tasks assessed                                          General Comments Nursing cleared pt for participation in physical therapy.  Pt agreeable to PT session.    Exercises     Assessment/Plan    PT Assessment Patient needs continued PT services  PT Problem List Decreased strength;Decreased activity tolerance;Decreased balance;Decreased mobility       PT Treatment Interventions DME instruction;Gait training;Stair training;Functional mobility training;Therapeutic activities;Therapeutic exercise;Balance training;Patient/family education    PT Goals (Current goals can be found in the Care Plan section)  Acute Rehab PT Goals Patient Stated Goal: to improve mobility PT Goal Formulation: With patient Time For Goal Achievement: 05/29/23 Potential to Achieve Goals: Good    Frequency Min 1X/week     Co-evaluation               AM-PAC PT "6 Clicks" Mobility  Outcome Measure Help needed turning from your back to your side  while in a flat bed without using bedrails?: None Help needed moving from lying on your back to sitting on the side of a flat bed without using bedrails?: None Help needed moving to and from a bed to a chair (including a wheelchair)?: None Help needed standing up from a chair using your arms (e.g., wheelchair or bedside chair)?: None Help needed to walk in hospital room?: A Little Help needed climbing 3-5 steps with a railing? : A Little 6 Click Score: 22    End of Session Equipment Utilized During Treatment: Gait belt Activity Tolerance: Patient tolerated treatment well Patient left: in bed;with call bell/phone within reach;with bed alarm set;with nursing/sitter in room (NT present to perform bladder scan and reported she would set pt up when she was done) Nurse Communication: Mobility status;Precautions;Other (comment) (Pt used urinal and blood clots and red urine noted) PT Visit Diagnosis: Other abnormalities of gait and mobility (R26.89);Muscle weakness (generalized) (M62.81)    Time: 2952-8413 PT Time Calculation (min) (ACUTE ONLY): 18 min   Charges:   PT Evaluation $PT Eval Low Complexity: 1 Low   PT General Charges $$ ACUTE PT VISIT: 1 Visit        Hendricks Limes, PT 05/15/23,  2:49 PM

## 2023-05-16 ENCOUNTER — Encounter: Payer: Self-pay | Admitting: Neurosurgery

## 2023-05-16 ENCOUNTER — Telehealth: Payer: Self-pay

## 2023-05-16 ENCOUNTER — Ambulatory Visit (INDEPENDENT_AMBULATORY_CARE_PROVIDER_SITE_OTHER): Payer: 59 | Admitting: Neurosurgery

## 2023-05-16 VITALS — BP 110/80 | Ht 72.0 in | Wt 205.0 lb

## 2023-05-16 DIAGNOSIS — M5106 Intervertebral disc disorders with myelopathy, lumbar region: Secondary | ICD-10-CM

## 2023-05-16 DIAGNOSIS — Z09 Encounter for follow-up examination after completed treatment for conditions other than malignant neoplasm: Secondary | ICD-10-CM

## 2023-05-16 DIAGNOSIS — Z9889 Other specified postprocedural states: Secondary | ICD-10-CM | POA: Insufficient documentation

## 2023-05-16 NOTE — Progress Notes (Signed)
   REFERRING PHYSICIAN:  Danelle Berry, Pa-c [address] [place],  [place] [address]  DOS: 04/05/23  Right L2-L3 Laminectomy and microdiscectomy   HISTORY OF PRESENT ILLNESS: Lawrence Holmes is approximately 6 weeks status post Right L2-L3 Laminectomy and microdiscectomy .  He states that he has graduated from his wheelchair and is almost only using his walker, trying to graduate to his cane.  He is ambitious to get started with his physical therapy.  Overall he does feel a significant improvement in his strength.  He is also starting to note return of sensation in the bottom of his foot.  PHYSICAL EXAMINATION:  General: Patient is well developed, well nourished, calm, collected, and in no apparent distress.   NEUROLOGICAL:  General: In no acute distress.   Awake, alert, oriented to person, place, and time.  Pupils equal round and reactive to light.  Facial tone is symmetric.     Strength:           Side Iliopsoas Quads Hamstring PF DF EHL  R 4+ 4+ 4+ 4+ 4+ 4+  L 4+ 4+ 4+ 4 4 2    Incision c/d/i   ROS (Neurologic):  Negative except as noted above  IMAGING: Nothing new to review.   ASSESSMENT/PLAN:  Lawrence Holmes is doing well s/p above surgery. Treatment options reviewed with patient and following plan made:   -He can increase his weight to 25 pounds -He can go forward with his physical therapy, he is anxious to get this started as he is noticing improvement in his strength and sensation. -Would like to follow-up with him at his 49-month appointment.  Lovenia Kim MD/MSCR Neurosurgery

## 2023-05-16 NOTE — Transitions of Care (Post Inpatient/ED Visit) (Signed)
05/16/2023  Name: Lawrence Holmes [place] MRN: [id number] DOB: [birth year]  Today's TOC FU Call Status: Today's TOC FU Call Status:: Successful TOC FU Call Completed TOC FU Call Complete Date: 05/16/23 Patient's Name and Date of Birth confirmed.  Transition Care Management Follow-up Telephone Call Date of Discharge: 05/15/23 Discharge Facility: [facility]) Type of Discharge: Inpatient Admission Primary Inpatient Discharge Diagnosis:: urine retention How have you been since you were released from the hospital?: Better Any questions or concerns?: No  Items Reviewed: Did you receive and understand the discharge instructions provided?: Yes Medications obtained,verified, and reconciled?: Yes (Medications Reviewed) Any new allergies since your discharge?: No Dietary orders reviewed?: Yes Do you have support at home?: Yes People in Home: spouse  Medications Reviewed Today: Medications Reviewed Today     Reviewed by Karena Addison, LPN (Licensed Practical Nurse) on 05/16/23 at 0932  Med List Status: <None>   Medication Order Taking? Sig Documenting Provider Last Dose Status Informant  ACCU-CHEK GUIDE test strip [id number] No SMARTSIG:Via Meter 1-4 Times Daily [provider] Past Month Active Self  Accu-Chek Softclix Lancets lancets [id number] No SMARTSIG:Via Meter 1-4 Times Daily [provider] Past Month Active Self  aspirin 81 MG chewable tablet [id number] No Chew 1 tablet (81 mg total) by mouth daily. Shaune Pollack, MD 05/11/2023 Active Self  atorvastatin (LIPITOR) 80 MG tablet [id number] No Take 1 tablet (80 mg total) by mouth at bedtime. Overdue follow up appointment.  PLEASE CALL OFFICE TO SCHEDULE APPOINTMENT PRIOR TO NEXT REFILL Iran Ouch, MD 05/11/2023 Active Self  Blood Gluc Meter Disp-Strips (BLOOD GLUCOSE METER DISPOSABLE) DEVI [id number] No Check fingerstick blood sugars once a day; E11.69, LON 99 months Lada, Janit Bern, MD Past Month  Active Self  blood glucose meter kit and supplies KIT [id number] No Dispense based on patient and insurance preference. Use up to four times daily as directed. (FOR ICD-9 250.00, 250.01). Danelle Berry, PA-C Past Month Active Self  carvedilol (COREG) 6.25 MG tablet [id number] No Take 1 tablet (6.25 mg total) by mouth 2 (two) times daily with a meal. Furth, Cadence H, PA-C 05/11/2023 Active Self  cephALEXin (KEFLEX) 500 MG capsule [id number] No Take 1 capsule (500 mg total) by mouth 3 (three) times daily for 7 days. Danelle Berry, PA-C 05/11/2023 Active Self  empagliflozin (JARDIANCE) 10 MG TABS tablet [id number] No Take 1 tablet (10 mg total) by mouth daily before breakfast. Danelle Berry, PA-C 05/11/2023 Active Self  ENTRESTO 24-26 MG [id number] No TAKE 1 TABLET BY MOUTH TWICE DAILY Iran Ouch, MD 05/11/2023 Active Self  finasteride (PROSCAR) 5 MG tablet [id number]  Take 1 tablet (5 mg total) by mouth daily. Tresa Moore, MD  Active   metFORMIN (GLUCOPHAGE) 500 MG tablet [id number] No Take 1 tablet (500 mg total) by mouth 2 (two) times daily with a meal. Danelle Berry, PA-C 05/11/2023 Active Self  spironolactone (ALDACTONE) 25 MG tablet [id number] No TAKE 1 TABLET(25 MG) BY MOUTH DAILY Iran Ouch, MD 05/11/2023 Active Self  tamsulosin (FLOMAX) 0.4 MG CAPS capsule [id number] No TAKE 1 CAPSULE(0.4 MG) BY MOUTH DAILY Sondra Come, MD 05/11/2023 Active Self            Home Care and Equipment/Supplies: Were Home Health Services Ordered?: NA Any new equipment or medical supplies ordered?: NA  Functional Questionnaire: Do you need assistance with bathing/showering or dressing?: No Do you need assistance with meal preparation?: No Do you need assistance with eating?: No Do you have difficulty  maintaining continence: No Do you need assistance with getting out of bed/getting out of a chair/moving?: No Do you have difficulty managing or taking your medications?: No  Follow up appointments  reviewed: PCP Follow-up appointment confirmed?: Yes Date of PCP follow-up appointment?: 05/24/23 Follow-up Provider: [facility] Follow-up appointment confirmed?: Yes Date of Specialist follow-up appointment?: 05/23/23 Follow-Up Specialty Provider:: uro Do you need transportation to your follow-up appointment?: No Do you understand care options if your condition(s) worsen?: Yes-patient verbalized understanding    SIGNATURE Karena Addison, LPN [facility] Nurse Health Advisor Direct Dial [phone]

## 2023-05-22 ENCOUNTER — Encounter: Payer: Self-pay | Admitting: Physician Assistant

## 2023-05-22 ENCOUNTER — Ambulatory Visit: Payer: 59 | Admitting: Physician Assistant

## 2023-05-22 VITALS — BP 114/70 | HR 94 | Temp 96.8°F | Resp 16 | Ht 72.0 in | Wt 206.6 lb

## 2023-05-22 DIAGNOSIS — Z09 Encounter for follow-up examination after completed treatment for conditions other than malignant neoplasm: Secondary | ICD-10-CM

## 2023-05-22 DIAGNOSIS — Z9889 Other specified postprocedural states: Secondary | ICD-10-CM

## 2023-05-22 DIAGNOSIS — M5106 Intervertebral disc disorders with myelopathy, lumbar region: Secondary | ICD-10-CM

## 2023-05-22 DIAGNOSIS — R310 Gross hematuria: Secondary | ICD-10-CM | POA: Diagnosis not present

## 2023-05-22 DIAGNOSIS — R29898 Other symptoms and signs involving the musculoskeletal system: Secondary | ICD-10-CM

## 2023-05-22 DIAGNOSIS — R300 Dysuria: Secondary | ICD-10-CM | POA: Diagnosis not present

## 2023-05-22 DIAGNOSIS — Z7409 Other reduced mobility: Secondary | ICD-10-CM

## 2023-05-22 NOTE — Patient Instructions (Addendum)
Please try to wear compression stockings. Use your shoe size and then measure around the widest part of your calf to get the appropriate size stocking I typically recommend 15-25 mmHg compression force for daily wear. Please put these on first thing in the morning and wear until the evening time.  These should not be painful or cut into your legs but they may be pretty snug. If they cause pain, please take them off.   Please start taking your normal medications as previously instructed  Please restart your diabetes medications at this time   I have placed a referral to Physical therapy to assist with your walking Someone should call you in the 7-10 days to help set up your apt. If you have not heard anything in the next 2 weeks please call our office

## 2023-05-22 NOTE — Progress Notes (Unsigned)
Acute Office Visit   Patient: Lawrence Holmes   DOB: [birth year]   67 y.o. Male  MRN: [id number] Visit Date: 05/22/2023  Today's healthcare provider: Oswaldo Conroy Curry Seefeldt, PA-C  Introduced myself to the patient as a Secondary school teacher and provided education on APPs in clinical practice.    Chief Complaint  Patient presents with   Hospitalization Follow-up    Pt declines urinary sx, pt states he is much better   Subjective    HPI HPI     Hospitalization Follow-up    Additional comments: Pt declines urinary sx, pt states he is much better      Last edited by Forde Radon, CMA on 05/22/2023  3:19 PM.       Transition of Care Hospital Follow up.   He denies hematuria  He is still wearing catheter- hopes this will be removed tomorrow  He reports very mild bilateral flank pains but states "it's not enough to take pain medication"    Hospital/Facility: [facility] D/C Physician: Lolita Patella, MD D/C Date: 05/15/23  Records Requested:  Records Received:  Records Reviewed:   Diagnoses on Discharge:  Principal Problem:   Hematuria Active Problems:   Acute urinary retention   Bilateral hydronephrosis   Chronic systolic CHF (congestive heart failure) (HCC)   UTI (urinary tract infection)   CAD (coronary artery disease)   BPH (benign prostatic hyperplasia)   HTN (hypertension)   HLD (hyperlipidemia)   Type II diabetes mellitus with renal manifestations (HCC)   Chronic kidney disease, stage 3a (HCC)   Tobacco use  Date of interactive Contact within 48 hours of discharge: 05/16/23 Contact was through: phone  Date of 7 day or 14 day face-to-face visit:    within 7 days  Outpatient Encounter Medications as of 05/22/2023  Medication Sig   ACCU-CHEK GUIDE test strip SMARTSIG:Via Meter 1-4 Times Daily   Accu-Chek Softclix Lancets lancets SMARTSIG:Via Meter 1-4 Times Daily   aspirin 81 MG chewable tablet Chew 1 tablet (81 mg total) by mouth daily.   atorvastatin (LIPITOR) 80  MG tablet Take 1 tablet (80 mg total) by mouth at bedtime. Overdue follow up appointment.  PLEASE CALL OFFICE TO SCHEDULE APPOINTMENT PRIOR TO NEXT REFILL   Blood Gluc Meter Disp-Strips (BLOOD GLUCOSE METER DISPOSABLE) DEVI Check fingerstick blood sugars once a day; E11.69, LON 99 months   blood glucose meter kit and supplies KIT Dispense based on patient and insurance preference. Use up to four times daily as directed. (FOR ICD-9 250.00, 250.01).   carvedilol (COREG) 6.25 MG tablet Take 1 tablet (6.25 mg total) by mouth 2 (two) times daily with a meal.   empagliflozin (JARDIANCE) 10 MG TABS tablet Take 1 tablet (10 mg total) by mouth daily before breakfast.   ENTRESTO 24-26 MG TAKE 1 TABLET BY MOUTH TWICE DAILY   finasteride (PROSCAR) 5 MG tablet Take 1 tablet (5 mg total) by mouth daily.   metFORMIN (GLUCOPHAGE) 500 MG tablet Take 1 tablet (500 mg total) by mouth 2 (two) times daily with a meal.   spironolactone (ALDACTONE) 25 MG tablet TAKE 1 TABLET(25 MG) BY MOUTH DAILY   tamsulosin (FLOMAX) 0.4 MG CAPS capsule TAKE 1 CAPSULE(0.4 MG) BY MOUTH DAILY   No facility-administered encounter medications on file as of 05/22/2023.    Diagnostic Tests Reviewed/Disposition: Cystoscopy notes reviewed, blood work reviewed,   CLINICAL DATA:  Bilateral flank pain and hematuria for 1 week. Dysuria.   EXAM: CT ABDOMEN AND  PELVIS WITHOUT CONTRAST   TECHNIQUE: Multidetector CT imaging of the abdomen and pelvis was performed following the standard protocol without IV contrast.   RADIATION DOSE REDUCTION: This exam was performed according to the departmental dose-optimization program which includes automated exposure control, adjustment of the mA and/or kV according to patient size and/or use of iterative reconstruction technique.   COMPARISON:  05/07/2022   FINDINGS: Lower chest: No acute findings.   Hepatobiliary: No mass visualized on this unenhanced exam. Gallbladder is unremarkable. No  evidence of biliary ductal dilatation.   Pancreas: No mass or inflammatory process visualized on this unenhanced exam.   Spleen:  Within normal limits in size.   Adrenals/Urinary tract: No evidence of urolithiasis. The urinary bladder is distended and contains a large amount of high attenuation material consistent with blood clot. Mild bilateral pelvicaliectasis and ureterectasis is seen to the level of the bladder, likely due to the distended state of the bladder.   Stomach/Bowel: No evidence of obstruction, inflammatory process, or abnormal fluid collections.   Vascular/Lymphatic: No pathologically enlarged lymph nodes identified. No evidence of abdominal aortic aneurysm.   Reproductive:  Mild-to-moderately enlarged prostate gland.   Other:  None.   Musculoskeletal:  No suspicious bone lesions identified.   IMPRESSION: Distended urinary bladder, which contains a large amount of blood clot.   Mild bilateral hydronephrosis and ureterectasis, likely due to the distended state of the bladder.   No evidence of urolithiasis.   Mild-to-moderately enlarged prostate.     Electronically Signed   By: Danae Orleans M.D.   On: 05/12/2023 16:13  Consults: Urology   Discharge Instructions: Continue Keflex - last dose 05/17/23   Disease/illness Education:   Home Health/Community Services Discussions/Referrals: Patient to follow up with urology outpatient (has apt tomorrow)   Establishment or re-establishment of referral orders for community resources: none   Discussion with other health care providers: none   Assessment and Support of treatment regimen adherence: completed keflex regimen yesterday  He is taking Flomax and finasteride as directed   Appointments Coordinated with: Patient to see Urology tomorrow for follow up   Education for self-management, independent living, and ADLs: reviewed slow return to normal activity and heart healthy diet  He denies difficulty with  home or self care matters at this time       Medications: Outpatient Medications Prior to Visit  Medication Sig   ACCU-CHEK GUIDE test strip SMARTSIG:Via Meter 1-4 Times Daily   Accu-Chek Softclix Lancets lancets SMARTSIG:Via Meter 1-4 Times Daily   aspirin 81 MG chewable tablet Chew 1 tablet (81 mg total) by mouth daily.   atorvastatin (LIPITOR) 80 MG tablet Take 1 tablet (80 mg total) by mouth at bedtime. Overdue follow up appointment.  PLEASE CALL OFFICE TO SCHEDULE APPOINTMENT PRIOR TO NEXT REFILL   Blood Gluc Meter Disp-Strips (BLOOD GLUCOSE METER DISPOSABLE) DEVI Check fingerstick blood sugars once a day; E11.69, LON 99 months   blood glucose meter kit and supplies KIT Dispense based on patient and insurance preference. Use up to four times daily as directed. (FOR ICD-9 250.00, 250.01).   carvedilol (COREG) 6.25 MG tablet Take 1 tablet (6.25 mg total) by mouth 2 (two) times daily with a meal.   empagliflozin (JARDIANCE) 10 MG TABS tablet Take 1 tablet (10 mg total) by mouth daily before breakfast.   ENTRESTO 24-26 MG TAKE 1 TABLET BY MOUTH TWICE DAILY   finasteride (PROSCAR) 5 MG tablet Take 1 tablet (5 mg total) by mouth daily.   metFORMIN (  GLUCOPHAGE) 500 MG tablet Take 1 tablet (500 mg total) by mouth 2 (two) times daily with a meal.   spironolactone (ALDACTONE) 25 MG tablet TAKE 1 TABLET(25 MG) BY MOUTH DAILY   tamsulosin (FLOMAX) 0.4 MG CAPS capsule TAKE 1 CAPSULE(0.4 MG) BY MOUTH DAILY   No facility-administered medications prior to visit.    Review of Systems  Constitutional:  Negative for chills and fever.  Gastrointestinal:  Negative for diarrhea, nausea and vomiting.  Genitourinary:  Positive for flank pain. Negative for difficulty urinating, dysuria and hematuria.    {Insert previous labs (optional):23779} {See past labs  Heme  Chem  Endocrine  Serology  Results Review (optional):1}   Objective    BP 114/70   Pulse 94   Temp (!) 96.8 F (36 C) (Temporal)    Resp 16   Ht 6' (1.829 m)   Wt 206 lb 9.6 oz (93.7 kg)   SpO2 96%   BMI 28.02 kg/m  {Insert last BP/Wt (optional):23777}{See vitals history (optional):1}   Physical Exam Vitals reviewed.  Constitutional:      General: He is awake.     Appearance: Normal appearance. He is well-developed and well-groomed.  HENT:     Head: Normocephalic and atraumatic.  Pulmonary:     Effort: Pulmonary effort is normal.  Neurological:     Mental Status: He is alert.  Psychiatric:        Behavior: Behavior is cooperative.       No results found for any visits on 05/22/23.  Assessment & Plan      No follow-ups on file.

## 2023-05-23 ENCOUNTER — Other Ambulatory Visit: Payer: Self-pay | Admitting: Urology

## 2023-05-23 ENCOUNTER — Telehealth: Payer: Self-pay

## 2023-05-23 ENCOUNTER — Ambulatory Visit: Payer: 59 | Admitting: Urology

## 2023-05-23 ENCOUNTER — Encounter: Payer: Self-pay | Admitting: Urology

## 2023-05-23 ENCOUNTER — Other Ambulatory Visit: Payer: Self-pay

## 2023-05-23 VITALS — BP 127/69 | HR 101 | Ht 72.0 in | Wt 206.0 lb

## 2023-05-23 DIAGNOSIS — N401 Enlarged prostate with lower urinary tract symptoms: Secondary | ICD-10-CM

## 2023-05-23 DIAGNOSIS — R339 Retention of urine, unspecified: Secondary | ICD-10-CM | POA: Diagnosis not present

## 2023-05-23 DIAGNOSIS — R310 Gross hematuria: Secondary | ICD-10-CM | POA: Diagnosis not present

## 2023-05-23 LAB — CBC WITH DIFFERENTIAL/PLATELET
Absolute Monocytes: 677 {cells}/uL (ref 200–950)
Basophils Absolute: 61 {cells}/uL (ref 0–200)
Basophils Relative: 0.6 %
Eosinophils Absolute: 586 {cells}/uL — ABNORMAL HIGH (ref 15–500)
Eosinophils Relative: 5.8 %
HCT: 37.6 % — ABNORMAL LOW (ref 38.5–50.0)
Hemoglobin: 12.4 g/dL — ABNORMAL LOW (ref 13.2–17.1)
Lymphs Abs: 3575 {cells}/uL (ref 850–3900)
MCH: 30.4 pg (ref 27.0–33.0)
MCHC: 33 g/dL (ref 32.0–36.0)
MCV: 92.2 fL (ref 80.0–100.0)
MPV: 11.2 fL (ref 7.5–12.5)
Monocytes Relative: 6.7 %
Neutro Abs: 5202 {cells}/uL (ref 1500–7800)
Neutrophils Relative %: 51.5 %
Platelets: 407 10*3/uL — ABNORMAL HIGH (ref 140–400)
RBC: 4.08 10*6/uL — ABNORMAL LOW (ref 4.20–5.80)
RDW: 12.8 % (ref 11.0–15.0)
Total Lymphocyte: 35.4 %
WBC: 10.1 10*3/uL (ref 3.8–10.8)

## 2023-05-23 LAB — COMPLETE METABOLIC PANEL WITH GFR
AG Ratio: 1.3 (calc) (ref 1.0–2.5)
ALT: 49 U/L — ABNORMAL HIGH (ref 9–46)
AST: 22 U/L (ref 10–35)
Albumin: 3.7 g/dL (ref 3.6–5.1)
Alkaline phosphatase (APISO): 81 U/L (ref 35–144)
BUN: 13 mg/dL (ref 7–25)
CO2: 27 mmol/L (ref 20–32)
Calcium: 8.9 mg/dL (ref 8.6–10.3)
Chloride: 106 mmol/L (ref 98–110)
Creat: 1.07 mg/dL (ref 0.70–1.35)
Globulin: 2.8 g/dL (ref 1.9–3.7)
Glucose, Bld: 78 mg/dL (ref 65–99)
Potassium: 4.2 mmol/L (ref 3.5–5.3)
Sodium: 140 mmol/L (ref 135–146)
Total Bilirubin: 0.3 mg/dL (ref 0.2–1.2)
Total Protein: 6.5 g/dL (ref 6.1–8.1)
eGFR: 76 mL/min/{1.73_m2} (ref >=60)

## 2023-05-23 NOTE — Progress Notes (Signed)
05/23/2023 9:52 AM   [place] D [place] [birth year] [id number]  Reason for visit: Follow up BPH, urinary retention, gross hematuria  HPI: 66 year old male who I originally met in October 2023 for BPH and urinary symptoms, elevated PVRs of approximately 300 mL, and prostate measured 80 g on CT.  He deferred outlet procedures and opted for a trial of Flomax which improved his urinary symptoms and initially normalized his PVRs.  PSA at that time was 4.8, but with reassuring 23% free and PSA density of 0.06.  Using shared decision making he opted to defer prostate biopsy.  He was admitted to [facility] on 05/12/2023 with urinary retention and gross hematuria felt to be secondary to BPH and prostatic bleeding.  He ultimately went to the OR on 05/14/2023 with Dr. Apolinar Junes for cystoscopy, clot evacuation of minimal clot, and fulguration of some prostatic bleeding.  No abnormal bladder lesions were identified.  There was a large median lobe.  I personally viewed and interpreted the CT scan dated 05/12/2023 showing distended bladder with large amount of blood clot, mild bilateral hydronephrosis down to the distended bladder.  He then failed a voiding trial postoperatively(05/15/23) with PVRs of , and was set up today to discuss possible HOLEP.  We discussed the risks and benefits of HoLEP at length.  The procedure requires general anesthesia and takes 1 to 2 hours, and a holmium laser is used to enucleate the prostate and push this tissue into the bladder.  A morcellator is then used to remove this tissue, which is sent for pathology.  The vast majority(>95%) of patients are able to discharge the same day with a catheter in place for 2 to 3 days, and will follow-up in clinic for a voiding trial.  We specifically discussed the risks of bleeding, infection, retrograde ejaculation, temporary urgency and urge incontinence, very low risk of long-term incontinence, urethral stricture/bladder neck contracture, pathologic  evaluation of prostate tissue and possible detection of prostate cancer or other malignancy, and possible need for additional procedures.  We reviewed alternatives including maximal medical therapy with Flomax and finasteride with high risk of recurrent retention and gross hematuria, prostate artery embolization, or less effective outlet procedures in the setting of his significant prostate volume like UroLift, Rezum, or TURP.  Schedule HOLEP   Sondra Come, MD  [facility] [address] [place], [place] [address] [phone]

## 2023-05-23 NOTE — Progress Notes (Signed)
Surgical Physician Order Form [facility]  Dr. Legrand Rams, MD  * Scheduling expectation : Next Available  *Length of Case: 2 hours  *Clearance needed: no  *Anticoagulation Instructions: Hold all anticoagulants  *Aspirin Instructions: Hold Aspirin  *Post-op visit Date/Instructions:  1-3 day cath removal  *Diagnosis: BPH w/urinary obstruction  *Procedure:  HOLEP (40981)   Additional orders: N/A  -Admit type: OUTpatient  -Anesthesia: General  -VTE Prophylaxis Standing Order SCD's       Other:   -Standing Lab Orders Per Anesthesia    Lab other: UA&Urine Culture  -Standing Test orders EKG/Chest x-ray per Anesthesia       Test other:   - Medications:  Ancef 2gm IV  -Other orders:  N/A

## 2023-05-23 NOTE — Progress Notes (Signed)
    [facility] Posting Form  Surgery Date: Date: 06/08/2023  Surgeon: Dr. Legrand Rams, MD  Inpt ( No  )   Outpt (Yes)   Obs ( No  )   Diagnosis: N40.1, N13.8 Benign Prostatic Hyperplasia with Urinary Obstruction  -CPT: 04540  Surgery: Holmium Laser Enucleation of the Prostate  Stop Anticoagulations: Yes and also hold ASA  Cardiac/Medical/Pulmonary Clearance needed: no  *Orders entered into EPIC  Date: 05/23/23   *Case booked in [place]  Date: 05/23/23  *Notified pt of Surgery: Date: 05/23/23  PRE-OP UA & CX: yes, will obtain in clinic on 05/28/2023  *Placed into Prior Authorization Work Angela Nevin Date: 05/23/23  Assistant/laser/rep:No

## 2023-05-23 NOTE — Patient Instructions (Addendum)

## 2023-05-23 NOTE — Assessment & Plan Note (Signed)
Unsure of chronicity  Patient would like to improve mobility and ambulation ability He would like to be referred to Physical therapy at this time to assist with strengthening and gait  Referral placed today. Continue with follow up with Neurosurgery for surgical recommendations and follow up

## 2023-05-23 NOTE — Telephone Encounter (Signed)
Per Dr. Richardo Hanks, Patient is to be scheduled for Holmium Laser Enucleation of the Prostate   Mr. [place] was contacted and possible surgical dates were discussed, Friday October 18th, 2024 was agreed upon for surgery.   Patient was instructed that Dr. Richardo Hanks will require them to provide a pre-op UA & CX prior to surgery. This was ordered and scheduled drop off appointment was made for 05/28/2023.    Patient was directed to call [phone] between 1-3pm the day before surgery to find out surgical arrival time.  Instructions were given not to eat or drink from midnight on the night before surgery and have a driver for the day of surgery. On the surgery day patient was instructed to enter through the [facility] entrance of [facility] report the Same Day Surgery desk.   Pre-Admit Testing will be in contact via phone to set up an interview with the anesthesia team to review your history and medications prior to surgery.   Reminder of this information was sent via MyChart to the patient.

## 2023-05-24 ENCOUNTER — Inpatient Hospital Stay: Payer: 59 | Admitting: Family Medicine

## 2023-05-24 NOTE — Progress Notes (Signed)
Your labs are back Your electrolytes, liver and kidney function were overall normal at this time. One of your liver enzymes is still a bit elevated but not by much. We can keep an eye on this for now Your anemia is still present but appears pretty stable right now. I recommend we continue to monitor for now- this is probably still recovering from your recent procedures and hospitalization Let us know if you have further questions or concerns

## 2023-05-28 ENCOUNTER — Ambulatory Visit (INDEPENDENT_AMBULATORY_CARE_PROVIDER_SITE_OTHER): Payer: 59 | Admitting: Physician Assistant

## 2023-05-28 DIAGNOSIS — Z01818 Encounter for other preprocedural examination: Secondary | ICD-10-CM

## 2023-05-28 DIAGNOSIS — N138 Other obstructive and reflux uropathy: Secondary | ICD-10-CM

## 2023-05-28 DIAGNOSIS — N401 Enlarged prostate with lower urinary tract symptoms: Secondary | ICD-10-CM

## 2023-05-28 NOTE — Progress Notes (Signed)
Patient was seen today for Cath Urinalysis for pre op UA and Urine Culture. Patient had catheter in already, I plugged his catheter and collected a sample after he drank water for 15 minutes. Urine output was 40 ml. Leg bag was replaced. Patient tolerated well.

## 2023-05-28 NOTE — Therapy (Signed)
OUTPATIENT PHYSICAL THERAPY THORACOLUMBAR and NEURO EVALUATION   Patient Name: Lawrence Holmes MRN: [id number] DOB:1956-05-12, 67 y.o., male Today's Date: 05/29/2023  END OF SESSION:  PT End of Session - 05/29/23 1421     Visit Number 1    Number of Visits 16    Date for PT Re-Evaluation 07/24/23    PT Start Time 0230    PT Stop Time 0313    PT Time Calculation (min) 43 min    Equipment Utilized During Treatment Gait belt    Activity Tolerance Patient tolerated treatment well    Behavior During Therapy WFL for tasks assessed/performed             Past Medical History:  Diagnosis Date   Aortic atherosclerosis (HCC)    Arthritis    CHF (congestive heart failure) (HCC)    Chronic systolic heart failure (HCC) 07/23/2017   a.) TTE 07/23/2017: EF 30-35%, mild LVH, inferolateral and inferior AK, mild BAE, G1DD; b.) TTE 10/23/2017: EF 40-45%, mild LVH, basal-mid inferior and inferoseptal AK, mild MR; c.) TTE 11/19/2021: EF 45-50%, glob HK with mod to severe inferior and basal to mid septal HK, GLS -15.3%, mild MR, G1DD.   Coronary artery disease 07/21/2017   a. 07/2017 Late presenting inferior STEMI/Cath: LM nl, LAD min irregs, LCX 100d (L->L collats), RCA 100p (L->R collats), EF 25-35%-->Med Rx; b. 10/2017 ETT (DOT): Ex time 7:38, baseline antlat TWI, no acute changes.    DDD (degenerative disc disease), lumbar    Diverticulosis of colon    Diverticulosis of large intestine without diverticulitis    Enlarged prostate    Erectile dysfunction    a.) on PDE5i (sildenafil) PRN   HTN (hypertension)    Hx of bilateral cataract extraction    Hyperlipidemia    Ischemic cardiomyopathy    a.) LHC 07/21/2017: EF 25-35%; b.) TTE 07/23/2017: 30-35%; c.) TTE 10/23/2017: EF 40-45%; d.) TTE 11/19/2021: EF 45-50%   Left inguinal hernia    Left inguinal hernia 05/09/2022   ST elevation myocardial infarction (STEMI) of inferior wall (HCC) 07/21/2017   a.) LHC 07/21/2017: EF 25-35%, LVEDP 14 mmHg.  40% pLCx, 40% mLCx, 100% dLCx, 100% pRCA --> attempted to cross wire at RCA lesion, however unable. Reasonable RCA collaterals already formed --> med mgmt.   T2DM (type 2 diabetes mellitus) (HCC)    Tobacco use    Past Surgical History:  Procedure Laterality Date   CATARACT EXTRACTION Bilateral    COLONOSCOPY WITH PROPOFOL N/A 04/04/2019   Procedure: COLONOSCOPY WITH PROPOFOL;  Surgeon: Pasty Spillers, MD;  Location: [facility] ENDOSCOPY;  Service: Endoscopy;  Laterality: N/A;   CYSTOSCOPY WITH FULGERATION N/A 05/14/2023   Procedure: CYSTOSCOPY WITH CLOT EVACUATION AND FULGERATION;  Surgeon: Vanna Scotland, MD;  Location: [facility] ORS;  Service: Urology;  Laterality: N/A;   LEFT HEART CATH AND CORONARY ANGIOGRAPHY N/A 07/21/2017   Procedure: LEFT HEART CATH AND CORONARY ANGIOGRAPHY;  Surgeon: Iran Ouch, MD;  Location: [facility] INVASIVE CV LAB;  Service: Cardiovascular;  Laterality: N/A;   LUMBAR LAMINECTOMY/DECOMPRESSION MICRODISCECTOMY Bilateral 04/05/2023   Procedure: MINIMALLY INVASIVE (MIS) L2-3 LAMINECTOMY AND DISCECTOMY;  Surgeon: Loreen Freud, MD;  Location: [facility] ORS;  Service: Neurosurgery;  Laterality: Bilateral;   XI ROBOTIC ASSISTED INGUINAL HERNIA REPAIR WITH MESH Left 05/19/2022   Procedure: XI ROBOTIC ASSISTED INGUINAL HERNIA REPAIR WITH MESH;  Surgeon: Campbell Lerner, MD;  Location: [facility] ORS;  Service: General;  Laterality: Left;   Patient Active Problem List   Diagnosis Date  Noted   S/P lumbar microdiscectomy 05/16/2023   Hematuria 05/12/2023   Acute urinary retention 05/12/2023   Bilateral hydronephrosis 05/12/2023   HTN (hypertension) 05/12/2023   HLD (hyperlipidemia) 05/12/2023   Type II diabetes mellitus with renal manifestations (HCC) 05/12/2023   Chronic kidney disease, stage 3a (HCC) 05/12/2023   Chronic systolic CHF (congestive heart failure) (HCC) 05/12/2023   BPH (benign prostatic hyperplasia) 05/12/2023   UTI (urinary tract infection) 05/12/2023   CAD  (coronary artery disease) 05/12/2023   Intervertebral lumbar disc disorder with myelopathy, lumbar region 04/02/2023   Impaired functional mobility, balance, gait, and endurance 03/26/2023   Bilateral leg weakness 03/26/2023   Abnormal prostate exam 05/09/2022   Aortic atherosclerosis (HCC) 02/24/2022   Smokes with greater than 30 pack year history 11/18/2021   Coronary artery disease of native artery of native heart with stable angina pectoris (HCC) 05/19/2021   Dyslipidemia 07/30/2020   Polyp of colon    Type 2 diabetes mellitus, controlled (HCC) 02/08/2018   History of myocardial infarction 11/08/2017   Tobacco use 11/08/2017   Ischemic cardiomyopathy 11/08/2017   CHF (congestive heart failure) (HCC) 11/08/2017   Foot callus 11/08/2017   Onychomycosis of multiple toenails with type 2 diabetes mellitus (HCC) 11/08/2017   Cataract, nuclear sclerotic, left eye 04/04/2016    PCP: Danelle Berry, PA-C  REFERRING PROVIDER: Oswaldo Conroy Mecum, PA-C  REFERRING DIAG:  M51.06 (ICD-10-CM) - Intervertebral lumbar disc disorder with myelopathy, lumbar region  Z74.09 (ICD-10-CM) - Impaired functional mobility, balance, gait, and endurance  R29.898 (ICD-10-CM) - Bilateral leg weakness  Z98.890 (ICD-10-CM) - S/P lumbar microdiscectomy    Rationale for Evaluation and Treatment: Rehabilitation  THERAPY DIAG:  Muscle weakness (generalized)  Difficulty in walking, not elsewhere classified  Unsteadiness on feet  ONSET DATE: 03/2023  SUBJECTIVE:                                                                                                                                                                                           SUBJECTIVE STATEMENT: Patient is a 67 year old male presenting to physical therapy post L2-L3 laminectomy. He began experiencing back pain in August 2024 and had surgery on 04/05/23. Prior to surgery, he was independent and living alone in a one story home with three steps to  enter. Since surgery, he has been using a Lawrence Holmes for community ambulation and a single point cane for short distances. In the shower, he has grab bars to assist with getting in and out. 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    PERTINENT HISTORY:  Approximately 6 weeks status post Right L2-L3 Laminectomy and microdiscectomy (04/05/23).  He states that he has graduated from his wheelchair and is almost only using his Lawrence Holmes, trying to graduate to his cane.  Patient has a past medical history of arthritis, congestive heart failure, CID, CDD, enlarged prostate, HTN, HLD, left inguinal hernia, type 2 diabetes. 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    PAIN:  Are you having pain? No  Current: 0/10, At worst: 3/10  Pt denies reports of radiating pain, localizes to one area    PRECAUTIONS: Back   He can increase his weight to 25 pounds  RED FLAGS: None   WEIGHT BEARING RESTRICTIONS:  No lifting more than 25 pounds   FALLS:  Has patient fallen in last 6 months? No  LIVING ENVIRONMENT: Lives with: lives alone Lives in: House/apartment Stairs: Yes: External: 3 steps; on left going up Has following equipment at home: Single point cane and Lawrence Holmes - 2 wheeled, grab bars in shower   OCCUPATION: Engineer, drilling   PLOF: Independent  PATIENT GOALS: "Get back to walking without a Lawrence Holmes or cane"   NEXT MD VISIT: 06/08/23 : prostate procedure   OBJECTIVE:  Note: Objective measures were completed at Evaluation unless otherwise noted.  DIAGNOSTIC FINDINGS:  IMPRESSION from MRI 03/26/23 1. Severe spinal canal narrowing at L2-L3 secondary to a combination of ligamentum flavum hypertrophy, short pedicles, and a disc bulge. 2. Severe left and moderate right neural foraminal narrowing at L5-S1.   PATIENT SURVEYS:  FOTO 05/29/23: 38  Typical result :  51  SCREENING FOR RED FLAGS: Bowel or bladder incontinence: No, Yes for bladder due to prostate Spinal tumors: No Cauda equina syndrome: No Compression fracture: No Abdominal aneurysm: No  COGNITION: Overall cognitive status: Within functional limits for tasks assessed     SENSATION: Light touch: WFL Coordination intact   MUSCLE LENGTH: Hamstrings: Right WNL deg; Left 56 deg   POSTURE: rounded shoulders and increased thoracic kyphosis  PALPATION: Tightness of bilateral lumbar paraspinals and quadratus lumborum  Incision neat, healing well.   LUMBAR ROM:   Not tested due to precautions  AROM eval  Flexion   Extension   Right lateral flexion   Left lateral flexion   Right rotation   Left rotation    (Blank rows = not tested)   LOWER EXTREMITY MMT:    MMT Right eval Left eval  Hip flexion 4-/5 4-/5  Hip extension    Hip abduction 4 4  Hip adduction 4- 4  Hip internal rotation    Hip external rotation    Knee flexion 4-/5 4-/5  Knee extension 4+ 4+  Ankle dorsiflexion 4/5 4/5  Ankle plantarflexion 4-/5 4-/5  Ankle inversion    Ankle eversion     (Blank rows = not tested)  LUMBAR SPECIAL TESTS:  Deferred due to precautions Hip special tests: FABER and Scour negative SLR : negative   FUNCTIONAL TESTS:  5 times sit to stand: 16.59s intermittent hand use 10 meter walk test: 20.97  .64m/s with Destyni Hoppel   GAIT: Distance walked: 72ft  Assistive device utilized: Environmental consultant - 2 wheeled Level of assistance: CGA Comments: External rotation at the hips, kyphotic posture, poor foot clearance bilateral with L> R. Decreased heel strike with foot flat noted bilaterally  TODAY'S TREATMENT:  DATE: 05/29/23    Evaluation components above performed in today's session   PATIENT EDUCATION:  Education details: HEP, POC, goals  Person educated:  Patient Education method: Explanation, Demonstration, and Handouts Education comprehension: verbalized understanding and returned demonstration  HOME EXERCISE PROGRAM: Access Code: [email or web address] URL: [email or web address] Date: 05/29/2023 Prepared by: Precious Bard  Exercises - Standing March with Counter Support  - 1 x daily - 7 x weekly - 2 sets - 10 reps - 5 hold - Heel Raises with Counter Support  - 1 x daily - 7 x weekly - 2 sets - 10 reps - 5 hold - Sit to Stand Without Arm Support  - 1 x daily - 7 x weekly - 2 sets - 10 reps - 5 hold  ASSESSMENT:  CLINICAL IMPRESSION: Patient is a 67 y.o. male who was seen today for physical therapy evaluation and treatment for low back pain, lower extremity weakness,and balance deficits. Patient is still limited by his precautions post surgery and will be further testing will be performed after these precautions are liberated. He presents with decreased lower extremity strength evidenced by his Five Times Sit to Stand score. His score indicates he is a limited community ambulator and at risk for falls. He is able to perform transfers with modified independence demonstrating good functional mobility. He ambulates with external rotation at the hips, decreased heel strike, and decreased hip flexion impacting his balance throughout gait. Patient was provided education on a home exercise program including the frequency, proper technique, and importance. Patient will benefit from skilled physical therapy to increase quality of life and progress towards prior level of function.   OBJECTIVE IMPAIRMENTS: Abnormal gait, decreased activity tolerance, decreased balance, decreased endurance, decreased mobility, difficulty walking, decreased strength, impaired flexibility, and improper body mechanics.   ACTIVITY LIMITATIONS: carrying, lifting, bending, standing, squatting, stairs, transfers, bed mobility, dressing, and locomotion level  PARTICIPATION  LIMITATIONS: meal prep, cleaning, laundry, driving, shopping, occupation, and yard work  PERSONAL FACTORS: arthritis, congestive heart failure, CID, CDD, enlarged prostate, HTN, HLD, left inguinal hernia, type 2 diabetes are also affecting patient's functional outcome.   REHAB POTENTIAL: Good  CLINICAL DECISION MAKING: Evolving/moderate complexity  EVALUATION COMPLEXITY: Moderate   GOALS: Goals reviewed with patient? Yes  SHORT TERM GOALS: Target date: 06/26/2023  Patient will be independent in home exercise program to improve strength/mobility for better functional independence with ADLs. Baseline: 05/29/23: HEP given  Goal status: INITIAL   LONG TERM GOALS: Target date: 07/24/23  Patient will increase 10 meter walk test to >1.46m/s as to improve gait speed for better community ambulation and to reduce fall risk. Baseline: .47 m/s Goal status: INITIAL  2.  Patient will increase FOTO score to equal to or greater than 51 to demonstrate statistically significant improvement in mobility and quality of life. Baseline: 05/29/23: 38 Goal status: INITIAL  3.  Patient (> 19 years old) will complete five times sit to stand test in < 15 seconds indicating an increased LE strength and improved balance. Baseline: 16.59 Goal status: INITIAL  4. Patient will ambulate 150 ft  without the use of an assistive device in order to increase independence and improve quality of life.  Baseline: Not tested  Goal status: INITIAL  5.  Patient will increase Berg Balance score by > 6 points to demonstrate decreased fall risk during functional activities. Baseline: awaiting testing until precautions are lifted  Goal status: INITIAL     PLAN:  PT FREQUENCY: 1-2x/week  PT DURATION: 8 weeks  PLANNED INTERVENTIONS: Therapeutic exercises, Therapeutic activity, Neuromuscular re-education, Balance training, Gait training, Patient/Family education, Self Care, Joint mobilization, Stair training,  Vestibular training, Canalith repositioning, DME instructions, Dry Needling, Electrical stimulation, Cryotherapy, Moist heat, scar mobilization, Taping, Manual therapy, and Re-evaluation.  PLAN FOR NEXT SESSION: balance, strengthening, endurance,    Precious Bard, PT 05/29/2023, 4:40 PM    This entire session was performed under direct supervision and direction of a licensed therapist/therapist assistant . I have personally read, edited and approve of the note as written.  Precious Bard, PT, DPT Physical Therapist - [facility]  Outpatient Physical Therapy- Main Campus [phone]

## 2023-05-29 ENCOUNTER — Telehealth: Payer: Self-pay

## 2023-05-29 ENCOUNTER — Ambulatory Visit: Payer: 59 | Attending: Physician Assistant

## 2023-05-29 DIAGNOSIS — Z7409 Other reduced mobility: Secondary | ICD-10-CM | POA: Diagnosis not present

## 2023-05-29 DIAGNOSIS — R29898 Other symptoms and signs involving the musculoskeletal system: Secondary | ICD-10-CM | POA: Insufficient documentation

## 2023-05-29 DIAGNOSIS — R262 Difficulty in walking, not elsewhere classified: Secondary | ICD-10-CM | POA: Diagnosis present

## 2023-05-29 DIAGNOSIS — M6281 Muscle weakness (generalized): Secondary | ICD-10-CM | POA: Insufficient documentation

## 2023-05-29 DIAGNOSIS — M5106 Intervertebral disc disorders with myelopathy, lumbar region: Secondary | ICD-10-CM | POA: Insufficient documentation

## 2023-05-29 DIAGNOSIS — Z9889 Other specified postprocedural states: Secondary | ICD-10-CM | POA: Insufficient documentation

## 2023-05-29 DIAGNOSIS — R2681 Unsteadiness on feet: Secondary | ICD-10-CM | POA: Insufficient documentation

## 2023-05-29 DIAGNOSIS — R278 Other lack of coordination: Secondary | ICD-10-CM | POA: Diagnosis present

## 2023-05-29 LAB — URINALYSIS, COMPLETE
Bilirubin, UA: NEGATIVE
Ketones, UA: NEGATIVE
Nitrite, UA: POSITIVE — AB
Specific Gravity, UA: 1.015 (ref 1.005–1.030)
Urobilinogen, Ur: 0.2 mg/dL (ref 0.2–1.0)
pH, UA: 6 (ref 5.0–7.5)

## 2023-05-29 LAB — MICROSCOPIC EXAMINATION
RBC, Urine: >30 /[HPF] — AB (ref 0–2)
WBC, UA: >30 /[HPF] — AB (ref 0–5)

## 2023-05-29 MED ORDER — SULFAMETHOXAZOLE-TRIMETHOPRIM 800-160 MG PO TABS: 1.0000 | ORAL_TABLET | Freq: Two times a day (BID) | ORAL | 0 refills | Status: DC

## 2023-05-29 NOTE — Telephone Encounter (Signed)
Spoke with pt. Pt. Advised of results and verbalized understanding. I have sent medication in to pharmacy and patient will start today.

## 2023-05-29 NOTE — Telephone Encounter (Signed)
-----   Message from Sondra Come sent at 05/29/2023  9:26 AM EDT ----- Please start Bactrim DS twice daily x 5 days to sterilize the urine prior to HOLEP, will call if need to change antibiotics based on culture results  Legrand Rams, MD 05/29/2023

## 2023-05-31 ENCOUNTER — Ambulatory Visit: Payer: 59

## 2023-05-31 ENCOUNTER — Telehealth: Payer: Self-pay | Admitting: *Deleted

## 2023-05-31 DIAGNOSIS — M6281 Muscle weakness (generalized): Secondary | ICD-10-CM

## 2023-05-31 DIAGNOSIS — R262 Difficulty in walking, not elsewhere classified: Secondary | ICD-10-CM

## 2023-05-31 DIAGNOSIS — R2681 Unsteadiness on feet: Secondary | ICD-10-CM

## 2023-05-31 LAB — CULTURE, URINE COMPREHENSIVE

## 2023-05-31 NOTE — Telephone Encounter (Signed)
-----   Message from Verlee Monte sent at 05/30/2023 10:27 PM EDT ----- Regarding: Request for pre-operative cardiac clearance Request for pre-operative cardiac clearance:  1. What type of surgery is being performed?  HOLEP-LASER ENUCLEATION OF THE PROSTATE WITH MORCELLATION  2. When is this surgery scheduled?  06/08/2023  3. Type of clearance being requested (medical, pharmacy, both)? MEDICAL   4. Are there any medications that need to be held prior to surgery? ASA  5. Practice name and name of physician performing surgery?  Performing surgeon: Dr. Legrand Rams, MD Requesting clearance: Quentin Mulling, FNP-C    6. Anesthesia type (none, local, MAC, general)? GENERAL  7. What is the office phone and fax number?   Phone: [phone] Fax: [phone]  ATTENTION: Unable to create telephone message as per your standard workflow. Directed by [facility] providers to send requests for cardiac clearance to this pool for appropriate distribution to provider covering pre-operative clearances.   Quentin Mulling, MSN, APRN, FNP-C, CEN [facility]  Peri-operative Services Nurse Practitioner Phone: [phone] 05/30/23 10:27 PM

## 2023-05-31 NOTE — Telephone Encounter (Signed)
S/w the pt and we have moved appt up sooner due to needing pre op clearance for procedure 06/08/23. Pt now seeing Ward Givens, NP. I will update all parties involved.

## 2023-05-31 NOTE — Therapy (Signed)
OUTPATIENT PHYSICAL THERAPY THORACOLUMBAR and NEURO TREATMENT   Patient Name: Lawrence Holmes MRN: [id number] DOB:1956-05-26, 67 y.o., male Today's Date: 06/01/2023  END OF SESSION:  PT End of Session - 05/31/23 1457     Visit Number 2    Number of Visits 16    Date for PT Re-Evaluation 07/24/23    Progress Note Due on Visit 10    PT Start Time 1450    PT Stop Time 1529    PT Time Calculation (min) 39 min    Equipment Utilized During Treatment Gait belt    Activity Tolerance Patient tolerated treatment well    Behavior During Therapy WFL for tasks assessed/performed             Past Medical History:  Diagnosis Date   Aortic atherosclerosis (HCC)    Arthritis    CHF (congestive heart failure) (HCC)    Chronic systolic heart failure (HCC) 07/23/2017   a.) TTE 07/23/2017: EF 30-35%, mild LVH, inferolateral and inferior AK, mild BAE, G1DD; b.) TTE 10/23/2017: EF 40-45%, mild LVH, basal-mid inferior and inferoseptal AK, mild MR; c.) TTE 11/19/2021: EF 45-50%, glob HK with mod to severe inferior and basal to mid septal HK, GLS -15.3%, mild MR, G1DD.   Coronary artery disease 07/21/2017   a. 07/2017 Late presenting inferior STEMI/Cath: LM nl, LAD min irregs, LCX 100d (L->L collats), RCA 100p (L->R collats), EF 25-35%-->Med Rx; b. 10/2017 ETT (DOT): Ex time 7:38, baseline antlat TWI, no acute changes.    DDD (degenerative disc disease), lumbar    Diverticulosis of colon    Diverticulosis of large intestine without diverticulitis    Enlarged prostate    Erectile dysfunction    a.) on PDE5i (sildenafil) PRN   HTN (hypertension)    Hx of bilateral cataract extraction    Hyperlipidemia    Ischemic cardiomyopathy    a.) LHC 07/21/2017: EF 25-35%; b.) TTE 07/23/2017: 30-35%; c.) TTE 10/23/2017: EF 40-45%; d.) TTE 11/19/2021: EF 45-50%   Left inguinal hernia    Left inguinal hernia 05/09/2022   ST elevation myocardial infarction (STEMI) of inferior wall (HCC) 07/21/2017   a.) LHC  07/21/2017: EF 25-35%, LVEDP 14 mmHg. 40% pLCx, 40% mLCx, 100% dLCx, 100% pRCA --> attempted to cross wire at RCA lesion, however unable. Reasonable RCA collaterals already formed --> med mgmt.   T2DM (type 2 diabetes mellitus) (HCC)    Tobacco use    Past Surgical History:  Procedure Laterality Date   CATARACT EXTRACTION Bilateral    COLONOSCOPY WITH PROPOFOL N/A 04/04/2019   Procedure: COLONOSCOPY WITH PROPOFOL;  Surgeon: Pasty Spillers, MD;  Location: [facility];  Service: Endoscopy;  Laterality: N/A;   CYSTOSCOPY WITH FULGERATION N/A 05/14/2023   Procedure: CYSTOSCOPY WITH CLOT EVACUATION AND FULGERATION;  Surgeon: Vanna Scotland, MD;  Location: [facility];  Service: Urology;  Laterality: N/A;   LEFT HEART CATH AND CORONARY ANGIOGRAPHY N/A 07/21/2017   Procedure: LEFT HEART CATH AND CORONARY ANGIOGRAPHY;  Surgeon: Iran Ouch, MD;  Location: [facility];  Service: Cardiovascular;  Laterality: N/A;   LUMBAR LAMINECTOMY/DECOMPRESSION MICRODISCECTOMY Bilateral 04/05/2023   Procedure: MINIMALLY INVASIVE (MIS) L2-3 LAMINECTOMY AND DISCECTOMY;  Surgeon: Loreen Freud, MD;  Location: [facility];  Service: Neurosurgery;  Laterality: Bilateral;   XI ROBOTIC ASSISTED INGUINAL HERNIA REPAIR WITH MESH Left 05/19/2022   Procedure: XI ROBOTIC ASSISTED INGUINAL HERNIA REPAIR WITH MESH;  Surgeon: Campbell Lerner, MD;  Location: [facility];  Service: General;  Laterality: Left;  Patient Active Problem List   Diagnosis Date Noted   S/P lumbar microdiscectomy 05/16/2023   Hematuria 05/12/2023   Acute urinary retention 05/12/2023   Bilateral hydronephrosis 05/12/2023   HTN (hypertension) 05/12/2023   HLD (hyperlipidemia) 05/12/2023   Type II diabetes mellitus with renal manifestations (HCC) 05/12/2023   Chronic kidney disease, stage 3a (HCC) 05/12/2023   Chronic systolic CHF (congestive heart failure) (HCC) 05/12/2023   BPH (benign prostatic hyperplasia) 05/12/2023   UTI  (urinary tract infection) 05/12/2023   CAD (coronary artery disease) 05/12/2023   Intervertebral lumbar disc disorder with myelopathy, lumbar region 04/02/2023   Impaired functional mobility, balance, gait, and endurance 03/26/2023   Bilateral leg weakness 03/26/2023   Abnormal prostate exam 05/09/2022   Aortic atherosclerosis (HCC) 02/24/2022   Smokes with greater than 30 pack year history 11/18/2021   Coronary artery disease of native artery of native heart with stable angina pectoris (HCC) 05/19/2021   Dyslipidemia 07/30/2020   Polyp of colon    Type 2 diabetes mellitus, controlled (HCC) 02/08/2018   History of myocardial infarction 11/08/2017   Tobacco use 11/08/2017   Ischemic cardiomyopathy 11/08/2017   CHF (congestive heart failure) (HCC) 11/08/2017   Foot callus 11/08/2017   Onychomycosis of multiple toenails with type 2 diabetes mellitus (HCC) 11/08/2017   Cataract, nuclear sclerotic, left eye 04/04/2016    PCP: Danelle Berry, PA-C  REFERRING PROVIDER: Oswaldo Conroy Mecum, PA-C  REFERRING DIAG:  M51.06 (ICD-10-CM) - Intervertebral lumbar disc disorder with myelopathy, lumbar region  Z74.09 (ICD-10-CM) - Impaired functional mobility, balance, gait, and endurance  R29.898 (ICD-10-CM) - Bilateral leg weakness  Z98.890 (ICD-10-CM) - S/P lumbar microdiscectomy    Rationale for Evaluation and Treatment: Rehabilitation  THERAPY DIAG:  Muscle weakness (generalized)  Difficulty in walking, not elsewhere classified  Unsteadiness on feet  ONSET DATE: 03/2023  SUBJECTIVE:                                                                                                                                                                                           SUBJECTIVE STATEMENT: Patient reports back is doing okay but his legs are weak and would like to eventually walk without a walker.    PERTINENT HISTORY:  Approximately 6 weeks status post Right L2-L3 Laminectomy and  microdiscectomy (04/05/23).  He states that he has graduated from his wheelchair and is almost only using his walker, trying to graduate to his cane.  Patient has a past medical history of arthritis, congestive heart failure, CID, CDD, enlarged prostate, HTN, HLD, left inguinal hernia, type 2 diabetes. Will potentially be undergoing surgery on 06/08/23 for Holep-Laser enucleation of  the prostate with morcellation procedure.    PAIN:  Are you having pain? No  Current: 0/10, At worst: 3/10  Pt denies reports of radiating pain, localizes to one area    PRECAUTIONS: Back   He can increase his weight to 25 pounds  RED FLAGS: None   WEIGHT BEARING RESTRICTIONS:  No lifting more than 25 pounds   FALLS:  Has patient fallen in last 6 months? No  LIVING ENVIRONMENT: Lives with: lives alone Lives in: House/apartment Stairs: Yes: External: 3 steps; on left going up Has following equipment at home: Single point cane and Walker - 2 wheeled, grab bars in shower   OCCUPATION: Engineer, drilling   PLOF: Independent  PATIENT GOALS: "Get back to walking without a walker or cane"   NEXT MD VISIT: 06/08/23 : prostate procedure   OBJECTIVE:  Note: Objective measures were completed at Evaluation unless otherwise noted.  DIAGNOSTIC FINDINGS:  IMPRESSION from MRI 03/26/23 1. Severe spinal canal narrowing at L2-L3 secondary to a combination of ligamentum flavum hypertrophy, short pedicles, and a disc bulge. 2. Severe left and moderate right neural foraminal narrowing at L5-S1.   PATIENT SURVEYS:  FOTO 05/29/23: 38  Typical result : 51  SCREENING FOR RED FLAGS: Bowel or bladder incontinence: No, Yes for bladder due to prostate Spinal tumors: No Cauda equina syndrome: No Compression fracture: No Abdominal aneurysm: No  COGNITION: Overall cognitive status: Within functional limits for tasks assessed     SENSATION: Light touch: WFL Coordination intact   MUSCLE LENGTH: Hamstrings: Right  WNL deg; Left 56 deg   POSTURE: rounded shoulders and increased thoracic kyphosis  PALPATION: Tightness of bilateral lumbar paraspinals and quadratus lumborum  Incision neat, healing well.   LUMBAR ROM:   Not tested due to precautions  AROM eval  Flexion   Extension   Right lateral flexion   Left lateral flexion   Right rotation   Left rotation    (Blank rows = not tested)   LOWER EXTREMITY MMT:    MMT Right eval Left eval  Hip flexion 4-/5 4-/5  Hip extension    Hip abduction 4 4  Hip adduction 4- 4  Hip internal rotation    Hip external rotation    Knee flexion 4-/5 4-/5  Knee extension 4+ 4+  Ankle dorsiflexion 4/5 4/5  Ankle plantarflexion 4-/5 4-/5  Ankle inversion    Ankle eversion     (Blank rows = not tested)  LUMBAR SPECIAL TESTS:  Deferred due to precautions Hip special tests: FABER and Scour negative SLR : negative   FUNCTIONAL TESTS:  5 times sit to stand: 16.59s intermittent hand use 10 meter walk test: 20.97  .44m/s with walker   GAIT: Distance walked: 7ft  Assistive device utilized: Environmental consultant - 2 wheeled Level of assistance: CGA Comments: External rotation at the hips, kyphotic posture, poor foot clearance bilateral with L> R. Decreased heel strike with foot flat noted bilaterally  TODAY'S TREATMENT:  DATE: 06/01/23   THEREX:   Instruction in strengthening  for LE today:  Seated Hip March 3# AW- alt LE 2 sets of 10 reps Seated Knee ext- 3# AW alt LE - 2 sets of 10 reps Standing hip march- 3# AW alt LE - x 12 reps Standing Hip abd-3# AW alt LE x 12 reps Sit to stand without UE support     PATIENT EDUCATION:  Education details: HEP, POC, goals  Person educated: Patient Education method: Explanation, Demonstration, and Handouts Education comprehension: verbalized understanding and returned  demonstration  HOME EXERCISE PROGRAM: Access Code: [email or web address] URL: [email or web address] Date: 05/29/2023 Prepared by: Precious Bard  Exercises - Standing March with Counter Support  - 1 x daily - 7 x weekly - 2 sets - 10 reps - 5 hold - Heel Raises with Counter Support  - 1 x daily - 7 x weekly - 2 sets - 10 reps - 5 hold - Sit to Stand Without Arm Support  - 1 x daily - 7 x weekly - 2 sets - 10 reps - 5 hold  ASSESSMENT:  CLINICAL IMPRESSION: Patient presents with good motivation for today's second visit. He responded well to instruction with seated/standing therex for LE Strengthening. Patient was provided education on a home exercise program including the frequency, proper technique, and importance. Patient will benefit from skilled physical therapy to increase quality of life and progress towards prior level of function.   OBJECTIVE IMPAIRMENTS: Abnormal gait, decreased activity tolerance, decreased balance, decreased endurance, decreased mobility, difficulty walking, decreased strength, impaired flexibility, and improper body mechanics.   ACTIVITY LIMITATIONS: carrying, lifting, bending, standing, squatting, stairs, transfers, bed mobility, dressing, and locomotion level  PARTICIPATION LIMITATIONS: meal prep, cleaning, laundry, driving, shopping, occupation, and yard work  PERSONAL FACTORS: arthritis, congestive heart failure, CID, CDD, enlarged prostate, HTN, HLD, left inguinal hernia, type 2 diabetes are also affecting patient's functional outcome.   REHAB POTENTIAL: Good  CLINICAL DECISION MAKING: Evolving/moderate complexity  EVALUATION COMPLEXITY: Moderate   GOALS: Goals reviewed with patient? Yes  SHORT TERM GOALS: Target date: 06/26/2023  Patient will be independent in home exercise program to improve strength/mobility for better functional independence with ADLs. Baseline: 05/29/23: HEP given  Goal status: INITIAL   LONG TERM GOALS: Target date:  07/24/23  Patient will increase 10 meter walk test to >1.40m/s as to improve gait speed for better community ambulation and to reduce fall risk. Baseline: .47 m/s Goal status: INITIAL  2.  Patient will increase FOTO score to equal to or greater than 51 to demonstrate statistically significant improvement in mobility and quality of life. Baseline: 05/29/23: 38 Goal status: INITIAL  3.  Patient (> 106 years old) will complete five times sit to stand test in < 15 seconds indicating an increased LE strength and improved balance. Baseline: 16.59 Goal status: INITIAL  4. Patient will ambulate 150 ft  without the use of an assistive device in order to increase independence and improve quality of life.  Baseline: Not tested  Goal status: INITIAL  5.  Patient will increase Berg Balance score by > 6 points to demonstrate decreased fall risk during functional activities. Baseline: awaiting testing until precautions are lifted  Goal status: INITIAL     PLAN:  PT FREQUENCY: 1-2x/week  PT DURATION: 8 weeks  PLANNED INTERVENTIONS: Therapeutic exercises, Therapeutic activity, Neuromuscular re-education, Balance training, Gait training, Patient/Family education, Self Care, Joint mobilization, Stair training, Vestibular training, Canalith repositioning, DME instructions, Dry Needling, Electrical stimulation, Cryotherapy, Moist heat, scar  mobilization, Taping, Manual therapy, and Re-evaluation.  PLAN FOR NEXT SESSION: balance, strengthening, endurance,    Lenda Kelp, PT 06/01/2023, 10:17 AM    Physical Therapist - [facility]  Outpatient Physical Therapy- Main Campus [phone]

## 2023-06-01 ENCOUNTER — Encounter
Admission: RE | Admit: 2023-06-01 | Discharge: 2023-06-01 | Disposition: A | Discharge status: Home / Self Care | Payer: 59 | Source: Ambulatory Visit | Attending: Urology | Admitting: Urology

## 2023-06-01 ENCOUNTER — Other Ambulatory Visit: Payer: Self-pay

## 2023-06-01 VITALS — Ht 72.0 in | Wt 205.0 lb

## 2023-06-01 DIAGNOSIS — E119 Type 2 diabetes mellitus without complications: Secondary | ICD-10-CM

## 2023-06-01 NOTE — Patient Instructions (Addendum)
Your procedure is scheduled on: Friday June 08, 2023. Report to the Registration Desk on the 1st floor of the [facility]. To find out your arrival time, please call [phone] between 1PM - 3PM on: Thursday June 07, 2023. If your arrival time is 6:00 am, do not arrive before that time as the [facility] entrance doors do not open until 6:00 am.  REMEMBER: Instructions that are not followed completely may result in serious medical risk, up to and including death; or upon the discretion of your surgeon and anesthesiologist your surgery may need to be rescheduled.  Do not eat food after midnight the night before surgery.  No gum chewing or hard candies.   One week prior to surgery: Stop Anti-inflammatories (NSAIDS) such as Advil, Aleve, Ibuprofen, Motrin, Naproxen, Naprosyn and Aspirin based products such as Excedrin, Goody's Powder, BC Powder. Stop ANY OVER THE COUNTER supplements until after surgery. Stop metformin (GLUCOPHAGE) 500 MG 2 days prior to surgery (take last dose 06/05/23 Stop empagliflozin (JARDIANCE) 3 days prior to surgery (Take last dose 06/04/23) Stop aspirin 81 MG 3 days prior to surgery (take last dose 06/04/23) You may however, continue to take Tylenol if needed for pain up until the day of surgery.   Continue taking all of your other prescription medications up until the day before your surgery.  ON THE DAY OF SURGERY ONLY TAKE THESE MEDICATIONS WITH SIPS OF WATER:  carvedilol (COREG) 6.25 MG  finasteride (PROSCAR) 5 MG  tamsulosin (FLOMAX) 0.4 MG    No Alcohol for 24 hours before or after surgery.  No Smoking including e-cigarettes for 24 hours before surgery.  No chewable tobacco products for at least 6 hours before surgery.  No nicotine patches on the day of surgery.  Do not use any "recreational" drugs for at least a week (preferably 2 weeks) before your surgery.  Please be advised that the combination of cocaine and anesthesia may have  negative outcomes, up to and including death. If you test positive for cocaine, your surgery will be cancelled.  On the morning of surgery brush your teeth with toothpaste and water, you may rinse your mouth with mouthwash if you wish. Do not swallow any toothpaste or mouthwash.  Use CHG Soap or wipes as directed on instruction sheet.  Do not wear jewelry, make-up, hairpins, clips or nail polish.  For welded (permanent) jewelry: bracelets, anklets, waist bands, etc.  Please have this removed prior to surgery.  If it is not removed, there is a chance that hospital personnel will need to cut it off on the day of surgery.  Do not wear lotions, powders, or perfumes.   Do not shave body hair from the neck down 48 hours before surgery.  Contact lenses, hearing aids and dentures may not be worn into surgery.  Do not bring valuables to the hospital. [facility] is not responsible for any missing/lost belongings or valuables.   Notify your doctor if there is any change in your medical condition (cold, fever, infection).  Wear comfortable clothing (specific to your surgery type) to the hospital.  After surgery, you can help prevent lung complications by doing breathing exercises.  Take deep breaths and cough every 1-2 hours. Your doctor may order a device called an Incentive Spirometer to help you take deep breaths. When coughing or sneezing, hold a pillow firmly against your incision with both hands. This is called "splinting." Doing this helps protect your incision. It also decreases belly discomfort.  If you  are being admitted to the hospital overnight, leave your suitcase in the car. After surgery it may be brought to your room.  In case of increased patient census, it may be necessary for you, the patient, to continue your postoperative care in the Same Day Surgery department.  If you are being discharged the day of surgery, you will not be allowed to drive home. You will need a  responsible individual to drive you home and stay with you for 24 hours after surgery.   If you are taking public transportation, you will need to have a responsible individual with you.  Please call the Pre-admissions Testing Dept. at [phone] if you have any questions about these instructions.  Surgery Visitation Policy:  Patients having surgery or a procedure may have two visitors.  Children under the age of 58 must have an adult with them who is not the patient.  Inpatient Visitation:    Visiting hours are 7 a.m. to 8 p.m. Up to four visitors are allowed at one time in a patient room. The visitors may rotate out with other people during the day.  One visitor age 56 or older may stay with the patient overnight and must be in the room by 8 p.m.

## 2023-06-02 ENCOUNTER — Other Ambulatory Visit: Payer: Self-pay | Admitting: Cardiovascular Disease

## 2023-06-04 ENCOUNTER — Ambulatory Visit: Payer: 59

## 2023-06-04 DIAGNOSIS — R2681 Unsteadiness on feet: Secondary | ICD-10-CM

## 2023-06-04 DIAGNOSIS — R262 Difficulty in walking, not elsewhere classified: Secondary | ICD-10-CM

## 2023-06-04 DIAGNOSIS — R278 Other lack of coordination: Secondary | ICD-10-CM

## 2023-06-04 DIAGNOSIS — M6281 Muscle weakness (generalized): Secondary | ICD-10-CM | POA: Diagnosis not present

## 2023-06-04 NOTE — Therapy (Signed)
OUTPATIENT PHYSICAL THERAPY THORACOLUMBAR and NEURO TREATMENT   Patient Name: Mckinnley D [place] MRN: [id number] DOB:April 12, 1956, 67 y.o., male Today's Date: 06/04/2023  END OF SESSION:  PT End of Session - 06/04/23 1313     Visit Number 3    Number of Visits 16    Date for PT Re-Evaluation 07/24/23    Progress Note Due on Visit 10    PT Start Time 1316    PT Stop Time 1400    PT Time Calculation (min) 44 min    Equipment Utilized During Treatment Gait belt    Activity Tolerance Patient tolerated treatment well;No increased pain    Behavior During Therapy WFL for tasks assessed/performed             Past Medical History:  Diagnosis Date   Aortic atherosclerosis (HCC)    Arthritis    CHF (congestive heart failure) (HCC)    Chronic systolic heart failure (HCC) 07/23/2017   a.) TTE 07/23/2017: EF 30-35%, mild LVH, inferolateral and inferior AK, mild BAE, G1DD; b.) TTE 10/23/2017: EF 40-45%, mild LVH, basal-mid inferior and inferoseptal AK, mild MR; c.) TTE 11/19/2021: EF 45-50%, glob HK with mod to severe inferior and basal to mid septal HK, GLS -15.3%, mild MR, G1DD.   Coronary artery disease 07/21/2017   a. 07/2017 Late presenting inferior STEMI/Cath: LM nl, LAD min irregs, LCX 100d (L->L collats), RCA 100p (L->R collats), EF 25-35%-->Med Rx; b. 10/2017 ETT (DOT): Ex time 7:38, baseline antlat TWI, no acute changes.    DDD (degenerative disc disease), lumbar    Diverticulosis of colon    Diverticulosis of large intestine without diverticulitis    Enlarged prostate    Erectile dysfunction    a.) on PDE5i (sildenafil) PRN   HTN (hypertension)    Hx of bilateral cataract extraction    Hyperlipidemia    Ischemic cardiomyopathy    a.) LHC 07/21/2017: EF 25-35%; b.) TTE 07/23/2017: 30-35%; c.) TTE 10/23/2017: EF 40-45%; d.) TTE 11/19/2021: EF 45-50%   Left inguinal hernia    Left inguinal hernia 05/09/2022   ST elevation myocardial infarction (STEMI) of inferior wall (HCC)  07/21/2017   a.) LHC 07/21/2017: EF 25-35%, LVEDP 14 mmHg. 40% pLCx, 40% mLCx, 100% dLCx, 100% pRCA --> attempted to cross wire at RCA lesion, however unable. Reasonable RCA collaterals already formed --> med mgmt.   T2DM (type 2 diabetes mellitus) (HCC)    Tobacco use    Past Surgical History:  Procedure Laterality Date   CATARACT EXTRACTION Bilateral    COLONOSCOPY WITH PROPOFOL N/A 04/04/2019   Procedure: COLONOSCOPY WITH PROPOFOL;  Surgeon: Pasty Spillers, MD;  Location: [facility];  Service: Endoscopy;  Laterality: N/A;   CYSTOSCOPY WITH FULGERATION N/A 05/14/2023   Procedure: CYSTOSCOPY WITH CLOT EVACUATION AND FULGERATION;  Surgeon: Vanna Scotland, MD;  Location: [facility];  Service: Urology;  Laterality: N/A;   LEFT HEART CATH AND CORONARY ANGIOGRAPHY N/A 07/21/2017   Procedure: LEFT HEART CATH AND CORONARY ANGIOGRAPHY;  Surgeon: Iran Ouch, MD;  Location: [facility];  Service: Cardiovascular;  Laterality: N/A;   LUMBAR LAMINECTOMY/DECOMPRESSION MICRODISCECTOMY Bilateral 04/05/2023   Procedure: MINIMALLY INVASIVE (MIS) L2-3 LAMINECTOMY AND DISCECTOMY;  Surgeon: Loreen Freud, MD;  Location: [facility];  Service: Neurosurgery;  Laterality: Bilateral;   XI ROBOTIC ASSISTED INGUINAL HERNIA REPAIR WITH MESH Left 05/19/2022   Procedure: XI ROBOTIC ASSISTED INGUINAL HERNIA REPAIR WITH MESH;  Surgeon: Campbell Lerner, MD;  Location: [facility];  Service: General;  Laterality:  Left;   Patient Active Problem List   Diagnosis Date Noted   S/P lumbar microdiscectomy 05/16/2023   Hematuria 05/12/2023   Acute urinary retention 05/12/2023   Bilateral hydronephrosis 05/12/2023   HTN (hypertension) 05/12/2023   HLD (hyperlipidemia) 05/12/2023   Type II diabetes mellitus with renal manifestations (HCC) 05/12/2023   Chronic kidney disease, stage 3a (HCC) 05/12/2023   Chronic systolic CHF (congestive heart failure) (HCC) 05/12/2023   BPH (benign prostatic hyperplasia)  05/12/2023   UTI (urinary tract infection) 05/12/2023   CAD (coronary artery disease) 05/12/2023   Intervertebral lumbar disc disorder with myelopathy, lumbar region 04/02/2023   Impaired functional mobility, balance, gait, and endurance 03/26/2023   Bilateral leg weakness 03/26/2023   Abnormal prostate exam 05/09/2022   Aortic atherosclerosis (HCC) 02/24/2022   Smokes with greater than 30 pack year history 11/18/2021   Coronary artery disease of native artery of native heart with stable angina pectoris (HCC) 05/19/2021   Dyslipidemia 07/30/2020   Polyp of colon    Type 2 diabetes mellitus, controlled (HCC) 02/08/2018   History of myocardial infarction 11/08/2017   Tobacco use 11/08/2017   Ischemic cardiomyopathy 11/08/2017   CHF (congestive heart failure) (HCC) 11/08/2017   Foot callus 11/08/2017   Onychomycosis of multiple toenails with type 2 diabetes mellitus (HCC) 11/08/2017   Cataract, nuclear sclerotic, left eye 04/04/2016    PCP: Danelle Berry, PA-C  REFERRING PROVIDER: Oswaldo Conroy Mecum, PA-C  REFERRING DIAG:  M51.06 (ICD-10-CM) - Intervertebral lumbar disc disorder with myelopathy, lumbar region  Z74.09 (ICD-10-CM) - Impaired functional mobility, balance, gait, and endurance  R29.898 (ICD-10-CM) - Bilateral leg weakness  Z98.890 (ICD-10-CM) - S/P lumbar microdiscectomy    Rationale for Evaluation and Treatment: Rehabilitation  THERAPY DIAG:  Muscle weakness (generalized)  Other lack of coordination  Difficulty in walking, not elsewhere classified  Unsteadiness on feet  ONSET DATE: 03/2023  SUBJECTIVE:                                                                                                                                                                                           SUBJECTIVE STATEMENT: Back is doing OK. No stumbles/falls and no other updates. HEP going well   PERTINENT HISTORY:  Approximately 6 weeks status post Right L2-L3 Laminectomy and  microdiscectomy (04/05/23).  He states that he has graduated from his wheelchair and is almost only using his walker, trying to graduate to his cane.  Patient has a past medical history of arthritis, congestive heart failure, CID, CDD, enlarged prostate, HTN, HLD, left inguinal hernia, type 2 diabetes. Will potentially be undergoing surgery on 06/08/23 for Holep-Laser enucleation of  the prostate with morcellation procedure.    PAIN:  Are you having pain? No  Current: 0/10, At worst: 3/10  Pt denies reports of radiating pain, localizes to one area    PRECAUTIONS: Back   He can increase his weight to 25 pounds  RED FLAGS: None   WEIGHT BEARING RESTRICTIONS:  No lifting more than 25 pounds   FALLS:  Has patient fallen in last 6 months? No  LIVING ENVIRONMENT: Lives with: lives alone Lives in: House/apartment Stairs: Yes: External: 3 steps; on left going up Has following equipment at home: Single point cane and Walker - 2 wheeled, grab bars in shower   OCCUPATION: Engineer, drilling   PLOF: Independent  PATIENT GOALS: "Get back to walking without a walker or cane"   NEXT MD VISIT: 06/08/23 : prostate procedure   OBJECTIVE:  Note: Objective measures were completed at Evaluation unless otherwise noted.  DIAGNOSTIC FINDINGS:  IMPRESSION from MRI 03/26/23 1. Severe spinal canal narrowing at L2-L3 secondary to a combination of ligamentum flavum hypertrophy, short pedicles, and a disc bulge. 2. Severe left and moderate right neural foraminal narrowing at L5-S1.   PATIENT SURVEYS:  FOTO 05/29/23: 38  Typical result : 51  SCREENING FOR RED FLAGS: Bowel or bladder incontinence: No, Yes for bladder due to prostate Spinal tumors: No Cauda equina syndrome: No Compression fracture: No Abdominal aneurysm: No  COGNITION: Overall cognitive status: Within functional limits for tasks assessed     SENSATION: Light touch: WFL Coordination intact   MUSCLE LENGTH: Hamstrings: Right  WNL deg; Left 56 deg   POSTURE: rounded shoulders and increased thoracic kyphosis  PALPATION: Tightness of bilateral lumbar paraspinals and quadratus lumborum  Incision neat, healing well.   LUMBAR ROM:   Not tested due to precautions  AROM eval  Flexion   Extension   Right lateral flexion   Left lateral flexion   Right rotation   Left rotation    (Blank rows = not tested)   LOWER EXTREMITY MMT:    MMT Right eval Left eval  Hip flexion 4-/5 4-/5  Hip extension    Hip abduction 4 4  Hip adduction 4- 4  Hip internal rotation    Hip external rotation    Knee flexion 4-/5 4-/5  Knee extension 4+ 4+  Ankle dorsiflexion 4/5 4/5  Ankle plantarflexion 4-/5 4-/5  Ankle inversion    Ankle eversion     (Blank rows = not tested)  LUMBAR SPECIAL TESTS:  Deferred due to precautions Hip special tests: FABER and Scour negative SLR : negative   FUNCTIONAL TESTS:  5 times sit to stand: 16.59s intermittent hand use 10 meter walk test: 20.97  .91m/s with walker   GAIT: Distance walked: 73ft  Assistive device utilized: Environmental consultant - 2 wheeled Level of assistance: CGA Comments: External rotation at the hips, kyphotic posture, poor foot clearance bilateral with L> R. Decreased heel strike with foot flat noted bilaterally  TODAY'S TREATMENT:  DATE: 06/04/23  THEREX:   Instruction in strengthening  for LE today:  Seated Hip March 3# AW- alt LE 2 sets of 15 reps - Rates medium Seated Knee ext- 3# AW alt LE - 2 sets of 15 reps - fatiguing  Seated adductor squeeze with pball 10x with 3 sec hold/rep  Sit to stand without UE support  2x10 - rates easy use of BUE to push off chair to maintain precautions  Standing hip march- 3# AW alt LE - x 12 reps Standing Hip abd-3# AW alt LE x 12 reps Seated DF 2x20 bilat LE Seated heel raise 3x20 bilat LE  Ambulation  with SPC in clinch gym performed in 2 sets of approx 2 min bouts with rest breaks due to LE fatigue- mildly unsteady, rates medium --additional laps with RW to promote endurance 2x1-2 min - occ L foot drag   NMR: WBOS at RW 2x30 sec NBOS at RW 2x30 sec  Comments: mild ankle righting throughout/unsteadiness   PATIENT EDUCATION:  Education details: HEP, POC, goals  Person educated: Patient Education method: Explanation, Demonstration, and Handouts Education comprehension: verbalized understanding and returned demonstration  HOME EXERCISE PROGRAM: Access Code: [email or web address] URL: [email or web address] Date: 05/29/2023 Prepared by: Precious Bard  Exercises - Standing March with Counter Support  - 1 x daily - 7 x weekly - 2 sets - 10 reps - 5 hold - Heel Raises with Counter Support  - 1 x daily - 7 x weekly - 2 sets - 10 reps - 5 hold - Sit to Stand Without Arm Support  - 1 x daily - 7 x weekly - 2 sets - 10 reps - 5 hold  ASSESSMENT:  CLINICAL IMPRESSION: Continued plan of care as laid out in recent sessions with addition of standing balance interventions and gait. Pt able to tolerate progression of LE strengthening therex well with increased reps. He still fatigued quickly with gait, able to complete about two min of ambulation before requiring rest. Patient will benefit from skilled physical therapy to increase quality of life and progress towards prior level of function.   OBJECTIVE IMPAIRMENTS: Abnormal gait, decreased activity tolerance, decreased balance, decreased endurance, decreased mobility, difficulty walking, decreased strength, impaired flexibility, and improper body mechanics.   ACTIVITY LIMITATIONS: carrying, lifting, bending, standing, squatting, stairs, transfers, bed mobility, dressing, and locomotion level  PARTICIPATION LIMITATIONS: meal prep, cleaning, laundry, driving, shopping, occupation, and yard work  PERSONAL FACTORS: arthritis, congestive heart  failure, CID, CDD, enlarged prostate, HTN, HLD, left inguinal hernia, type 2 diabetes are also affecting patient's functional outcome.   REHAB POTENTIAL: Good  CLINICAL DECISION MAKING: Evolving/moderate complexity  EVALUATION COMPLEXITY: Moderate   GOALS: Goals reviewed with patient? Yes  SHORT TERM GOALS: Target date: 06/26/2023  Patient will be independent in home exercise program to improve strength/mobility for better functional independence with ADLs. Baseline: 05/29/23: HEP given  Goal status: INITIAL   LONG TERM GOALS: Target date: 07/24/23  Patient will increase 10 meter walk test to >1.35m/s as to improve gait speed for better community ambulation and to reduce fall risk. Baseline: .47 m/s Goal status: INITIAL  2.  Patient will increase FOTO score to equal to or greater than 51 to demonstrate statistically significant improvement in mobility and quality of life. Baseline: 05/29/23: 38 Goal status: INITIAL  3.  Patient (> 62 years old) will complete five times sit to stand test in < 15 seconds indicating an increased LE strength and improved balance. Baseline: 16.59 Goal status: INITIAL  4. Patient will ambulate 150 ft  without the use of an assistive device in order to increase independence and improve quality of life.  Baseline: Not tested  Goal status: INITIAL  5.  Patient will increase Berg Balance score by > 6 points to demonstrate decreased fall risk during functional activities. Baseline: awaiting testing until precautions are lifted  Goal status: INITIAL     PLAN:  PT FREQUENCY: 1-2x/week  PT DURATION: 8 weeks  PLANNED INTERVENTIONS: Therapeutic exercises, Therapeutic activity, Neuromuscular re-education, Balance training, Gait training, Patient/Family education, Self Care, Joint mobilization, Stair training, Vestibular training, Canalith repositioning, DME instructions, Dry Needling, Electrical stimulation, Cryotherapy, Moist heat, scar mobilization,  Taping, Manual therapy, and Re-evaluation.  PLAN FOR NEXT SESSION: balance, strengthening, endurance,    DERL GREGORIUS, PT 06/04/2023, 2:08 PM    Physical Therapist - [facility]  Outpatient Physical Therapy- Main Campus [phone]

## 2023-06-05 ENCOUNTER — Ambulatory Visit: Payer: 59 | Attending: Nurse Practitioner | Admitting: Nurse Practitioner

## 2023-06-05 ENCOUNTER — Encounter: Payer: Self-pay | Admitting: Nurse Practitioner

## 2023-06-05 ENCOUNTER — Ambulatory Visit: Payer: 59

## 2023-06-05 ENCOUNTER — Encounter: Payer: Self-pay | Admitting: Urology

## 2023-06-05 VITALS — BP 102/44 | HR 79 | Ht 72.0 in | Wt 198.6 lb

## 2023-06-05 DIAGNOSIS — I2510 Atherosclerotic heart disease of native coronary artery without angina pectoris: Secondary | ICD-10-CM | POA: Diagnosis not present

## 2023-06-05 DIAGNOSIS — I5022 Chronic systolic (congestive) heart failure: Secondary | ICD-10-CM

## 2023-06-05 DIAGNOSIS — I493 Ventricular premature depolarization: Secondary | ICD-10-CM

## 2023-06-05 DIAGNOSIS — I255 Ischemic cardiomyopathy: Secondary | ICD-10-CM | POA: Diagnosis not present

## 2023-06-05 DIAGNOSIS — E782 Mixed hyperlipidemia: Secondary | ICD-10-CM

## 2023-06-05 DIAGNOSIS — I10 Essential (primary) hypertension: Secondary | ICD-10-CM

## 2023-06-05 DIAGNOSIS — E119 Type 2 diabetes mellitus without complications: Secondary | ICD-10-CM

## 2023-06-05 MED ORDER — ATORVASTATIN CALCIUM 80 MG PO TABS: 80.0000 mg | ORAL_TABLET | Freq: Every day | ORAL | 3 refills | Status: DC

## 2023-06-05 NOTE — Progress Notes (Signed)
Office Visit    Patient Name: Lawrence Holmes [place] Date of Encounter: 06/05/2023  Primary Care Provider:  Danelle Berry, PA-C Primary Cardiologist:  Lorine Bears, MD  Chief Complaint    67 y.o. male with a history of late presenting STEMI/CAD in December 2018 (medically managed), ischemic cardiomyopathy/HFmrEF (45-50% April 2023), type 2 diabetes mellitus, hypertension, and hyperlipidemia, who presents for follow-up related to CAD and perioperative cardiovascular risk assessment.  Past Medical History  Subjective   Past Medical History:  Diagnosis Date   Aortic atherosclerosis (HCC)    Arthritis    BPH with urinary obstruction    Chronic systolic heart failure (HCC) 07/23/2017   a.) TTE 07/23/2017: EF 30-35%, mild LVH, inferolateral and inferior AK, mild BAE, G1DD; b.) TTE 10/23/2017: EF 40-45%, mild LVH, basal-mid inferior and inferoseptal AK, mild MR; c.) TTE 11/19/2021: EF 45-50%, glob HK with mod to severe inferior and basal to mid septal HK, GLS -15.3%, mild MR, G1DD.   Coronary artery disease 07/21/2017   a. 07/2017 Late presenting inferior STEMI/Cath: LM nl, LAD min irregs, LCX 100d (L->L collats), RCA 100p (L->R collats), EF 25-35%-->Med Rx; b. 10/2017 ETT (DOT): Ex time 7:38, baseline antlat TWI, no acute changes; c. 09/2019 ETT: Ex time 6:49. No ECG changes.   DDD (degenerative disc disease), lumbar    a.) s/p MIS L2-3 laminectomy and disectomy   Diverticulosis of colon    Erectile dysfunction    HTN (hypertension)    Hx of bilateral cataract extraction    Hyperlipidemia    Ischemic cardiomyopathy    a.) LHC 07/21/2017: EF 25-35%; b.) TTE 07/23/2017: 30-35%; c.) TTE 10/23/2017: EF 40-45%; Holmes.) TTE 11/19/2021: EF 45-50%   Left inguinal hernia    a.) s/p repair 05/19/2022   ST elevation myocardial infarction (STEMI) of inferior wall (HCC) 07/21/2017   a.) LHC 07/21/2017: EF 25-35%, LVEDP 14 mmHg. 40% pLCx, 40% mLCx, 100% dLCx, 100% pRCA --> attempted to cross wire at RCA  lesion, however unable. Reasonable RCA collaterals already formed --> med mgmt.   T2DM (type 2 diabetes mellitus) (HCC)    Tobacco use    Past Surgical History:  Procedure Laterality Date   CATARACT EXTRACTION Bilateral    COLONOSCOPY WITH PROPOFOL N/A 04/04/2019   Procedure: COLONOSCOPY WITH PROPOFOL;  Surgeon: Pasty Spillers, MD;  Location: [facility];  Service: Endoscopy;  Laterality: N/A;   CYSTOSCOPY WITH FULGERATION N/A 05/14/2023   Procedure: CYSTOSCOPY WITH CLOT EVACUATION AND FULGERATION;  Surgeon: Vanna Scotland, MD;  Location: [facility];  Service: Urology;  Laterality: N/A;   LEFT HEART CATH AND CORONARY ANGIOGRAPHY N/A 07/21/2017   Procedure: LEFT HEART CATH AND CORONARY ANGIOGRAPHY;  Surgeon: Iran Ouch, MD;  Location: [facility];  Service: Cardiovascular;  Laterality: N/A;   LUMBAR LAMINECTOMY/DECOMPRESSION MICRODISCECTOMY Bilateral 04/05/2023   Procedure: MINIMALLY INVASIVE (MIS) L2-3 LAMINECTOMY AND DISCECTOMY;  Surgeon: Loreen Freud, MD;  Location: [facility];  Service: Neurosurgery;  Laterality: Bilateral;   XI ROBOTIC ASSISTED INGUINAL HERNIA REPAIR WITH MESH Left 05/19/2022   Procedure: XI ROBOTIC ASSISTED INGUINAL HERNIA REPAIR WITH MESH;  Surgeon: Campbell Lerner, MD;  Location: [facility];  Service: General;  Laterality: Left;    Allergies  No Known Allergies    History of Present Illness      67 y.o. 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  As above, he  has a history of CAD status post late presenting inferior STEMI December 2018. Catheterization revealed a total occlusion of the distal left circumflex and proximal RCA. EF was 25 to 30% by ventriculography and subsequently 30 to 35% by echo. The culprit was felt to be the right coronary artery and the interventional team was not  able to cross this with a wire. He was subsequently medically managed. EF improved to 40 to 45% by echo in March 2019.  He had nonischemic exercise treadmill testing in March 2019 and again in February 2021.  Most recent echo in April 2023 showed further, slight improvement in EF to 45 to 50%, with mild global hypokinesis and moderate to severe inferior and basal to mid septal hypokinesis, grade 1 diastolic dysfunction, and mild MR.   Mr. [place] was last seen in cardiology clinic in May 2024, at which time he was noted to have frequent PVCs on ECG.  Recommendation was made for event monitoring though patient did not follow through.  Over the past several months, he has done well from a cardiac standpoint.  He had back surgery in August and continues to recover.  He uses a walker for ambulation and is steadily getting stronger.  More recently, he developed urinary retention and hematuria.  He underwent cystoscopy and clot evacuation in late September, and now is pending laser nucleation of the prostate.  Has been holding aspirin for the past 2 days.  From a cardiac standpoint, he denies chest pain, dyspnea, palpitations, PND, orthopnea, dizziness, syncope, edema, or early satiety.  He says he still has the event monitor that was previously ordered, at home, and he never placed it simply because he did not think he would have a window of time long enough to obtain meaningful readings.  He is willing to try after his surgery. Objective  Home Medications    Current Outpatient Medications  Medication Sig Dispense Refill   ACCU-CHEK GUIDE test strip SMARTSIG:Via Meter 1-4 Times Daily     Accu-Chek Softclix Lancets lancets SMARTSIG:Via Meter 1-4 Times Daily     aspirin 81 MG chewable tablet Chew 1 tablet (81 mg total) by mouth daily. 30 tablet 2   Blood Gluc Meter Disp-Strips (BLOOD GLUCOSE METER DISPOSABLE) DEVI Check fingerstick blood sugars once a day; E11.69, LON 99 months 100 each 1   blood glucose meter  kit and supplies KIT Dispense based on patient and insurance preference. Use up to four times daily as directed. (FOR ICD-9 250.00, 250.01). 1 each 0   carvedilol (COREG) 6.25 MG tablet Take 1 tablet (6.25 mg total) by mouth 2 (two) times daily with a meal. 180 tablet 3   empagliflozin (JARDIANCE) 10 MG TABS tablet Take 1 tablet (10 mg total) by mouth daily before breakfast. 90 tablet 1   ENTRESTO 24-26 MG TAKE 1 TABLET BY MOUTH TWICE DAILY 60 tablet 0   finasteride (PROSCAR) 5 MG tablet Take 1 tablet (5 mg total) by mouth daily. 30 tablet 0   metFORMIN (GLUCOPHAGE) 500 MG tablet Take 1 tablet (500 mg total) by mouth 2 (two) times daily with a meal. 180 tablet 3   spironolactone (ALDACTONE) 25 MG tablet TAKE 1 TABLET(25 MG) BY MOUTH DAILY 90 tablet 2   tamsulosin (FLOMAX) 0.4 MG CAPS capsule TAKE 1 CAPSULE(0.4 MG) BY MOUTH DAILY 90 capsule 3   atorvastatin (LIPITOR) 80 MG tablet Take 1 tablet (80 mg total) by mouth at bedtime. 90 tablet 3   No current facility-administered medications for this  visit.      Physical Exam    VS:  BP (!) 102/44   Pulse 79   Ht 6' (1.829 m)   Wt 198 lb 9.6 oz (90.1 kg)   SpO2 94%   BMI 26.94 kg/m  , BMI Body mass index is 26.94 kg/m.     GEN: Well nourished, well developed, in no acute distress. HEENT: normal. Neck: Supple, no JVD, carotid bruits, or masses. Cardiac: RRR, no murmurs, rubs, or gallops. No clubbing, cyanosis, edema.  Radials 2+/PT 2+ and equal bilaterally.  Respiratory:  Respirations regular and unlabored, clear to auscultation bilaterally. GI: Soft, nontender, nondistended, BS + x 4. MS: no deformity or atrophy. Skin: warm and dry, no rash. Neuro:  Strength and sensation are intact. Psych: Normal affect.  Accessory Clinical Findings    ECG personally reviewed by me today - EKG Interpretation Date/Time:  Tuesday June 05 2023 15:42:55 EDT Ventricular Rate:  79 PR Interval:  184 QRS Duration:  94 QT Interval:  386 QTC  Calculation: 442 R Axis:   8  Text Interpretation: Sinus rhythm with frequent Premature ventricular complexes Possible Lateral infarct , age undetermined Inferior infarct Confirmed by Nicolasa Ducking [phone]) on 06/05/2023 3:50:38 PM  - no acute changes.  Lab Results  Component Value Date   WBC 10.1 05/22/2023   HGB 12.4 (L) 05/22/2023   HCT 37.6 (L) 05/22/2023   MCV 92.2 05/22/2023   PLT 407 (H) 05/22/2023   Lab Results  Component Value Date   CREATININE 1.07 05/22/2023   BUN 13 05/22/2023   NA 140 05/22/2023   K 4.2 05/22/2023   CL 106 05/22/2023   CO2 27 05/22/2023   Lab Results  Component Value Date   ALT 49 (H) 05/22/2023   AST 22 05/22/2023   ALKPHOS 57 05/07/2022   BILITOT 0.3 05/22/2023   Lab Results  Component Value Date   CHOL 135 05/26/2022   HDL 24 (L) 05/26/2022   LDLCALC 84 05/26/2022   TRIG 176 (H) 05/26/2022   CHOLHDL 5.6 (H) 05/26/2022    Lab Results  Component Value Date   HGBA1C 6.7 (A) 10/03/2022       Assessment & Plan    1.  Coronary artery disease: Status post inferior STEMI in 2018, at which time he was found to have total occlusions of the left circumflex and right coronary artery.  He was ultimately medically managed after the right coronary artery could not be successfully wired.  EF at that time was 25 to 30% but he has subsequently had a fair amount of recovery-45 to 50% by echo in 2023.  He has done well without chest pain or dyspnea and notes reasonable activity tolerance.  He is on aspirin, statin, beta-blocker.  He is currently holding aspirin for the past 2 days pending prostate surgery later this week.  2.  Chronic heart failure with midrange ejection fraction/ischemic cardiomyopathy: EF previously as low as 25 to 30% following his MI in 2018 with subsequent improvement to 45 to 50% by echo in 2021.  He is euvolemic on examination today and notes to be doing well at home without dyspnea or edema.  He remains on beta-blocker,  spironolactone, Entresto, and Jardiance with plan to begin holding Jardiance tomorrow pending surgery on Friday.  He had lab work earlier this month, which showed stable renal function and electrolytes.  3.  Frequent PVCs: This was noted previously with recommendation for outpatient monitoring, which patient has yet to perform.  We discussed the importance of this today in an effort to objectify his PVC burden and understand of antiarrhythmic therapy may be necessary.  We will reorder a 3-day ZIO monitor, as the monitor that he was previously sent in has at home, does not consider lost by the company.  Continue beta-blocker therapy.  Pending monitoring, may require repeat echo and potentially EP evaluation.  4.  Preoperative cardiovascular examination: Patient is pending prostate surgery under general anesthesia in the setting of recent hematuria, urinary retention, and BPH.  As noted above, he is recovering from back surgery but has been able to participate in physical therapy without experiencing chest pain or dyspnea.  He does have a history of asymptomatic PVCs with a nonischemic stress test in February 2021 and EF of 45 to 50% by echo in April 2023.  He is low risk for cardiac implications related to planned prostate surgery and may proceed at this time without further ischemic evaluation.  As noted above, will plan to place an event monitor to quantify his PVC burden however, given the longstanding nature of his PVCs, he does not need to wait for the results of monitoring prior to proceeding to surgery.  Continue beta-blocker and statin throughout the perioperative period.  5.  Primary hypertension: Well-controlled at 102/44 on beta-blocker, spironolactone, and Entresto therapy.  6.  Hyperlipidemia: LDL of 84 last year.  He is due for repeat lipids and LFTs however is not fasting today.  We will arrange for office follow-up in 6 weeks at which time we can obtain.  Continue statin therapy.  7.  Type 2  diabetes mellitus: A1c of 6.7 earlier this year.  He is on Jardiance and metformin therapy and followed by primary care.  8.  Disposition: Follow-up event monitoring.  Follow-up in clinic in approximately 6 weeks or sooner if necessary.  Nicolasa Ducking, NP 06/05/2023, 4:43 PM

## 2023-06-05 NOTE — Patient Instructions (Signed)
Medication Instructions:  Your physician recommends that you continue on your current medications as directed. Please refer to the Current Medication list given to you today.  *If you need a refill on your cardiac medications before your next appointment, please call your pharmacy*   Lab Work: NONE If you have labs (blood work) drawn today and your tests are completely normal, you will receive your results only by: MyChart Message (if you have MyChart) OR A paper copy in the mail If you have any lab test that is abnormal or we need to change your treatment, we will call you to review the results.   Testing/Procedures: Lawrence Holmes- Long Term Monitor Instructions  Your physician has requested you wear a ZIO patch monitor for 3 days.  This is a single patch monitor. [employer] supplies one patch monitor per enrollment. Additional stickers are not available. Please do not apply patch if you will be having a Nuclear Stress Test,  Echocardiogram, Cardiac CT, MRI, or Chest Xray during the period you would be wearing the  monitor. The patch cannot be worn during these tests. You cannot remove and re-apply the  ZIO XT patch monitor.  Your ZIO patch monitor will be mailed 3 day USPS to your address on file. It may take 3-5 days  to receive your monitor after you have been enrolled.  Once you have received your monitor, please review the enclosed instructions. Your monitor  has already been registered assigning a specific monitor serial # to you.  Billing and Patient Assistance Program Information  We have supplied [employer] with any of your insurance information on file for billing purposes. [employer] offers a sliding scale Patient Assistance Program for patients that do not have  insurance, or whose insurance does not completely cover the cost of the ZIO monitor.  You must apply for the Patient Assistance Program to qualify for this discounted rate.  To apply, please call [employer] at [phone], select  option 4, select option 2, ask to apply for  Patient Assistance Program. Meredeth Ide will ask your household income, and how many people  are in your household. They will quote your out-of-pocket cost based on that information.  [employer] will also be able to set up a 80-month, interest-free payment plan if needed.  Applying the monitor   Shave hair from upper left chest.  Hold abrader disc by orange tab. Rub abrader in 40 strokes over the upper left chest as  indicated in your monitor instructions.  Clean area with 4 enclosed alcohol pads. Let dry.  Apply patch as indicated in monitor instructions. Patch will be placed under collarbone on left  side of chest with arrow pointing upward.  Rub patch adhesive wings for 2 minutes. Remove white label marked "1". Remove the white  label marked "2". Rub patch adhesive wings for 2 additional minutes.  While looking in a mirror, press and release button in center of patch. A small green light will  flash 3-4 times. This will be your only indicator that the monitor has been turned on.  Do not shower for the first 24 hours. You may shower after the first 24 hours.  Press the button if you feel a symptom. You will hear a small click. Record Date, Time and  Symptom in the Patient Logbook.  When you are ready to remove the patch, follow instructions on the last 2 pages of Patient  Logbook. Stick patch monitor onto the last page of Patient Logbook.  Place Patient Logbook in  the blue and white box. Use locking tab on box and tape box closed  securely. The blue and white box has prepaid postage on it. Please place it in the mailbox as  soon as possible. Your physician should have your test results approximately 7 days after the  monitor has been mailed back to [facility].  Call [facility] Customer Care at [phone] if you have questions regarding  your ZIO XT patch monitor. Call them immediately if you see an orange light blinking on your  monitor.   If your monitor falls off in less than 4 days, contact our Monitor department at [phone].  If your monitor becomes loose or falls off after 4 days call [employer] at [phone] for  suggestions on securing your monitor    Follow-Up: At [facility], you and your health needs are our priority.  As part of our continuing mission to provide you with exceptional heart care, we have created designated Provider Care Teams.  These Care Teams include your primary Cardiologist (physician) and Advanced Practice Providers (APPs -  Physician Assistants and Nurse Practitioners) who all work together to provide you with the care you need, when you need it.  We recommend signing up for the patient portal called "MyChart".  Sign up information is provided on this After Visit Summary.  MyChart is used to connect with patients for Virtual Visits (Telemedicine).  Patients are able to view lab/test results, encounter notes, upcoming appointments, etc.  Non-urgent messages can be sent to your provider as well.   To learn more about what you can do with MyChart, go to [email or web address].    Your next appointment:   6 week(s)  Provider:   You may see Lorine Bears, MD or one of the following Advanced Practice Providers on your designated Care Team:   Nicolasa Ducking, NP

## 2023-06-05 NOTE — Therapy (Unsigned)
OUTPATIENT PHYSICAL THERAPY THORACOLUMBAR and NEURO TREATMENT   Patient Name: Lawrence Holmes MRN: [id number] DOB:09/30/1955, 67 y.o., male Today's Date: 06/06/2023  END OF SESSION:  PT End of Session - 06/06/23 1315     Visit Number 4    Number of Visits 16    Date for PT Re-Evaluation 07/24/23    Progress Note Due on Visit 10    PT Start Time 1316    PT Stop Time 1359    PT Time Calculation (min) 43 min    Equipment Utilized During Treatment Gait belt    Activity Tolerance Patient tolerated treatment well;No increased pain    Behavior During Therapy [facility] for tasks assessed/performed              Past Medical History:  Diagnosis Date   Aortic atherosclerosis (HCC)    Arthritis    BPH with urinary obstruction    Chronic systolic heart failure (HCC) 07/23/2017   a.) TTE 07/23/2017: EF 30-35%, mild LVH, inferolateral and inferior AK, mild BAE, G1DD; b.) TTE 10/23/2017: EF 40-45%, mild LVH, basal-mid inferior and inferoseptal AK, mild MR; c.) TTE 11/19/2021: EF 45-50%, glob HK with mod to severe inferior and basal to mid septal HK, GLS -15.3%, mild MR, G1DD.   Coronary artery disease 07/21/2017   a. 07/2017 Late presenting inferior STEMI/Cath: LM nl, LAD min irregs, LCX 100d (L->L collats), RCA 100p (L->R collats), EF 25-35%-->Med Rx; b. 10/2017 ETT (DOT): Ex time 7:38, baseline antlat TWI, no acute changes; c. 09/2019 ETT: Ex time 6:49. No ECG changes.   DDD (degenerative disc disease), lumbar    a.) s/p MIS L2-3 laminectomy and disectomy   Diverticulosis of colon    Erectile dysfunction    HTN (hypertension)    Hx of bilateral cataract extraction    Hyperlipidemia    Ischemic cardiomyopathy    a.) LHC 07/21/2017: EF 25-35%; b.) TTE 07/23/2017: 30-35%; c.) TTE 10/23/2017: EF 40-45%; d.) TTE 11/19/2021: EF 45-50%   Left inguinal hernia    a.) s/p repair 05/19/2022   ST elevation myocardial infarction (STEMI) of inferior wall (HCC) 07/21/2017   a.) LHC 07/21/2017: EF 25-35%,  LVEDP 14 mmHg. 40% pLCx, 40% mLCx, 100% dLCx, 100% pRCA --> attempted to cross wire at RCA lesion, however unable. Reasonable RCA collaterals already formed --> med mgmt.   T2DM (type 2 diabetes mellitus) (HCC)    Tobacco use    Past Surgical History:  Procedure Laterality Date   CATARACT EXTRACTION Bilateral    COLONOSCOPY WITH PROPOFOL N/A 04/04/2019   Procedure: COLONOSCOPY WITH PROPOFOL;  Surgeon: Pasty Spillers, MD;  Location: [facility] ENDOSCOPY;  Service: Endoscopy;  Laterality: N/A;   CYSTOSCOPY WITH FULGERATION N/A 05/14/2023   Procedure: CYSTOSCOPY WITH CLOT EVACUATION AND FULGERATION;  Surgeon: Vanna Scotland, MD;  Location: [facility] ORS;  Service: Urology;  Laterality: N/A;   LEFT HEART CATH AND CORONARY ANGIOGRAPHY N/A 07/21/2017   Procedure: LEFT HEART CATH AND CORONARY ANGIOGRAPHY;  Surgeon: Iran Ouch, MD;  Location: [facility] INVASIVE CV LAB;  Service: Cardiovascular;  Laterality: N/A;   LUMBAR LAMINECTOMY/DECOMPRESSION MICRODISCECTOMY Bilateral 04/05/2023   Procedure: MINIMALLY INVASIVE (MIS) L2-3 LAMINECTOMY AND DISCECTOMY;  Surgeon: Loreen Freud, MD;  Location: [facility] ORS;  Service: Neurosurgery;  Laterality: Bilateral;   XI ROBOTIC ASSISTED INGUINAL HERNIA REPAIR WITH MESH Left 05/19/2022   Procedure: XI ROBOTIC ASSISTED INGUINAL HERNIA REPAIR WITH MESH;  Surgeon: Campbell Lerner, MD;  Location: [facility] ORS;  Service: General;  Laterality: Left;   Patient  Active Problem List   Diagnosis Date Noted   S/P lumbar microdiscectomy 05/16/2023   Hematuria 05/12/2023   Acute urinary retention 05/12/2023   Bilateral hydronephrosis 05/12/2023   HTN (hypertension) 05/12/2023   HLD (hyperlipidemia) 05/12/2023   Type II diabetes mellitus with renal manifestations (HCC) 05/12/2023   Chronic kidney disease, stage 3a (HCC) 05/12/2023   Chronic systolic CHF (congestive heart failure) (HCC) 05/12/2023   BPH (benign prostatic hyperplasia) 05/12/2023   UTI (urinary tract infection)  05/12/2023   CAD (coronary artery disease) 05/12/2023   Intervertebral lumbar disc disorder with myelopathy, lumbar region 04/02/2023   Impaired functional mobility, balance, gait, and endurance 03/26/2023   Bilateral leg weakness 03/26/2023   Abnormal prostate exam 05/09/2022   Aortic atherosclerosis (HCC) 02/24/2022   Smokes with greater than 30 pack year history 11/18/2021   Coronary artery disease of native artery of native heart with stable angina pectoris (HCC) 05/19/2021   Dyslipidemia 07/30/2020   Polyp of colon    Type 2 diabetes mellitus, controlled (HCC) 02/08/2018   History of myocardial infarction 11/08/2017   Tobacco use 11/08/2017   Ischemic cardiomyopathy 11/08/2017   CHF (congestive heart failure) (HCC) 11/08/2017   Foot callus 11/08/2017   Onychomycosis of multiple toenails with type 2 diabetes mellitus (HCC) 11/08/2017   Cataract, nuclear sclerotic, left eye 04/04/2016    PCP: Danelle Berry, PA-C  REFERRING PROVIDER: Oswaldo Conroy Mecum, PA-C  REFERRING DIAG:  M51.06 (ICD-10-CM) - Intervertebral lumbar disc disorder with myelopathy, lumbar region  Z74.09 (ICD-10-CM) - Impaired functional mobility, balance, gait, and endurance  R29.898 (ICD-10-CM) - Bilateral leg weakness  Z98.890 (ICD-10-CM) - S/P lumbar microdiscectomy    Rationale for Evaluation and Treatment: Rehabilitation  THERAPY DIAG:  Muscle weakness (generalized)  Difficulty in walking, not elsewhere classified  Unsteadiness on feet  ONSET DATE: 03/2023  SUBJECTIVE:                                                                                                                                                                                           SUBJECTIVE STATEMENT: Back is doing OK. No stumbles/falls and no other updates. HEP going well   PERTINENT HISTORY:  Approximately 6 weeks status post Right L2-L3 Laminectomy and microdiscectomy (04/05/23).  He states that he has graduated from his  wheelchair and is almost only using his walker, trying to graduate to his cane.  Patient has a past medical history of arthritis, congestive heart failure, CID, CDD, enlarged prostate, HTN, HLD, left inguinal hernia, type 2 diabetes. 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    PAIN:  Are you having pain? No  Current: 0/10, At worst: 3/10  Pt denies reports of radiating pain, localizes to one area    PRECAUTIONS: Back   He can increase his weight to 25 pounds  RED FLAGS: None   WEIGHT BEARING RESTRICTIONS:  No lifting more than 25 pounds   FALLS:  Has patient fallen in last 6 months? No  LIVING ENVIRONMENT: Lives with: lives alone Lives in: House/apartment Stairs: Yes: External: 3 steps; on left going up Has following equipment at home: Single point cane and Walker - 2 wheeled, grab bars in shower   OCCUPATION: Engineer, drilling   PLOF: Independent  PATIENT GOALS: "Get back to walking without a walker or cane"   NEXT MD VISIT: 06/08/23 : prostate procedure   OBJECTIVE:  Note: Objective measures were completed at Evaluation unless otherwise noted.  DIAGNOSTIC FINDINGS:  IMPRESSION from MRI 03/26/23 1. Severe spinal canal narrowing at L2-L3 secondary to a combination of ligamentum flavum hypertrophy, short pedicles, and a disc bulge. 2. Severe left and moderate right neural foraminal narrowing at L5-S1.   PATIENT SURVEYS:  FOTO 05/29/23: 38  Typical result : 51  SCREENING FOR RED FLAGS: Bowel or bladder incontinence: No, Yes for bladder due to prostate Spinal tumors: No Cauda equina syndrome: No Compression fracture: No Abdominal aneurysm: No  COGNITION: Overall cognitive status: Within functional limits for tasks assessed     SENSATION: Light touch: WFL Coordination intact   MUSCLE LENGTH: Hamstrings: Right WNL deg; Left 56 deg   POSTURE: rounded shoulders and increased  thoracic kyphosis  PALPATION: Tightness of bilateral lumbar paraspinals and quadratus lumborum  Incision neat, healing well.   LUMBAR ROM:   Not tested due to precautions  AROM eval  Flexion   Extension   Right lateral flexion   Left lateral flexion   Right rotation   Left rotation    (Blank rows = not tested)   LOWER EXTREMITY MMT:    MMT Right eval Left eval  Hip flexion 4-/5 4-/5  Hip extension    Hip abduction 4 4  Hip adduction 4- 4  Hip internal rotation    Hip external rotation    Knee flexion 4-/5 4-/5  Knee extension 4+ 4+  Ankle dorsiflexion 4/5 4/5  Ankle plantarflexion 4-/5 4-/5  Ankle inversion    Ankle eversion     (Blank rows = not tested)  LUMBAR SPECIAL TESTS:  Deferred due to precautions Hip special tests: FABER and Scour negative SLR : negative   FUNCTIONAL TESTS:  5 times sit to stand: 16.59s intermittent hand use 10 meter walk test: 20.97  .29m/s with walker   GAIT: Distance walked: 39ft  Assistive device utilized: Environmental consultant - 2 wheeled Level of assistance: CGA Comments: External rotation at the hips, kyphotic posture, poor foot clearance bilateral with L> R. Decreased heel strike with foot flat noted bilaterally  TODAY'S TREATMENT:  DATE: 06/06/23  THEREX:   Nustep level 1 x 6 min   Seated Hip March 3# AW- alt LE 3 sets of 10 reps - Rates medium Seated Knee ext- 3# AW alt LE - 2 sets of 15 reps - fatiguing on LLE particularly  Seated heel raises 3 x 15 reps with 3# AW  In // bars stepping over 1/2 foam rollers x 10 laps with 3# AW - x 10 laterally with 3 # AW Seated adductor squeeze with pball 10x with 3 sec hold/rep  Sit to stand without UE support  2x10   Ambulation with SPC in gym around obstacles to end session     PATIENT EDUCATION:  Education details: HEP, POC, goals  Person educated:  Patient Education method: Explanation, Demonstration, and Handouts Education comprehension: verbalized understanding and returned demonstration  HOME EXERCISE PROGRAM: Access Code: [email or web address] URL: [email or web address] Date: 05/29/2023 Prepared by: Precious Bard  Exercises - Standing March with Counter Support  - 1 x daily - 7 x weekly - 2 sets - 10 reps - 5 hold - Heel Raises with Counter Support  - 1 x daily - 7 x weekly - 2 sets - 10 reps - 5 hold - Sit to Stand Without Arm Support  - 1 x daily - 7 x weekly - 2 sets - 10 reps - 5 hold  ASSESSMENT:  CLINICAL IMPRESSION: Continued with current plan of care as laid out in evaluation and recent prior sessions. Pt remains motivated to advance progress toward goals in order to maximize independence and safety at home. Pt requires high level assistance and cuing for completion of exercises in order to provide adequate level of stimulation challenge while minimizing pain and discomfort when possible. Pt closely monitored throughout session pt response and to maximize patient safety during interventions. Pt continues to demonstrate progress toward goals AEB progression of interventions this date either in volume or intensity.  OBJECTIVE IMPAIRMENTS: Abnormal gait, decreased activity tolerance, decreased balance, decreased endurance, decreased mobility, difficulty walking, decreased strength, impaired flexibility, and improper body mechanics.   ACTIVITY LIMITATIONS: carrying, lifting, bending, standing, squatting, stairs, transfers, bed mobility, dressing, and locomotion level  PARTICIPATION LIMITATIONS: meal prep, cleaning, laundry, driving, shopping, occupation, and yard work  PERSONAL FACTORS: arthritis, congestive heart failure, CID, CDD, enlarged prostate, HTN, HLD, left inguinal hernia, type 2 diabetes are also affecting patient's functional outcome.   REHAB POTENTIAL: Good  CLINICAL DECISION MAKING: Evolving/moderate  complexity  EVALUATION COMPLEXITY: Moderate   GOALS: Goals reviewed with patient? Yes  SHORT TERM GOALS: Target date: 06/26/2023  Patient will be independent in home exercise program to improve strength/mobility for better functional independence with ADLs. Baseline: 05/29/23: HEP given  Goal status: INITIAL   LONG TERM GOALS: Target date: 07/24/23  Patient will increase 10 meter walk test to >1.19m/s as to improve gait speed for better community ambulation and to reduce fall risk. Baseline: .47 m/s Goal status: INITIAL  2.  Patient will increase FOTO score to equal to or greater than 51 to demonstrate statistically significant improvement in mobility and quality of life. Baseline: 05/29/23: 38 Goal status: INITIAL  3.  Patient (> 19 years old) will complete five times sit to stand test in < 15 seconds indicating an increased LE strength and improved balance. Baseline: 16.59 Goal status: INITIAL  4. Patient will ambulate 150 ft  without the use of an assistive device in order to increase independence and improve quality of life.  Baseline: Not tested  Goal status: INITIAL  5.  Patient will increase Berg Balance score by > 6 points to demonstrate decreased fall risk during functional activities. Baseline: awaiting testing until precautions are lifted  Goal status: INITIAL     PLAN:  PT FREQUENCY: 1-2x/week  PT DURATION: 8 weeks  PLANNED INTERVENTIONS: Therapeutic exercises, Therapeutic activity, Neuromuscular re-education, Balance training, Gait training, Patient/Family education, Self Care, Joint mobilization, Stair training, Vestibular training, Canalith repositioning, DME instructions, Dry Needling, Electrical stimulation, Cryotherapy, Moist heat, scar mobilization, Taping, Manual therapy, and Re-evaluation.  PLAN FOR NEXT SESSION: balance, strengthening, endurance,    Norman Herrlich, PT 06/06/2023, 1:21 PM    Physical Therapist - [facility]  Outpatient Physical Therapy- Main Campus [phone]

## 2023-06-05 NOTE — Progress Notes (Signed)
Perioperative / Anesthesia Services  Pre-Admission Testing Clinical Review / Pre-Operative Anesthesia Consult  Date: 06/06/23  Patient Demographics:  Name: Lawrence Holmes [place] DOB:   1955-10-31 MRN:   [id number]  Planned Surgical Procedure(s):    Case: [id number] Date/Time: 06/08/23 0926   Procedure: HOLEP-LASER ENUCLEATION OF THE PROSTATE WITH MORCELLATION   Anesthesia type: General   Pre-op diagnosis: Benign Prostatic Hyperplasia with Urinary Obstruction   Location: [facility] OR ROOM 10 / [facility]   Surgeons: Sondra Come, MD     NOTE: Available PAT nursing documentation and vital signs have been reviewed. Clinical nursing staff has updated patient's PMH/PSHx, current medication list, and drug allergies/intolerances to ensure comprehensive history available to assist in medical decision making as it pertains to the aforementioned surgical procedure and anticipated anesthetic course. Extensive review of available clinical information personally performed. [place] [facility] and PSHx updated with any diagnoses/procedures that  may have been inadvertently omitted during his intake with the pre-admission testing department's nursing staff.  Clinical Discussion:  Lawrence Holmes [place] is a 67 y.o. male who is submitted for pre-surgical anesthesia review and clearance prior to him undergoing the above procedure. Patient is a Current Smoker (48.8 pack years). Pertinent PMH includes: CAD, inferior STEMI, ischemic cardiomyopathy, CHF, aortic atherosclerosis, HTN, HLD, T2DM, OA, lumbar DDD (s/p L2-L3 laminectomy and discectomy), BPH.   Patient is followed by cardiology Kirke Corin, MD). He was last seen in the cardiology clinic on 06/05/2023; notes reviewed. At the time of his clinic visit, patient doing well overall from a cardiovascular perspective. Patient denied any chest pain, shortness of breath, PND, orthopnea, palpitations, significant peripheral edema, weakness, fatigue, vertiginous symptoms, or  presyncope/syncope. Patient with a past medical history significant for cardiovascular diagnoses. Documented physical exam was grossly benign, providing no evidence of acute exacerbation and/or decompensation of the patient's known cardiovascular conditions.  Patient suffered an inferior wall STEMI on 07/21/2017.  Diagnostic left heart catheterization at that time revealed a severely reduced left ventricular systolic function with an EF of 25-35%.  LVEDP elevated at 14 mmHg.  There was multivessel CAD; 40% proximal LCx, 40% mid LCx, 100% distal LCx, and 100% distal RCA.  PCI was performed with attempts to cross the wire in the RCA lesion, however attempts were unsuccessful.  Patient with reasonable RCA collaterals that had already formed.  The decision was made to defer further intervention opting for medical management.   Patient with a history of ischemic cardiomyopathy following STEMI in 07/2017.  EF at time of diagnosis was 25-35%.  Since that time, cardiac function has been monitored.  Last TTE performed on 11/19/2021 revealed a improved left ventricular systolic function with an EF of 45-50%.  There was global hypokinesis with moderate to severe inferior and basal to mid septal hypokinesis. Left ventricular diastolic Doppler parameters consistent with abnormal relaxation (G1DD). GLS -15.3%. There was mild mitral valve regurgitation.  There was no evidence of a significant transvalvular gradient to suggest stenosis.   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  Exercise time was 7 minutes and 38 seconds.  There were baseline and lateral T wave inversions with no acute changes noted during the study.  Heart failure and blood pressure well managed on currently prescribed beta-blocker (carvedilol), diuretic (spironolactone), and ARB/ANRi (Entresto) therapies; blood pressure documented at 102/44 mmHg.  Patient is on atorvastatin for his HLD diagnosis and further  ASCVD prevention.  T2DM well-controlled on currently prescribed regimen last Hgb A1c  was  6.7% when checked on 10/03/2022. In the setting of heart failure with a concurrent T2DM diagnosis, patient is on an SGLT2i (empagliflozin) for added cardiovascular and renovascular protection. Patient does not have an OSAH diagnosis.  Functional limitation limited by recent surgery on his back. Patient uses a walker for ambulation assistance and noted that he was steadily getting stronger. He is able to complete all of his ADL/IADLs without cardiovascular limitation. Per the DASI, patient is able to exceed 4 METS of physical activity without experiencing any degree of angina/anginal equivalent symptoms. No changes were made to his medication regimen. Discuss need for previously ordered Holter study to further assess his asymptomatic PVCs. Patient will plans to complete following recovery from upcoming urology surgery. Patient to follow-up with outpatient cardiology in 6 weeks or sooner if needed.   Lawrence Holmes [place] is scheduled for a HOLEP-LASER ENUCLEATION OF THE PROSTATE WITH MORCELLATION on 06/08/2023 with Dr. Legrand Rams, MD.  Given patient's past medical history significant for cardiovascular diagnoses, presurgical cardiac clearance was sought by the PAT team.  Per cardiology, "he is recovering from back surgery but has been able to participate in physical therapy without experiencing chest pain or dyspnea. He does have a history of asymptomatic PVCs with a nonischemic stress test in February 2021 and EF of 45 to 50% by echo in April 2023. He is low risk for cardiac implications related to planned prostate surgery and may proceed at this time without further ischemic evaluation. As noted above, will plan to place an event monitor to quantify his PVC burden however, given the longstanding nature of his PVCs, he does not need to wait for the results of monitoring prior to proceeding to surgery. Continue beta-blocker and  statin throughout the perioperative period".   In review of his medication reconciliation, it is noted the patient is on daily antiplatelet therapy.  He has been instructed on recommendations from his cardiologist and general surgeon regarding the need to hold his daily low-dose ASA for 3 days prior to his procedure with plans to restart her since postoperative bleeding respectively minimized by his primary attending surgeon.  The patient is aware that his last dose of ASA should be on 06/04/2023.   Patient denies previous perioperative complications with anesthesia in the past. In review of the available records, it is noted that patient underwent a general anesthetic course here at [facility] (ASA III) in 04/2023 without documented complications.      06/05/2023    3:00 PM 06/01/2023   12:01 PM 05/23/2023    9:55 AM  Vitals with BMI  Height 6\' 0"  6\' 0"  6\' 0"   Weight 198 lbs 10 oz 205 lbs 206 lbs  BMI 26.93 27.8 27.93  Systolic 102  127  Diastolic 44  69  Pulse 79  101    Providers/Specialists:   NOTE: Primary physician provider listed below. Patient may have been seen by APP or partner within same practice.   PROVIDER ROLE / SPECIALTY LAST Lise Auer, MD Urology (Surgeon) 05/23/2023  Danelle Berry, PA-C Primary Care Provider 05/22/2023  Lorine Bears, MD Cardiology  06/05/2023   Allergies:  Patient has no known allergies.  Current Home Medications:   No current facility-administered medications for this encounter.    ACCU-CHEK GUIDE test strip   Accu-Chek Softclix Lancets lancets   aspirin 81 MG chewable tablet   atorvastatin (LIPITOR) 80 MG tablet   Blood Gluc Meter Disp-Strips (BLOOD GLUCOSE METER DISPOSABLE) DEVI  blood glucose meter kit and supplies KIT   carvedilol (COREG) 6.25 MG tablet   empagliflozin (JARDIANCE) 10 MG TABS tablet   ENTRESTO 24-26 MG   finasteride (PROSCAR) 5 MG tablet   metFORMIN (GLUCOPHAGE) 500 MG  tablet   spironolactone (ALDACTONE) 25 MG tablet   tamsulosin (FLOMAX) 0.4 MG CAPS capsule   History:   Past Medical History:  Diagnosis Date   Aortic atherosclerosis (HCC)    Arthritis    BPH with urinary obstruction    Chronic systolic heart failure (HCC) 07/23/2017   a.) TTE 07/23/2017: EF 30-35%, mild LVH, inferolateral and inferior AK, mild BAE, G1DD; b.) TTE 10/23/2017: EF 40-45%, mild LVH, basal-mid inferior and inferoseptal AK, mild MR; c.) TTE 11/19/2021: EF 45-50%, glob HK with mod to severe inferior and basal to mid septal HK, GLS -15.3%, mild MR, G1DD.   Coronary artery disease 07/21/2017   a. 07/2017 Late presenting inferior STEMI/Cath: LM nl, LAD min irregs, LCX 100d (L->L collats), RCA 100p (L->R collats), EF 25-35%-->Med Rx; b. 10/2017 ETT (DOT): Ex time 7:38, baseline antlat TWI, no acute changes; c. 09/2019 ETT: Ex time 6:49. No ECG changes.   DDD (degenerative disc disease), lumbar    a.) s/p MIS L2-3 laminectomy and disectomy   Diverticulosis of colon    Erectile dysfunction    HTN (hypertension)    Hx of bilateral cataract extraction    Hyperlipidemia    Ischemic cardiomyopathy    a.) LHC 07/21/2017: EF 25-35%; b.) TTE 07/23/2017: 30-35%; c.) TTE 10/23/2017: EF 40-45%; Holmes.) TTE 11/19/2021: EF 45-50%   Left inguinal hernia    a.) s/p repair 05/19/2022   ST elevation myocardial infarction (STEMI) of inferior wall (HCC) 07/21/2017   a.) LHC 07/21/2017: EF 25-35%, LVEDP 14 mmHg. 40% pLCx, 40% mLCx, 100% dLCx, 100% pRCA --> attempted to cross wire at RCA lesion, however unable. Reasonable RCA collaterals already formed --> med mgmt.   T2DM (type 2 diabetes mellitus) (HCC)    Tobacco use    Past Surgical History:  Procedure Laterality Date   CATARACT EXTRACTION Bilateral    COLONOSCOPY WITH PROPOFOL N/A 04/04/2019   Procedure: COLONOSCOPY WITH PROPOFOL;  Surgeon: Pasty Spillers, MD;  Location: [facility] ENDOSCOPY;  Service: Endoscopy;  Laterality: N/A;   CYSTOSCOPY  WITH FULGERATION N/A 05/14/2023   Procedure: CYSTOSCOPY WITH CLOT EVACUATION AND FULGERATION;  Surgeon: Vanna Scotland, MD;  Location: [facility] ORS;  Service: Urology;  Laterality: N/A;   LEFT HEART CATH AND CORONARY ANGIOGRAPHY N/A 07/21/2017   Procedure: LEFT HEART CATH AND CORONARY ANGIOGRAPHY;  Surgeon: Iran Ouch, MD;  Location: [facility] INVASIVE CV LAB;  Service: Cardiovascular;  Laterality: N/A;   LUMBAR LAMINECTOMY/DECOMPRESSION MICRODISCECTOMY Bilateral 04/05/2023   Procedure: MINIMALLY INVASIVE (MIS) L2-3 LAMINECTOMY AND DISCECTOMY;  Surgeon: Loreen Freud, MD;  Location: [facility] ORS;  Service: Neurosurgery;  Laterality: Bilateral;   XI ROBOTIC ASSISTED INGUINAL HERNIA REPAIR WITH MESH Left 05/19/2022   Procedure: XI ROBOTIC ASSISTED INGUINAL HERNIA REPAIR WITH MESH;  Surgeon: Campbell Lerner, MD;  Location: [facility] ORS;  Service: General;  Laterality: Left;   Family History  Problem Relation Age of Onset   Heart disease Mother    Hyperlipidemia Mother    Hypertension Mother    Heart attack Sister    Social History   Tobacco Use   Smoking status: Every Day    Current packs/day: 1.00    Average packs/day: 1 pack/day for 48.8 years (48.8 ttl pk-yrs)    Types: Cigarettes  Start date: 08/21/1974    Passive exposure: Current   Smokeless tobacco: Never   Tobacco comments:    Started smoking around 67 y/o has stopped several times  Vaping Use   Vaping status: Never Used  Substance Use Topics   Alcohol use: Yes    Comment: rare   Drug use: No    Pertinent Clinical Results:  LABS:   Lab Results  Component Value Date   WBC 10.1 05/22/2023   HGB 12.4 (L) 05/22/2023   HCT 37.6 (L) 05/22/2023   MCV 92.2 05/22/2023   PLT 407 (H) 05/22/2023   Lab Results  Component Value Date   NA 140 05/22/2023   K 4.2 05/22/2023   CO2 27 05/22/2023   GLUCOSE 78 05/22/2023   BUN 13 05/22/2023   CREATININE 1.07 05/22/2023   CALCIUM 8.9 05/22/2023   EGFR 76 05/22/2023   GFRNONAA >60  05/15/2023   Component Date Value Ref Range Status   Urine Culture, Comprehensive 05/28/2023 Final report   Final   Organism ID, Bacteria 05/28/2023 Comment   Final   Comment: More than 3 organisms recovered, none predominant. Please submit another culture if clinically indicated. Greater than 100,000 colony forming units per mL    Specific Gravity, UA 05/28/2023 1.015  1.005 - 1.030 Final   pH, UA 05/28/2023 6.0  5.0 - 7.5 Final   Color, UA 05/28/2023 Red (A)  Yellow Final   Appearance Ur 05/28/2023 Hazy (A)  Clear Final   Leukocytes,UA 05/28/2023 2+ (A)  Negative Final   Protein,UA 05/28/2023 2+ (A)  Negative/Trace Final   Glucose, UA 05/28/2023 2+ (A)  Negative Final   Ketones, UA 05/28/2023 Negative  Negative Final   RBC, UA 05/28/2023 3+ (A)  Negative Final   Bilirubin, UA 05/28/2023 Negative  Negative Final   Urobilinogen, Ur 05/28/2023 0.2  0.2 - 1.0 mg/dL Final   Nitrite, UA 16/05/9603 Positive (A)  Negative Final   Microscopic Examination 05/28/2023 See below:   Final   WBC, UA 05/28/2023 >30 (A)  0 - 5 /hpf Final   RBC, Urine 05/28/2023 >30 (A)  0 - 2 /hpf Final   Epithelial Cells (non renal) 05/28/2023 0-10  0 - 10 /hpf Final   Mucus, UA 05/28/2023 Present (A)  Not Estab. Final   Bacteria, UA 05/28/2023 Many (A)  None seen/Few Final    ECG: Date: 04/02/2023 Time ECG obtained: 1656 PM Rate: 59 bpm Rhythm: sinus bradycardia with occasional PVCs Axis (leads I and aVF): Normal Intervals: PR 180 ms. QRS 96 ms. QTc 427 ms. ST segment and T wave changes: No evidence of acute ST segment elevation or depression. Evidence of a possible age undetermined inferior infarct present. Comparison: Similar to previous tracing obtained on 12/29/2022   IMAGING / PROCEDURES: CT RENAL STONE STUDY performed on 05/12/2023 Distended urinary bladder, which contains a large amount of blood clot  Mild bilateral hydronephrosis and ureterectasis, likely due to the distended state of the  bladder. No evidence of urolithiasis. Mild-to-moderately enlarged prostate  MR LUMBAR SPINE WO CONTRAST performed on 03/26/2023 Severe spinal canal narrowing at L2-L3 secondary to a combination of ligamentum flavum hypertrophy, short pedicles, and a disc bulge. Severe left and moderate right neural foraminal narrowing at L5-S1.   TRANSTHORACIC ECHOCARDIOGRAM performed on 12/09/2021 Left ventricular ejection fraction, by estimation, is 45 to 50%. The left ventricle has mildly decreased function. The left ventricle  demonstrates mild global hypokinesis with moderate to severe hypokinesis  of the inferior and basal  to mid septal  wall. Left ventricular diastolic parameters are consistent with Grade I  diastolic dysfunction (impaired relaxation). The average left ventricular  global longitudinal strain is -15.3 %.  Right ventricular systolic function is normal. The right ventricular size is normal. Tricuspid regurgitation signal is inadequate for assessing PA pressure.  The mitral valve is normal in structure. Mild mitral valve regurgitation. No evidence of mitral stenosis.  The aortic valve is tricuspid. Aortic valve regurgitation is not visualized. No aortic stenosis is present.  The inferior vena cava is normal in size with greater than 50% respiratory variability, suggesting right atrial pressure of 3 mmHg.    EXERCISE TOLERANCE TEST performed on 10/14/2019 No ST segment deviation noted during stress No T wave inversion noted during stress Very few PVCs in recovery Average exercise capacity with exercise time of 6 minutes 49 seconds achieving 87% MPHR and 8.2 METS Normal blood pressure at baseline with mild hypertensive response to exercise  Normal treadmill stress test with no evidence of ischemia   LEFT HEART CATHETERIZATION AND CORONARY ANGIOGRAPHY performed on 07/21/2017 Severely reduced left ventricular systolic function with an EF of 25-35% Mildly elevated LVEDP = 14 mmHg Inferior  wall akinesis as well as hypokinesis of the basal anterior wall Multivessel CAD 40% proximal LCx 40% mid LCx 100% distal LCx (chronic with collaterals) 100% proximal RCA (chronic with well-developed left-to-right collaterals) Unsuccessful coronary angioplasty of the RCA due to inability to cross with 2 wires    Impression and Plan:  Lawrence Holmes [place] has been referred for pre-anesthesia review and clearance prior to him undergoing the planned anesthetic and procedural courses. Available labs, pertinent testing, and imaging results were personally reviewed by me in preparation for upcoming operative/procedural course. [facility] medical record has been updated following extensive record review and patient interview with PAT staff.   This patient has been appropriately cleared by cardiology with an overall ACCEPTABLE risk of experiencing significant perioperative cardiovascular complications. Based on clinical review performed today (06/06/23), barring any significant acute changes in the patient's overall condition, it is anticipated that he will be able to proceed with the planned surgical intervention. Any acute changes in clinical condition may necessitate his procedure being postponed and/or cancelled. Patient will meet with anesthesia team (MD and/or CRNA) on the day of his procedure for preoperative evaluation/assessment. Questions regarding anesthetic course will be fielded at that time.   Pre-surgical instructions were reviewed with the patient during his PAT appointment, and questions were fielded to satisfaction by PAT clinical staff. He has been instructed on which medications that he will need to hold prior to surgery, as well as the ones that have been deemed safe/appropriate to take on the day of his procedure. As part of the general education provided by PAT, patient made aware both verbally and in writing, that he would need to abstain from the use of any illegal substances during his  perioperative course.  He was advised that failure to follow the provided instructions could necessitate case cancellation or result in serious perioperative complications up to and including death. Patient encouraged to contact PAT and/or his surgeon's office to discuss any questions or concerns that may arise prior to surgery; verbalized understanding.   Quentin Mulling, MSN, APRN, FNP-C, CEN [facility]  Perioperative Services Nurse Practitioner Phone: [phone] Fax: [phone] 06/06/23 10:25 AM  NOTE: This note has been prepared using Dragon dictation software. Despite my best ability to proofread, there is always the potential that unintentional  transcriptional errors may still occur from this process.

## 2023-06-06 ENCOUNTER — Encounter: Payer: Self-pay | Admitting: Physical Therapy

## 2023-06-06 ENCOUNTER — Ambulatory Visit: Payer: 59 | Admitting: Physical Therapy

## 2023-06-06 DIAGNOSIS — R262 Difficulty in walking, not elsewhere classified: Secondary | ICD-10-CM

## 2023-06-06 DIAGNOSIS — M6281 Muscle weakness (generalized): Secondary | ICD-10-CM | POA: Diagnosis not present

## 2023-06-06 DIAGNOSIS — R2681 Unsteadiness on feet: Secondary | ICD-10-CM

## 2023-06-08 ENCOUNTER — Ambulatory Visit: Payer: 59 | Admitting: Urgent Care

## 2023-06-08 ENCOUNTER — Ambulatory Visit
Admission: RE | Admit: 2023-06-08 | Discharge: 2023-06-08 | Disposition: A | Discharge status: Home / Self Care | Payer: 59 | Attending: Urology | Admitting: Urology

## 2023-06-08 ENCOUNTER — Other Ambulatory Visit: Payer: Self-pay

## 2023-06-08 ENCOUNTER — Ambulatory Visit: Payer: 59 | Admitting: Medical

## 2023-06-08 ENCOUNTER — Encounter
Admission: RE | Disposition: A | Discharge status: Home / Self Care | Payer: Self-pay | Source: Home / Self Care | Attending: Urology

## 2023-06-08 ENCOUNTER — Encounter: Payer: Self-pay | Admitting: Urology

## 2023-06-08 DIAGNOSIS — I5022 Chronic systolic (congestive) heart failure: Secondary | ICD-10-CM | POA: Insufficient documentation

## 2023-06-08 DIAGNOSIS — I110 Hypertensive heart disease with heart failure: Secondary | ICD-10-CM | POA: Insufficient documentation

## 2023-06-08 DIAGNOSIS — I2510 Atherosclerotic heart disease of native coronary artery without angina pectoris: Secondary | ICD-10-CM | POA: Insufficient documentation

## 2023-06-08 DIAGNOSIS — E785 Hyperlipidemia, unspecified: Secondary | ICD-10-CM | POA: Insufficient documentation

## 2023-06-08 DIAGNOSIS — N411 Chronic prostatitis: Secondary | ICD-10-CM | POA: Insufficient documentation

## 2023-06-08 DIAGNOSIS — Z7982 Long term (current) use of aspirin: Secondary | ICD-10-CM | POA: Insufficient documentation

## 2023-06-08 DIAGNOSIS — I252 Old myocardial infarction: Secondary | ICD-10-CM | POA: Insufficient documentation

## 2023-06-08 DIAGNOSIS — R338 Other retention of urine: Secondary | ICD-10-CM | POA: Insufficient documentation

## 2023-06-08 DIAGNOSIS — E119 Type 2 diabetes mellitus without complications: Secondary | ICD-10-CM | POA: Insufficient documentation

## 2023-06-08 DIAGNOSIS — N401 Enlarged prostate with lower urinary tract symptoms: Secondary | ICD-10-CM | POA: Insufficient documentation

## 2023-06-08 DIAGNOSIS — Z7984 Long term (current) use of oral hypoglycemic drugs: Secondary | ICD-10-CM | POA: Diagnosis not present

## 2023-06-08 DIAGNOSIS — R310 Gross hematuria: Secondary | ICD-10-CM | POA: Insufficient documentation

## 2023-06-08 DIAGNOSIS — I700 Atherosclerosis of aorta: Secondary | ICD-10-CM | POA: Insufficient documentation

## 2023-06-08 DIAGNOSIS — F17210 Nicotine dependence, cigarettes, uncomplicated: Secondary | ICD-10-CM | POA: Insufficient documentation

## 2023-06-08 DIAGNOSIS — N410 Acute prostatitis: Secondary | ICD-10-CM

## 2023-06-08 DIAGNOSIS — N138 Other obstructive and reflux uropathy: Secondary | ICD-10-CM | POA: Diagnosis not present

## 2023-06-08 DIAGNOSIS — M1990 Unspecified osteoarthritis, unspecified site: Secondary | ICD-10-CM | POA: Diagnosis not present

## 2023-06-08 DIAGNOSIS — I255 Ischemic cardiomyopathy: Secondary | ICD-10-CM | POA: Diagnosis not present

## 2023-06-08 HISTORY — DX: Other obstructive and reflux uropathy: N13.8

## 2023-06-08 HISTORY — DX: Benign prostatic hyperplasia with lower urinary tract symptoms: N40.1

## 2023-06-08 HISTORY — PX: HOLEP-LASER ENUCLEATION OF THE PROSTATE WITH MORCELLATION: SHX6641

## 2023-06-08 LAB — GLUCOSE, CAPILLARY
Glucose-Capillary: 90 mg/dL (ref 70–99)
Glucose-Capillary: 139 mg/dL — ABNORMAL HIGH (ref 70–99)

## 2023-06-08 SURGERY — ENUCLEATION, PROSTATE, USING LASER, WITH MORCELLATION
Anesthesia: General | Site: Prostate

## 2023-06-08 MED ORDER — LIDOCAINE HCL (CARDIAC) PF 100 MG/5ML IV SOSY
PREFILLED_SYRINGE | INTRAVENOUS | Status: DC | PRN
  Administered 2023-06-08: 100 mg via INTRAVENOUS

## 2023-06-08 MED ORDER — TRAMADOL HCL 50 MG PO TABS: 25.0000 mg | ORAL_TABLET | Freq: Four times a day (QID) | ORAL | 0 refills | Status: AC | PRN

## 2023-06-08 MED ORDER — CEFAZOLIN SODIUM-DEXTROSE 2-4 GM/100ML-% IV SOLN
2.0000 g | INTRAVENOUS | Status: AC
  Administered 2023-06-08: 2 g via INTRAVENOUS

## 2023-06-08 MED ORDER — FENTANYL CITRATE (PF) 100 MCG/2ML IJ SOLN
INTRAMUSCULAR | Status: DC | PRN
  Administered 2023-06-08 (×2): 50 ug via INTRAVENOUS

## 2023-06-08 MED ORDER — MIDAZOLAM HCL 2 MG/2ML IJ SOLN
INTRAMUSCULAR | Status: AC
  Filled 2023-06-08: qty 2

## 2023-06-08 MED ORDER — PHENYLEPHRINE 80 MCG/ML (10ML) SYRINGE FOR IV PUSH (FOR BLOOD PRESSURE SUPPORT)
PREFILLED_SYRINGE | INTRAVENOUS | Status: DC | PRN
  Administered 2023-06-08: 80 ug via INTRAVENOUS

## 2023-06-08 MED ORDER — ORAL CARE MOUTH RINSE: 15.0000 mL | Freq: Once | OROMUCOSAL | Status: AC

## 2023-06-08 MED ORDER — SUCCINYLCHOLINE CHLORIDE 200 MG/10ML IV SOSY
PREFILLED_SYRINGE | INTRAVENOUS | Status: DC | PRN
  Administered 2023-06-08: 100 mg via INTRAVENOUS

## 2023-06-08 MED ORDER — SUGAMMADEX SODIUM 200 MG/2ML IV SOLN
INTRAVENOUS | Status: DC | PRN
  Administered 2023-06-08: 200 mg via INTRAVENOUS

## 2023-06-08 MED ORDER — CEFAZOLIN SODIUM-DEXTROSE 2-4 GM/100ML-% IV SOLN
INTRAVENOUS | Status: AC
  Filled 2023-06-08: qty 100

## 2023-06-08 MED ORDER — EPHEDRINE SULFATE-NACL 50-0.9 MG/10ML-% IV SOSY
PREFILLED_SYRINGE | INTRAVENOUS | Status: DC | PRN
  Administered 2023-06-08: 5 mg via INTRAVENOUS

## 2023-06-08 MED ORDER — VASOPRESSIN 20 UNIT/ML IV SOLN
INTRAVENOUS | Status: DC | PRN
Start: 2023-06-08 — End: 2023-06-08
  Administered 2023-06-08: 2 [IU] via INTRAVENOUS

## 2023-06-08 MED ORDER — SODIUM CHLORIDE 0.9 % IR SOLN
Status: DC | PRN
  Administered 2023-06-08: 180000 mL

## 2023-06-08 MED ORDER — ONDANSETRON HCL 4 MG/2ML IJ SOLN
INTRAMUSCULAR | Status: DC | PRN
  Administered 2023-06-08 (×2): 4 mg via INTRAVENOUS

## 2023-06-08 MED ORDER — SODIUM CHLORIDE 0.9 % IV SOLN: INTRAVENOUS | Status: DC

## 2023-06-08 MED ORDER — PROPOFOL 10 MG/ML IV BOLUS
INTRAVENOUS | Status: DC | PRN
  Administered 2023-06-08: 150 mg via INTRAVENOUS

## 2023-06-08 MED ORDER — CHLORHEXIDINE GLUCONATE 0.12 % MT SOLN
15.0000 mL | Freq: Once | OROMUCOSAL | Status: AC
  Administered 2023-06-08: 15 mL via OROMUCOSAL

## 2023-06-08 MED ORDER — DEXAMETHASONE SODIUM PHOSPHATE 10 MG/ML IJ SOLN
INTRAMUSCULAR | Status: DC | PRN
  Administered 2023-06-08: 5 mg via INTRAVENOUS

## 2023-06-08 MED ORDER — PHENYLEPHRINE HCL-NACL 20-0.9 MG/250ML-% IV SOLN
INTRAVENOUS | Status: DC | PRN
Start: 2023-06-08 — End: 2023-06-08
  Administered 2023-06-08: 25 ug/min via INTRAVENOUS

## 2023-06-08 MED ORDER — ACETAMINOPHEN 10 MG/ML IV SOLN
INTRAVENOUS | Status: DC | PRN
  Administered 2023-06-08: 1000 mg via INTRAVENOUS

## 2023-06-08 MED ORDER — FENTANYL CITRATE (PF) 100 MCG/2ML IJ SOLN
INTRAMUSCULAR | Status: AC
  Filled 2023-06-08: qty 2

## 2023-06-08 MED ORDER — GLYCOPYRROLATE 0.2 MG/ML IJ SOLN
INTRAMUSCULAR | Status: DC | PRN
  Administered 2023-06-08: .2 mg via INTRAVENOUS

## 2023-06-08 MED ORDER — CHLORHEXIDINE GLUCONATE 0.12 % MT SOLN
OROMUCOSAL | Status: AC
  Filled 2023-06-08: qty 15

## 2023-06-08 MED ORDER — ACETAMINOPHEN 10 MG/ML IV SOLN
INTRAVENOUS | Status: AC
  Filled 2023-06-08: qty 100

## 2023-06-08 MED ORDER — ROCURONIUM BROMIDE 100 MG/10ML IV SOLN
INTRAVENOUS | Status: DC | PRN
  Administered 2023-06-08: 50 mg via INTRAVENOUS

## 2023-06-08 SURGICAL SUPPLY — 39 items
ADAPTER IRRIG TUBE 2 SPIKE SOL (ADAPTER) ×2 IMPLANT
ADPR TBG 2 SPK PMP STRL ASCP (ADAPTER) ×2
BAG DRAIN SIEMENS DORNER NS (MISCELLANEOUS) IMPLANT
BAG DRN LRG CPC RND TRDRP CNTR (MISCELLANEOUS) ×1
BAG DRN NS LF (MISCELLANEOUS) ×1
BAG DRN RND TRDRP ANRFLXCHMBR (UROLOGICAL SUPPLIES)
BAG URINE DRAIN 2000ML AR STRL (UROLOGICAL SUPPLIES) ×1 IMPLANT
BAG URO DRAIN 4000ML (MISCELLANEOUS) ×1 IMPLANT
CATH FOLEY 3WAY 30CC 24FR (CATHETERS) ×1
CATH URETL OPEN END 4X70 (CATHETERS) ×1 IMPLANT
CATH URTH STD 24FR FL 3W 2 (CATHETERS) ×1 IMPLANT
CONTAINER COLLECT MORCELLATR (MISCELLANEOUS) ×1 IMPLANT
DRAPE UTILITY 15X26 TOWEL STRL (DRAPES) IMPLANT
ELECT BIVAP BIPO 22/24 DONUT (ELECTROSURGICAL)
ELECTRD BIVAP BIPO 22/24 DONUT (ELECTROSURGICAL) IMPLANT
FIBER LASER MOSES 550 DFL (Laser) ×1 IMPLANT
FILTER OVERFLOW MORCELLATOR (FILTER) ×1 IMPLANT
GLOVE BIOGEL PI IND STRL 7.5 (GLOVE) ×1 IMPLANT
GOWN STRL REUS W/ TWL LRG LVL3 (GOWN DISPOSABLE) ×1 IMPLANT
GOWN STRL REUS W/ TWL XL LVL3 (GOWN DISPOSABLE) ×1 IMPLANT
GOWN STRL REUS W/TWL LRG LVL3 (GOWN DISPOSABLE) ×1
GOWN STRL REUS W/TWL XL LVL3 (GOWN DISPOSABLE) ×1
HOLDER FOLEY CATH W/STRAP (MISCELLANEOUS) ×1 IMPLANT
IV NS IRRIG 3000ML ARTHROMATIC (IV SOLUTION) ×5 IMPLANT
KIT TURNOVER CYSTO (KITS) ×1 IMPLANT
MBRN O SEALING YLW 17 FOR INST (MISCELLANEOUS) ×1
MEMBRANE SLNG YLW 17 FOR INST (MISCELLANEOUS) ×1 IMPLANT
MORCELLATOR COLLECT CONTAINER (MISCELLANEOUS) ×1
MORCELLATOR OVERFLOW FILTER (FILTER) ×1
MORCELLATOR ROTATION 4.75 335 (MISCELLANEOUS) ×1 IMPLANT
PACK CYSTO AR (MISCELLANEOUS) ×1 IMPLANT
SET CYSTO W/LG BORE CLAMP LF (SET/KITS/TRAYS/PACK) ×1 IMPLANT
SET IRRIG Y TYPE TUR BLADDER L (SET/KITS/TRAYS/PACK) ×1 IMPLANT
SLEEVE PROTECTION STRL DISP (MISCELLANEOUS) ×2 IMPLANT
SURGILUBE 2OZ TUBE FLIPTOP (MISCELLANEOUS) ×1 IMPLANT
SYR TOOMEY IRRIG 70ML (MISCELLANEOUS) ×1
SYRINGE TOOMEY IRRIG 70ML (MISCELLANEOUS) ×1 IMPLANT
TUBE PUMP MORCELLATOR PIRANHA (TUBING) ×1 IMPLANT
WATER STERILE IRR 1000ML POUR (IV SOLUTION) ×1 IMPLANT

## 2023-06-08 NOTE — Discharge Instructions (Signed)

## 2023-06-08 NOTE — Op Note (Signed)
Date of procedure: 06/08/23  Preoperative diagnosis:  BPH with obstruction  Postoperative diagnosis:  Same  Procedure: HoLEP (Holmium Laser Enucleation of the Prostate)  Surgeon: Legrand Rams, MD  Anesthesia: General  Complications: None  Intraoperative findings:  Large median lobe, moderate bladder trabeculations, no suspicious lesions, excellent hemostasis Ureteral orifices and verumontanum intact at conclusion of case  EBL: Minimal  Specimens: Prostate chips  Enucleation time: 14 minutes  Morcellation time: 5 minutes  Intra-op weight: 31g  Drains: 24 French three-way, 60 cc in balloon  Indication: Lawrence Holmes is a 67 y.o. patient with BPH and urinary retention as well as recurrent gross hematuria who opted for HOLEP.  After reviewing the management options for treatment, they elected to proceed with the above surgical procedure(s). We have discussed the potential benefits and risks of the procedure, side effects of the proposed treatment, the likelihood of the patient achieving the goals of the procedure, and any potential problems that might occur during the procedure or recuperation.  We specifically discussed the risks of bleeding, infection, hematuria and clot retention, need for additional procedures, possible overnight hospital stay, temporary urgency and incontinence, rare long-term incontinence, and retrograde ejaculation.  Informed consent has been obtained.   Description of procedure:  The patient was taken to the operating room and general anesthesia was induced.  The patient was placed in the dorsal lithotomy position, prepped and draped in the usual sterile fashion, and preoperative antibiotics(Ancef) were administered.  SCDs were placed for DVT prophylaxis.  A preoperative time-out was performed.   [name] sounds were used to gently dilated the urethra up to 79F. The 59 French continuous flow resectoscope was inserted into the urethra using the visual  obturator  The prostate was large with primarily median lobe hypertrophy that protruded into the bladder. The bladder was thoroughly inspected and notable for moderate trabeculations, no suspicious lesions.  The ureteral orifices were located in orthotopic position.    The laser was set to 2 J and 60 Hz and early apical release was performed by making a circumferential mucosal incision proximal to the sphincter.  A lambda incision was then made proximal to the verumontanum.  The prostate was enucleated en bloc circumferentially into the bladder.  The capsule was examined and laser was used for meticulous hemostasis.    The 31 French resectoscope was then switched out for the 26 French nephroscope and prostate tissue was morcellated(Piranha) and the tissue sent to pathology.  A 24 French three-way catheter was inserted easily with the aid of a catheter guide, and 60 cc were placed in the balloon.  Urine was clear.  The catheter irrigated easily with a Toomey syringe.  CBI was initiated.   The patient tolerated the procedure well without any immediate complications and was extubated and transferred to the recovery room in stable condition.  Urine was clear on fast CBI.  Disposition: Stable to PACU  Plan: Wean CBI in PACU, anticipate discharge home today with Foley removal in clinic in 2-3 days  Legrand Rams, MD 06/08/2023

## 2023-06-08 NOTE — Transfer of Care (Signed)
Immediate Anesthesia Transfer of Care Note  Patient: Lawrence Holmes  Procedure(s) Performed: HOLEP-LASER ENUCLEATION OF THE PROSTATE WITH MORCELLATION (Prostate)  Patient Location: PACU  Anesthesia Type:General  Level of Consciousness: awake, drowsy, and patient cooperative  Airway & Oxygen Therapy: Patient Spontanous Breathing and Patient connected to face mask oxygen  Post-op Assessment: Report given to RN and Post -op Vital signs reviewed and stable  Post vital signs: Reviewed and stable  Last Vitals:  Vitals Value Taken Time  BP 103/61 06/08/23 1007  Temp 36.1 C 06/08/23 1007  Pulse 71 06/08/23 1012  Resp 17 06/08/23 1012  SpO2 98 % 06/08/23 1012  Vitals shown include unfiled device data.  Last Pain:  Vitals:   06/08/23 1007  TempSrc:   PainSc: Asleep         Complications: No notable events documented.

## 2023-06-08 NOTE — Anesthesia Procedure Notes (Signed)
Procedure Name: Intubation Date/Time: 06/08/2023 9:15 AM  Performed by: Mohammed Kindle, CRNAPre-anesthesia Checklist: Patient identified, Emergency Drugs available, Suction available and Patient being monitored Patient Re-evaluated:Patient Re-evaluated prior to induction Oxygen Delivery Method: Circle system utilized Preoxygenation: Pre-oxygenation with 100% oxygen Induction Type: IV induction Ventilation: Mask ventilation without difficulty Laryngoscope Size: McGraph and 3 Grade View: Grade I Tube type: Oral Number of attempts: 1 Airway Equipment and Method: Stylet Placement Confirmation: ETT inserted through vocal cords under direct vision, positive ETCO2, breath sounds checked- equal and bilateral and CO2 detector Secured at: 21 cm Tube secured with: Tape Dental Injury: Teeth and Oropharynx as per pre-operative assessment

## 2023-06-08 NOTE — Anesthesia Postprocedure Evaluation (Signed)
Anesthesia Post Note  Patient: Lawrence Holmes  Procedure(s) Performed: HOLEP-LASER ENUCLEATION OF THE PROSTATE WITH MORCELLATION (Prostate)  Patient location during evaluation: PACU Anesthesia Type: General Level of consciousness: awake and sedated Pain management: pain level controlled Respiratory status: spontaneous breathing and nonlabored ventilation Cardiovascular status: stable Anesthetic complications: no   No notable events documented.   Last Vitals:  Vitals:   06/08/23 1100 06/08/23 1130  BP: (!) 98/54 102/61  Pulse:  60  Resp: (!) 21 16  Temp: (!) 36.1 C (!) 36.3 C  SpO2: 94% 96%    Last Pain:  Vitals:   06/08/23 1130  TempSrc: Temporal  PainSc: 0-No pain                 VAN STAVEREN,Toris Laverdiere

## 2023-06-08 NOTE — Anesthesia Preprocedure Evaluation (Signed)
Anesthesia Evaluation  Patient identified by MRN, date of birth, ID band Patient awake    Reviewed: Allergy & Precautions, NPO status , Patient's Chart, lab work & pertinent test results  Airway Mallampati: II  TM Distance: >3 FB Neck ROM: full    Dental  (+) Upper Dentures, Lower Dentures   Pulmonary neg pulmonary ROS, COPD, Current Smoker and Patient abstained from smoking.   Pulmonary exam normal breath sounds clear to auscultation       Cardiovascular Exercise Tolerance: Good hypertension, Pt. on medications + angina  + CAD, + Past MI and +CHF  negative cardio ROS Normal cardiovascular exam+ dysrhythmias III Rhythm:Regular Rate:Normal     Neuro/Psych negative neurological ROS  negative psych ROS   GI/Hepatic negative GI ROS, Neg liver ROS,,,  Endo/Other  negative endocrine ROSType 2, Oral Hypoglycemic Agents    Renal/GU Renal InsufficiencyRenal disease     Musculoskeletal   Abdominal  (+) + obese  Peds  Hematology negative hematology ROS (+)   Anesthesia Other Findings Past Medical History: No date: Aortic atherosclerosis (HCC) No date: Arthritis No date: BPH with urinary obstruction 07/23/2017: Chronic systolic heart failure (HCC)     Comment:  a.) TTE 07/23/2017: EF 30-35%, mild LVH, inferolateral               and inferior AK, mild BAE, G1DD; b.) TTE 10/23/2017: EF               40-45%, mild LVH, basal-mid inferior and inferoseptal AK,              mild MR; c.) TTE 11/19/2021: EF 45-50%, glob HK with mod               to severe inferior and basal to mid septal HK, GLS               -15.3%, mild MR, G1DD. 07/21/2017: Coronary artery disease     Comment:  a. 07/2017 Late presenting inferior STEMI/Cath: LM nl,               LAD min irregs, LCX 100d (L->L collats), RCA 100p (L->R               collats), EF 25-35%-->Med Rx; b. 10/2017 ETT (DOT): Ex               time 7:38, baseline antlat TWI, no acute  changes; c.               09/2019 ETT: Ex time 6:49. No ECG changes. No date: DDD (degenerative disc disease), lumbar     Comment:  a.) s/p MIS L2-3 laminectomy and disectomy No date: Diverticulosis of colon No date: Erectile dysfunction No date: HTN (hypertension) No date: Hx of bilateral cataract extraction No date: Hyperlipidemia No date: Ischemic cardiomyopathy     Comment:  a.) LHC 07/21/2017: EF 25-35%; b.) TTE 07/23/2017:               30-35%; c.) TTE 10/23/2017: EF 40-45%; d.) TTE               11/19/2021: EF 45-50% No date: Left inguinal hernia     Comment:  a.) s/p repair 05/19/2022 07/21/2017: ST elevation myocardial infarction (STEMI) of inferior  wall (HCC)     Comment:  a.) LHC 07/21/2017: EF 25-35%, LVEDP 14 mmHg. 40% pLCx,               40% mLCx, 100% dLCx, 100% pRCA --> attempted  to cross               wire at RCA lesion, however unable. Reasonable RCA               collaterals already formed --> med mgmt. No date: T2DM (type 2 diabetes mellitus) (HCC) No date: Tobacco use  Past Surgical History: No date: CATARACT EXTRACTION; Bilateral 04/04/2019: COLONOSCOPY WITH PROPOFOL; N/A     Comment:  Procedure: COLONOSCOPY WITH PROPOFOL;  Surgeon:               Pasty Spillers, MD;  Location: [facility];                Service: Endoscopy;  Laterality: N/A; 05/14/2023: CYSTOSCOPY WITH FULGERATION; N/A     Comment:  Procedure: CYSTOSCOPY WITH CLOT EVACUATION AND               FULGERATION;  Surgeon: Vanna Scotland, MD;  Location:               [facility];  Service: Urology;  Laterality: N/A; 07/21/2017: LEFT HEART CATH AND CORONARY ANGIOGRAPHY; N/A     Comment:  Procedure: LEFT HEART CATH AND CORONARY ANGIOGRAPHY;                Surgeon: Iran Ouch, MD;  Location: [facility]               CV [facility];  Service: Cardiovascular;  Laterality: N/A; 04/05/2023: LUMBAR LAMINECTOMY/DECOMPRESSION MICRODISCECTOMY; Bilateral     Comment:  Procedure: MINIMALLY INVASIVE  (MIS) L2-3 LAMINECTOMY AND              DISCECTOMY;  Surgeon: Loreen Freud, MD;                Location: [facility] ORS;  Service: Neurosurgery;  Laterality:               Bilateral; 05/19/2022: XI ROBOTIC ASSISTED INGUINAL HERNIA REPAIR WITH MESH; Left     Comment:  Procedure: XI ROBOTIC ASSISTED INGUINAL HERNIA REPAIR               WITH MESH;  Surgeon: Campbell Lerner, MD;  Location:               [facility];  Service: General;  Laterality: Left;  BMI    Body Mass Index: 27.12 kg/m      Reproductive/Obstetrics negative OB ROS                             Anesthesia Physical Anesthesia Plan  ASA: 3  Anesthesia Plan: General   Post-op Pain Management:    Induction: Intravenous  PONV Risk Score and Plan: Ondansetron, Dexamethasone, Midazolam and Treatment may vary due to age or medical condition  Airway Management Planned: Oral ETT  Additional Equipment:   Intra-op Plan:   Post-operative Plan: Extubation in OR  Informed Consent: I have reviewed the patients History and Physical, chart, labs and discussed the procedure including the risks, benefits and alternatives for the proposed anesthesia with the patient or authorized representative who has indicated his/her understanding and acceptance.     Dental Advisory Given  Plan Discussed with: CRNA and Surgeon  Anesthesia Plan Comments:        Anesthesia Quick Evaluation

## 2023-06-08 NOTE — H&P (Signed)
06/08/23 9:03 AM   Lawrence Holmes [place] 1955/09/01 [id number]  CC: BPH and urinary retention  HPI: 67 year old male who I originally met in October 2023 for BPH and urinary symptoms, elevated PVRs of approximately 300 mL, and prostate measured 80 g on CT.  He deferred outlet procedures and opted for a trial of Flomax which improved his urinary symptoms and initially normalized his PVRs.  PSA at that time was 4.8, but with reassuring 23% free and PSA density of 0.06.  Using shared decision making he opted to defer prostate biopsy.   He was admitted to [facility] on 05/12/2023 with urinary retention and gross hematuria felt to be secondary to BPH and prostatic bleeding.  He ultimately went to the OR on 05/14/2023 with Dr. Apolinar Junes for cystoscopy, clot evacuation of minimal clot, and fulguration of some prostatic bleeding.  No abnormal bladder lesions were identified.  There was a large median lobe.  I personally viewed and interpreted the CT scan dated 05/12/2023 showing distended bladder with large amount of blood clot, mild bilateral hydronephrosis down to the distended bladder.  He then failed a voiding trial postoperatively(05/15/23) with PVRs of , and opted for HOLEP.   PMH: Past Medical History:  Diagnosis Date   Aortic atherosclerosis (HCC)    Arthritis    BPH with urinary obstruction    Chronic systolic heart failure (HCC) 07/23/2017   a.) TTE 07/23/2017: EF 30-35%, mild LVH, inferolateral and inferior AK, mild BAE, G1DD; b.) TTE 10/23/2017: EF 40-45%, mild LVH, basal-mid inferior and inferoseptal AK, mild MR; c.) TTE 11/19/2021: EF 45-50%, glob HK with mod to severe inferior and basal to mid septal HK, GLS -15.3%, mild MR, G1DD.   Coronary artery disease 07/21/2017   a. 07/2017 Late presenting inferior STEMI/Cath: LM nl, LAD min irregs, LCX 100d (L->L collats), RCA 100p (L->R collats), EF 25-35%-->Med Rx; b. 10/2017 ETT (DOT): Ex time 7:38, baseline antlat TWI, no acute changes; c. 09/2019 ETT: Ex  time 6:49. No ECG changes.   DDD (degenerative disc disease), lumbar    a.) s/p MIS L2-3 laminectomy and disectomy   Diverticulosis of colon    Erectile dysfunction    HTN (hypertension)    Hx of bilateral cataract extraction    Hyperlipidemia    Ischemic cardiomyopathy    a.) LHC 07/21/2017: EF 25-35%; b.) TTE 07/23/2017: 30-35%; c.) TTE 10/23/2017: EF 40-45%; Holmes.) TTE 11/19/2021: EF 45-50%   Left inguinal hernia    a.) s/p repair 05/19/2022   ST elevation myocardial infarction (STEMI) of inferior wall (HCC) 07/21/2017   a.) LHC 07/21/2017: EF 25-35%, LVEDP 14 mmHg. 40% pLCx, 40% mLCx, 100% dLCx, 100% pRCA --> attempted to cross wire at RCA lesion, however unable. Reasonable RCA collaterals already formed --> med mgmt.   T2DM (type 2 diabetes mellitus) (HCC)    Tobacco use     Surgical History: Past Surgical History:  Procedure Laterality Date   CATARACT EXTRACTION Bilateral    COLONOSCOPY WITH PROPOFOL N/A 04/04/2019   Procedure: COLONOSCOPY WITH PROPOFOL;  Surgeon: Pasty Spillers, MD;  Location: [facility];  Service: Endoscopy;  Laterality: N/A;   CYSTOSCOPY WITH FULGERATION N/A 05/14/2023   Procedure: CYSTOSCOPY WITH CLOT EVACUATION AND FULGERATION;  Surgeon: Vanna Scotland, MD;  Location: [facility]S;  Service: Urology;  Laterality: N/A;   LEFT HEART CATH AND CORONARY ANGIOGRAPHY N/A 07/21/2017   Procedure: LEFT HEART CATH AND CORONARY ANGIOGRAPHY;  Surgeon: Iran Ouch, MD;  Location: [facility] CV [facility];  Service: Cardiovascular;  Laterality: N/A;   LUMBAR LAMINECTOMY/DECOMPRESSION MICRODISCECTOMY Bilateral 04/05/2023   Procedure: MINIMALLY INVASIVE (MIS) L2-3 LAMINECTOMY AND DISCECTOMY;  Surgeon: Loreen Freud, MD;  Location: [facility];  Service: Neurosurgery;  Laterality: Bilateral;   XI ROBOTIC ASSISTED INGUINAL HERNIA REPAIR WITH MESH Left 05/19/2022   Procedure: XI ROBOTIC ASSISTED INGUINAL HERNIA REPAIR WITH MESH;  Surgeon: Campbell Lerner, MD;  Location:  [facility] OR[facility];  Service: General;  Laterality: Left;   Family History: Family History  Problem Relation Age of Onset   Heart disease Mother    Hyperlipidemia Mother    Hypertension Mother    Heart attack Sister     Social History:  reports that he has been smoking cigarettes. He started smoking about 48 years ago. He has a 48.8 pack-year smoking history. He has been exposed to tobacco smoke. He has never used smokeless tobacco. He reports current alcohol use. He reports that he does not use drugs.  Physical Exam: BP (!) 107/58   Pulse 74   Temp (!) 97 F (36.1 C) (Temporal)   Resp 15   Ht 6' (1.829 m)   Wt 90.7 kg   SpO2 97%   BMI 27.12 kg/m    Constitutional:  Alert and oriented, No acute distress. Cardiovascular: Regular rate and rhythm Respiratory: Clear to auscultation bilaterally GI: Abdomen is soft, nontender, nondistended, no abdominal masses   Laboratory Data: Culture with mixed species, treated with Bactrim  Assessment & Plan:   67 year old male with 80 g prostate and recurrent urinary retention as well as gross hematuria secondary to BPH.  We discussed the risks and benefits of HoLEP at length.  The procedure requires general anesthesia and takes 1 to 2 hours, and a holmium laser is used to enucleate the prostate and push this tissue into the bladder.  A morcellator is then used to remove this tissue, which is sent for pathology.  The vast majority(>95%) of patients are able to discharge the same day with a catheter in place for 2 to 3 days, and will follow-up in clinic for a voiding trial.  We specifically discussed the risks of bleeding, infection, retrograde ejaculation, temporary urgency and urge incontinence, very low risk of long-term incontinence, urethral stricture/bladder neck contracture, pathologic evaluation of prostate tissue and possible detection of prostate cancer or other malignancy, and possible need for additional procedures.  HOLEP today  Legrand Rams, MD 06/08/2023  [facility] [address], [address] [place], [place] [address] [phone]

## 2023-06-10 NOTE — Plan of Care (Signed)
[facility] Tonsillectomy/Adenoidectomy, Postoperative PEDS care plan entered in error.

## 2023-06-11 ENCOUNTER — Ambulatory Visit: Payer: 59 | Admitting: Physician Assistant

## 2023-06-11 VITALS — BP 145/70 | HR 91 | Ht 72.0 in | Wt 200.0 lb

## 2023-06-11 DIAGNOSIS — N401 Enlarged prostate with lower urinary tract symptoms: Secondary | ICD-10-CM

## 2023-06-11 DIAGNOSIS — N138 Other obstructive and reflux uropathy: Secondary | ICD-10-CM

## 2023-06-11 LAB — SURGICAL PATHOLOGY

## 2023-06-11 NOTE — Addendum Note (Signed)
Addended by: Jacquelin Hawking on: 06/11/2023 10:19 AM   Modules accepted: Level of Service

## 2023-06-11 NOTE — Progress Notes (Signed)
Catheter Removal  Patient is present today for a catheter removal.  57ml of water was drained from the balloon. A 24FR three-way foley cath was removed from the bladder, no complications were noted. Patient tolerated well.  Performed by: Carman Ching, PA-C   Additional notes: Counseled patient on normal postoperative findings including dysuria, gross hematuria, and urinary urgency/leakage. Counseled patient to begin Kegel exercises 3x10 sets daily to increase urinary control and wear absorbent products as needed for security. Written and verbal resources provided today. Surgical pathology pending; will defer to Dr. Richardo Hanks to share results when available.   Follow up/ Additional notes: Return in about 4 months (around 10/12/2023) for Postop f/u with Dr. Richardo Hanks.

## 2023-06-11 NOTE — Patient Instructions (Signed)

## 2023-06-12 ENCOUNTER — Ambulatory Visit: Payer: 59 | Admitting: Physical Therapy

## 2023-06-14 ENCOUNTER — Ambulatory Visit: Payer: 59

## 2023-06-14 DIAGNOSIS — R2681 Unsteadiness on feet: Secondary | ICD-10-CM

## 2023-06-14 DIAGNOSIS — R278 Other lack of coordination: Secondary | ICD-10-CM

## 2023-06-14 DIAGNOSIS — M6281 Muscle weakness (generalized): Secondary | ICD-10-CM

## 2023-06-14 DIAGNOSIS — R262 Difficulty in walking, not elsewhere classified: Secondary | ICD-10-CM

## 2023-06-14 NOTE — Therapy (Signed)
OUTPATIENT PHYSICAL THERAPY THORACOLUMBAR and NEURO TREATMENT   Patient Name: Lawrence Holmes MRN: [id number] DOB:[birth year], 67 y.o., male Today's Date: 06/14/2023  END OF SESSION:  PT End of Session - 06/14/23 1455     Visit Number 5    Number of Visits 16    Date for PT Re-Evaluation 07/24/23    Progress Note Due on Visit 10    PT Start Time 1453    PT Stop Time 1530    PT Time Calculation (min) 37 min    Equipment Utilized During Treatment Gait belt    Activity Tolerance Patient tolerated treatment well;No increased pain    Behavior During Therapy [facility] for tasks assessed/performed              Past Medical History:  Diagnosis Date   Aortic atherosclerosis (HCC)    Arthritis    BPH with urinary obstruction    Chronic systolic heart failure (HCC) 07/23/2017   a.) TTE 07/23/2017: EF 30-35%, mild LVH, inferolateral and inferior AK, mild BAE, G1DD; b.) TTE 10/23/2017: EF 40-45%, mild LVH, basal-mid inferior and inferoseptal AK, mild MR; c.) TTE 11/19/2021: EF 45-50%, glob HK with mod to severe inferior and basal to mid septal HK, GLS -15.3%, mild MR, G1DD.   Coronary artery disease 07/21/2017   a. 07/2017 Late presenting inferior STEMI/Cath: LM nl, LAD min irregs, LCX 100d (L->L collats), RCA 100p (L->R collats), EF 25-35%-->Med Rx; b. 10/2017 ETT (DOT): Ex time 7:38, baseline antlat TWI, no acute changes; c. 09/2019 ETT: Ex time 6:49. No ECG changes.   DDD (degenerative disc disease), lumbar    a.) s/p MIS L2-3 laminectomy and disectomy   Diverticulosis of colon    Erectile dysfunction    HTN (hypertension)    Hx of bilateral cataract extraction    Hyperlipidemia    Ischemic cardiomyopathy    a.) LHC 07/21/2017: EF 25-35%; b.) TTE 07/23/2017: 30-35%; c.) TTE 10/23/2017: EF 40-45%; d.) TTE 11/19/2021: EF 45-50%   Left inguinal hernia    a.) s/p repair 05/19/2022   ST elevation myocardial infarction (STEMI) of inferior wall (HCC) 07/21/2017   a.) LHC 07/21/2017: EF 25-35%,  LVEDP 14 mmHg. 40% pLCx, 40% mLCx, 100% dLCx, 100% pRCA --> attempted to cross wire at RCA lesion, however unable. Reasonable RCA collaterals already formed --> med mgmt.   T2DM (type 2 diabetes mellitus) (HCC)    Tobacco use    Past Surgical History:  Procedure Laterality Date   CATARACT EXTRACTION Bilateral    COLONOSCOPY WITH PROPOFOL N/A 04/04/2019   Procedure: COLONOSCOPY WITH PROPOFOL;  Surgeon: Pasty Spillers, MD;  Location: [facility];  Service: Endoscopy;  Laterality: N/A;   CYSTOSCOPY WITH FULGERATION N/A 05/14/2023   Procedure: CYSTOSCOPY WITH CLOT EVACUATION AND FULGERATION;  Surgeon: Vanna Scotland, MD;  Location: [facility];  Service: Urology;  Laterality: N/A;   HOLEP-LASER ENUCLEATION OF THE PROSTATE WITH MORCELLATION N/A 06/08/2023   Procedure: HOLEP-LASER ENUCLEATION OF THE PROSTATE WITH MORCELLATION;  Surgeon: Sondra Come, MD;  Location: [facility];  Service: Urology;  Laterality: N/A;   LEFT HEART CATH AND CORONARY ANGIOGRAPHY N/A 07/21/2017   Procedure: LEFT HEART CATH AND CORONARY ANGIOGRAPHY;  Surgeon: Iran Ouch, MD;  Location: [facility];  Service: Cardiovascular;  Laterality: N/A;   LUMBAR LAMINECTOMY/DECOMPRESSION MICRODISCECTOMY Bilateral 04/05/2023   Procedure: MINIMALLY INVASIVE (MIS) L2-3 LAMINECTOMY AND DISCECTOMY;  Surgeon: Loreen Freud, MD;  Location: [facility];  Service: Neurosurgery;  Laterality: Bilateral;   XI ROBOTIC  ASSISTED INGUINAL HERNIA REPAIR WITH MESH Left 05/19/2022   Procedure: XI ROBOTIC ASSISTED INGUINAL HERNIA REPAIR WITH MESH;  Surgeon: Campbell Lerner, MD;  Location: [facility];  Service: General;  Laterality: Left;   Patient Active Problem List   Diagnosis Date Noted   S/P lumbar microdiscectomy 05/16/2023   Hematuria 05/12/2023   Acute urinary retention 05/12/2023   Bilateral hydronephrosis 05/12/2023   HTN (hypertension) 05/12/2023   HLD (hyperlipidemia) 05/12/2023   Type II diabetes mellitus with  renal manifestations (HCC) 05/12/2023   Chronic kidney disease, stage 3a (HCC) 05/12/2023   Chronic systolic CHF (congestive heart failure) (HCC) 05/12/2023   BPH (benign prostatic hyperplasia) 05/12/2023   UTI (urinary tract infection) 05/12/2023   CAD (coronary artery disease) 05/12/2023   Intervertebral lumbar disc disorder with myelopathy, lumbar region 04/02/2023   Impaired functional mobility, balance, gait, and endurance 03/26/2023   Bilateral leg weakness 03/26/2023   Abnormal prostate exam 05/09/2022   Aortic atherosclerosis (HCC) 02/24/2022   Smokes with greater than 30 pack year history 11/18/2021   Coronary artery disease of native artery of native heart with stable angina pectoris (HCC) 05/19/2021   Dyslipidemia 07/30/2020   Polyp of colon    Type 2 diabetes mellitus, controlled (HCC) 02/08/2018   History of myocardial infarction 11/08/2017   Tobacco use 11/08/2017   Ischemic cardiomyopathy 11/08/2017   CHF (congestive heart failure) (HCC) 11/08/2017   Foot callus 11/08/2017   Onychomycosis of multiple toenails with type 2 diabetes mellitus (HCC) 11/08/2017   Cataract, nuclear sclerotic, left eye 04/04/2016    PCP: Danelle Berry, PA-C  REFERRING PROVIDER: Oswaldo Conroy Mecum, PA-C  REFERRING DIAG:  M51.06 (ICD-10-CM) - Intervertebral lumbar disc disorder with myelopathy, lumbar region  Z74.09 (ICD-10-CM) - Impaired functional mobility, balance, gait, and endurance  R29.898 (ICD-10-CM) - Bilateral leg weakness  Z98.890 (ICD-10-CM) - S/P lumbar microdiscectomy    Rationale for Evaluation and Treatment: Rehabilitation  THERAPY DIAG:  Muscle weakness (generalized)  Difficulty in walking, not elsewhere classified  Unsteadiness on feet  Other lack of coordination  ONSET DATE: 03/2023  SUBJECTIVE:                                                                                                                                                                                            SUBJECTIVE STATEMENT:  Pt reports no complaints upon arrival.  Pt states back is doing ok and reports 2/10 pain level upon arrival.   PERTINENT HISTORY:  Approximately 6 weeks status post Right L2-L3 Laminectomy and microdiscectomy (04/05/23).  He states that he has graduated from his wheelchair and is almost only using his walker,  trying to graduate to his cane.  Patient has a past medical history of arthritis, congestive heart failure, CID, CDD, enlarged prostate, HTN, HLD, left inguinal hernia, type 2 diabetes. 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    PAIN:  Are you having pain? No  Current: 0/10, At worst: 3/10  Pt denies reports of radiating pain, localizes to one area    PRECAUTIONS: Back   He can increase his weight to 25 pounds  RED FLAGS: None   WEIGHT BEARING RESTRICTIONS:  No lifting more than 25 pounds   FALLS:  Has patient fallen in last 6 months? No  LIVING ENVIRONMENT: Lives with: lives alone Lives in: House/apartment Stairs: Yes: External: 3 steps; on left going up Has following equipment at home: Single point cane and Walker - 2 wheeled, grab bars in shower   OCCUPATION: Engineer, drilling   PLOF: Independent  PATIENT GOALS: "Get back to walking without a walker or cane"   NEXT MD VISIT: 06/08/23 : prostate procedure   OBJECTIVE:  Note: Objective measures were completed at Evaluation unless otherwise noted.  DIAGNOSTIC FINDINGS:  IMPRESSION from MRI 03/26/23 1. Severe spinal canal narrowing at L2-L3 secondary to a combination of ligamentum flavum hypertrophy, short pedicles, and a disc bulge. 2. Severe left and moderate right neural foraminal narrowing at L5-S1.   PATIENT SURVEYS:  FOTO 05/29/23: 38  Typical result : 51  SCREENING FOR RED FLAGS: Bowel or bladder incontinence: No, Yes for bladder due to prostate Spinal tumors: No Cauda equina  syndrome: No Compression fracture: No Abdominal aneurysm: No  COGNITION: Overall cognitive status: Within functional limits for tasks assessed     SENSATION: Light touch: WFL Coordination intact   MUSCLE LENGTH: Hamstrings: Right WNL deg; Left 56 deg   POSTURE: rounded shoulders and increased thoracic kyphosis  PALPATION: Tightness of bilateral lumbar paraspinals and quadratus lumborum  Incision neat, healing well.   LUMBAR ROM:   Not tested due to precautions  AROM eval  Flexion   Extension   Right lateral flexion   Left lateral flexion   Right rotation   Left rotation    (Blank rows = not tested)   LOWER EXTREMITY MMT:    MMT Right eval Left eval  Hip flexion 4-/5 4-/5  Hip extension    Hip abduction 4 4  Hip adduction 4- 4  Hip internal rotation    Hip external rotation    Knee flexion 4-/5 4-/5  Knee extension 4+ 4+  Ankle dorsiflexion 4/5 4/5  Ankle plantarflexion 4-/5 4-/5  Ankle inversion    Ankle eversion     (Blank rows = not tested)  LUMBAR SPECIAL TESTS:  Deferred due to precautions Hip special tests: FABER and Scour negative SLR : negative   FUNCTIONAL TESTS:  5 times sit to stand: 16.59s intermittent hand use 10 meter walk test: 20.97  .37m/s with walker   GAIT: Distance walked: 12ft  Assistive device utilized: Environmental consultant - 2 wheeled Level of assistance: CGA Comments: External rotation at the hips, kyphotic posture, poor foot clearance bilateral with L> R. Decreased heel strike with foot flat noted bilaterally  TODAY'S TREATMENT: DATE: 06/14/23   TherEx   NuStep L1 for 2.5 minutes, L3 for 2.5 minutes, L1 for 1 minute cool-down while subjective was gathered  Seated marches, 5# AW donned, 2x10 Seated LAQ's 5# AW donned, 2x10 Seated hip adduction squeezes into physioball, 5 sec holds, 2x10 Seated heel raises  2x15 reps with 5# AW Seated hamstring curls with GTB, 2x10 Seated leg press, 75#, 2x10 Ambulation around the gym with 5#  AW and SPC, x1 lap     PATIENT EDUCATION:  Education details: HEP, POC, goals  Person educated: Patient Education method: Explanation, Demonstration, and Handouts Education comprehension: verbalized understanding and returned demonstration  HOME EXERCISE PROGRAM: Access Code: [email or web address] URL: [email or web address] Date: 05/29/2023 Prepared by: Precious Bard  Exercises - Standing March with Counter Support  - 1 x daily - 7 x weekly - 2 sets - 10 reps - 5 hold - Heel Raises with Counter Support  - 1 x daily - 7 x weekly - 2 sets - 10 reps - 5 hold - Sit to Stand Without Arm Support  - 1 x daily - 7 x weekly - 2 sets - 10 reps - 5 hold  ASSESSMENT:  CLINICAL IMPRESSION:  Pt put forth great effort throughout the session and was able to perform exercises without complications.  Pt noted to be feeling fatigued at the conclusion specifically following the leg press, but enjoyed performing the exercise.  Pt advised to continue to perform HEP in order to improve overall LE strength.   Pt will continue to benefit from skilled therapy to address remaining deficits in order to improve overall QoL and return to PLOF.     OBJECTIVE IMPAIRMENTS: Abnormal gait, decreased activity tolerance, decreased balance, decreased endurance, decreased mobility, difficulty walking, decreased strength, impaired flexibility, and improper body mechanics.   ACTIVITY LIMITATIONS: carrying, lifting, bending, standing, squatting, stairs, transfers, bed mobility, dressing, and locomotion level  PARTICIPATION LIMITATIONS: meal prep, cleaning, laundry, driving, shopping, occupation, and yard work  PERSONAL FACTORS: arthritis, congestive heart failure, CID, CDD, enlarged prostate, HTN, HLD, left inguinal hernia, type 2 diabetes are also affecting patient's functional outcome.   REHAB POTENTIAL: Good  CLINICAL DECISION MAKING: Evolving/moderate complexity  EVALUATION COMPLEXITY: Moderate   GOALS: Goals  reviewed with patient? Yes  SHORT TERM GOALS: Target date: 06/26/2023  Patient will be independent in home exercise program to improve strength/mobility for better functional independence with ADLs. Baseline: 05/29/23: HEP given  Goal status: INITIAL   LONG TERM GOALS: Target date: 07/24/23  Patient will increase 10 meter walk test to >1.46m/s as to improve gait speed for better community ambulation and to reduce fall risk. Baseline: .47 m/s Goal status: INITIAL  2.  Patient will increase FOTO score to equal to or greater than 51 to demonstrate statistically significant improvement in mobility and quality of life. Baseline: 05/29/23: 38 Goal status: INITIAL  3.  Patient (> 33 years old) will complete five times sit to stand test in < 15 seconds indicating an increased LE strength and improved balance. Baseline: 16.59 Goal status: INITIAL  4. Patient will ambulate 150 ft  without the use of an assistive device in order to increase independence and improve quality of life.  Baseline: Not tested  Goal status: INITIAL  5.  Patient will increase Berg Balance score by > 6 points to demonstrate decreased fall risk during functional activities. Baseline: awaiting testing until precautions are lifted  Goal status: INITIAL     PLAN:  PT FREQUENCY: 1-2x/week  PT DURATION: 8 weeks  PLANNED INTERVENTIONS: Therapeutic exercises, Therapeutic activity, Neuromuscular re-education, Balance training, Gait training, Patient/Family education, Self Care, Joint mobilization, Stair training, Vestibular training, Canalith repositioning, DME instructions, Dry Needling, Electrical stimulation, Cryotherapy, Moist heat, scar mobilization, Taping, Manual therapy, and Re-evaluation.  PLAN FOR NEXT SESSION: balance, strengthening, endurance,  Nolon Bussing, PT, DPT Physical Therapist - [facility]  06/14/23, 6:21 PM

## 2023-06-17 DIAGNOSIS — I493 Ventricular premature depolarization: Secondary | ICD-10-CM

## 2023-06-18 ENCOUNTER — Ambulatory Visit: Payer: 59 | Admitting: Physical Therapy

## 2023-06-18 DIAGNOSIS — R2681 Unsteadiness on feet: Secondary | ICD-10-CM

## 2023-06-18 DIAGNOSIS — R278 Other lack of coordination: Secondary | ICD-10-CM

## 2023-06-18 DIAGNOSIS — M6281 Muscle weakness (generalized): Secondary | ICD-10-CM

## 2023-06-18 DIAGNOSIS — R262 Difficulty in walking, not elsewhere classified: Secondary | ICD-10-CM

## 2023-06-18 NOTE — Therapy (Signed)
OUTPATIENT PHYSICAL THERAPY THORACOLUMBAR and NEURO TREATMENT   Patient Name: Lawrence Holmes [place] MRN: [id number] DOB:Oct 26, 1955, 67 y.o., male Today's Date: 06/18/2023  END OF SESSION:  PT End of Session - 06/18/23 0930     Visit Number 6    Number of Visits 16    Date for PT Re-Evaluation 07/24/23    Progress Note Due on Visit 10    PT Start Time 0930    PT Stop Time 1010    PT Time Calculation (min) 40 min    Equipment Utilized During Treatment Gait belt    Activity Tolerance Patient tolerated treatment well;No increased pain    Behavior During Therapy [facility] for tasks assessed/performed              Past Medical History:  Diagnosis Date   Aortic atherosclerosis (HCC)    Arthritis    BPH with urinary obstruction    Chronic systolic heart failure (HCC) 07/23/2017   a.) TTE 07/23/2017: EF 30-35%, mild LVH, inferolateral and inferior AK, mild BAE, G1DD; b.) TTE 10/23/2017: EF 40-45%, mild LVH, basal-mid inferior and inferoseptal AK, mild MR; c.) TTE 11/19/2021: EF 45-50%, glob HK with mod to severe inferior and basal to mid septal HK, GLS -15.3%, mild MR, G1DD.   Coronary artery disease 07/21/2017   a. 07/2017 Late presenting inferior STEMI/Cath: LM nl, LAD min irregs, LCX 100d (L->L collats), RCA 100p (L->R collats), EF 25-35%-->Med Rx; b. 10/2017 ETT (DOT): Ex time 7:38, baseline antlat TWI, no acute changes; c. 09/2019 ETT: Ex time 6:49. No ECG changes.   DDD (degenerative disc disease), lumbar    a.) s/p MIS L2-3 laminectomy and disectomy   Diverticulosis of colon    Erectile dysfunction    HTN (hypertension)    Hx of bilateral cataract extraction    Hyperlipidemia    Ischemic cardiomyopathy    a.) LHC 07/21/2017: EF 25-35%; b.) TTE 07/23/2017: 30-35%; c.) TTE 10/23/2017: EF 40-45%; Holmes.) TTE 11/19/2021: EF 45-50%   Left inguinal hernia    a.) s/p repair 05/19/2022   ST elevation myocardial infarction (STEMI) of inferior wall (HCC) 07/21/2017   a.) LHC 07/21/2017: EF 25-35%,  LVEDP 14 mmHg. 40% pLCx, 40% mLCx, 100% dLCx, 100% pRCA --> attempted to cross wire at RCA lesion, however unable. Reasonable RCA collaterals already formed --> med mgmt.   T2DM (type 2 diabetes mellitus) (HCC)    Tobacco use    Past Surgical History:  Procedure Laterality Date   CATARACT EXTRACTION Bilateral    COLONOSCOPY WITH PROPOFOL N/A 04/04/2019   Procedure: COLONOSCOPY WITH PROPOFOL;  Surgeon: Pasty Spillers, MD;  Location: [facility];  Service: Endoscopy;  Laterality: N/A;   CYSTOSCOPY WITH FULGERATION N/A 05/14/2023   Procedure: CYSTOSCOPY WITH CLOT EVACUATION AND FULGERATION;  Surgeon: Vanna Scotland, MD;  Location: [facility];  Service: Urology;  Laterality: N/A;   HOLEP-LASER ENUCLEATION OF THE PROSTATE WITH MORCELLATION N/A 06/08/2023   Procedure: HOLEP-LASER ENUCLEATION OF THE PROSTATE WITH MORCELLATION;  Surgeon: Sondra Come, MD;  Location: [facility] OR[facility];  Service: Urology;  Laterality: N/A;   LEFT HEART CATH AND CORONARY ANGIOGRAPHY N/A 07/21/2017   Procedure: LEFT HEART CATH AND CORONARY ANGIOGRAPHY;  Surgeon: Iran Ouch, MD;  Location: [facility];  Service: Cardiovascular;  Laterality: N/A;   LUMBAR LAMINECTOMY/DECOMPRESSION MICRODISCECTOMY Bilateral 04/05/2023   Procedure: MINIMALLY INVASIVE (MIS) L2-3 LAMINECTOMY AND DISCECTOMY;  Surgeon: Loreen Freud, MD;  Location: [facility] OR[facility];  Service: Neurosurgery;  Laterality: Bilateral;   XI ROBOTIC  ASSISTED INGUINAL HERNIA REPAIR WITH MESH Left 05/19/2022   Procedure: XI ROBOTIC ASSISTED INGUINAL HERNIA REPAIR WITH MESH;  Surgeon: Campbell Lerner, MD;  Location: [facility] OR[facility];  Service: General;  Laterality: Left;   Patient Active Problem List   Diagnosis Date Noted   S/P lumbar microdiscectomy 05/16/2023   Hematuria 05/12/2023   Acute urinary retention 05/12/2023   Bilateral hydronephrosis 05/12/2023   HTN (hypertension) 05/12/2023   HLD (hyperlipidemia) 05/12/2023   Type II diabetes mellitus with  renal manifestations (HCC) 05/12/2023   Chronic kidney disease, stage 3a (HCC) 05/12/2023   Chronic systolic CHF (congestive heart failure) (HCC) 05/12/2023   BPH (benign prostatic hyperplasia) 05/12/2023   UTI (urinary tract infection) 05/12/2023   CAD (coronary artery disease) 05/12/2023   Intervertebral lumbar disc disorder with myelopathy, lumbar region 04/02/2023   Impaired functional mobility, balance, gait, and endurance 03/26/2023   Bilateral leg weakness 03/26/2023   Abnormal prostate exam 05/09/2022   Aortic atherosclerosis (HCC) 02/24/2022   Smokes with greater than 30 pack year history 11/18/2021   Coronary artery disease of native artery of native heart with stable angina pectoris (HCC) 05/19/2021   Dyslipidemia 07/30/2020   Polyp of colon    Type 2 diabetes mellitus, controlled (HCC) 02/08/2018   History of myocardial infarction 11/08/2017   Tobacco use 11/08/2017   Ischemic cardiomyopathy 11/08/2017   CHF (congestive heart failure) (HCC) 11/08/2017   Foot callus 11/08/2017   Onychomycosis of multiple toenails with type 2 diabetes mellitus (HCC) 11/08/2017   Cataract, nuclear sclerotic, left eye 04/04/2016    PCP: Danelle Berry, PA-C  REFERRING PROVIDER: Oswaldo Conroy Mecum, PA-C  REFERRING DIAG:  M51.06 (ICD-10-CM) - Intervertebral lumbar disc disorder with myelopathy, lumbar region  Z74.09 (ICD-10-CM) - Impaired functional mobility, balance, gait, and endurance  R29.898 (ICD-10-CM) - Bilateral leg weakness  Z98.890 (ICD-10-CM) - S/P lumbar microdiscectomy    Rationale for Evaluation and Treatment: Rehabilitation  THERAPY DIAG:  Muscle weakness (generalized)  Difficulty in walking, not elsewhere classified  Unsteadiness on feet  Other lack of coordination  ONSET DATE: 03/2023  SUBJECTIVE:                                                                                                                                                                                            SUBJECTIVE STATEMENT:  Pt reports no complaints upon arrival.  No pain to report. Continues to report no falls or trips, as well as use of SPC for mobility in house in stead of RW. Still feels like LLE is weaker and less coordinated than the RLE.    PERTINENT HISTORY:  Approximately 6 weeks  status post Right L2-L3 Laminectomy and microdiscectomy (04/05/23).  He states that he has graduated from his wheelchair and is almost only using his walker, trying to graduate to his cane.  Patient has a past medical history of arthritis, congestive heart failure, CID, CDD, enlarged prostate, HTN, HLD, left inguinal hernia, type 2 diabetes. 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    PAIN:  Are you having pain? No  Current: 0/10, At worst: 3/10  Pt denies reports of radiating pain, localizes to one area    PRECAUTIONS: Back   He can increase his weight to 25 pounds  RED FLAGS: None   WEIGHT BEARING RESTRICTIONS:  No lifting more than 25 pounds   FALLS:  Has patient fallen in last 6 months? No  LIVING ENVIRONMENT: Lives with: lives alone Lives in: House/apartment Stairs: Yes: External: 3 steps; on left going up Has following equipment at home: Single point cane and Walker - 2 wheeled, grab bars in shower   OCCUPATION: Engineer, drilling   PLOF: Independent  PATIENT GOALS: "Get back to walking without a walker or cane"   NEXT MD VISIT: 06/08/23 : prostate procedure   OBJECTIVE:  Note: Objective measures were completed at Evaluation unless otherwise noted.  DIAGNOSTIC FINDINGS:  IMPRESSION from MRI 03/26/23 1. Severe spinal canal narrowing at L2-L3 secondary to a combination of ligamentum flavum hypertrophy, short pedicles, and a disc bulge. 2. Severe left and moderate right neural foraminal narrowing at L5-S1.   PATIENT SURVEYS:  FOTO 05/29/23: 38  Typical result : 51  SCREENING FOR RED  FLAGS: Bowel or bladder incontinence: No, Yes for bladder due to prostate Spinal tumors: No Cauda equina syndrome: No Compression fracture: No Abdominal aneurysm: No  COGNITION: Overall cognitive status: Within functional limits for tasks assessed     SENSATION: Light touch: WFL Coordination intact   MUSCLE LENGTH: Hamstrings: Right WNL deg; Left 56 deg   POSTURE: rounded shoulders and increased thoracic kyphosis  PALPATION: Tightness of bilateral lumbar paraspinals and quadratus lumborum  Incision neat, healing well.   LUMBAR ROM:   Not tested due to precautions  AROM eval  Flexion   Extension   Right lateral flexion   Left lateral flexion   Right rotation   Left rotation    (Blank rows = not tested)   LOWER EXTREMITY MMT:    MMT Right eval Left eval  Hip flexion 4-/5 4-/5  Hip extension    Hip abduction 4 4  Hip adduction 4- 4  Hip internal rotation    Hip external rotation    Knee flexion 4-/5 4-/5  Knee extension 4+ 4+  Ankle dorsiflexion 4/5 4/5  Ankle plantarflexion 4-/5 4-/5  Ankle inversion    Ankle eversion     (Blank rows = not tested)  LUMBAR SPECIAL TESTS:  Deferred due to precautions Hip special tests: FABER and Scour negative SLR : negative   FUNCTIONAL TESTS:  5 times sit to stand: 16.59s intermittent hand use 10 meter walk test: 20.97  .60m/s with walker   GAIT: Distance walked: 53ft  Assistive device utilized: Environmental consultant - 2 wheeled Level of assistance: CGA Comments: External rotation at the hips, kyphotic posture, poor foot clearance bilateral with L> R. Decreased heel strike with foot flat noted bilaterally  TODAY'S TREATMENT: DATE: 06/18/23   TherEx   NuStep L1 for 2 minutes, L4 for 2.5 minutes, L2 for , cues for full ROM and consistent SPM  through varied resistance.   Standing therex:  Hip flexion 4#AW x 12  Side stepping in parallel bars 4# AW x 4 laps.  Hip extension 4#AW x12  Standing HS curl 4# AW x 12   Standing step over hurdle x 10 bil  Foot tap on 1 of 2 colors x 10 bil  Hip abduction 4#AW   Seated therex LAQ 4# AW x 12 bil limited ROM for last 3 reps  Ankle DF x 20 4# AW Ankle PF x20 4# AW  Multiple therapeutic rest breaks throughout session due to BLE weakness. No pain reported throughout session.     PATIENT EDUCATION:  Education details: HEP, POC, goals  Person educated: Patient Education method: Explanation, Demonstration, and Handouts Education comprehension: verbalized understanding and returned demonstration  HOME EXERCISE PROGRAM: Access Code: [email or web address] URL: [email or web address] Date: 05/29/2023 Prepared by: Precious Bard  Exercises - Standing March with Counter Support  - 1 x daily - 7 x weekly - 2 sets - 10 reps - 5 hold - Heel Raises with Counter Support  - 1 x daily - 7 x weekly - 2 sets - 10 reps - 5 hold - Sit to Stand Without Arm Support  - 1 x daily - 7 x weekly - 2 sets - 10 reps - 5 hold  ASSESSMENT:  CLINICAL IMPRESSION:  Pt put forth great effort throughout the session and was able to perform exercises without complications. Noted to have difficulty with isolated hip extension and ankle DF. Fatigue limited increased repetitions in BLE and multiple therapeutic rest breaks throughout session. No pain reported throughout session.   Pt will continue to benefit from skilled therapy to address remaining deficits in order to improve overall QoL and return to PLOF.     OBJECTIVE IMPAIRMENTS: Abnormal gait, decreased activity tolerance, decreased balance, decreased endurance, decreased mobility, difficulty walking, decreased strength, impaired flexibility, and improper body mechanics.   ACTIVITY LIMITATIONS: carrying, lifting, bending, standing, squatting, stairs, transfers, bed mobility, dressing, and locomotion level  PARTICIPATION LIMITATIONS: meal prep, cleaning, laundry, driving, shopping, occupation, and yard work  PERSONAL FACTORS:  arthritis, congestive heart failure, CID, CDD, enlarged prostate, HTN, HLD, left inguinal hernia, type 2 diabetes are also affecting patient's functional outcome.   REHAB POTENTIAL: Good  CLINICAL DECISION MAKING: Evolving/moderate complexity  EVALUATION COMPLEXITY: Moderate   GOALS: Goals reviewed with patient? Yes  SHORT TERM GOALS: Target date: 06/26/2023  Patient will be independent in home exercise program to improve strength/mobility for better functional independence with ADLs. Baseline: 05/29/23: HEP given  Goal status: INITIAL   LONG TERM GOALS: Target date: 07/24/23  Patient will increase 10 meter walk test to >1.31m/s as to improve gait speed for better community ambulation and to reduce fall risk. Baseline: .47 m/s Goal status: INITIAL  2.  Patient will increase FOTO score to equal to or greater than 51 to demonstrate statistically significant improvement in mobility and quality of life. Baseline: 05/29/23: 38 Goal status: INITIAL  3.  Patient (> 66 years old) will complete five times sit to stand test in < 15 seconds indicating an increased LE strength and improved balance. Baseline: 16.59 Goal status: INITIAL  4. Patient will ambulate 150 ft  without the use of an assistive device in order to increase independence and improve quality of life.  Baseline: Not tested  Goal status: INITIAL  5.  Patient will increase Berg Balance score by > 6 points to demonstrate decreased fall risk during functional activities. Baseline: awaiting testing until precautions  are lifted  Goal status: INITIAL     PLAN:  PT FREQUENCY: 1-2x/week  PT DURATION: 8 weeks  PLANNED INTERVENTIONS: Therapeutic exercises, Therapeutic activity, Neuromuscular re-education, Balance training, Gait training, Patient/Family education, Self Care, Joint mobilization, Stair training, Vestibular training, Canalith repositioning, DME instructions, Dry Needling, Electrical stimulation, Cryotherapy,  Moist heat, scar mobilization, Taping, Manual therapy, and Re-evaluation.  PLAN FOR NEXT SESSION:   Coordination, balance and BLE strengthening.  Functional gait training.    Grier Rocher PT, DPT  Physical Therapist - [facility]  10:20 AM 06/18/23

## 2023-06-20 ENCOUNTER — Ambulatory Visit: Payer: 59 | Admitting: Physical Therapy

## 2023-06-20 DIAGNOSIS — M6281 Muscle weakness (generalized): Secondary | ICD-10-CM

## 2023-06-20 DIAGNOSIS — R262 Difficulty in walking, not elsewhere classified: Secondary | ICD-10-CM

## 2023-06-20 DIAGNOSIS — R2681 Unsteadiness on feet: Secondary | ICD-10-CM

## 2023-06-20 DIAGNOSIS — R278 Other lack of coordination: Secondary | ICD-10-CM

## 2023-06-20 NOTE — Therapy (Signed)
OUTPATIENT PHYSICAL THERAPY THORACOLUMBAR and NEURO TREATMENT   Patient Name: Lawrence Holmes MRN: [id number] DOB:[birth year], 67 y.o., male Today's Date: 06/20/2023  END OF SESSION:  PT End of Session - 06/20/23 1103     Visit Number 7    Number of Visits 16    Date for PT Re-Evaluation 07/24/23    Progress Note Due on Visit 10    PT Start Time 1105    PT Stop Time 1145    PT Time Calculation (min) 40 min    Equipment Utilized During Treatment Gait belt    Activity Tolerance Patient tolerated treatment well;No increased pain    Behavior During Therapy [facility] for tasks assessed/performed              Past Medical History:  Diagnosis Date   Aortic atherosclerosis (HCC)    Arthritis    BPH with urinary obstruction    Chronic systolic heart failure (HCC) 07/23/2017   a.) TTE 07/23/2017: EF 30-35%, mild LVH, inferolateral and inferior AK, mild BAE, G1DD; b.) TTE 10/23/2017: EF 40-45%, mild LVH, basal-mid inferior and inferoseptal AK, mild MR; c.) TTE 11/19/2021: EF 45-50%, glob HK with mod to severe inferior and basal to mid septal HK, GLS -15.3%, mild MR, G1DD.   Coronary artery disease 07/21/2017   a. 07/2017 Late presenting inferior STEMI/Cath: LM nl, LAD min irregs, LCX 100d (L->L collats), RCA 100p (L->R collats), EF 25-35%-->Med Rx; b. 10/2017 ETT (DOT): Ex time 7:38, baseline antlat TWI, no acute changes; c. 09/2019 ETT: Ex time 6:49. No ECG changes.   DDD (degenerative disc disease), lumbar    a.) s/p MIS L2-3 laminectomy and disectomy   Diverticulosis of colon    Erectile dysfunction    HTN (hypertension)    Hx of bilateral cataract extraction    Hyperlipidemia    Ischemic cardiomyopathy    a.) LHC 07/21/2017: EF 25-35%; b.) TTE 07/23/2017: 30-35%; c.) TTE 10/23/2017: EF 40-45%; d.) TTE 11/19/2021: EF 45-50%   Left inguinal hernia    a.) s/p repair 05/19/2022   ST elevation myocardial infarction (STEMI) of inferior wall (HCC) 07/21/2017   a.) LHC 07/21/2017: EF 25-35%,  LVEDP 14 mmHg. 40% pLCx, 40% mLCx, 100% dLCx, 100% pRCA --> attempted to cross wire at RCA lesion, however unable. Reasonable RCA collaterals already formed --> med mgmt.   T2DM (type 2 diabetes mellitus) (HCC)    Tobacco use    Past Surgical History:  Procedure Laterality Date   CATARACT EXTRACTION Bilateral    COLONOSCOPY WITH PROPOFOL N/A 04/04/2019   Procedure: COLONOSCOPY WITH PROPOFOL;  Surgeon: Pasty Spillers, MD;  Location: [facility] ENDOSCOPY;  Service: Endoscopy;  Laterality: N/A;   CYSTOSCOPY WITH FULGERATION N/A 05/14/2023   Procedure: CYSTOSCOPY WITH CLOT EVACUATION AND FULGERATION;  Surgeon: Vanna Scotland, MD;  Location: [facility] ORS;  Service: Urology;  Laterality: N/A;   HOLEP-LASER ENUCLEATION OF THE PROSTATE WITH MORCELLATION N/A 06/08/2023   Procedure: HOLEP-LASER ENUCLEATION OF THE PROSTATE WITH MORCELLATION;  Surgeon: Sondra Come, MD;  Location: [facility] ORS;  Service: Urology;  Laterality: N/A;   LEFT HEART CATH AND CORONARY ANGIOGRAPHY N/A 07/21/2017   Procedure: LEFT HEART CATH AND CORONARY ANGIOGRAPHY;  Surgeon: Iran Ouch, MD;  Location: [facility] INVASIVE CV [facility];  Service: Cardiovascular;  Laterality: N/A;   LUMBAR LAMINECTOMY/DECOMPRESSION MICRODISCECTOMY Bilateral 04/05/2023   Procedure: MINIMALLY INVASIVE (MIS) L2-3 LAMINECTOMY AND DISCECTOMY;  Surgeon: Loreen Freud, MD;  Location: [facility] ORS;  Service: Neurosurgery;  Laterality: Bilateral;   XI ROBOTIC  ASSISTED INGUINAL HERNIA REPAIR WITH MESH Left 05/19/2022   Procedure: XI ROBOTIC ASSISTED INGUINAL HERNIA REPAIR WITH MESH;  Surgeon: Campbell Lerner, MD;  Location: [facility] ORS;  Service: General;  Laterality: Left;   Patient Active Problem List   Diagnosis Date Noted   S/P lumbar microdiscectomy 05/16/2023   Hematuria 05/12/2023   Acute urinary retention 05/12/2023   Bilateral hydronephrosis 05/12/2023   HTN (hypertension) 05/12/2023   HLD (hyperlipidemia) 05/12/2023   Type II diabetes mellitus with  renal manifestations (HCC) 05/12/2023   Chronic kidney disease, stage 3a (HCC) 05/12/2023   Chronic systolic CHF (congestive heart failure) (HCC) 05/12/2023   BPH (benign prostatic hyperplasia) 05/12/2023   UTI (urinary tract infection) 05/12/2023   CAD (coronary artery disease) 05/12/2023   Intervertebral lumbar disc disorder with myelopathy, lumbar region 04/02/2023   Impaired functional mobility, balance, gait, and endurance 03/26/2023   Bilateral leg weakness 03/26/2023   Abnormal prostate exam 05/09/2022   Aortic atherosclerosis (HCC) 02/24/2022   Smokes with greater than 30 pack year history 11/18/2021   Coronary artery disease of native artery of native heart with stable angina pectoris (HCC) 05/19/2021   Dyslipidemia 07/30/2020   Polyp of colon    Type 2 diabetes mellitus, controlled (HCC) 02/08/2018   History of myocardial infarction 11/08/2017   Tobacco use 11/08/2017   Ischemic cardiomyopathy 11/08/2017   CHF (congestive heart failure) (HCC) 11/08/2017   Foot callus 11/08/2017   Onychomycosis of multiple toenails with type 2 diabetes mellitus (HCC) 11/08/2017   Cataract, nuclear sclerotic, left eye 04/04/2016    PCP: Danelle Berry, PA-C  REFERRING PROVIDER: Oswaldo Conroy Mecum, PA-C  REFERRING DIAG:  M51.06 (ICD-10-CM) - Intervertebral lumbar disc disorder with myelopathy, lumbar region  Z74.09 (ICD-10-CM) - Impaired functional mobility, balance, gait, and endurance  R29.898 (ICD-10-CM) - Bilateral leg weakness  Z98.890 (ICD-10-CM) - S/P lumbar microdiscectomy    Rationale for Evaluation and Treatment: Rehabilitation  THERAPY DIAG:  Muscle weakness (generalized)  Other lack of coordination  Difficulty in walking, not elsewhere classified  Unsteadiness on feet  ONSET DATE: 03/2023  SUBJECTIVE:                                                                                                                                                                                            SUBJECTIVE STATEMENT:  Pt reports reports that he has not been moving as much the last couple days, and his right LE is "shakier" than is has been. Also reports mild numbness in the R thigh the last 2 days.   Educated by PT to notify MD if numbness progresses or worsens.   Pt  states that next Follow up with surgeon is 11/6     PERTINENT HISTORY:  Approximately 6 weeks status post Right L2-L3 Laminectomy and microdiscectomy (04/05/23).  He states that he has graduated from his wheelchair and is almost only using his walker, trying to graduate to his cane.  Patient has a past medical history of arthritis, congestive heart failure, CID, CDD, enlarged prostate, HTN, HLD, left inguinal hernia, type 2 diabetes. 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    PAIN:  Are you having pain? No  Current: 0/10, At worst: 3/10  Pt denies reports of radiating pain, localizes to one area    PRECAUTIONS: Back   He can increase his weight to 25 pounds  RED FLAGS: None   WEIGHT BEARING RESTRICTIONS:  No lifting more than 25 pounds   FALLS:  Has patient fallen in last 6 months? No  LIVING ENVIRONMENT: Lives with: lives alone Lives in: House/apartment Stairs: Yes: External: 3 steps; on left going up Has following equipment at home: Single point cane and Walker - 2 wheeled, grab bars in shower   OCCUPATION: Engineer, drilling   PLOF: Independent  PATIENT GOALS: "Get back to walking without a walker or cane"   NEXT MD VISIT: 06/08/23 : prostate procedure   OBJECTIVE:  Note: Objective measures were completed at Evaluation unless otherwise noted.  DIAGNOSTIC FINDINGS:  IMPRESSION from MRI 03/26/23 1. Severe spinal canal narrowing at L2-L3 secondary to a combination of ligamentum flavum hypertrophy, short pedicles, and a disc bulge. 2. Severe left and moderate right neural foraminal narrowing at L5-S1.    PATIENT SURVEYS:  FOTO 05/29/23: 38  Typical result : 51  SCREENING FOR RED FLAGS: Bowel or bladder incontinence: No, Yes for bladder due to prostate Spinal tumors: No Cauda equina syndrome: No Compression fracture: No Abdominal aneurysm: No  COGNITION: Overall cognitive status: Within functional limits for tasks assessed     SENSATION: Light touch: WFL Coordination intact   MUSCLE LENGTH: Hamstrings: Right WNL deg; Left 56 deg   POSTURE: rounded shoulders and increased thoracic kyphosis  PALPATION: Tightness of bilateral lumbar paraspinals and quadratus lumborum  Incision neat, healing well.   LUMBAR ROM:   Not tested due to precautions  AROM eval  Flexion   Extension   Right lateral flexion   Left lateral flexion   Right rotation   Left rotation    (Blank rows = not tested)   LOWER EXTREMITY MMT:    MMT Right eval Left eval  Hip flexion 4-/5 4-/5  Hip extension    Hip abduction 4 4  Hip adduction 4- 4  Hip internal rotation    Hip external rotation    Knee flexion 4-/5 4-/5  Knee extension 4+ 4+  Ankle dorsiflexion 4/5 4/5  Ankle plantarflexion 4-/5 4-/5  Ankle inversion    Ankle eversion     (Blank rows = not tested)  LUMBAR SPECIAL TESTS:  Deferred due to precautions Hip special tests: FABER and Scour negative SLR : negative   FUNCTIONAL TESTS:  5 times sit to stand: 16.59s intermittent hand use 10 meter walk test: 20.97  .36m/s with walker   GAIT: Distance walked: 33ft  Assistive device utilized: Environmental consultant - 2 wheeled Level of assistance: CGA Comments: External rotation at the hips, kyphotic posture, poor foot clearance bilateral with L> R. Decreased heel strike with foot flat noted bilaterally  TODAY'S TREATMENT: DATE: 06/20/23   TherEx  Without with SPC x 172ft with CGA-min assist from PT for safety with increased trunkal and RLE ataxia with fatigue for last 54ft.   Reciprocal foot tap on 6 inch step without resistance x 12  bil significant ataxia noted in stance on the RLE and with movement on the LLE. PT added 5# ankle weights performed addition foot tap on step, reduced ataxia noted.   Gait with 5# ankle weight x 90ft with SPC, improved coordination initially, but limited by fatigue for last 41ft due to hip weakness   Lateral foot tap on 6inch step x 10 bil Lateral step over 2 bolsters in floor x 8  bil with UE supported on rail at wall.   NuStep L1 for 2 minutes, L4 for 2.5 minutes, L2 for , cues for full ROM and consistent SPM through varied resistance.    Seated therex LAQ 5# AW x 10 bil limited ROM for last 3 reps  Reciprocal march x 15 bil with 5# ankle Weight  Isometric hip adduction x 15 with 3 sec hold   Multiple therapeutic rest breaks throughout session due to BLE weakness. No pain reported throughout session.     PATIENT EDUCATION:  Education details: HEP, POC, goals  Person educated: Patient Education method: Explanation, Demonstration, and Handouts Education comprehension: verbalized understanding and returned demonstration  HOME EXERCISE PROGRAM: Access Code: [email or web address] URL: [email or web address] Date: 05/29/2023 Prepared by: Precious Bard  Exercises - Standing March with Counter Support  - 1 x daily - 7 x weekly - 2 sets - 10 reps - 5 hold - Heel Raises with Counter Support  - 1 x daily - 7 x weekly - 2 sets - 10 reps - 5 hold - Sit to Stand Without Arm Support  - 1 x daily - 7 x weekly - 2 sets - 10 reps - 5 hold  ASSESSMENT:  CLINICAL IMPRESSION:  Pt put forth great effort throughout the session and was able to perform exercises without complications. Increased trunkal and BLE ataxia on this day compared to prior session. PT instructed pt in BLE therex with ankle weights, initially improved step length and reduced ataxia, but fatigue in hip flexors limit safety with prolonged gait. Continued to expand therex with increased weight as tolerated.  Pt will  continue to benefit from skilled therapy to address remaining deficits in order to improve overall QoL and return to PLOF.     OBJECTIVE IMPAIRMENTS: Abnormal gait, decreased activity tolerance, decreased balance, decreased endurance, decreased mobility, difficulty walking, decreased strength, impaired flexibility, and improper body mechanics.   ACTIVITY LIMITATIONS: carrying, lifting, bending, standing, squatting, stairs, transfers, bed mobility, dressing, and locomotion level  PARTICIPATION LIMITATIONS: meal prep, cleaning, laundry, driving, shopping, occupation, and yard work  PERSONAL FACTORS: arthritis, congestive heart failure, CID, CDD, enlarged prostate, HTN, HLD, left inguinal hernia, type 2 diabetes are also affecting patient's functional outcome.   REHAB POTENTIAL: Good  CLINICAL DECISION MAKING: Evolving/moderate complexity  EVALUATION COMPLEXITY: Moderate   GOALS: Goals reviewed with patient? Yes  SHORT TERM GOALS: Target date: 06/26/2023  Patient will be independent in home exercise program to improve strength/mobility for better functional independence with ADLs. Baseline: 05/29/23: HEP given  Goal status: INITIAL   LONG TERM GOALS: Target date: 07/24/23  Patient will increase 10 meter walk test to >1.56m/s as to improve gait speed for better community ambulation and to reduce fall risk. Baseline: .47 m/s Goal status: INITIAL  2.  Patient will increase FOTO score to equal to or greater than  51 to demonstrate statistically significant improvement in mobility and quality of life. Baseline: 05/29/23: 38 Goal status: INITIAL  3.  Patient (> 28 years old) will complete five times sit to stand test in < 15 seconds indicating an increased LE strength and improved balance. Baseline: 16.59 Goal status: INITIAL  4. Patient will ambulate 150 ft  without the use of an assistive device in order to increase independence and improve quality of life.  Baseline: Not tested   Goal status: INITIAL  5.  Patient will increase Berg Balance score by > 6 points to demonstrate decreased fall risk during functional activities. Baseline: awaiting testing until precautions are lifted  Goal status: INITIAL     PLAN:  PT FREQUENCY: 1-2x/week  PT DURATION: 8 weeks  PLANNED INTERVENTIONS: Therapeutic exercises, Therapeutic activity, Neuromuscular re-education, Balance training, Gait training, Patient/Family education, Self Care, Joint mobilization, Stair training, Vestibular training, Canalith repositioning, DME instructions, Dry Needling, Electrical stimulation, Cryotherapy, Moist heat, scar mobilization, Taping, Manual therapy, and Re-evaluation.  PLAN FOR NEXT SESSION:   Coordination, balance and BLE strengthening.  Functional gait training.    Grier Rocher PT, DPT  Physical Therapist - [facility]  11:04 AM 06/20/23

## 2023-06-23 NOTE — Progress Notes (Unsigned)
   REFERRING PHYSICIAN:  Danelle Berry, Pa-c [address] [place],  [place] [address]  DOS: 04/05/23  Right L2-L3 Laminectomy and microdiscectomy   HISTORY OF PRESENT ILLNESS:  He was improving at his last visit. He has been in PT for his back.   He has no back or leg pain. He has some numbness in toes on both feet. Has intermittent numbness in right leg.   He is using a cane to ambulate and he feels like he is getting stronger.    PHYSICAL EXAMINATION:  General: Patient is well developed, well nourished, calm, collected, and in no apparent distress.   NEUROLOGICAL:  General: In no acute distress.   Awake, alert, oriented to person, place, and time.  Pupils equal round and reactive to light.  Facial tone is symmetric.     Strength:           Side Iliopsoas Quads Hamstring PF DF EHL  R 5 5 5 5  4+ 4+  L 5 5 5  4+ 4+ 3   Incision well healed   ROS (Neurologic):  Negative except as noted above  IMAGING: Nothing new to review.   ASSESSMENT/PLAN:  Lawrence Holmes is doing well s/p above surgery. He is progressing well with PT. Treatment options reviewed with patient and following plan made:   - He will continue with PT. Has appointments through the end of the year.  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 Given note keeping him out.  - Follow up with Dr. Katrinka Blazing in 2 months for recheck and to discuss possibly returning to work.   Advised to contact the office if any questions or concerns arise.  Lawrence Leach PA-C Department of neurosurgery

## 2023-06-25 ENCOUNTER — Other Ambulatory Visit: Payer: Self-pay

## 2023-06-25 ENCOUNTER — Ambulatory Visit: Payer: 59 | Admitting: Physical Therapy

## 2023-06-25 DIAGNOSIS — E119 Type 2 diabetes mellitus without complications: Secondary | ICD-10-CM

## 2023-06-25 MED ORDER — EMPAGLIFLOZIN 10 MG PO TABS
10.0000 mg | ORAL_TABLET | Freq: Every day | ORAL | 1 refills | Status: DC
Start: 2023-06-25 — End: 2023-12-21

## 2023-06-25 NOTE — Telephone Encounter (Signed)
Pt states urology provider said he could restart back on all meds including Jardiance

## 2023-06-27 ENCOUNTER — Encounter: Payer: Self-pay | Admitting: Orthopedic Surgery

## 2023-06-27 ENCOUNTER — Ambulatory Visit: Payer: 59 | Attending: Physician Assistant

## 2023-06-27 ENCOUNTER — Ambulatory Visit (INDEPENDENT_AMBULATORY_CARE_PROVIDER_SITE_OTHER): Payer: 59 | Admitting: Orthopedic Surgery

## 2023-06-27 VITALS — BP 136/78 | Ht 72.0 in | Wt 200.0 lb

## 2023-06-27 DIAGNOSIS — M6281 Muscle weakness (generalized): Secondary | ICD-10-CM | POA: Insufficient documentation

## 2023-06-27 DIAGNOSIS — R2681 Unsteadiness on feet: Secondary | ICD-10-CM | POA: Insufficient documentation

## 2023-06-27 DIAGNOSIS — R278 Other lack of coordination: Secondary | ICD-10-CM | POA: Insufficient documentation

## 2023-06-27 DIAGNOSIS — R262 Difficulty in walking, not elsewhere classified: Secondary | ICD-10-CM | POA: Insufficient documentation

## 2023-06-27 DIAGNOSIS — M5106 Intervertebral disc disorders with myelopathy, lumbar region: Secondary | ICD-10-CM

## 2023-06-27 DIAGNOSIS — Z9889 Other specified postprocedural states: Secondary | ICD-10-CM

## 2023-06-27 NOTE — Therapy (Signed)
OUTPATIENT PHYSICAL THERAPY THORACOLUMBAR and NEURO TREATMENT   Patient Name: Lawrence Holmes MRN: [id number] DOB:November 11, 1955, 67 y.o., male Today's Date: 06/28/2023  END OF SESSION:  PT End of Session - 06/27/23 1014     Visit Number 8    Number of Visits 16    Date for PT Re-Evaluation 07/24/23    Progress Note Due on Visit 10    PT Start Time 1014    PT Stop Time 1059    PT Time Calculation (min) 45 min    Equipment Utilized During Treatment Gait belt    Activity Tolerance Patient tolerated treatment well;No increased pain    Behavior During Therapy [facility] for tasks assessed/performed              Past Medical History:  Diagnosis Date   Aortic atherosclerosis (HCC)    Arthritis    BPH with urinary obstruction    Chronic systolic heart failure (HCC) 07/23/2017   a.) TTE 07/23/2017: EF 30-35%, mild LVH, inferolateral and inferior AK, mild BAE, G1DD; b.) TTE 10/23/2017: EF 40-45%, mild LVH, basal-mid inferior and inferoseptal AK, mild MR; c.) TTE 11/19/2021: EF 45-50%, glob HK with mod to severe inferior and basal to mid septal HK, GLS -15.3%, mild MR, G1DD.   Coronary artery disease 07/21/2017   a. 07/2017 Late presenting inferior STEMI/Cath: LM nl, LAD min irregs, LCX 100d (L->L collats), RCA 100p (L->R collats), EF 25-35%-->Med Rx; b. 10/2017 ETT (DOT): Ex time 7:38, baseline antlat TWI, no acute changes; c. 09/2019 ETT: Ex time 6:49. No ECG changes.   DDD (degenerative disc disease), lumbar    a.) s/p MIS L2-3 laminectomy and disectomy   Diverticulosis of colon    Erectile dysfunction    HTN (hypertension)    Hx of bilateral cataract extraction    Hyperlipidemia    Ischemic cardiomyopathy    a.) LHC 07/21/2017: EF 25-35%; b.) TTE 07/23/2017: 30-35%; c.) TTE 10/23/2017: EF 40-45%; d.) TTE 11/19/2021: EF 45-50%   Left inguinal hernia    a.) s/p repair 05/19/2022   ST elevation myocardial infarction (STEMI) of inferior wall (HCC) 07/21/2017   a.) LHC 07/21/2017: EF 25-35%,  LVEDP 14 mmHg. 40% pLCx, 40% mLCx, 100% dLCx, 100% pRCA --> attempted to cross wire at RCA lesion, however unable. Reasonable RCA collaterals already formed --> med mgmt.   T2DM (type 2 diabetes mellitus) (HCC)    Tobacco use    Past Surgical History:  Procedure Laterality Date   CATARACT EXTRACTION Bilateral    COLONOSCOPY WITH PROPOFOL N/A 04/04/2019   Procedure: COLONOSCOPY WITH PROPOFOL;  Surgeon: Pasty Spillers, MD;  Location: [facility] ENDOSCOPY;  Service: Endoscopy;  Laterality: N/A;   CYSTOSCOPY WITH FULGERATION N/A 05/14/2023   Procedure: CYSTOSCOPY WITH CLOT EVACUATION AND FULGERATION;  Surgeon: Vanna Scotland, MD;  Location: [facility] ORS;  Service: Urology;  Laterality: N/A;   HOLEP-LASER ENUCLEATION OF THE PROSTATE WITH MORCELLATION N/A 06/08/2023   Procedure: HOLEP-LASER ENUCLEATION OF THE PROSTATE WITH MORCELLATION;  Surgeon: Sondra Come, MD;  Location: [facility] ORS;  Service: Urology;  Laterality: N/A;   LEFT HEART CATH AND CORONARY ANGIOGRAPHY N/A 07/21/2017   Procedure: LEFT HEART CATH AND CORONARY ANGIOGRAPHY;  Surgeon: Iran Ouch, MD;  Location: [facility] INVASIVE CV [facility];  Service: Cardiovascular;  Laterality: N/A;   LUMBAR LAMINECTOMY/DECOMPRESSION MICRODISCECTOMY Bilateral 04/05/2023   Procedure: MINIMALLY INVASIVE (MIS) L2-3 LAMINECTOMY AND DISCECTOMY;  Surgeon: Loreen Freud, MD;  Location: [facility] ORS;  Service: Neurosurgery;  Laterality: Bilateral;   XI ROBOTIC  ASSISTED INGUINAL HERNIA REPAIR WITH MESH Left 05/19/2022   Procedure: XI ROBOTIC ASSISTED INGUINAL HERNIA REPAIR WITH MESH;  Surgeon: Campbell Lerner, MD;  Location: [facility] ORS;  Service: General;  Laterality: Left;   Patient Active Problem List   Diagnosis Date Noted   S/P lumbar microdiscectomy 05/16/2023   Hematuria 05/12/2023   Acute urinary retention 05/12/2023   Bilateral hydronephrosis 05/12/2023   HTN (hypertension) 05/12/2023   HLD (hyperlipidemia) 05/12/2023   Type II diabetes mellitus with  renal manifestations (HCC) 05/12/2023   Chronic kidney disease, stage 3a (HCC) 05/12/2023   Chronic systolic CHF (congestive heart failure) (HCC) 05/12/2023   BPH (benign prostatic hyperplasia) 05/12/2023   UTI (urinary tract infection) 05/12/2023   CAD (coronary artery disease) 05/12/2023   Intervertebral lumbar disc disorder with myelopathy, lumbar region 04/02/2023   Impaired functional mobility, balance, gait, and endurance 03/26/2023   Bilateral leg weakness 03/26/2023   Abnormal prostate exam 05/09/2022   Aortic atherosclerosis (HCC) 02/24/2022   Smokes with greater than 30 pack year history 11/18/2021   Coronary artery disease of native artery of native heart with stable angina pectoris (HCC) 05/19/2021   Dyslipidemia 07/30/2020   Polyp of colon    Type 2 diabetes mellitus, controlled (HCC) 02/08/2018   History of myocardial infarction 11/08/2017   Tobacco use 11/08/2017   Ischemic cardiomyopathy 11/08/2017   CHF (congestive heart failure) (HCC) 11/08/2017   Foot callus 11/08/2017   Onychomycosis of multiple toenails with type 2 diabetes mellitus (HCC) 11/08/2017   Cataract, nuclear sclerotic, left eye 04/04/2016    PCP: Danelle Berry, PA-C  REFERRING PROVIDER: Oswaldo Conroy Mecum, PA-C  REFERRING DIAG:  M51.06 (ICD-10-CM) - Intervertebral lumbar disc disorder with myelopathy, lumbar region  Z74.09 (ICD-10-CM) - Impaired functional mobility, balance, gait, and endurance  R29.898 (ICD-10-CM) - Bilateral leg weakness  Z98.890 (ICD-10-CM) - S/P lumbar microdiscectomy    Rationale for Evaluation and Treatment: Rehabilitation  THERAPY DIAG:  Muscle weakness (generalized)  Other lack of coordination  Difficulty in walking, not elsewhere classified  Unsteadiness on feet  ONSET DATE: 03/2023  SUBJECTIVE:                                                                                                                                                                                            SUBJECTIVE STATEMENT:  Pt reports reports feeling better this week. States he can notice some improvements. Continues to want to work on his strength. States has follow up with MD later today.         PERTINENT HISTORY:  Approximately 6 weeks status post Right L2-L3 Laminectomy and microdiscectomy (04/05/23).  He  states that he has graduated from his wheelchair and is almost only using his walker, trying to graduate to his cane.  Patient has a past medical history of arthritis, congestive heart failure, CID, CDD, enlarged prostate, HTN, HLD, left inguinal hernia, type 2 diabetes. 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    PAIN:  Are you having pain? No  Current: 0/10, At worst: 3/10  Pt denies reports of radiating pain, localizes to one area    PRECAUTIONS: Back   He can increase his weight to 25 pounds  RED FLAGS: None   WEIGHT BEARING RESTRICTIONS:  No lifting more than 25 pounds   FALLS:  Has patient fallen in last 6 months? No  LIVING ENVIRONMENT: Lives with: lives alone Lives in: House/apartment Stairs: Yes: External: 3 steps; on left going up Has following equipment at home: Single point cane and Walker - 2 wheeled, grab bars in shower   OCCUPATION: Engineer, drilling   PLOF: Independent  PATIENT GOALS: "Get back to walking without a walker or cane"   NEXT MD VISIT: 06/08/23 : prostate procedure   OBJECTIVE:  Note: Objective measures were completed at Evaluation unless otherwise noted.  DIAGNOSTIC FINDINGS:  IMPRESSION from MRI 03/26/23 1. Severe spinal canal narrowing at L2-L3 secondary to a combination of ligamentum flavum hypertrophy, short pedicles, and a disc bulge. 2. Severe left and moderate right neural foraminal narrowing at L5-S1.   PATIENT SURVEYS:  FOTO 05/29/23: 38  Typical result : 51  SCREENING FOR RED FLAGS: Bowel or bladder incontinence: No, Yes  for bladder due to prostate Spinal tumors: No Cauda equina syndrome: No Compression fracture: No Abdominal aneurysm: No  COGNITION: Overall cognitive status: Within functional limits for tasks assessed     SENSATION: Light touch: WFL Coordination intact   MUSCLE LENGTH: Hamstrings: Right WNL deg; Left 56 deg   POSTURE: rounded shoulders and increased thoracic kyphosis  PALPATION: Tightness of bilateral lumbar paraspinals and quadratus lumborum  Incision neat, healing well.   LUMBAR ROM:   Not tested due to precautions  AROM eval  Flexion   Extension   Right lateral flexion   Left lateral flexion   Right rotation   Left rotation    (Blank rows = not tested)   LOWER EXTREMITY MMT:    MMT Right eval Left eval  Hip flexion 4-/5 4-/5  Hip extension    Hip abduction 4 4  Hip adduction 4- 4  Hip internal rotation    Hip external rotation    Knee flexion 4-/5 4-/5  Knee extension 4+ 4+  Ankle dorsiflexion 4/5 4/5  Ankle plantarflexion 4-/5 4-/5  Ankle inversion    Ankle eversion     (Blank rows = not tested)  LUMBAR SPECIAL TESTS:  Deferred due to precautions Hip special tests: FABER and Scour negative SLR : negative   FUNCTIONAL TESTS:  5 times sit to stand: 16.59s intermittent hand use 10 meter walk test: 20.97  .71m/s with walker   GAIT: Distance walked: 79ft  Assistive device utilized: Environmental consultant - 2 wheeled Level of assistance: CGA Comments: External rotation at the hips, kyphotic posture, poor foot clearance bilateral with L> R. Decreased heel strike with foot flat noted bilaterally  TODAY'S TREATMENT: DATE: 06/28/23   TherEx   Step ups with 5# AW alt LE x 12 reps   alternating foot tap on 6 inch step with 5# resistance x 15 reps - minimal initially ataxia  yet improved with   Sit to stand with arms crossed 2 sets of 10 reps- Improved cadence and power with reps- slow start- No pain reported  Gait with 5# ankle weight x 175 ft with SPC -  decreased step length yet no LOB today.   Lateral foot tap on 6inch step with 5# AW 2sets x 10 each LE   Seated therex LAQ 5# AW x 10 alt LE with 3 sec hold- (difficulty achieving Full-ROM)    NMR:   Activity Description: Tapping on appropriate blaze pod (3) lined up approx 12 in apart-  Activity Setting:  The Blaze Pod Random setting was chosen to enhance cognitive processing and agility, providing an unpredictable environment to simulate real-world scenarios, and fostering quick reactions and adaptability.   Number of Pods:  3 Cycles/Sets:  5 Duration (Time or Hit Count):  Time-30 sec  Patient Stats  Hits:   19, 22, 23, 23, 24    Multiple therapeutic rest breaks throughout session due to BLE weakness. No pain reported throughout session.     PATIENT EDUCATION:  Education details: HEP, POC, goals  Person educated: Patient Education method: Explanation, Demonstration, and Handouts Education comprehension: verbalized understanding and returned demonstration  HOME EXERCISE PROGRAM: Access Code: [email or web address] URL: [email or web address] Date: 05/29/2023 Prepared by: Precious Bard  Exercises - Standing March with Counter Support  - 1 x daily - 7 x weekly - 2 sets - 10 reps - 5 hold - Heel Raises with Counter Support  - 1 x daily - 7 x weekly - 2 sets - 10 reps - 5 hold - Sit to Stand Without Arm Support  - 1 x daily - 7 x weekly - 2 sets - 10 reps - 5 hold  ASSESSMENT:  CLINICAL IMPRESSION: Patient presents with good motivation for today's session. He exhibited less overall ataxia - walking with resistance today and improved reaction time with practice today. He continues to exhibit some fatigue and overall LE muscle weakness yet able to complete all activities with 5# AW today.  Pt will continue to benefit from skilled therapy to address remaining deficits in order to improve overall QoL and return to PLOF.     OBJECTIVE IMPAIRMENTS: Abnormal gait, decreased  activity tolerance, decreased balance, decreased endurance, decreased mobility, difficulty walking, decreased strength, impaired flexibility, and improper body mechanics.   ACTIVITY LIMITATIONS: carrying, lifting, bending, standing, squatting, stairs, transfers, bed mobility, dressing, and locomotion level  PARTICIPATION LIMITATIONS: meal prep, cleaning, laundry, driving, shopping, occupation, and yard work  PERSONAL FACTORS: arthritis, congestive heart failure, CID, CDD, enlarged prostate, HTN, HLD, left inguinal hernia, type 2 diabetes are also affecting patient's functional outcome.   REHAB POTENTIAL: Good  CLINICAL DECISION MAKING: Evolving/moderate complexity  EVALUATION COMPLEXITY: Moderate   GOALS: Goals reviewed with patient? Yes  SHORT TERM GOALS: Target date: 06/26/2023  Patient will be independent in home exercise program to improve strength/mobility for better functional independence with ADLs. Baseline: 05/29/23: HEP given  Goal status: INITIAL   LONG TERM GOALS: Target date: 07/24/23  Patient will increase 10 meter walk test to >1.52m/s as to improve gait speed for better community ambulation and to reduce fall risk. Baseline: .47 m/s Goal status: INITIAL  2.  Patient will increase FOTO score to equal to or greater than 51 to demonstrate statistically significant improvement in mobility and quality of life. Baseline: 05/29/23: 38 Goal status: INITIAL  3.  Patient (> 81 years old) will complete five times sit to stand test in <  15 seconds indicating an increased LE strength and improved balance. Baseline: 16.59 Goal status: INITIAL  4. Patient will ambulate 150 ft  without the use of an assistive device in order to increase independence and improve quality of life.  Baseline: Not tested  Goal status: INITIAL  5.  Patient will increase Berg Balance score by > 6 points to demonstrate decreased fall risk during functional activities. Baseline: awaiting testing  until precautions are lifted  Goal status: INITIAL     PLAN:  PT FREQUENCY: 1-2x/week  PT DURATION: 8 weeks  PLANNED INTERVENTIONS: Therapeutic exercises, Therapeutic activity, Neuromuscular re-education, Balance training, Gait training, Patient/Family education, Self Care, Joint mobilization, Stair training, Vestibular training, Canalith repositioning, DME instructions, Dry Needling, Electrical stimulation, Cryotherapy, Moist heat, scar mobilization, Taping, Manual therapy, and Re-evaluation.  PLAN FOR NEXT SESSION:   Coordination, balance and BLE strengthening.  Functional gait training.    Louis Meckel, PT Physical Therapist - [facility]  8:18 AM 06/28/23

## 2023-06-29 ENCOUNTER — Telehealth: Payer: Self-pay | Admitting: Urology

## 2023-06-29 NOTE — Telephone Encounter (Signed)
Pt called to let us know he had a little blood in use urine small clots, but since is urinating fine and clear.  I told him to call office if this continues.

## 2023-07-02 ENCOUNTER — Ambulatory Visit: Payer: 59 | Admitting: Physical Therapy

## 2023-07-03 ENCOUNTER — Ambulatory Visit: Payer: 59

## 2023-07-05 ENCOUNTER — Ambulatory Visit: Payer: 59 | Admitting: Physical Therapy

## 2023-07-05 DIAGNOSIS — M6281 Muscle weakness (generalized): Secondary | ICD-10-CM | POA: Diagnosis not present

## 2023-07-05 DIAGNOSIS — R2681 Unsteadiness on feet: Secondary | ICD-10-CM

## 2023-07-05 DIAGNOSIS — R278 Other lack of coordination: Secondary | ICD-10-CM

## 2023-07-05 DIAGNOSIS — R262 Difficulty in walking, not elsewhere classified: Secondary | ICD-10-CM

## 2023-07-05 NOTE — Therapy (Signed)
OUTPATIENT PHYSICAL THERAPY THORACOLUMBAR and NEURO TREATMENT   Patient Name: Lawrence Holmes MRN: [id number] DOB:1956-06-24, 67 y.o., male Today's Date: 07/05/2023  END OF SESSION:  PT End of Session - 07/05/23 1442     Visit Number 9    Number of Visits 16    Date for PT Re-Evaluation 07/24/23    Progress Note Due on Visit 10    PT Start Time 1445    PT Stop Time 1525    PT Time Calculation (min) 40 min    Equipment Utilized During Treatment Gait belt    Activity Tolerance Patient tolerated treatment well;No increased pain    Behavior During Therapy [facility] for tasks assessed/performed              Past Medical History:  Diagnosis Date   Aortic atherosclerosis (HCC)    Arthritis    BPH with urinary obstruction    Chronic systolic heart failure (HCC) 07/23/2017   a.) TTE 07/23/2017: EF 30-35%, mild LVH, inferolateral and inferior AK, mild BAE, G1DD; b.) TTE 10/23/2017: EF 40-45%, mild LVH, basal-mid inferior and inferoseptal AK, mild MR; c.) TTE 11/19/2021: EF 45-50%, glob HK with mod to severe inferior and basal to mid septal HK, GLS -15.3%, mild MR, G1DD.   Coronary artery disease 07/21/2017   a. 07/2017 Late presenting inferior STEMI/Cath: LM nl, LAD min irregs, LCX 100d (L->L collats), RCA 100p (L->R collats), EF 25-35%-->Med Rx; b. 10/2017 ETT (DOT): Ex time 7:38, baseline antlat TWI, no acute changes; c. 09/2019 ETT: Ex time 6:49. No ECG changes.   DDD (degenerative disc disease), lumbar    a.) s/p MIS L2-3 laminectomy and disectomy   Diverticulosis of colon    Erectile dysfunction    HTN (hypertension)    Hx of bilateral cataract extraction    Hyperlipidemia    Ischemic cardiomyopathy    a.) LHC 07/21/2017: EF 25-35%; b.) TTE 07/23/2017: 30-35%; c.) TTE 10/23/2017: EF 40-45%; d.) TTE 11/19/2021: EF 45-50%   Left inguinal hernia    a.) s/p repair 05/19/2022   ST elevation myocardial infarction (STEMI) of inferior wall (HCC) 07/21/2017   a.) LHC 07/21/2017: EF 25-35%,  LVEDP 14 mmHg. 40% pLCx, 40% mLCx, 100% dLCx, 100% pRCA --> attempted to cross wire at RCA lesion, however unable. Reasonable RCA collaterals already formed --> med mgmt.   T2DM (type 2 diabetes mellitus) (HCC)    Tobacco use    Past Surgical History:  Procedure Laterality Date   CATARACT EXTRACTION Bilateral    COLONOSCOPY WITH PROPOFOL N/A 04/04/2019   Procedure: COLONOSCOPY WITH PROPOFOL;  Surgeon: Pasty Spillers, MD;  Location: [facility] END[facility];  Service: Endoscopy;  Laterality: N/A;   CYSTOSCOPY WITH FULGERATION N/A 05/14/2023   Procedure: CYSTOSCOPY WITH CLOT EVACUATION AND FULGERATION;  Surgeon: Vanna Scotland, MD;  Location: [facility] OR[facility];  Service: Urology;  Laterality: N/A;   HOLEP-LASER ENUCLEATION OF THE PROSTATE WITH MORCELLATION N/A 06/08/2023   Procedure: HOLEP-LASER ENUCLEATION OF THE PROSTATE WITH MORCELLATION;  Surgeon: Sondra Come, MD;  Location: [facility] ORS;  Service: Urology;  Laterality: N/A;   LEFT HEART CATH AND CORONARY ANGIOGRAPHY N/A 07/21/2017   Procedure: LEFT HEART CATH AND CORONARY ANGIOGRAPHY;  Surgeon: Iran Ouch, MD;  Location: [facility] IN[facility];  Service: Cardiovascular;  Laterality: N/A;   LUMBAR LAMINECTOMY/DECOMPRESSION MICRODISCECTOMY Bilateral 04/05/2023   Procedure: MINIMALLY INVASIVE (MIS) L2-3 LAMINECTOMY AND DISCECTOMY;  Surgeon: Loreen Freud, MD;  Location: [facility] OR[facility];  Service: Neurosurgery;  Laterality: Bilateral;   XI ROBOTIC  ASSISTED INGUINAL HERNIA REPAIR WITH MESH Left 05/19/2022   Procedure: XI ROBOTIC ASSISTED INGUINAL HERNIA REPAIR WITH MESH;  Surgeon: Campbell Lerner, MD;  Location: [facility] OR[facility];  Service: General;  Laterality: Left;   Patient Active Problem List   Diagnosis Date Noted   S/P lumbar microdiscectomy 05/16/2023   Hematuria 05/12/2023   Acute urinary retention 05/12/2023   Bilateral hydronephrosis 05/12/2023   HTN (hypertension) 05/12/2023   HLD (hyperlipidemia) 05/12/2023   Type II diabetes mellitus with  renal manifestations (HCC) 05/12/2023   Chronic kidney disease, stage 3a (HCC) 05/12/2023   Chronic systolic CHF (congestive heart failure) (HCC) 05/12/2023   BPH (benign prostatic hyperplasia) 05/12/2023   UTI (urinary tract infection) 05/12/2023   CAD (coronary artery disease) 05/12/2023   Intervertebral lumbar disc disorder with myelopathy, lumbar region 04/02/2023   Impaired functional mobility, balance, gait, and endurance 03/26/2023   Bilateral leg weakness 03/26/2023   Abnormal prostate exam 05/09/2022   Aortic atherosclerosis (HCC) 02/24/2022   Smokes with greater than 30 pack year history 11/18/2021   Coronary artery disease of native artery of native heart with stable angina pectoris (HCC) 05/19/2021   Dyslipidemia 07/30/2020   Polyp of colon    Type 2 diabetes mellitus, controlled (HCC) 02/08/2018   History of myocardial infarction 11/08/2017   Tobacco use 11/08/2017   Ischemic cardiomyopathy 11/08/2017   CHF (congestive heart failure) (HCC) 11/08/2017   Foot callus 11/08/2017   Onychomycosis of multiple toenails with type 2 diabetes mellitus (HCC) 11/08/2017   Cataract, nuclear sclerotic, left eye 04/04/2016    PCP: Danelle Berry, PA-C  REFERRING PROVIDER: Oswaldo Conroy Mecum, PA-C  REFERRING DIAG:  M51.06 (ICD-10-CM) - Intervertebral lumbar disc disorder with myelopathy, lumbar region  Z74.09 (ICD-10-CM) - Impaired functional mobility, balance, gait, and endurance  R29.898 (ICD-10-CM) - Bilateral leg weakness  Z98.890 (ICD-10-CM) - S/P lumbar microdiscectomy    Rationale for Evaluation and Treatment: Rehabilitation  THERAPY DIAG:  Other lack of coordination  Difficulty in walking, not elsewhere classified  Unsteadiness on feet  Muscle weakness (generalized)  ONSET DATE: 03/2023  SUBJECTIVE:                                                                                                                                                                                            SUBJECTIVE STATEMENT:  Pt reports reports that he is doing well. Saw MD last week. No medical updates or medicine changes following MD appointment.  No falls since last PT treatment. No pain.      PERTINENT HISTORY:  Approximately 6 weeks status post Right L2-L3 Laminectomy and microdiscectomy (04/05/23).  He states that  he has graduated from his wheelchair and is almost only using his walker, trying to graduate to his cane.  Patient has a past medical history of arthritis, congestive heart failure, CID, CDD, enlarged prostate, HTN, HLD, left inguinal hernia, type 2 diabetes. 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    PAIN:  Are you having pain? No  Current: 0/10, At worst: 3/10  Pt denies reports of radiating pain, localizes to one area    PRECAUTIONS: Back   He can increase his weight to 25 pounds  RED FLAGS: None   WEIGHT BEARING RESTRICTIONS:  No lifting more than 25 pounds   FALLS:  Has patient fallen in last 6 months? No  LIVING ENVIRONMENT: Lives with: lives alone Lives in: House/apartment Stairs: Yes: External: 3 steps; on left going up Has following equipment at home: Single point cane and Walker - 2 wheeled, grab bars in shower   OCCUPATION: Engineer, drilling   PLOF: Independent  PATIENT GOALS: "Get back to walking without a walker or cane"   NEXT MD VISIT: 06/08/23 : prostate procedure   OBJECTIVE:  Note: Objective measures were completed at Evaluation unless otherwise noted.  DIAGNOSTIC FINDINGS:  IMPRESSION from MRI 03/26/23 1. Severe spinal canal narrowing at L2-L3 secondary to a combination of ligamentum flavum hypertrophy, short pedicles, and a disc bulge. 2. Severe left and moderate right neural foraminal narrowing at L5-S1.   PATIENT SURVEYS:  FOTO 05/29/23: 38  Typical result : 51  SCREENING FOR RED FLAGS: Bowel or bladder incontinence: No, Yes for  bladder due to prostate Spinal tumors: No Cauda equina syndrome: No Compression fracture: No Abdominal aneurysm: No  COGNITION: Overall cognitive status: Within functional limits for tasks assessed     SENSATION: Light touch: WFL Coordination intact   MUSCLE LENGTH: Hamstrings: Right WNL deg; Left 56 deg   POSTURE: rounded shoulders and increased thoracic kyphosis  PALPATION: Tightness of bilateral lumbar paraspinals and quadratus lumborum  Incision neat, healing well.   LUMBAR ROM:   Not tested due to precautions  AROM eval  Flexion   Extension   Right lateral flexion   Left lateral flexion   Right rotation   Left rotation    (Blank rows = not tested)   LOWER EXTREMITY MMT:    MMT Right eval Left eval  Hip flexion 4-/5 4-/5  Hip extension    Hip abduction 4 4  Hip adduction 4- 4  Hip internal rotation    Hip external rotation    Knee flexion 4-/5 4-/5  Knee extension 4+ 4+  Ankle dorsiflexion 4/5 4/5  Ankle plantarflexion 4-/5 4-/5  Ankle inversion    Ankle eversion     (Blank rows = not tested)  LUMBAR SPECIAL TESTS:  Deferred due to precautions Hip special tests: FABER and Scour negative SLR : negative   FUNCTIONAL TESTS:  5 times sit to stand: 16.59s intermittent hand use 10 meter walk test: 20.97  .25m/s with walker   GAIT: Distance walked: 52ft  Assistive device utilized: Environmental consultant - 2 wheeled Level of assistance: CGA Comments: External rotation at the hips, kyphotic posture, poor foot clearance bilateral with L> R. Decreased heel strike with foot flat noted bilaterally  TODAY'S TREATMENT: DATE: 07/05/23   TherEx   Gait with SPC x 17ft and CGA. Noted to have mild foot drag and ataxia in turns, but significantly improved from last treatment with this PT.  Additional gait with SPC  x 170ft with CGA and noted improvement in step   Forward/reverse gait with SPC 81ft x 4. Noted to have improved reciprocal movement pattern with increased  repetitions and decreased BLE ataxia with each bout.  Side stepping with SPC 63ft x 3 bil. 1 mild LOB, but able to correct with stepping strategy of the LLE and no additional assist.   Weave through 10 cones x 2 with SPC and  10x2 without AD min a assist provided without AD to improve posture.   Lateral foot tap on hedge hog x 10 with UE support and x 10 without UE support   Reciprocal march x 12 bil with UE supported on rail. Hold at end range x 3 sec.  Hip extension with 2 sec hold x 12 bil    Throughout session, PT provided CGA for safety with tactile cues for weight shifting to allow adequate step height and reduce foot drag.   Multiple therapeutic rest breaks throughout session due to BLE weakness. No pain reported throughout session.     PATIENT EDUCATION:  Education details: HEP, POC, goals  Person educated: Patient Education method: Explanation, Demonstration, and Handouts Education comprehension: verbalized understanding and returned demonstration  HOME EXERCISE PROGRAM: Access Code: [email or web address] URL: [email or web address] Date: 05/29/2023 Prepared by: Precious Bard  Exercises - Standing March with Counter Support  - 1 x daily - 7 x weekly - 2 sets - 10 reps - 5 hold - Heel Raises with Counter Support  - 1 x daily - 7 x weekly - 2 sets - 10 reps - 5 hold - Sit to Stand Without Arm Support  - 1 x daily - 7 x weekly - 2 sets - 10 reps - 5 hold  ASSESSMENT:  CLINICAL IMPRESSION: Patient presents with good motivation for today's session. Continues to demonstrate improved ataxia, but still present with turns and retroversion. Treatment focused on dynamic movement patterns with and without UE support on SPC. Pt noted to have increased trunkal ataxia without UE support. Tolerated treatment well with moderate fatigue requiring intermittent therapeutic rest breaks.  Pt will continue to benefit from skilled therapy to address remaining deficits in order to improve overall  QoL and return to PLOF.     OBJECTIVE IMPAIRMENTS: Abnormal gait, decreased activity tolerance, decreased balance, decreased endurance, decreased mobility, difficulty walking, decreased strength, impaired flexibility, and improper body mechanics.   ACTIVITY LIMITATIONS: carrying, lifting, bending, standing, squatting, stairs, transfers, bed mobility, dressing, and locomotion level  PARTICIPATION LIMITATIONS: meal prep, cleaning, laundry, driving, shopping, occupation, and yard work  PERSONAL FACTORS: arthritis, congestive heart failure, CID, CDD, enlarged prostate, HTN, HLD, left inguinal hernia, type 2 diabetes are also affecting patient's functional outcome.   REHAB POTENTIAL: Good  CLINICAL DECISION MAKING: Evolving/moderate complexity  EVALUATION COMPLEXITY: Moderate   GOALS: Goals reviewed with patient? Yes  SHORT TERM GOALS: Target date: 06/26/2023  Patient will be independent in home exercise program to improve strength/mobility for better functional independence with ADLs. Baseline: 05/29/23: HEP given  Goal status: INITIAL   LONG TERM GOALS: Target date: 07/24/23  Patient will increase 10 meter walk test to >1.51m/s as to improve gait speed for better community ambulation and to reduce fall risk. Baseline: .47 m/s Goal status: INITIAL  2.  Patient will increase FOTO score to equal to or greater than 51 to demonstrate statistically significant improvement in mobility and quality of life. Baseline: 05/29/23: 38 Goal status: INITIAL  3.  Patient (> 79 years old) will complete five times sit to  stand test in < 15 seconds indicating an increased LE strength and improved balance. Baseline: 16.59 Goal status: INITIAL  4. Patient will ambulate 150 ft  without the use of an assistive device in order to increase independence and improve quality of life.  Baseline: Not tested  Goal status: INITIAL  5.  Patient will increase Berg Balance score by > 6 points to demonstrate  decreased fall risk during functional activities. Baseline: awaiting testing until precautions are lifted  Goal status: INITIAL     PLAN:  PT FREQUENCY: 1-2x/week  PT DURATION: 8 weeks  PLANNED INTERVENTIONS: Therapeutic exercises, Therapeutic activity, Neuromuscular re-education, Balance training, Gait training, Patient/Family education, Self Care, Joint mobilization, Stair training, Vestibular training, Canalith repositioning, DME instructions, Dry Needling, Electrical stimulation, Cryotherapy, Moist heat, scar mobilization, Taping, Manual therapy, and Re-evaluation.  PLAN FOR NEXT SESSION:   Coordination, balance and BLE strengthening.  Functional gait training.  Grier Rocher PT, DPT  Physical Therapist - [place]  [facility]  3:49 PM 07/05/23

## 2023-07-09 ENCOUNTER — Ambulatory Visit: Payer: 59

## 2023-07-09 DIAGNOSIS — R2681 Unsteadiness on feet: Secondary | ICD-10-CM

## 2023-07-09 DIAGNOSIS — M6281 Muscle weakness (generalized): Secondary | ICD-10-CM

## 2023-07-09 DIAGNOSIS — R278 Other lack of coordination: Secondary | ICD-10-CM

## 2023-07-09 DIAGNOSIS — R262 Difficulty in walking, not elsewhere classified: Secondary | ICD-10-CM

## 2023-07-09 NOTE — Therapy (Incomplete)
OUTPATIENT PHYSICAL THERAPY THORACOLUMBAR and NEURO TREATMENT / Physical Therapy Progress Note   Dates of reporting period  05/29/2023   to   07/09/2023   Patient Name: Lawrence Holmes MRN: [id number] DOB:[birth year], 67 y.o., male Today's Date: 07/10/2023  END OF SESSION:  PT End of Session - 07/09/23 1539     Visit Number 10    Number of Visits 16    Date for PT Re-Evaluation 07/24/23    Progress Note Due on Visit 10    PT Start Time 1537    PT Stop Time 1615    PT Time Calculation (min) 38 min    Equipment Utilized During Treatment Gait belt    Activity Tolerance Patient tolerated treatment well;No increased pain    Behavior During Therapy [facility] for tasks assessed/performed               Past Medical History:  Diagnosis Date   Aortic atherosclerosis (HCC)    Arthritis    BPH with urinary obstruction    Chronic systolic heart failure (HCC) 07/23/2017   a.) TTE 07/23/2017: EF 30-35%, mild LVH, inferolateral and inferior AK, mild BAE, G1DD; b.) TTE 10/23/2017: EF 40-45%, mild LVH, basal-mid inferior and inferoseptal AK, mild MR; c.) TTE 11/19/2021: EF 45-50%, glob HK with mod to severe inferior and basal to mid septal HK, GLS -15.3%, mild MR, G1DD.   Coronary artery disease 07/21/2017   a. 07/2017 Late presenting inferior STEMI/Cath: LM nl, LAD min irregs, LCX 100d (L->L collats), RCA 100p (L->R collats), EF 25-35%-->Med Rx; b. 10/2017 ETT (DOT): Ex time 7:38, baseline antlat TWI, no acute changes; c. 09/2019 ETT: Ex time 6:49. No ECG changes.   DDD (degenerative disc disease), lumbar    a.) s/p MIS L2-3 laminectomy and disectomy   Diverticulosis of colon    Erectile dysfunction    HTN (hypertension)    Hx of bilateral cataract extraction    Hyperlipidemia    Ischemic cardiomyopathy    a.) LHC 07/21/2017: EF 25-35%; b.) TTE 07/23/2017: 30-35%; c.) TTE 10/23/2017: EF 40-45%; d.) TTE 11/19/2021: EF 45-50%   Left inguinal hernia    a.) s/p repair 05/19/2022   ST elevation  myocardial infarction (STEMI) of inferior wall (HCC) 07/21/2017   a.) LHC 07/21/2017: EF 25-35%, LVEDP 14 mmHg. 40% pLCx, 40% mLCx, 100% dLCx, 100% pRCA --> attempted to cross wire at RCA lesion, however unable. Reasonable RCA collaterals already formed --> med mgmt.   T2DM (type 2 diabetes mellitus) (HCC)    Tobacco use    Past Surgical History:  Procedure Laterality Date   CATARACT EXTRACTION Bilateral    COLONOSCOPY WITH PROPOFOL N/A 04/04/2019   Procedure: COLONOSCOPY WITH PROPOFOL;  Surgeon: Pasty Spillers, MD;  Location: [facility];  Service: Endoscopy;  Laterality: N/A;   CYSTOSCOPY WITH FULGERATION N/A 05/14/2023   Procedure: CYSTOSCOPY WITH CLOT EVACUATION AND FULGERATION;  Surgeon: Vanna Scotland, MD;  Location: [facility] OR[facility];  Service: Urology;  Laterality: N/A;   HOLEP-LASER ENUCLEATION OF THE PROSTATE WITH MORCELLATION N/A 06/08/2023   Procedure: HOLEP-LASER ENUCLEATION OF THE PROSTATE WITH MORCELLATION;  Surgeon: Sondra Come, MD;  Location: [facility] ORS;  Service: Urology;  Laterality: N/A;   LEFT HEART CATH AND CORONARY ANGIOGRAPHY N/A 07/21/2017   Procedure: LEFT HEART CATH AND CORONARY ANGIOGRAPHY;  Surgeon: Iran Ouch, MD;  Location: [facility];  Service: Cardiovascular;  Laterality: N/A;   LUMBAR LAMINECTOMY/DECOMPRESSION MICRODISCECTOMY Bilateral 04/05/2023   Procedure: MINIMALLY INVASIVE (MIS) L2-3 LAMINECTOMY AND DISCECTOMY;  Surgeon: Loreen Freud, MD;  Location: [facility] OR[facility];  Service: Neurosurgery;  Laterality: Bilateral;   XI ROBOTIC ASSISTED INGUINAL HERNIA REPAIR WITH MESH Left 05/19/2022   Procedure: XI ROBOTIC ASSISTED INGUINAL HERNIA REPAIR WITH MESH;  Surgeon: Campbell Lerner, MD;  Location: [facility] OR[facility];  Service: General;  Laterality: Left;   Patient Active Problem List   Diagnosis Date Noted   S/P lumbar microdiscectomy 05/16/2023   Hematuria 05/12/2023   Acute urinary retention 05/12/2023   Bilateral hydronephrosis 05/12/2023   HTN  (hypertension) 05/12/2023   HLD (hyperlipidemia) 05/12/2023   Type II diabetes mellitus with renal manifestations (HCC) 05/12/2023   Chronic kidney disease, stage 3a (HCC) 05/12/2023   Chronic systolic CHF (congestive heart failure) (HCC) 05/12/2023   BPH (benign prostatic hyperplasia) 05/12/2023   UTI (urinary tract infection) 05/12/2023   CAD (coronary artery disease) 05/12/2023   Intervertebral lumbar disc disorder with myelopathy, lumbar region 04/02/2023   Impaired functional mobility, balance, gait, and endurance 03/26/2023   Bilateral leg weakness 03/26/2023   Abnormal prostate exam 05/09/2022   Aortic atherosclerosis (HCC) 02/24/2022   Smokes with greater than 30 pack year history 11/18/2021   Coronary artery disease of native artery of native heart with stable angina pectoris (HCC) 05/19/2021   Dyslipidemia 07/30/2020   Polyp of colon    Type 2 diabetes mellitus, controlled (HCC) 02/08/2018   History of myocardial infarction 11/08/2017   Tobacco use 11/08/2017   Ischemic cardiomyopathy 11/08/2017   CHF (congestive heart failure) (HCC) 11/08/2017   Foot callus 11/08/2017   Onychomycosis of multiple toenails with type 2 diabetes mellitus (HCC) 11/08/2017   Cataract, nuclear sclerotic, left eye 04/04/2016    PCP: Danelle Berry, PA-C  REFERRING PROVIDER: Oswaldo Conroy Mecum, PA-C  REFERRING DIAG:  M51.06 (ICD-10-CM) - Intervertebral lumbar disc disorder with myelopathy, lumbar region  Z74.09 (ICD-10-CM) - Impaired functional mobility, balance, gait, and endurance  R29.898 (ICD-10-CM) - Bilateral leg weakness  Z98.890 (ICD-10-CM) - S/P lumbar microdiscectomy    Rationale for Evaluation and Treatment: Rehabilitation  THERAPY DIAG:  Other lack of coordination  Difficulty in walking, not elsewhere classified  Unsteadiness on feet  Muscle weakness (generalized)  ONSET DATE: 03/2023  SUBJECTIVE:                                                                                                                                                                                            SUBJECTIVE STATEMENT:  Patient reports walking some at home without cane and has been able to reach down and pick up objects off the floor.      PERTINENT HISTORY:  Approximately 6 weeks status  post Right L2-L3 Laminectomy and microdiscectomy (04/05/23).  He states that he has graduated from his wheelchair and is almost only using his walker, trying to graduate to his cane.  Patient has a past medical history of arthritis, congestive heart failure, CID, CDD, enlarged prostate, HTN, HLD, left inguinal hernia, type 2 diabetes. 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    PAIN:  Are you having pain? No  Current: 0/10, At worst: 3/10  Pt denies reports of radiating pain, localizes to one area    PRECAUTIONS: Back   He can increase his weight to 25 pounds  RED FLAGS: None   WEIGHT BEARING RESTRICTIONS:  No lifting more than 25 pounds   FALLS:  Has patient fallen in last 6 months? No  LIVING ENVIRONMENT: Lives with: lives alone Lives in: House/apartment Stairs: Yes: External: 3 steps; on left going up Has following equipment at home: Single point cane and Walker - 2 wheeled, grab bars in shower   OCCUPATION: Engineer, drilling   PLOF: Independent  PATIENT GOALS: "Get back to walking without a walker or cane"   NEXT MD VISIT: 06/08/23 : prostate procedure   OBJECTIVE:  Note: Objective measures were completed at Evaluation unless otherwise noted.  DIAGNOSTIC FINDINGS:  IMPRESSION from MRI 03/26/23 1. Severe spinal canal narrowing at L2-L3 secondary to a combination of ligamentum flavum hypertrophy, short pedicles, and a disc bulge. 2. Severe left and moderate right neural foraminal narrowing at L5-S1.   PATIENT SURVEYS:  FOTO 05/29/23: 38  Typical result : 51  SCREENING FOR RED  FLAGS: Bowel or bladder incontinence: No, Yes for bladder due to prostate Spinal tumors: No Cauda equina syndrome: No Compression fracture: No Abdominal aneurysm: No  COGNITION: Overall cognitive status: Within functional limits for tasks assessed     SENSATION: Light touch: WFL Coordination intact   MUSCLE LENGTH: Hamstrings: Right WNL deg; Left 56 deg   POSTURE: rounded shoulders and increased thoracic kyphosis  PALPATION: Tightness of bilateral lumbar paraspinals and quadratus lumborum  Incision neat, healing well.   LUMBAR ROM:   Not tested due to precautions  AROM eval  Flexion   Extension   Right lateral flexion   Left lateral flexion   Right rotation   Left rotation    (Blank rows = not tested)   LOWER EXTREMITY MMT:    MMT Right eval Left eval  Hip flexion 4-/5 4-/5  Hip extension    Hip abduction 4 4  Hip adduction 4- 4  Hip internal rotation    Hip external rotation    Knee flexion 4-/5 4-/5  Knee extension 4+ 4+  Ankle dorsiflexion 4/5 4/5  Ankle plantarflexion 4-/5 4-/5  Ankle inversion    Ankle eversion     (Blank rows = not tested)  LUMBAR SPECIAL TESTS:  Deferred due to precautions Hip special tests: FABER and Scour negative SLR : negative   FUNCTIONAL TESTS:  5 times sit to stand: 16.59s intermittent hand use 10 meter walk test: 20.97  .27m/s with walker   GAIT: Distance walked: 36ft  Assistive device utilized: Environmental consultant - 2 wheeled Level of assistance: CGA Comments: External rotation at the hips, kyphotic posture, poor foot clearance bilateral with L> R. Decreased heel strike with foot flat noted bilaterally  TODAY'S TREATMENT: DATE: 07/10/23  Physical therapy treatment session today consisted of completing assessment of goals and administration of testing as demonstrated and documented in flow sheet, treatment, and  goals section of this note. Addition treatments may be found below.    No pain reported throughout session.    10 Meter Walk Test: Patient instructed to walk 10 meters (32.8 ft) as quickly and as safely as possible at their normal speed x2 and at a fast speed x2. Time measured from 2 meter mark to 8 meter mark to accommodate ramp-up and ramp-down.  Normal speed 1: 0.58 m/s with cane Normal speed 2: 0.58 m/s without cane Average Normal speed: 0.58 m/s Cut off scores: <0.4 m/s = household Ambulator, 0.4-0.8 m/s = limited community Ambulator, >0.8 m/s = community Ambulator, >1.2 m/s = crossing a street, <1.0 = increased fall risk MCID 0.05 m/s (small), 0.13 m/s (moderate), 0.06 m/s (significant)  (ANPTA Core Set of Outcome Measures for Adults with Neurologic Conditions, 2018)   BERG= 41/56  [facility] PT Assessment - 07/09/23 1551       Berg Balance Test   Sit to Stand Able to stand without using hands and stabilize independently    Standing Unsupported Able to stand safely 2 minutes    Sitting with Back Unsupported but Feet Supported on Floor or Stool Able to sit safely and securely 2 minutes    Stand to Sit Sits safely with minimal use of hands    Transfers Able to transfer safely, minor use of hands    Standing Unsupported with Eyes Closed Able to stand 10 seconds with supervision    Standing Unsupported with Feet Together Able to place feet together independently and stand for 1 minute with supervision    From Standing, Reach Forward with Outstretched Arm Can reach forward >12 cm safely (5")    From Standing Position, Pick up Object from Floor Able to pick up shoe, needs supervision    From Standing Position, Turn to Look Behind Over each Shoulder Turn sideways only but maintains balance    Turn 360 Degrees Able to turn 360 degrees safely but slowly    Standing Unsupported, Alternately Place Feet on Step/Stool Able to complete 4 steps without aid or supervision    Standing Unsupported, One Foot in Front Able to take small step independently and hold 30 seconds    Standing on One Leg Tries to lift  leg/unable to hold 3 seconds but remains standing independently    Total Score 41              Pt performed 5 time sit<>stand (5xSTS): 18.45 sec (>15 sec indicates increased fall risk)       PATIENT EDUCATION:  Education details: HEP, POC, goals  Person educated: Patient Education method: Explanation, Demonstration, and Handouts Education comprehension: verbalized understanding and returned demonstration  HOME EXERCISE PROGRAM: Access Code: [email or web address] URL: [email or web address] Date: 05/29/2023 Prepared by: Precious Bard  Exercises - Standing March with Counter Support  - 1 x daily - 7 x weekly - 2 sets - 10 reps - 5 hold - Heel Raises with Counter Support  - 1 x daily - 7 x weekly - 2 sets - 10 reps - 5 hold - Sit to Stand Without Arm Support  - 1 x daily - 7 x weekly - 2 sets - 10 reps - 5 hold  ASSESSMENT:  CLINICAL IMPRESSION: Patient progress was reassessed for 10th visit today. He presents with good motivation and no pain reported during testing. He performed well with all activities demonstrating progress with self perceived abilities and improved functional mobility including gait speed with and without device. He still presents with overall  LE muscle weakness (as seen by 5x STS- although improved) and impaired mobility with some imbalance and patient's condition has the potential to improve in response to therapy. Maximum improvement is yet to be obtained. The anticipated improvement is attainable and reasonable in a generally predictable time.  Pt will continue to benefit from skilled therapy to address remaining deficits in order to improve overall QoL and return to PLOF.     OBJECTIVE IMPAIRMENTS: Abnormal gait, decreased activity tolerance, decreased balance, decreased endurance, decreased mobility, difficulty walking, decreased strength, impaired flexibility, and improper body mechanics.   ACTIVITY LIMITATIONS: carrying, lifting, bending, standing,  squatting, stairs, transfers, bed mobility, dressing, and locomotion level  PARTICIPATION LIMITATIONS: meal prep, cleaning, laundry, driving, shopping, occupation, and yard work  PERSONAL FACTORS: arthritis, congestive heart failure, CID, CDD, enlarged prostate, HTN, HLD, left inguinal hernia, type 2 diabetes are also affecting patient's functional outcome.   REHAB POTENTIAL: Good  CLINICAL DECISION MAKING: Evolving/moderate complexity  EVALUATION COMPLEXITY: Moderate   GOALS: Goals reviewed with patient? Yes  SHORT TERM GOALS: Target date: 06/26/2023  Patient will be independent in home exercise program to improve strength/mobility for better functional independence with ADLs. Baseline: 05/29/23: HEP given ; 07/09/2023= Patient reports compliance with HEP to date and no questions.  Goal status: MET   LONG TERM GOALS: Target date: 07/24/23  Patient will increase 10 meter walk test to >1.76m/s as to improve gait speed for better community ambulation and to reduce fall risk. Baseline: .47 m/s; 07/09/2023= 0.58 m/s with and without cane Goal status: PROGRESSING  2.  Patient will increase FOTO score to equal to or greater than 51 to demonstrate statistically significant improvement in mobility and quality of life. Baseline: 05/29/23: 38; 07/09/2023= 49 Goal status: PROGRESSING  3.  Patient (> 44 years old) will complete five times sit to stand test in < 15 seconds indicating an increased LE strength and improved balance. Baseline: 16.59 Intermittent hand use; 07/09/2023= 18.45 sec without UE support  Goal status: PROGRESSING  4. Patient will ambulate 150 ft  without the use of an assistive device in order to increase independence and improve quality of life.  Baseline: Not tested  Goal status: INITIAL  5.  Patient will increase Berg Balance score by > 6 points to demonstrate decreased fall risk during functional activities. Baseline: awaiting testing until precautions are lifted;  07/10/2023= 41/56 Goal status: INITIAL     PLAN:  PT FREQUENCY: 1-2x/week  PT DURATION: 8 weeks  PLANNED INTERVENTIONS: Therapeutic exercises, Therapeutic activity, Neuromuscular re-education, Balance training, Gait training, Patient/Family education, Self Care, Joint mobilization, Stair training, Vestibular training, Canalith repositioning, DME instructions, Dry Needling, Electrical stimulation, Cryotherapy, Moist heat, scar mobilization, Taping, Manual therapy, and Re-evaluation.  PLAN FOR NEXT SESSION:   Coordination, balance and BLE strengthening.  Functional gait training.    Debara Pickett, SPT This entire session was performed under direct supervision and direction of a licensed Estate agent . I have personally read, edited and approve of the note as written.    Louis Meckel, PT Physical Therapist - [facility]  8:13 AM 07/10/23

## 2023-07-11 ENCOUNTER — Ambulatory Visit: Payer: 59 | Admitting: Physical Therapy

## 2023-07-11 DIAGNOSIS — M6281 Muscle weakness (generalized): Secondary | ICD-10-CM | POA: Diagnosis not present

## 2023-07-11 DIAGNOSIS — R262 Difficulty in walking, not elsewhere classified: Secondary | ICD-10-CM

## 2023-07-11 DIAGNOSIS — R278 Other lack of coordination: Secondary | ICD-10-CM

## 2023-07-11 DIAGNOSIS — R2681 Unsteadiness on feet: Secondary | ICD-10-CM

## 2023-07-11 NOTE — Therapy (Signed)
OUTPATIENT PHYSICAL THERAPY THORACOLUMBAR and NEURO TREATMENT   Patient Name: Lawrence Holmes MRN: [id number] DOB:04-25-1956, 67 y.o., male Today's Date: 07/11/2023  END OF SESSION:  PT End of Session - 07/11/23 1104     Visit Number 11    Number of Visits 16    Date for PT Re-Evaluation 07/24/23    Progress Note Due on Visit 10    PT Start Time 1100    PT Stop Time 1143    PT Time Calculation (min) 43 min    Equipment Utilized During Treatment Gait belt    Activity Tolerance Patient tolerated treatment well;No increased pain    Behavior During Therapy [facility] for tasks assessed/performed                Past Medical History:  Diagnosis Date   Aortic atherosclerosis (HCC)    Arthritis    BPH with urinary obstruction    Chronic systolic heart failure (HCC) 07/23/2017   a.) TTE 07/23/2017: EF 30-35%, mild LVH, inferolateral and inferior AK, mild BAE, G1DD; b.) TTE 10/23/2017: EF 40-45%, mild LVH, basal-mid inferior and inferoseptal AK, mild MR; c.) TTE 11/19/2021: EF 45-50%, glob HK with mod to severe inferior and basal to mid septal HK, GLS -15.3%, mild MR, G1DD.   Coronary artery disease 07/21/2017   a. 07/2017 Late presenting inferior STEMI/Cath: LM nl, LAD min irregs, LCX 100d (L->L collats), RCA 100p (L->R collats), EF 25-35%-->Med Rx; b. 10/2017 ETT (DOT): Ex time 7:38, baseline antlat TWI, no acute changes; c. 09/2019 ETT: Ex time 6:49. No ECG changes.   DDD (degenerative disc disease), lumbar    a.) s/p MIS L2-3 laminectomy and disectomy   Diverticulosis of colon    Erectile dysfunction    HTN (hypertension)    Hx of bilateral cataract extraction    Hyperlipidemia    Ischemic cardiomyopathy    a.) LHC 07/21/2017: EF 25-35%; b.) TTE 07/23/2017: 30-35%; c.) TTE 10/23/2017: EF 40-45%; d.) TTE 11/19/2021: EF 45-50%   Left inguinal hernia    a.) s/p repair 05/19/2022   ST elevation myocardial infarction (STEMI) of inferior wall (HCC) 07/21/2017   a.) LHC 07/21/2017: EF  25-35%, LVEDP 14 mmHg. 40% pLCx, 40% mLCx, 100% dLCx, 100% pRCA --> attempted to cross wire at RCA lesion, however unable. Reasonable RCA collaterals already formed --> med mgmt.   T2DM (type 2 diabetes mellitus) (HCC)    Tobacco use    Past Surgical History:  Procedure Laterality Date   CATARACT EXTRACTION Bilateral    COLONOSCOPY WITH PROPOFOL N/A 04/04/2019   Procedure: COLONOSCOPY WITH PROPOFOL;  Surgeon: Pasty Spillers, MD;  Location: [facility] END[facility];  Service: Endoscopy;  Laterality: N/A;   CYSTOSCOPY WITH FULGERATION N/A 05/14/2023   Procedure: CYSTOSCOPY WITH CLOT EVACUATION AND FULGERATION;  Surgeon: Vanna Scotland, MD;  Location: [facility] OR[facility];  Service: Urology;  Laterality: N/A;   HOLEP-LASER ENUCLEATION OF THE PROSTATE WITH MORCELLATION N/A 06/08/2023   Procedure: HOLEP-LASER ENUCLEATION OF THE PROSTATE WITH MORCELLATION;  Surgeon: Sondra Come, MD;  Location: [facility] ORS;  Service: Urology;  Laterality: N/A;   LEFT HEART CATH AND CORONARY ANGIOGRAPHY N/A 07/21/2017   Procedure: LEFT HEART CATH AND CORONARY ANGIOGRAPHY;  Surgeon: Iran Ouch, MD;  Location: [facility] INV[facility];  Service: Cardiovascular;  Laterality: N/A;   LUMBAR LAMINECTOMY/DECOMPRESSION MICRODISCECTOMY Bilateral 04/05/2023   Procedure: MINIMALLY INVASIVE (MIS) L2-3 LAMINECTOMY AND DISCECTOMY;  Surgeon: Loreen Freud, MD;  Location: [facility] OR[facility];  Service: Neurosurgery;  Laterality: Bilateral;  XI ROBOTIC ASSISTED INGUINAL HERNIA REPAIR WITH MESH Left 05/19/2022   Procedure: XI ROBOTIC ASSISTED INGUINAL HERNIA REPAIR WITH MESH;  Surgeon: Campbell Lerner, MD;  Location: [facility] OR[facility];  Service: General;  Laterality: Left;   Patient Active Problem List   Diagnosis Date Noted   S/P lumbar microdiscectomy 05/16/2023   Hematuria 05/12/2023   Acute urinary retention 05/12/2023   Bilateral hydronephrosis 05/12/2023   HTN (hypertension) 05/12/2023   HLD (hyperlipidemia) 05/12/2023   Type II diabetes mellitus  with renal manifestations (HCC) 05/12/2023   Chronic kidney disease, stage 3a (HCC) 05/12/2023   Chronic systolic CHF (congestive heart failure) (HCC) 05/12/2023   BPH (benign prostatic hyperplasia) 05/12/2023   UTI (urinary tract infection) 05/12/2023   CAD (coronary artery disease) 05/12/2023   Intervertebral lumbar disc disorder with myelopathy, lumbar region 04/02/2023   Impaired functional mobility, balance, gait, and endurance 03/26/2023   Bilateral leg weakness 03/26/2023   Abnormal prostate exam 05/09/2022   Aortic atherosclerosis (HCC) 02/24/2022   Smokes with greater than 30 pack year history 11/18/2021   Coronary artery disease of native artery of native heart with stable angina pectoris (HCC) 05/19/2021   Dyslipidemia 07/30/2020   Polyp of colon    Type 2 diabetes mellitus, controlled (HCC) 02/08/2018   History of myocardial infarction 11/08/2017   Tobacco use 11/08/2017   Ischemic cardiomyopathy 11/08/2017   CHF (congestive heart failure) (HCC) 11/08/2017   Foot callus 11/08/2017   Onychomycosis of multiple toenails with type 2 diabetes mellitus (HCC) 11/08/2017   Cataract, nuclear sclerotic, left eye 04/04/2016    PCP: Danelle Berry, PA-C  REFERRING PROVIDER: Oswaldo Conroy Mecum, PA-C  REFERRING DIAG:  M51.06 (ICD-10-CM) - Intervertebral lumbar disc disorder with myelopathy, lumbar region  Z74.09 (ICD-10-CM) - Impaired functional mobility, balance, gait, and endurance  R29.898 (ICD-10-CM) - Bilateral leg weakness  Z98.890 (ICD-10-CM) - S/P lumbar microdiscectomy    Rationale for Evaluation and Treatment: Rehabilitation  THERAPY DIAG:  Other lack of coordination  Difficulty in walking, not elsewhere classified  Unsteadiness on feet  Muscle weakness (generalized)  ONSET DATE: 03/2023  SUBJECTIVE:                                                                                                                                                                                            SUBJECTIVE STATEMENT:  Patient reports no changes since last date. Overall he has been doing well and sees progress.   PERTINENT HISTORY:  Approximately 6 weeks status post Right L2-L3 Laminectomy and microdiscectomy (04/05/23).  He states that he has graduated from his wheelchair and is almost only using his walker, trying to  graduate to his cane.  Patient has a past medical history of arthritis, congestive heart failure, CID, CDD, enlarged prostate, HTN, HLD, left inguinal hernia, type 2 diabetes. 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    PAIN:  Are you having pain? No  Current: 0/10, At worst: 3/10  Pt denies reports of radiating pain, localizes to one area    PRECAUTIONS: Back   He can increase his weight to 25 pounds  RED FLAGS: None   WEIGHT BEARING RESTRICTIONS:  No lifting more than 25 pounds   FALLS:  Has patient fallen in last 6 months? No  LIVING ENVIRONMENT: Lives with: lives alone Lives in: House/apartment Stairs: Yes: External: 3 steps; on left going up Has following equipment at home: Single point cane and Walker - 2 wheeled, grab bars in shower   OCCUPATION: Engineer, drilling   PLOF: Independent  PATIENT GOALS: "Get back to walking without a walker or cane"   NEXT MD VISIT: 06/08/23 : prostate procedure   OBJECTIVE:  Note: Objective measures were completed at Evaluation unless otherwise noted.  DIAGNOSTIC FINDINGS:  IMPRESSION from MRI 03/26/23 1. Severe spinal canal narrowing at L2-L3 secondary to a combination of ligamentum flavum hypertrophy, short pedicles, and a disc bulge. 2. Severe left and moderate right neural foraminal narrowing at L5-S1.   PATIENT SURVEYS:  FOTO 05/29/23: 38  Typical result : 51  SCREENING FOR RED FLAGS: Bowel or bladder incontinence: No, Yes for bladder due to prostate Spinal tumors: No Cauda equina syndrome:  No Compression fracture: No Abdominal aneurysm: No  COGNITION: Overall cognitive status: Within functional limits for tasks assessed     SENSATION: Light touch: WFL Coordination intact   MUSCLE LENGTH: Hamstrings: Right WNL deg; Left 56 deg   POSTURE: rounded shoulders and increased thoracic kyphosis  PALPATION: Tightness of bilateral lumbar paraspinals and quadratus lumborum  Incision neat, healing well.   LUMBAR ROM:   Not tested due to precautions  AROM eval  Flexion   Extension   Right lateral flexion   Left lateral flexion   Right rotation   Left rotation    (Blank rows = not tested)   LOWER EXTREMITY MMT:    MMT Right eval Left eval  Hip flexion 4-/5 4-/5  Hip extension    Hip abduction 4 4  Hip adduction 4- 4  Hip internal rotation    Hip external rotation    Knee flexion 4-/5 4-/5  Knee extension 4+ 4+  Ankle dorsiflexion 4/5 4/5  Ankle plantarflexion 4-/5 4-/5  Ankle inversion    Ankle eversion     (Blank rows = not tested)  LUMBAR SPECIAL TESTS:  Deferred due to precautions Hip special tests: FABER and Scour negative SLR : negative   FUNCTIONAL TESTS:  5 times sit to stand: 16.59s intermittent hand use 10 meter walk test: 20.97  .21m/s with walker   GAIT: Distance walked: 11ft  Assistive device utilized: Environmental consultant - 2 wheeled Level of assistance: CGA Comments: External rotation at the hips, kyphotic posture, poor foot clearance bilateral with L> R. Decreased heel strike with foot flat noted bilaterally  TODAY'S TREATMENT: DATE: 07/11/23   TherEx   Nustep progressive intervals level 1 x 1 min, level 3 x 3 min, level 5 x 2 min   TA  Stepping ant, sidestepping around exercise machines in therapy gym  X 2 rounds through then rest   Weave through 10 cones x 3  with SPC  Ant step to 1/2 form roller with cues for slow and controlled motion and with heel strike.   Gait with SPC x 151ft and CGA. Cues for steps slimilar to above heel  strike with controlled movement, good foot clearance but still ataxia noted.  Additional gait with SPC x 198ft with CGA and noted improvement in step   Throughout session, PT provided CGA for safety with tactile cues for weight shifting to allow adequate step height and reduce foot drag.   Multiple therapeutic rest breaks throughout session due to BLE weakness. No pain reported throughout session.    PATIENT EDUCATION:  Education details: HEP, POC, goals  Person educated: Patient Education method: Explanation, Demonstration, and Handouts Education comprehension: verbalized understanding and returned demonstration  HOME EXERCISE PROGRAM: Access Code: [email or web address] URL: [email or web address] Date: 05/29/2023 Prepared by: Precious Bard  Exercises - Standing March with Counter Support  - 1 x daily - 7 x weekly - 2 sets - 10 reps - 5 hold - Heel Raises with Counter Support  - 1 x daily - 7 x weekly - 2 sets - 10 reps - 5 hold - Sit to Stand Without Arm Support  - 1 x daily - 7 x weekly - 2 sets - 10 reps - 5 hold  ASSESSMENT:  CLINICAL IMPRESSION:  Patient arrived with good motivation form completion of pt activities.  Pt progresses with dynamic gait and gait training tasks this date. Pt shows improved gait pattern at times with improved ataxia but ataxia is more profound with fatigue. Pt will continue to benefit from skilled physical therapy intervention to address impairments, improve QOL, and attain therapy goals.    OBJECTIVE IMPAIRMENTS: Abnormal gait, decreased activity tolerance, decreased balance, decreased endurance, decreased mobility, difficulty walking, decreased strength, impaired flexibility, and improper body mechanics.   ACTIVITY LIMITATIONS: carrying, lifting, bending, standing, squatting, stairs, transfers, bed mobility, dressing, and locomotion level  PARTICIPATION LIMITATIONS: meal prep, cleaning, laundry, driving, shopping, occupation, and yard  work  PERSONAL FACTORS: arthritis, congestive heart failure, CID, CDD, enlarged prostate, HTN, HLD, left inguinal hernia, type 2 diabetes are also affecting patient's functional outcome.   REHAB POTENTIAL: Good  CLINICAL DECISION MAKING: Evolving/moderate complexity  EVALUATION COMPLEXITY: Moderate   GOALS: Goals reviewed with patient? Yes  SHORT TERM GOALS: Target date: 06/26/2023  Patient will be independent in home exercise program to improve strength/mobility for better functional independence with ADLs. Baseline: 05/29/23: HEP given ; 07/09/2023= Patient reports compliance with HEP to date and no questions.  Goal status: MET   LONG TERM GOALS: Target date: 07/24/23  Patient will increase 10 meter walk test to >1.50m/s as to improve gait speed for better community ambulation and to reduce fall risk. Baseline: .47 m/s; 07/09/2023= 0.58 m/s with and without cane Goal status: PROGRESSING  2.  Patient will increase FOTO score to equal to or greater than 51 to demonstrate statistically significant improvement in mobility and quality of life. Baseline: 05/29/23: 38; 07/09/2023= 49 Goal status: PROGRESSING  3.  Patient (> 27 years old) will complete five times sit to stand test in < 15 seconds indicating an increased LE strength and improved balance. Baseline: 16.59 Intermittent hand use; 07/09/2023= 18.45 sec without UE support  Goal status: PROGRESSING  4. Patient will ambulate 150 ft  without the use of an assistive device in order to increase independence and improve quality of life.  Baseline: Not tested  Goal status: INITIAL  5.  Patient will increase Berg Balance score by >  6 points to demonstrate decreased fall risk during functional activities. Baseline: awaiting testing until precautions are lifted; 07/10/2023= 41/56 Goal status: INITIAL     PLAN:  PT FREQUENCY: 1-2x/week  PT DURATION: 8 weeks  PLANNED INTERVENTIONS: Therapeutic exercises, Therapeutic  activity, Neuromuscular re-education, Balance training, Gait training, Patient/Family education, Self Care, Joint mobilization, Stair training, Vestibular training, Canalith repositioning, DME instructions, Dry Needling, Electrical stimulation, Cryotherapy, Moist heat, scar mobilization, Taping, Manual therapy, and Re-evaluation.  PLAN FOR NEXT SESSION:   Coordination, balance and BLE strengthening.  Functional gait training.   Norman Herrlich PT ,DPT Physical Therapist- [facility]   11:07 AM 07/11/23

## 2023-07-16 ENCOUNTER — Other Ambulatory Visit: Payer: Self-pay | Admitting: *Deleted

## 2023-07-16 ENCOUNTER — Ambulatory Visit: Payer: 59 | Admitting: Physical Therapy

## 2023-07-16 DIAGNOSIS — M6281 Muscle weakness (generalized): Secondary | ICD-10-CM

## 2023-07-16 DIAGNOSIS — R2681 Unsteadiness on feet: Secondary | ICD-10-CM

## 2023-07-16 DIAGNOSIS — R278 Other lack of coordination: Secondary | ICD-10-CM

## 2023-07-16 DIAGNOSIS — R262 Difficulty in walking, not elsewhere classified: Secondary | ICD-10-CM

## 2023-07-16 DIAGNOSIS — I493 Ventricular premature depolarization: Secondary | ICD-10-CM

## 2023-07-16 NOTE — Therapy (Signed)
OUTPATIENT PHYSICAL THERAPY THORACOLUMBAR and NEURO TREATMENT   Patient Name: Lawrence Holmes MRN: [id number] DOB:1955/09/09, 67 y.o., male Today's Date: 07/16/2023  END OF SESSION:  PT End of Session - 07/16/23 1017     Visit Number 12    Number of Visits 16    Date for PT Re-Evaluation 07/24/23    Progress Note Due on Visit 20    PT Start Time 1020    PT Stop Time 1100    PT Time Calculation (min) 40 min    Equipment Utilized During Treatment Gait belt    Activity Tolerance Patient tolerated treatment well;No increased pain    Behavior During Therapy [facility] for tasks assessed/performed                Past Medical History:  Diagnosis Date   Aortic atherosclerosis (HCC)    Arthritis    BPH with urinary obstruction    Chronic systolic heart failure (HCC) 07/23/2017   a.) TTE 07/23/2017: EF 30-35%, mild LVH, inferolateral and inferior AK, mild BAE, G1DD; b.) TTE 10/23/2017: EF 40-45%, mild LVH, basal-mid inferior and inferoseptal AK, mild MR; c.) TTE 11/19/2021: EF 45-50%, glob HK with mod to severe inferior and basal to mid septal HK, GLS -15.3%, mild MR, G1DD.   Coronary artery disease 07/21/2017   a. 07/2017 Late presenting inferior STEMI/Cath: LM nl, LAD min irregs, LCX 100d (L->L collats), RCA 100p (L->R collats), EF 25-35%-->Med Rx; b. 10/2017 ETT (DOT): Ex time 7:38, baseline antlat TWI, no acute changes; c. 09/2019 ETT: Ex time 6:49. No ECG changes.   DDD (degenerative disc disease), lumbar    a.) s/p MIS L2-3 laminectomy and disectomy   Diverticulosis of colon    Erectile dysfunction    HTN (hypertension)    Hx of bilateral cataract extraction    Hyperlipidemia    Ischemic cardiomyopathy    a.) LHC 07/21/2017: EF 25-35%; b.) TTE 07/23/2017: 30-35%; c.) TTE 10/23/2017: EF 40-45%; d.) TTE 11/19/2021: EF 45-50%   Left inguinal hernia    a.) s/p repair 05/19/2022   ST elevation myocardial infarction (STEMI) of inferior wall (HCC) 07/21/2017   a.) LHC 07/21/2017: EF  25-35%, LVEDP 14 mmHg. 40% pLCx, 40% mLCx, 100% dLCx, 100% pRCA --> attempted to cross wire at RCA lesion, however unable. Reasonable RCA collaterals already formed --> med mgmt.   T2DM (type 2 diabetes mellitus) (HCC)    Tobacco use    Past Surgical History:  Procedure Laterality Date   CATARACT EXTRACTION Bilateral    COLONOSCOPY WITH PROPOFOL N/A 04/04/2019   Procedure: COLONOSCOPY WITH PROPOFOL;  Surgeon: Pasty Spillers, MD;  Location: [facility];  Service: Endoscopy;  Laterality: N/A;   CYSTOSCOPY WITH FULGERATION N/A 05/14/2023   Procedure: CYSTOSCOPY WITH CLOT EVACUATION AND FULGERATION;  Surgeon: Vanna Scotland, MD;  Location: [facility];  Service: Urology;  Laterality: N/A;   HOLEP-LASER ENUCLEATION OF THE PROSTATE WITH MORCELLATION N/A 06/08/2023   Procedure: HOLEP-LASER ENUCLEATION OF THE PROSTATE WITH MORCELLATION;  Surgeon: Sondra Come, MD;  Location: [facility];  Service: Urology;  Laterality: N/A;   LEFT HEART CATH AND CORONARY ANGIOGRAPHY N/A 07/21/2017   Procedure: LEFT HEART CATH AND CORONARY ANGIOGRAPHY;  Surgeon: Iran Ouch, MD;  Location: [facility];  Service: Cardiovascular;  Laterality: N/A;   LUMBAR LAMINECTOMY/DECOMPRESSION MICRODISCECTOMY Bilateral 04/05/2023   Procedure: MINIMALLY INVASIVE (MIS) L2-3 LAMINECTOMY AND DISCECTOMY;  Surgeon: Loreen Freud, MD;  Location: [facility];  Service: Neurosurgery;  Laterality: Bilateral;  XI ROBOTIC ASSISTED INGUINAL HERNIA REPAIR WITH MESH Left 05/19/2022   Procedure: XI ROBOTIC ASSISTED INGUINAL HERNIA REPAIR WITH MESH;  Surgeon: Campbell Lerner, MD;  Location: [facility];  Service: General;  Laterality: Left;   Patient Active Problem List   Diagnosis Date Noted   S/P lumbar microdiscectomy 05/16/2023   Hematuria 05/12/2023   Acute urinary retention 05/12/2023   Bilateral hydronephrosis 05/12/2023   HTN (hypertension) 05/12/2023   HLD (hyperlipidemia) 05/12/2023   Type II diabetes mellitus  with renal manifestations (HCC) 05/12/2023   Chronic kidney disease, stage 3a (HCC) 05/12/2023   Chronic systolic CHF (congestive heart failure) (HCC) 05/12/2023   BPH (benign prostatic hyperplasia) 05/12/2023   UTI (urinary tract infection) 05/12/2023   CAD (coronary artery disease) 05/12/2023   Intervertebral lumbar disc disorder with myelopathy, lumbar region 04/02/2023   Impaired functional mobility, balance, gait, and endurance 03/26/2023   Bilateral leg weakness 03/26/2023   Abnormal prostate exam 05/09/2022   Aortic atherosclerosis (HCC) 02/24/2022   Smokes with greater than 30 pack year history 11/18/2021   Coronary artery disease of native artery of native heart with stable angina pectoris (HCC) 05/19/2021   Dyslipidemia 07/30/2020   Polyp of colon    Type 2 diabetes mellitus, controlled (HCC) 02/08/2018   History of myocardial infarction 11/08/2017   Tobacco use 11/08/2017   Ischemic cardiomyopathy 11/08/2017   CHF (congestive heart failure) (HCC) 11/08/2017   Foot callus 11/08/2017   Onychomycosis of multiple toenails with type 2 diabetes mellitus (HCC) 11/08/2017   Cataract, nuclear sclerotic, left eye 04/04/2016    PCP: Danelle Berry, PA-C  REFERRING PROVIDER: Oswaldo Conroy Mecum, PA-C  REFERRING DIAG:  M51.06 (ICD-10-CM) - Intervertebral lumbar disc disorder with myelopathy, lumbar region  Z74.09 (ICD-10-CM) - Impaired functional mobility, balance, gait, and endurance  R29.898 (ICD-10-CM) - Bilateral leg weakness  Z98.890 (ICD-10-CM) - S/P lumbar microdiscectomy    Rationale for Evaluation and Treatment: Rehabilitation  THERAPY DIAG:  Other lack of coordination  Difficulty in walking, not elsewhere classified  Unsteadiness on feet  Muscle weakness (generalized)  ONSET DATE: 03/2023  SUBJECTIVE:                                                                                                                                                                                            SUBJECTIVE STATEMENT:  Patient reports no changes since last date. Overall he has been doing well and sees progress.   PERTINENT HISTORY:  Approximately 6 weeks status post Right L2-L3 Laminectomy and microdiscectomy (04/05/23).  He states that he has graduated from his wheelchair and is almost only using his walker, trying to  graduate to his cane.  Patient has a past medical history of arthritis, congestive heart failure, CID, CDD, enlarged prostate, HTN, HLD, left inguinal hernia, type 2 diabetes. 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    PAIN:  Are you having pain? No  Current: 0/10, At worst: 3/10  Pt denies reports of radiating pain, localizes to one area    PRECAUTIONS: Back   He can increase his weight to 25 pounds  RED FLAGS: None   WEIGHT BEARING RESTRICTIONS:  No lifting more than 25 pounds   FALLS:  Has patient fallen in last 6 months? No  LIVING ENVIRONMENT: Lives with: lives alone Lives in: House/apartment Stairs: Yes: External: 3 steps; on left going up Has following equipment at home: Single point cane and Walker - 2 wheeled, grab bars in shower   OCCUPATION: Engineer, drilling   PLOF: Independent  PATIENT GOALS: "Get back to walking without a walker or cane"   NEXT MD VISIT: 06/08/23 : prostate procedure   OBJECTIVE:  Note: Objective measures were completed at Evaluation unless otherwise noted.  DIAGNOSTIC FINDINGS:  IMPRESSION from MRI 03/26/23 1. Severe spinal canal narrowing at L2-L3 secondary to a combination of ligamentum flavum hypertrophy, short pedicles, and a disc bulge. 2. Severe left and moderate right neural foraminal narrowing at L5-S1.   PATIENT SURVEYS:  FOTO 05/29/23: 38  Typical result : 51  SCREENING FOR RED FLAGS: Bowel or bladder incontinence: No, Yes for bladder due to prostate Spinal tumors: No Cauda equina syndrome:  No Compression fracture: No Abdominal aneurysm: No  COGNITION: Overall cognitive status: Within functional limits for tasks assessed     SENSATION: Light touch: WFL Coordination intact   MUSCLE LENGTH: Hamstrings: Right WNL deg; Left 56 deg   POSTURE: rounded shoulders and increased thoracic kyphosis  PALPATION: Tightness of bilateral lumbar paraspinals and quadratus lumborum  Incision neat, healing well.   LUMBAR ROM:   Not tested due to precautions  AROM eval  Flexion   Extension   Right lateral flexion   Left lateral flexion   Right rotation   Left rotation    (Blank rows = not tested)   LOWER EXTREMITY MMT:    MMT Right eval Left eval  Hip flexion 4-/5 4-/5  Hip extension    Hip abduction 4 4  Hip adduction 4- 4  Hip internal rotation    Hip external rotation    Knee flexion 4-/5 4-/5  Knee extension 4+ 4+  Ankle dorsiflexion 4/5 4/5  Ankle plantarflexion 4-/5 4-/5  Ankle inversion    Ankle eversion     (Blank rows = not tested)  LUMBAR SPECIAL TESTS:  Deferred due to precautions Hip special tests: FABER and Scour negative SLR : negative   FUNCTIONAL TESTS:  5 times sit to stand: 16.59s intermittent hand use 10 meter walk test: 20.97  .91m/s with walker   GAIT: Distance walked: 35ft  Assistive device utilized: Environmental consultant - 2 wheeled Level of assistance: CGA Comments: External rotation at the hips, kyphotic posture, poor foot clearance bilateral with L> R. Decreased heel strike with foot flat noted bilaterally  TODAY'S TREATMENT: DATE: 07/16/23   TherEx   Nustep progressive intervals level 1 x 1 min, level 5 x 6 min cues to prevent GR with terminal knee extension.   Gait with SPC through gym x 46ft with supervision assist from PT; noted to have reduced ataxia compared to prior sessions with use  of SPC.   Gait WITHOUT AD loop around leg press CW/CWW 60ft x 2 with cues for safety in turns and reciprocal pattern  Forward/reverse 2 x 10 ft  bil  Side stepping R and L 2 x 61ft bil  Y stepping in taps 2 x 10 bil with cues for posture, knee flexion in stance and improved foot clearance.  Throughout session, PT provided CGA for safety with tactile cues for weight shifting to allow adequate step height and reduce foot drag.   Multiple therapeutic rest breaks throughout session due to BLE weakness. No pain reported throughout session.    PATIENT EDUCATION:  Education details: HEP, POC, goals  Person educated: Patient Education method: Explanation, Demonstration, and Handouts Education comprehension: verbalized understanding and returned demonstration  HOME EXERCISE PROGRAM: Access Code: [email or web address] URL: [email or web address] Date: 05/29/2023 Prepared by: Precious Bard  Exercises - Standing March with Counter Support  - 1 x daily - 7 x weekly - 2 sets - 10 reps - 5 hold - Heel Raises with Counter Support  - 1 x daily - 7 x weekly - 2 sets - 10 reps - 5 hold - Sit to Stand Without Arm Support  - 1 x daily - 7 x weekly - 2 sets - 10 reps - 5 hold  ASSESSMENT:  CLINICAL IMPRESSION:  Patient arrived with good motivation form completion of pt activities.  Pt progresses with dynamic gait and gait training without AD on this day. Improved coordination in forward, gait, but ataxia still present in gait in all other planes. Pt will continue to benefit from skilled physical therapy intervention to address impairments, improve QOL, and attain therapy goals.    OBJECTIVE IMPAIRMENTS: Abnormal gait, decreased activity tolerance, decreased balance, decreased endurance, decreased mobility, difficulty walking, decreased strength, impaired flexibility, and improper body mechanics.   ACTIVITY LIMITATIONS: carrying, lifting, bending, standing, squatting, stairs, transfers, bed mobility, dressing, and locomotion level  PARTICIPATION LIMITATIONS: meal prep, cleaning, laundry, driving, shopping, occupation, and yard work  PERSONAL  FACTORS: arthritis, congestive heart failure, CID, CDD, enlarged prostate, HTN, HLD, left inguinal hernia, type 2 diabetes are also affecting patient's functional outcome.   REHAB POTENTIAL: Good  CLINICAL DECISION MAKING: Evolving/moderate complexity  EVALUATION COMPLEXITY: Moderate   GOALS: Goals reviewed with patient? Yes  SHORT TERM GOALS: Target date: 06/26/2023  Patient will be independent in home exercise program to improve strength/mobility for better functional independence with ADLs. Baseline: 05/29/23: HEP given ; 07/09/2023= Patient reports compliance with HEP to date and no questions.  Goal status: MET   LONG TERM GOALS: Target date: 07/24/23  Patient will increase 10 meter walk test to >1.53m/s as to improve gait speed for better community ambulation and to reduce fall risk. Baseline: .47 m/s; 07/09/2023= 0.58 m/s with and without cane Goal status: PROGRESSING  2.  Patient will increase FOTO score to equal to or greater than 51 to demonstrate statistically significant improvement in mobility and quality of life. Baseline: 05/29/23: 38; 07/09/2023= 49 Goal status: PROGRESSING  3.  Patient (> 20 years old) will complete five times sit to stand test in < 15 seconds indicating an increased LE strength and improved balance. Baseline: 16.59 Intermittent hand use; 07/09/2023= 18.45 sec without UE support  Goal status: PROGRESSING  4. Patient will ambulate 150 ft  without the use of an assistive device in order to increase independence and improve quality of life.  Baseline: Not tested  Goal status: INITIAL  5.  Patient will increase Berg Balance score by >  6 points to demonstrate decreased fall risk during functional activities. Baseline: awaiting testing until precautions are lifted; 07/10/2023= 41/56 Goal status: INITIAL     PLAN:  PT FREQUENCY: 1-2x/week  PT DURATION: 8 weeks  PLANNED INTERVENTIONS: Therapeutic exercises, Therapeutic activity, Neuromuscular  re-education, Balance training, Gait training, Patient/Family education, Self Care, Joint mobilization, Stair training, Vestibular training, Canalith repositioning, DME instructions, Dry Needling, Electrical stimulation, Cryotherapy, Moist heat, scar mobilization, Taping, Manual therapy, and Re-evaluation.  PLAN FOR NEXT SESSION:   Coordination, balance and BLE strengthening.  Functional gait training.   Golden Pop PT ,DPT Physical Therapist- [facility]   2:39 PM 07/16/23

## 2023-07-18 ENCOUNTER — Ambulatory Visit: Payer: 59

## 2023-07-18 ENCOUNTER — Other Ambulatory Visit: Payer: Self-pay | Admitting: Cardiovascular Disease

## 2023-07-18 DIAGNOSIS — R262 Difficulty in walking, not elsewhere classified: Secondary | ICD-10-CM

## 2023-07-18 DIAGNOSIS — R278 Other lack of coordination: Secondary | ICD-10-CM

## 2023-07-18 DIAGNOSIS — M6281 Muscle weakness (generalized): Secondary | ICD-10-CM | POA: Diagnosis not present

## 2023-07-18 DIAGNOSIS — R2681 Unsteadiness on feet: Secondary | ICD-10-CM

## 2023-07-18 NOTE — Therapy (Signed)
OUTPATIENT PHYSICAL THERAPY THORACOLUMBAR and NEURO TREATMENT   Patient Name: Lawrence Holmes MRN: [id number] DOB:Jan 04, 1956, 67 y.o., male Today's Date: 07/18/2023  END OF SESSION:  PT End of Session - 07/18/23 1318     Visit Number 13    Number of Visits 16    Date for PT Re-Evaluation 07/24/23    Progress Note Due on Visit 20    PT Start Time 1317    PT Stop Time 1357    PT Time Calculation (min) 40 min    Equipment Utilized During Treatment Gait belt    Activity Tolerance Patient tolerated treatment well;No increased pain    Behavior During Therapy [facility] for tasks assessed/performed                Past Medical History:  Diagnosis Date   Aortic atherosclerosis (HCC)    Arthritis    BPH with urinary obstruction    Chronic systolic heart failure (HCC) 07/23/2017   a.) TTE 07/23/2017: EF 30-35%, mild LVH, inferolateral and inferior AK, mild BAE, G1DD; b.) TTE 10/23/2017: EF 40-45%, mild LVH, basal-mid inferior and inferoseptal AK, mild MR; c.) TTE 11/19/2021: EF 45-50%, glob HK with mod to severe inferior and basal to mid septal HK, GLS -15.3%, mild MR, G1DD.   Coronary artery disease 07/21/2017   a. 07/2017 Late presenting inferior STEMI/Cath: LM nl, LAD min irregs, LCX 100d (L->L collats), RCA 100p (L->R collats), EF 25-35%-->Med Rx; b. 10/2017 ETT (DOT): Ex time 7:38, baseline antlat TWI, no acute changes; c. 09/2019 ETT: Ex time 6:49. No ECG changes.   DDD (degenerative disc disease), lumbar    a.) s/p MIS L2-3 laminectomy and disectomy   Diverticulosis of colon    Erectile dysfunction    HTN (hypertension)    Hx of bilateral cataract extraction    Hyperlipidemia    Ischemic cardiomyopathy    a.) LHC 07/21/2017: EF 25-35%; b.) TTE 07/23/2017: 30-35%; c.) TTE 10/23/2017: EF 40-45%; d.) TTE 11/19/2021: EF 45-50%   Left inguinal hernia    a.) s/p repair 05/19/2022   ST elevation myocardial infarction (STEMI) of inferior wall (HCC) 07/21/2017   a.) LHC 07/21/2017: EF  25-35%, LVEDP 14 mmHg. 40% pLCx, 40% mLCx, 100% dLCx, 100% pRCA --> attempted to cross wire at RCA lesion, however unable. Reasonable RCA collaterals already formed --> med mgmt.   T2DM (type 2 diabetes mellitus) (HCC)    Tobacco use    Past Surgical History:  Procedure Laterality Date   CATARACT EXTRACTION Bilateral    COLONOSCOPY WITH PROPOFOL N/A 04/04/2019   Procedure: COLONOSCOPY WITH PROPOFOL;  Surgeon: Pasty Spillers, MD;  Location: [facility] ENDOSCOPY;  Service: Endoscopy;  Laterality: N/A;   CYSTOSCOPY WITH FULGERATION N/A 05/14/2023   Procedure: CYSTOSCOPY WITH CLOT EVACUATION AND FULGERATION;  Surgeon: Vanna Scotland, MD;  Location: [facility] ORS;  Service: Urology;  Laterality: N/A;   HOLEP-LASER ENUCLEATION OF THE PROSTATE WITH MORCELLATION N/A 06/08/2023   Procedure: HOLEP-LASER ENUCLEATION OF THE PROSTATE WITH MORCELLATION;  Surgeon: Sondra Come, MD;  Location: [facility] ORS;  Service: Urology;  Laterality: N/A;   LEFT HEART CATH AND CORONARY ANGIOGRAPHY N/A 07/21/2017   Procedure: LEFT HEART CATH AND CORONARY ANGIOGRAPHY;  Surgeon: Iran Ouch, MD;  Location: [facility] INVASIVE CV [facility];  Service: Cardiovascular;  Laterality: N/A;   LUMBAR LAMINECTOMY/DECOMPRESSION MICRODISCECTOMY Bilateral 04/05/2023   Procedure: MINIMALLY INVASIVE (MIS) L2-3 LAMINECTOMY AND DISCECTOMY;  Surgeon: Loreen Freud, MD;  Location: [facility] ORS;  Service: Neurosurgery;  Laterality: Bilateral;  XI ROBOTIC ASSISTED INGUINAL HERNIA REPAIR WITH MESH Left 05/19/2022   Procedure: XI ROBOTIC ASSISTED INGUINAL HERNIA REPAIR WITH MESH;  Surgeon: Campbell Lerner, MD;  Location: [facility] ORS;  Service: General;  Laterality: Left;   Patient Active Problem List   Diagnosis Date Noted   S/P lumbar microdiscectomy 05/16/2023   Hematuria 05/12/2023   Acute urinary retention 05/12/2023   Bilateral hydronephrosis 05/12/2023   HTN (hypertension) 05/12/2023   HLD (hyperlipidemia) 05/12/2023   Type II diabetes mellitus  with renal manifestations (HCC) 05/12/2023   Chronic kidney disease, stage 3a (HCC) 05/12/2023   Chronic systolic CHF (congestive heart failure) (HCC) 05/12/2023   BPH (benign prostatic hyperplasia) 05/12/2023   UTI (urinary tract infection) 05/12/2023   CAD (coronary artery disease) 05/12/2023   Intervertebral lumbar disc disorder with myelopathy, lumbar region 04/02/2023   Impaired functional mobility, balance, gait, and endurance 03/26/2023   Bilateral leg weakness 03/26/2023   Abnormal prostate exam 05/09/2022   Aortic atherosclerosis (HCC) 02/24/2022   Smokes with greater than 30 pack year history 11/18/2021   Coronary artery disease of native artery of native heart with stable angina pectoris (HCC) 05/19/2021   Dyslipidemia 07/30/2020   Polyp of colon    Type 2 diabetes mellitus, controlled (HCC) 02/08/2018   History of myocardial infarction 11/08/2017   Tobacco use 11/08/2017   Ischemic cardiomyopathy 11/08/2017   CHF (congestive heart failure) (HCC) 11/08/2017   Foot callus 11/08/2017   Onychomycosis of multiple toenails with type 2 diabetes mellitus (HCC) 11/08/2017   Cataract, nuclear sclerotic, left eye 04/04/2016    PCP: Danelle Berry, PA-C  REFERRING PROVIDER: Oswaldo Conroy Mecum, PA-C  REFERRING DIAG:  M51.06 (ICD-10-CM) - Intervertebral lumbar disc disorder with myelopathy, lumbar region  Z74.09 (ICD-10-CM) - Impaired functional mobility, balance, gait, and endurance  R29.898 (ICD-10-CM) - Bilateral leg weakness  Z98.890 (ICD-10-CM) - S/P lumbar microdiscectomy    Rationale for Evaluation and Treatment: Rehabilitation  THERAPY DIAG:  Other lack of coordination  Difficulty in walking, not elsewhere classified  Unsteadiness on feet  Muscle weakness (generalized)  ONSET DATE: 03/2023  SUBJECTIVE:                                                                                                                                                                                            SUBJECTIVE STATEMENT:  Pt stated that he is feeling well today. Pt reported 0/10 on NPS and no falls since last visit.   PERTINENT HISTORY:  Approximately 6 weeks status post Right L2-L3 Laminectomy and microdiscectomy (04/05/23).  He states that he has graduated from his wheelchair and is almost only using his  walker, trying to graduate to his cane.  Patient has a past medical history of arthritis, congestive heart failure, CID, CDD, enlarged prostate, HTN, HLD, left inguinal hernia, type 2 diabetes. 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    PAIN:  Are you having pain? No  Current: 0/10, At worst: 3/10  Pt denies reports of radiating pain, localizes to one area    PRECAUTIONS: Back   He can increase his weight to 25 pounds  RED FLAGS: None   WEIGHT BEARING RESTRICTIONS:  No lifting more than 25 pounds   FALLS:  Has patient fallen in last 6 months? No  LIVING ENVIRONMENT: Lives with: lives alone Lives in: House/apartment Stairs: Yes: External: 3 steps; on left going up Has following equipment at home: Single point cane and Walker - 2 wheeled, grab bars in shower   OCCUPATION: Engineer, drilling   PLOF: Independent  PATIENT GOALS: "Get back to walking without a walker or cane"   NEXT MD VISIT: 06/08/23 : prostate procedure   OBJECTIVE:  Note: Objective measures were completed at Evaluation unless otherwise noted.  DIAGNOSTIC FINDINGS:  IMPRESSION from MRI 03/26/23 1. Severe spinal canal narrowing at L2-L3 secondary to a combination of ligamentum flavum hypertrophy, short pedicles, and a disc bulge. 2. Severe left and moderate right neural foraminal narrowing at L5-S1.   PATIENT SURVEYS:  FOTO 05/29/23: 38  Typical result : 51  SCREENING FOR RED FLAGS: Bowel or bladder incontinence: No, Yes for bladder due to prostate Spinal tumors: No Cauda equina syndrome:  No Compression fracture: No Abdominal aneurysm: No  COGNITION: Overall cognitive status: Within functional limits for tasks assessed     SENSATION: Light touch: WFL Coordination intact   MUSCLE LENGTH: Hamstrings: Right WNL deg; Left 56 deg   POSTURE: rounded shoulders and increased thoracic kyphosis  PALPATION: Tightness of bilateral lumbar paraspinals and quadratus lumborum  Incision neat, healing well.   LUMBAR ROM:   Not tested due to precautions  AROM eval  Flexion   Extension   Right lateral flexion   Left lateral flexion   Right rotation   Left rotation    (Blank rows = not tested)   LOWER EXTREMITY MMT:    MMT Right eval Left eval  Hip flexion 4-/5 4-/5  Hip extension    Hip abduction 4 4  Hip adduction 4- 4  Hip internal rotation    Hip external rotation    Knee flexion 4-/5 4-/5  Knee extension 4+ 4+  Ankle dorsiflexion 4/5 4/5  Ankle plantarflexion 4-/5 4-/5  Ankle inversion    Ankle eversion     (Blank rows = not tested)  LUMBAR SPECIAL TESTS:  Deferred due to precautions Hip special tests: FABER and Scour negative SLR : negative   FUNCTIONAL TESTS:  5 times sit to stand: 16.59s intermittent hand use 10 meter walk test: 20.97  .78m/s with walker   GAIT: Distance walked: 48ft  Assistive device utilized: Environmental consultant - 2 wheeled Level of assistance: CGA Comments: External rotation at the hips, kyphotic posture, poor foot clearance bilateral with L> R. Decreased heel strike with foot flat noted bilaterally  TODAY'S TREATMENT: DATE: 07/18/23   TherEx  Nustep progressive intervals:  Level 1 x 1 min Level 3 x 1 min Level 5 x 1 min Level 2 x 1 min  Level 4 x 1 min Level 6 x 1 min Level 1 x 1 min  STS without UE  support (progressive starting with 4 pads on chair then progress by taking away a pad x 5 resp then 5 more without padding)     NMR Sidestepping GTB without UE support x 2 reps each direction in // bars    Sidestepping  without UE support x 1 rep each direction in // bars  Obstacle course forward ambulation in // bars stepping over foam noodle, orange hurdle, and foam blocks x 8 reps with UE support as needed   Obstacle course lateral ambulation in // bars stepping over foam noodle, orange hurdle, and foam vertically placed yoga blocks x 8 reps with UE support as needed   Ambulation forwards with horizontal head turns 50 feet  Gait in hallway forwards/backwards/lateral per VC's from PT x 2 minutes  *Multiple therapeutic rest breaks throughout session due to BLE weakness. No pain reported throughout session.    PATIENT EDUCATION:  Education details: HEP, POC, goals  Person educated: Patient Education method: Explanation, Demonstration, and Handouts Education comprehension: verbalized understanding and returned demonstration  HOME EXERCISE PROGRAM: Access Code: [email or web address] URL: [email or web address] Date: 05/29/2023 Prepared by: Precious Bard  Exercises - Standing March with Counter Support  - 1 x daily - 7 x weekly - 2 sets - 10 reps - 5 hold - Heel Raises with Counter Support  - 1 x daily - 7 x weekly - 2 sets - 10 reps - 5 hold - Sit to Stand Without Arm Support  - 1 x daily - 7 x weekly - 2 sets - 10 reps - 5 hold  ASSESSMENT:  CLINICAL IMPRESSION:  Pt tolerated all tasks during today's visit. Pt demonstrated no LOB with directional changes in ambulation, but mild delay in reaction time when changing directions. Pt felt fatigued, but accomplished after today's visit. Pt will continue to benefit from skilled physical therapy intervention to address impairments, improve QOL, and attain therapy goals.    OBJECTIVE IMPAIRMENTS: Abnormal gait, decreased activity tolerance, decreased balance, decreased endurance, decreased mobility, difficulty walking, decreased strength, impaired flexibility, and improper body mechanics.   ACTIVITY LIMITATIONS: carrying, lifting, bending, standing,  squatting, stairs, transfers, bed mobility, dressing, and locomotion level  PARTICIPATION LIMITATIONS: meal prep, cleaning, laundry, driving, shopping, occupation, and yard work  PERSONAL FACTORS: arthritis, congestive heart failure, CID, CDD, enlarged prostate, HTN, HLD, left inguinal hernia, type 2 diabetes are also affecting patient's functional outcome.   REHAB POTENTIAL: Good  CLINICAL DECISION MAKING: Evolving/moderate complexity  EVALUATION COMPLEXITY: Moderate   GOALS: Goals reviewed with patient? Yes  SHORT TERM GOALS: Target date: 06/26/2023  Patient will be independent in home exercise program to improve strength/mobility for better functional independence with ADLs. Baseline: 05/29/23: HEP given ; 07/09/2023= Patient reports compliance with HEP to date and no questions.  Goal status: MET   LONG TERM GOALS: Target date: 07/24/23  Patient will increase 10 meter walk test to >1.38m/s as to improve gait speed for better community ambulation and to reduce fall risk. Baseline: .47 m/s; 07/09/2023= 0.58 m/s with and without cane Goal status: PROGRESSING  2.  Patient will increase FOTO score to equal to or greater than 51 to demonstrate statistically significant improvement in mobility and quality of life. Baseline: 05/29/23: 38; 07/09/2023= 49 Goal status: PROGRESSING  3.  Patient (> 14 years old) will complete five times sit to stand test in < 15 seconds indicating an increased LE strength and improved balance. Baseline: 16.59 Intermittent hand use; 07/09/2023= 18.45 sec without UE support  Goal status: PROGRESSING  4. Patient  will ambulate 150 ft  without the use of an assistive device in order to increase independence and improve quality of life.  Baseline: Not tested  Goal status: INITIAL  5.  Patient will increase Berg Balance score by > 6 points to demonstrate decreased fall risk during functional activities. Baseline: awaiting testing until precautions are lifted;  07/10/2023= 41/56 Goal status: INITIAL     PLAN:  PT FREQUENCY: 1-2x/week  PT DURATION: 8 weeks  PLANNED INTERVENTIONS: Therapeutic exercises, Therapeutic activity, Neuromuscular re-education, Balance training, Gait training, Patient/Family education, Self Care, Joint mobilization, Stair training, Vestibular training, Canalith repositioning, DME instructions, Dry Needling, Electrical stimulation, Cryotherapy, Moist heat, scar mobilization, Taping, Manual therapy, and Re-evaluation.  PLAN FOR NEXT SESSION:   Coordination, balance and BLE strengthening.  Functional gait training.   Debara Pickett, SPT This entire session was performed under direct supervision and direction of a licensed Estate agent . I have personally read, edited and approve of the note as written.   Lenda Kelp PT  Physical Therapist- [facility]   1:57 PM 07/18/23

## 2023-07-23 ENCOUNTER — Ambulatory Visit: Payer: 59 | Attending: Physician Assistant

## 2023-07-23 DIAGNOSIS — R262 Difficulty in walking, not elsewhere classified: Secondary | ICD-10-CM | POA: Diagnosis present

## 2023-07-23 DIAGNOSIS — R278 Other lack of coordination: Secondary | ICD-10-CM | POA: Insufficient documentation

## 2023-07-23 DIAGNOSIS — R2681 Unsteadiness on feet: Secondary | ICD-10-CM | POA: Insufficient documentation

## 2023-07-23 DIAGNOSIS — M6281 Muscle weakness (generalized): Secondary | ICD-10-CM | POA: Insufficient documentation

## 2023-07-23 NOTE — Therapy (Signed)
OUTPATIENT PHYSICAL THERAPY THORACOLUMBAR and NEURO TREATMENT   Patient Name: Lawrence Holmes MRN: [id number] DOB:[birth year], 67 y.o., male Today's Date: 07/23/2023  END OF SESSION:  PT End of Session - 07/23/23 0923     Visit Number 14    Number of Visits 16    Date for PT Re-Evaluation 07/24/23    Progress Note Due on Visit 20    PT Start Time 0930    PT Stop Time 1012    PT Time Calculation (min) 42 min    Equipment Utilized During Treatment Gait belt    Activity Tolerance Patient tolerated treatment well;No increased pain    Behavior During Therapy [facility] for tasks assessed/performed                 Past Medical History:  Diagnosis Date   Aortic atherosclerosis (HCC)    Arthritis    BPH with urinary obstruction    Chronic systolic heart failure (HCC) 07/23/2017   a.) TTE 07/23/2017: EF 30-35%, mild LVH, inferolateral and inferior AK, mild BAE, G1DD; b.) TTE 10/23/2017: EF 40-45%, mild LVH, basal-mid inferior and inferoseptal AK, mild MR; c.) TTE 11/19/2021: EF 45-50%, glob HK with mod to severe inferior and basal to mid septal HK, GLS -15.3%, mild MR, G1DD.   Coronary artery disease 07/21/2017   a. 07/2017 Late presenting inferior STEMI/Cath: LM nl, LAD min irregs, LCX 100d (L->L collats), RCA 100p (L->R collats), EF 25-35%-->Med Rx; b. 10/2017 ETT (DOT): Ex time 7:38, baseline antlat TWI, no acute changes; c. 09/2019 ETT: Ex time 6:49. No ECG changes.   DDD (degenerative disc disease), lumbar    a.) s/p MIS L2-3 laminectomy and disectomy   Diverticulosis of colon    Erectile dysfunction    HTN (hypertension)    Hx of bilateral cataract extraction    Hyperlipidemia    Ischemic cardiomyopathy    a.) LHC 07/21/2017: EF 25-35%; b.) TTE 07/23/2017: 30-35%; c.) TTE 10/23/2017: EF 40-45%; d.) TTE 11/19/2021: EF 45-50%   Left inguinal hernia    a.) s/p repair 05/19/2022   ST elevation myocardial infarction (STEMI) of inferior wall (HCC) 07/21/2017   a.) LHC 07/21/2017: EF  25-35%, LVEDP 14 mmHg. 40% pLCx, 40% mLCx, 100% dLCx, 100% pRCA --> attempted to cross wire at RCA lesion, however unable. Reasonable RCA collaterals already formed --> med mgmt.   T2DM (type 2 diabetes mellitus) (HCC)    Tobacco use    Past Surgical History:  Procedure Laterality Date   CATARACT EXTRACTION Bilateral    COLONOSCOPY WITH PROPOFOL N/A 04/04/2019   Procedure: COLONOSCOPY WITH PROPOFOL;  Surgeon: Pasty Spillers, MD;  Location: [facility];  Service: Endoscopy;  Laterality: N/A;   CYSTOSCOPY WITH FULGERATION N/A 05/14/2023   Procedure: CYSTOSCOPY WITH CLOT EVACUATION AND FULGERATION;  Surgeon: Vanna Scotland, MD;  Location: [facility];  Service: Urology;  Laterality: N/A;   HOLEP-LASER ENUCLEATION OF THE PROSTATE WITH MORCELLATION N/A 06/08/2023   Procedure: HOLEP-LASER ENUCLEATION OF THE PROSTATE WITH MORCELLATION;  Surgeon: Sondra Come, MD;  Location: [facility] OR[facility];  Service: Urology;  Laterality: N/A;   LEFT HEART CATH AND CORONARY ANGIOGRAPHY N/A 07/21/2017   Procedure: LEFT HEART CATH AND CORONARY ANGIOGRAPHY;  Surgeon: Iran Ouch, MD;  Location: [facility];  Service: Cardiovascular;  Laterality: N/A;   LUMBAR LAMINECTOMY/DECOMPRESSION MICRODISCECTOMY Bilateral 04/05/2023   Procedure: MINIMALLY INVASIVE (MIS) L2-3 LAMINECTOMY AND DISCECTOMY;  Surgeon: Loreen Freud, MD;  Location: [facility] OR[facility];  Service: Neurosurgery;  Laterality: Bilateral;  XI ROBOTIC ASSISTED INGUINAL HERNIA REPAIR WITH MESH Left 05/19/2022   Procedure: XI ROBOTIC ASSISTED INGUINAL HERNIA REPAIR WITH MESH;  Surgeon: Campbell Lerner, MD;  Location: [facility];  Service: General;  Laterality: Left;   Patient Active Problem List   Diagnosis Date Noted   S/P lumbar microdiscectomy 05/16/2023   Hematuria 05/12/2023   Acute urinary retention 05/12/2023   Bilateral hydronephrosis 05/12/2023   HTN (hypertension) 05/12/2023   HLD (hyperlipidemia) 05/12/2023   Type II diabetes mellitus  with renal manifestations (HCC) 05/12/2023   Chronic kidney disease, stage 3a (HCC) 05/12/2023   Chronic systolic CHF (congestive heart failure) (HCC) 05/12/2023   BPH (benign prostatic hyperplasia) 05/12/2023   UTI (urinary tract infection) 05/12/2023   CAD (coronary artery disease) 05/12/2023   Intervertebral lumbar disc disorder with myelopathy, lumbar region 04/02/2023   Impaired functional mobility, balance, gait, and endurance 03/26/2023   Bilateral leg weakness 03/26/2023   Abnormal prostate exam 05/09/2022   Aortic atherosclerosis (HCC) 02/24/2022   Smokes with greater than 30 pack year history 11/18/2021   Coronary artery disease of native artery of native heart with stable angina pectoris (HCC) 05/19/2021   Dyslipidemia 07/30/2020   Polyp of colon    Type 2 diabetes mellitus, controlled (HCC) 02/08/2018   History of myocardial infarction 11/08/2017   Tobacco use 11/08/2017   Ischemic cardiomyopathy 11/08/2017   CHF (congestive heart failure) (HCC) 11/08/2017   Foot callus 11/08/2017   Onychomycosis of multiple toenails with type 2 diabetes mellitus (HCC) 11/08/2017   Cataract, nuclear sclerotic, left eye 04/04/2016    PCP: Danelle Berry, PA-C  REFERRING PROVIDER: Oswaldo Conroy Mecum, PA-C  REFERRING DIAG:  M51.06 (ICD-10-CM) - Intervertebral lumbar disc disorder with myelopathy, lumbar region  Z74.09 (ICD-10-CM) - Impaired functional mobility, balance, gait, and endurance  R29.898 (ICD-10-CM) - Bilateral leg weakness  Z98.890 (ICD-10-CM) - S/P lumbar microdiscectomy    Rationale for Evaluation and Treatment: Rehabilitation  THERAPY DIAG:  Other lack of coordination  Difficulty in walking, not elsewhere classified  Unsteadiness on feet  Muscle weakness (generalized)  ONSET DATE: 03/2023  SUBJECTIVE:                                                                                                                                                                                            SUBJECTIVE STATEMENT:   Patient reports feeling well this AM. Denies falls since last session. No pain on arrival.   PERTINENT HISTORY:  Approximately 6 weeks status post Right L2-L3 Laminectomy and microdiscectomy (04/05/23).  He states that he has graduated from his wheelchair and is almost only using his Tametria Aho, trying to  graduate to his cane.  Patient has a past medical history of arthritis, congestive heart failure, CID, CDD, enlarged prostate, HTN, HLD, left inguinal hernia, type 2 diabetes. 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    PAIN:  Are you having pain? No  Current: 0/10, At worst: 3/10  Pt denies reports of radiating pain, localizes to one area    PRECAUTIONS: Back   He can increase his weight to 25 pounds  RED FLAGS: None   WEIGHT BEARING RESTRICTIONS:  No lifting more than 25 pounds   FALLS:  Has patient fallen in last 6 months? No  LIVING ENVIRONMENT: Lives with: lives alone Lives in: House/apartment Stairs: Yes: External: 3 steps; on left going up Has following equipment at home: Single point cane and Mariko Nowakowski - 2 wheeled, grab bars in shower   OCCUPATION: Engineer, drilling   PLOF: Independent  PATIENT GOALS: "Get back to walking without a Sadie Hazelett or cane"   NEXT MD VISIT: 06/08/23 : prostate procedure   OBJECTIVE:  Note: Objective measures were completed at Evaluation unless otherwise noted.  DIAGNOSTIC FINDINGS:  IMPRESSION from MRI 03/26/23 1. Severe spinal canal narrowing at L2-L3 secondary to a combination of ligamentum flavum hypertrophy, short pedicles, and a disc bulge. 2. Severe left and moderate right neural foraminal narrowing at L5-S1.   PATIENT SURVEYS:  FOTO 05/29/23: 38  Typical result : 51  SCREENING FOR RED FLAGS: Bowel or bladder incontinence: No, Yes for bladder due to prostate Spinal tumors: No Cauda equina syndrome:  No Compression fracture: No Abdominal aneurysm: No  COGNITION: Overall cognitive status: Within functional limits for tasks assessed     SENSATION: Light touch: WFL Coordination intact   MUSCLE LENGTH: Hamstrings: Right WNL deg; Left 56 deg   POSTURE: rounded shoulders and increased thoracic kyphosis  PALPATION: Tightness of bilateral lumbar paraspinals and quadratus lumborum  Incision neat, healing well.   LUMBAR ROM:   Not tested due to precautions  AROM eval  Flexion   Extension   Right lateral flexion   Left lateral flexion   Right rotation   Left rotation    (Blank rows = not tested)   LOWER EXTREMITY MMT:    MMT Right eval Left eval  Hip flexion 4-/5 4-/5  Hip extension    Hip abduction 4 4  Hip adduction 4- 4  Hip internal rotation    Hip external rotation    Knee flexion 4-/5 4-/5  Knee extension 4+ 4+  Ankle dorsiflexion 4/5 4/5  Ankle plantarflexion 4-/5 4-/5  Ankle inversion    Ankle eversion     (Blank rows = not tested)  LUMBAR SPECIAL TESTS:  Deferred due to precautions Hip special tests: FABER and Scour negative SLR : negative   FUNCTIONAL TESTS:  5 times sit to stand: 16.59s intermittent hand use 10 meter walk test: 20.97  .34m/s with Deklyn Gibbon   GAIT: Distance walked: 44ft  Assistive device utilized: Environmental consultant - 2 wheeled Level of assistance: CGA Comments: External rotation at the hips, kyphotic posture, poor foot clearance bilateral with L> R. Decreased heel strike with foot flat noted bilaterally  TODAY'S TREATMENT: DATE: 07/23/23    TherEx  Nustep progressive intervals:  Level 2 x 1 min Level 4 x 1 min Level 5 x 2 min Level 6 x 1 min Level 3 x 1 min  STS without UE support, sitting on airex x 10, without airex x 10   Standing  marching with 4# AW 2 x 10 each LE  Standing hip abduction with 4# AW 2 x 10 each LE    NMR Alternating 6" step taps 2 x 10 each LE - CGA required  Step ups onto airex x 10 leading with each  LE and intermittent UE support - up to minA required for balance  *Multiple therapeutic rest breaks throughout session due to BLE weakness. No pain reported throughout session.    PATIENT EDUCATION:  Education details: HEP, POC, goals  Person educated: Patient Education method: Explanation, Demonstration, and Handouts Education comprehension: verbalized understanding and returned demonstration  HOME EXERCISE PROGRAM: Access Code: [email or web address] URL: [email or web address] Date: 05/29/2023 Prepared by: Precious Bard  Exercises - Standing March with Counter Support  - 1 x daily - 7 x weekly - 2 sets - 10 reps - 5 hold - Heel Raises with Counter Support  - 1 x daily - 7 x weekly - 2 sets - 10 reps - 5 hold - Sit to Stand Without Arm Support  - 1 x daily - 7 x weekly - 2 sets - 10 reps - 5 hold  ASSESSMENT:  CLINICAL IMPRESSION:   Patient arrives to treatment session motivated to participate with no new complaints. Session focused on balance and BLE strengthening. Tolerated treatment session well. Required CGA-minA throughout for balance. Pt will continue to benefit from skilled physical therapy intervention to address impairments, improve QOL, and attain therapy goals.    OBJECTIVE IMPAIRMENTS: Abnormal gait, decreased activity tolerance, decreased balance, decreased endurance, decreased mobility, difficulty walking, decreased strength, impaired flexibility, and improper body mechanics.   ACTIVITY LIMITATIONS: carrying, lifting, bending, standing, squatting, stairs, transfers, bed mobility, dressing, and locomotion level  PARTICIPATION LIMITATIONS: meal prep, cleaning, laundry, driving, shopping, occupation, and yard work  PERSONAL FACTORS: arthritis, congestive heart failure, CID, CDD, enlarged prostate, HTN, HLD, left inguinal hernia, type 2 diabetes are also affecting patient's functional outcome.   REHAB POTENTIAL: Good  CLINICAL DECISION MAKING: Evolving/moderate  complexity  EVALUATION COMPLEXITY: Moderate   GOALS: Goals reviewed with patient? Yes  SHORT TERM GOALS: Target date: 06/26/2023  Patient will be independent in home exercise program to improve strength/mobility for better functional independence with ADLs. Baseline: 05/29/23: HEP given ; 07/09/2023= Patient reports compliance with HEP to date and no questions.  Goal status: MET   LONG TERM GOALS: Target date: 07/24/23  Patient will increase 10 meter walk test to >1.73m/s as to improve gait speed for better community ambulation and to reduce fall risk. Baseline: .47 m/s; 07/09/2023= 0.58 m/s with and without cane Goal status: PROGRESSING  2.  Patient will increase FOTO score to equal to or greater than 51 to demonstrate statistically significant improvement in mobility and quality of life. Baseline: 05/29/23: 38; 07/09/2023= 49 Goal status: PROGRESSING  3.  Patient (> 70 years old) will complete five times sit to stand test in < 15 seconds indicating an increased LE strength and improved balance. Baseline: 16.59 Intermittent hand use; 07/09/2023= 18.45 sec without UE support  Goal status: PROGRESSING  4. Patient will ambulate 150 ft  without the use of an assistive device in order to increase independence and improve quality of life.  Baseline: Not tested  Goal status: INITIAL  5.  Patient will increase Berg Balance score by > 6 points to demonstrate decreased fall risk during functional activities. Baseline: awaiting testing until precautions are lifted; 07/10/2023= 41/56 Goal status: INITIAL     PLAN:  PT FREQUENCY: 1-2x/week  PT DURATION: 8 weeks  PLANNED INTERVENTIONS: Therapeutic exercises, Therapeutic activity, Neuromuscular re-education, Balance training, Gait training, Patient/Family education, Self Care, Joint mobilization, Stair training, Vestibular training, Canalith repositioning, DME instructions, Dry Needling, Electrical stimulation, Cryotherapy, Moist heat,  scar mobilization, Taping, Manual therapy, and Re-evaluation.  PLAN FOR NEXT SESSION:   Coordination, balance and BLE strengthening.  Functional gait training.   Maylon Peppers, PT, DPT  Physical Therapist- [place]  [facility]   9:25 AM 07/23/23

## 2023-07-24 ENCOUNTER — Ambulatory Visit: Payer: 59 | Admitting: Physician Assistant

## 2023-07-24 ENCOUNTER — Encounter: Payer: Self-pay | Admitting: Physician Assistant

## 2023-07-24 VITALS — BP 124/76 | HR 84 | Resp 16 | Ht 72.0 in | Wt 205.0 lb

## 2023-07-24 DIAGNOSIS — L84 Corns and callosities: Secondary | ICD-10-CM

## 2023-07-24 DIAGNOSIS — I25118 Atherosclerotic heart disease of native coronary artery with other forms of angina pectoris: Secondary | ICD-10-CM

## 2023-07-24 DIAGNOSIS — I509 Heart failure, unspecified: Secondary | ICD-10-CM | POA: Diagnosis not present

## 2023-07-24 DIAGNOSIS — E782 Mixed hyperlipidemia: Secondary | ICD-10-CM

## 2023-07-24 DIAGNOSIS — I10 Essential (primary) hypertension: Secondary | ICD-10-CM

## 2023-07-24 DIAGNOSIS — E1169 Type 2 diabetes mellitus with other specified complication: Secondary | ICD-10-CM

## 2023-07-24 DIAGNOSIS — E119 Type 2 diabetes mellitus without complications: Secondary | ICD-10-CM

## 2023-07-24 NOTE — Progress Notes (Unsigned)
Established Patient Office Visit  Name: Lawrence Holmes   MRN: [id number]    DOB: [birth year]   Date:07/26/2023  Today's Provider: Jacquelin Hawking, MHS, PA-C Introduced myself to the patient as a PA-C and provided education on APPs in clinical practice.         Subjective  Chief Complaint  Chief Complaint  Patient presents with   Follow-up    2 month   Diabetes   Hypertension   Hyperlipidemia    HPI   HYPERTENSION / HYPERLIPIDEMIA/ CHF  Satisfied with current treatment? yes Duration of hypertension: years BP monitoring frequency: rarely BP range:  BP medication side effects: no Past BP meds: Carvedilol 6.25 mg PO BID, Entresto 24-26 mg PO BID, Spironolactone 25 mg PO every day  Duration of hyperlipidemia: years Cholesterol medication side effects: no Cholesterol supplements: none Past cholesterol medications: Atorvastatin 80 mg PO every day, Entresto 24-26 mg PO BID Medication compliance: good compliance Aspirin: yes Recent stressors: no Recurrent headaches: no Visual changes: no Palpitations: no Dyspnea: no Chest pain: no Lower extremity edema: no Dizzy/lightheaded: no   Diabetes, Type 2 - Last A1c 6.7 % - Medications: Metformin 500 mg PO BID, Jardiance 10 mg PO every day,  - Compliance: good  - Checking BG at home: he is checking infrequently at home - usually 120 after eating, fasting is in high 70s  - Eye exam: Has not been to ophthalmology yet- reports he will try to make apt today  - Foot exam: completed today  - Microalbumin: ordered today  - Statin: on statin therapy  - PNA vaccine: completed  - Denies symptoms of hypoglycemia, polyuria, polydipsia, numbness extremities, foot ulcers/trauma      Patient Active Problem List   Diagnosis Date Noted   S/P lumbar microdiscectomy 05/16/2023   Hematuria 05/12/2023   Acute urinary retention 05/12/2023   Bilateral hydronephrosis 05/12/2023   HTN (hypertension) 05/12/2023   HLD (hyperlipidemia)  05/12/2023   Type II diabetes mellitus with renal manifestations (HCC) 05/12/2023   Chronic kidney disease, stage 3a (HCC) 05/12/2023   Chronic systolic CHF (congestive heart failure) (HCC) 05/12/2023   BPH (benign prostatic hyperplasia) 05/12/2023   UTI (urinary tract infection) 05/12/2023   CAD (coronary artery disease) 05/12/2023   Intervertebral lumbar disc disorder with myelopathy, lumbar region 04/02/2023   Impaired functional mobility, balance, gait, and endurance 03/26/2023   Bilateral leg weakness 03/26/2023   Abnormal prostate exam 05/09/2022   Aortic atherosclerosis (HCC) 02/24/2022   Smokes with greater than 30 pack year history 11/18/2021   Coronary artery disease of native artery of native heart with stable angina pectoris (HCC) 05/19/2021   Dyslipidemia 07/30/2020   Polyp of colon    Type 2 diabetes mellitus, controlled (HCC) 02/08/2018   History of myocardial infarction 11/08/2017   Tobacco use 11/08/2017   Ischemic cardiomyopathy 11/08/2017   CHF (congestive heart failure) (HCC) 11/08/2017   Foot callus 11/08/2017   Onychomycosis of multiple toenails with type 2 diabetes mellitus (HCC) 11/08/2017   Cataract, nuclear sclerotic, left eye 04/04/2016    Past Surgical History:  Procedure Laterality Date   CATARACT EXTRACTION Bilateral    COLONOSCOPY WITH PROPOFOL N/A 04/04/2019   Procedure: COLONOSCOPY WITH PROPOFOL;  Surgeon: Pasty Spillers, MD;  Location: [facility];  Service: Endoscopy;  Laterality: N/A;   CYSTOSCOPY WITH FULGERATION N/A 05/14/2023   Procedure: CYSTOSCOPY WITH CLOT EVACUATION AND FULGERATION;  Surgeon: Vanna Scotland, MD;  Location: [facility];  Service: Urology;  Laterality: N/A;   HOLEP-LASER ENUCLEATION OF THE PROSTATE WITH MORCELLATION N/A 06/08/2023   Procedure: HOLEP-LASER ENUCLEATION OF THE PROSTATE WITH MORCELLATION;  Surgeon: Sondra Come, MD;  Location: [facility];  Service: Urology;  Laterality: N/A;   LEFT HEART CATH AND  CORONARY ANGIOGRAPHY N/A 07/21/2017   Procedure: LEFT HEART CATH AND CORONARY ANGIOGRAPHY;  Surgeon: Iran Ouch, MD;  Location: [facility];  Service: Cardiovascular;  Laterality: N/A;   LUMBAR LAMINECTOMY/DECOMPRESSION MICRODISCECTOMY Bilateral 04/05/2023   Procedure: MINIMALLY INVASIVE (MIS) L2-3 LAMINECTOMY AND DISCECTOMY;  Surgeon: Loreen Freud, MD;  Location: [facility];  Service: Neurosurgery;  Laterality: Bilateral;   XI ROBOTIC ASSISTED INGUINAL HERNIA REPAIR WITH MESH Left 05/19/2022   Procedure: XI ROBOTIC ASSISTED INGUINAL HERNIA REPAIR WITH MESH;  Surgeon: Campbell Lerner, MD;  Location: [facility];  Service: General;  Laterality: Left;    Family History  Problem Relation Age of Onset   Heart disease Mother    Hyperlipidemia Mother    Hypertension Mother    Heart attack Sister     Social History   Tobacco Use   Smoking status: Every Day    Current packs/day: 1.00    Average packs/day: 1 pack/day for 48.9 years (48.9 ttl pk-yrs)    Types: Cigarettes    Start date: 08/21/1974    Passive exposure: Current   Smokeless tobacco: Never   Tobacco comments:    Started smoking around 67 y/o has stopped several times  Substance Use Topics   Alcohol use: Yes    Comment: rare     Current Outpatient Medications:    ACCU-CHEK GUIDE test strip, SMARTSIG:Via Meter 1-4 Times Daily, Disp: , Rfl:    Accu-Chek Softclix Lancets lancets, SMARTSIG:Via Meter 1-4 Times Daily, Disp: , Rfl:    aspirin 81 MG chewable tablet, Chew 1 tablet (81 mg total) by mouth daily., Disp: 30 tablet, Rfl: 2   atorvastatin (LIPITOR) 80 MG tablet, Take 1 tablet (80 mg total) by mouth at bedtime., Disp: 90 tablet, Rfl: 3   Blood Gluc Meter Disp-Strips (BLOOD GLUCOSE METER DISPOSABLE) DEVI, Check fingerstick blood sugars once a day; E11.69, LON 99 months, Disp: 100 each, Rfl: 1   blood glucose meter kit and supplies KIT, Dispense based on patient and insurance preference. Use up to four times  daily as directed. (FOR ICD-9 250.00, 250.01)., Disp: 1 each, Rfl: 0   carvedilol (COREG) 6.25 MG tablet, Take 1 tablet (6.25 mg total) by mouth 2 (two) times daily with a meal., Disp: 180 tablet, Rfl: 3   empagliflozin (JARDIANCE) 10 MG TABS tablet, Take 1 tablet (10 mg total) by mouth daily before breakfast., Disp: 90 tablet, Rfl: 1   metFORMIN (GLUCOPHAGE) 500 MG tablet, Take 1 tablet (500 mg total) by mouth 2 (two) times daily with a meal., Disp: 180 tablet, Rfl: 3   sacubitril-valsartan (ENTRESTO) 24-26 MG, TAKE 1 TABLET BY MOUTH TWICE DAILY, Disp: 60 tablet, Rfl: 6   spironolactone (ALDACTONE) 25 MG tablet, TAKE 1 TABLET(25 MG) BY MOUTH DAILY, Disp: 90 tablet, Rfl: 2  No Known Allergies  I personally reviewed active problem list, medication list, allergies, health maintenance, notes from last encounter, lab results with the patient/caregiver today.   ROS  See HPI for relevant ROS   Objective  Vitals:   07/24/23 1354  BP: 124/76  Pulse: 84  Resp: 16  SpO2: 98%  Weight: 205 lb (93 kg)  Height: 6' (1.829 m)    Body mass index  is 27.8 kg/m.  Physical Exam Vitals reviewed.  Constitutional:      General: He is awake.     Appearance: Normal appearance. He is well-developed and well-groomed.  HENT:     Head: Normocephalic and atraumatic.  Cardiovascular:     Rate and Rhythm: Normal rate and regular rhythm.     Pulses: Normal pulses.          Radial pulses are 2+ on the right side and 2+ on the left side.     Heart sounds: Normal heart sounds. No murmur heard.    No friction rub. No gallop.  Pulmonary:     Effort: Pulmonary effort is normal.     Breath sounds: Normal breath sounds. No decreased air movement. No decreased breath sounds, wheezing, rhonchi or rales.  Musculoskeletal:     Cervical back: Normal range of motion.     Right lower leg: No edema.     Left lower leg: No edema.  Neurological:     General: No focal deficit present.     Mental Status: He is alert  and oriented to person, place, and time. Mental status is at baseline.     GCS: GCS eye subscore is 4. GCS verbal subscore is 5. GCS motor subscore is 6.  Psychiatric:        Attention and Perception: Attention and perception normal.        Mood and Affect: Mood and affect normal.        Speech: Speech normal.        Behavior: Behavior normal. Behavior is cooperative.        Thought Content: Thought content normal.        Cognition and Memory: Cognition normal.         PHQ2/9:    07/24/2023    1:53 PM 05/22/2023    3:11 PM 05/10/2023    8:06 AM 04/03/2023    1:25 PM 03/26/2023    1:21 PM  Depression screen PHQ 2/9  Decreased Interest 0 0 0 0 0  Down, Depressed, Hopeless 0 0 0 0 0  PHQ - 2 Score 0 0 0 0 0  Altered sleeping 0 0 0 0 0  Tired, decreased energy 0 0 0 0 0  Change in appetite 0 0 0 0 0  Feeling bad or failure about yourself  0 0 0 0 0  Trouble concentrating 0 0 0 0 0  Moving slowly or fidgety/restless 0 0 0 0 0  Suicidal thoughts 0 0 0 0 0  PHQ-9 Score 0 0 0 0 0  Difficult doing work/chores  Not difficult at all Not difficult at all Not difficult at all Not difficult at all      Fall Risk:    07/24/2023    1:52 PM 05/22/2023    3:11 PM 05/10/2023    8:06 AM 04/03/2023    1:24 PM 03/26/2023    1:21 PM  Fall Risk   Falls in the past year? 0 0 0 0 0  Number falls in past yr: 0 0 0 0 0  Injury with Fall? 0 0 0 0 0  Risk for fall due to : No Fall Risks No Fall Risks No Fall Risks No Fall Risks No Fall Risks  Follow up Falls prevention discussed Falls prevention discussed;Education provided;Falls evaluation completed Falls prevention discussed;Education provided;Falls evaluation completed Falls prevention discussed;Education provided;Falls evaluation completed Falls prevention discussed;Education provided;Falls evaluation completed      Functional Status Survey:  Is the patient deaf or have difficulty hearing?: No Does the patient have difficulty seeing, even when  wearing glasses/contacts?: No Does the patient have difficulty concentrating, remembering, or making decisions?: No Does the patient have difficulty walking or climbing stairs?: No Does the patient have difficulty dressing or bathing?: No Does the patient have difficulty doing errands alone such as visiting a doctor's office or shopping?: No    Assessment & Plan  Problem List Items Addressed This Visit       Cardiovascular and Mediastinum   CHF (congestive heart failure) (HCC) (Chronic)    Chronic, ongoing He is managed by Cardiology for this  Most recent Echo reviewed which demonstrates estimated EF to be 45-50% and mild mitral regurg Currently taking Carvedilol 6.25 mg PO BID, Entresto, jardiance 10 mg PO every day, Spironolactone 25 mg PO every day and appears to be tolerating well Continue current regimen and Cardiology follow up  Follow up in 6 months or sooner if concerns arise        Coronary artery disease of native artery of native heart with stable angina pectoris (HCC)    Chronic, historic condition He is currently taking Atorvastatin 80 mg PO every day, Entresto 24-26 mg PO BID and appears to be tolerating well He is followed by cardiology regularly Continue regular follow-up with specialty.  Continue current regimen Follow-up in 6 months or sooner if concerns arise       Relevant Orders   Lipid Profile (Completed)   HTN (hypertension)    Chronic, historic condition Appears well-managed as BP is currently in goal today.  He is not checking blood pressures at home regularly Current regimen is comprised of Carvedilol 6.25 mg PO BID, Entresto 24-26 mg PO BID, Spironolactone 25 mg PO every day Continue current regimen Follow-up in 6 months or sooner if concerns arise      Relevant Orders   COMPLETE METABOLIC PANEL WITH GFR (Completed)   CBC w/Diff/Platelet (Completed)     Endocrine   Type 2 diabetes mellitus, controlled (HCC) - Primary    Chronic, historic  condition Most recent A1c was 6.7%-recheck A1c today.  Results to dictate further management Is currently managed on metformin 500 mg p.o. twice daily, Jardiance 10 mg p.o. daily He reports that he is infrequently checking blood sugars at home.  He states that levels are usually around 120 after eating and fasting can sometimes be in the high 70s.  He denies episodes of hypoglycemia, lightheadedness, LOC Reviewed importance of ophthalmology appointment for retinopathy monitoring Continue current management but may need to pull back a little bit if fasting glucoses remain in 70s to avoid hypoglycemia Follow-up in 3 months or sooner if concerns arise      Relevant Orders   HgB A1c (Completed)   Urine Microalbumin w/creat. ratio (Completed)   HM Diabetes Foot Exam (Completed)   Ambulatory referral to Podiatry     Musculoskeletal and Integument   Foot callus    Likely chronic, ongoing Diabetic foot exam completed today demonstrates significant callus formation along the lateral aspects of metatarsals and heels which are likely obstructing appropriate sensation Will place referral to podiatry for medical pedicure and callus removal      Relevant Orders   Ambulatory referral to Podiatry     Other   HLD (hyperlipidemia)    Chronic, historic condition Currently taking atorvastatin 80 mg p.o. daily, Entresto 24-26 mg p.o. twice daily and appears to be tolerating well Continue current regimen Recheck lipid panel  today Results to dictate further management Follow-up in 6 months or sooner if concerns arise      Relevant Orders   Lipid Profile (Completed)     Return in about 3 months (around 10/22/2023) for DM, HLD, HTN.   I, Nyaja Dubu[place] E Keiasia Christianson, PA-C, have reviewed all documentation for this visit. The documentation on 07/26/23 for the exam, diagnosis, procedures, and orders are all accurate and complete.   Jacquelin Hawking, MHS, PA-C [facility]

## 2023-07-25 ENCOUNTER — Ambulatory Visit: Payer: 59

## 2023-07-25 ENCOUNTER — Ambulatory Visit
Admission: RE | Admit: 2023-07-25 | Discharge: 2023-07-25 | Disposition: A | Discharge status: Home / Self Care | Payer: 59 | Source: Ambulatory Visit | Attending: Acute Care | Admitting: Acute Care

## 2023-07-25 DIAGNOSIS — Z87891 Personal history of nicotine dependence: Secondary | ICD-10-CM | POA: Diagnosis present

## 2023-07-25 DIAGNOSIS — R911 Solitary pulmonary nodule: Secondary | ICD-10-CM | POA: Diagnosis present

## 2023-07-25 LAB — COMPLETE METABOLIC PANEL WITH GFR
AG Ratio: 1.5 (calc) (ref 1.0–2.5)
ALT: 25 U/L (ref 9–46)
AST: 18 U/L (ref 10–35)
Albumin: 4.1 g/dL (ref 3.6–5.1)
Alkaline phosphatase (APISO): 76 U/L (ref 35–144)
BUN: 13 mg/dL (ref 7–25)
CO2: 30 mmol/L (ref 20–32)
Calcium: 9.3 mg/dL (ref 8.6–10.3)
Chloride: 104 mmol/L (ref 98–110)
Creat: 1 mg/dL (ref 0.70–1.35)
Globulin: 2.8 g/dL (ref 1.9–3.7)
Glucose, Bld: 72 mg/dL (ref 65–99)
Potassium: 4.4 mmol/L (ref 3.5–5.3)
Sodium: 140 mmol/L (ref 135–146)
Total Bilirubin: 0.3 mg/dL (ref 0.2–1.2)
Total Protein: 6.9 g/dL (ref 6.1–8.1)
eGFR: 82 mL/min/{1.73_m2} (ref >=60)

## 2023-07-25 LAB — CBC WITH DIFFERENTIAL/PLATELET
Absolute Lymphocytes: 3836 {cells}/uL (ref 850–3900)
Absolute Monocytes: 632 {cells}/uL (ref 200–950)
Basophils Absolute: 53 {cells}/uL (ref 0–200)
Basophils Relative: 0.6 %
Eosinophils Absolute: 650 {cells}/uL — ABNORMAL HIGH (ref 15–500)
Eosinophils Relative: 7.3 %
HCT: 45 % (ref 38.5–50.0)
Hemoglobin: 14.8 g/dL (ref 13.2–17.1)
MCH: 29.6 pg (ref 27.0–33.0)
MCHC: 32.9 g/dL (ref 32.0–36.0)
MCV: 90 fL (ref 80.0–100.0)
MPV: 12.9 fL — ABNORMAL HIGH (ref 7.5–12.5)
Monocytes Relative: 7.1 %
Neutro Abs: 3729 {cells}/uL (ref 1500–7800)
Neutrophils Relative %: 41.9 %
Platelets: 284 10*3/uL (ref 140–400)
RBC: 5 10*6/uL (ref 4.20–5.80)
RDW: 13.4 % (ref 11.0–15.0)
Total Lymphocyte: 43.1 %
WBC: 8.9 10*3/uL (ref 3.8–10.8)

## 2023-07-25 LAB — LIPID PANEL
Cholesterol: 113 mg/dL (ref <200)
HDL: 25 mg/dL — ABNORMAL LOW (ref >=40)
LDL Cholesterol (Calc): 68 mg/dL
Non-HDL Cholesterol (Calc): 88 mg/dL (ref <130)
Total CHOL/HDL Ratio: 4.5 (calc) (ref <5.0)
Triglycerides: 122 mg/dL (ref <150)

## 2023-07-25 LAB — MICROALBUMIN / CREATININE URINE RATIO
Creatinine, Urine: 117 mg/dL (ref 20–320)
Microalb Creat Ratio: 52 mg/g{creat} — ABNORMAL HIGH (ref <30)
Microalb, Ur: 6.1 mg/dL

## 2023-07-25 LAB — HEMOGLOBIN A1C
Hgb A1c MFr Bld: 6.2 %{Hb} — ABNORMAL HIGH (ref <5.7)
Mean Plasma Glucose: 131 mg/dL
eAG (mmol/L): 7.3 mmol/L

## 2023-07-26 ENCOUNTER — Ambulatory Visit: Payer: 59 | Admitting: Physical Therapy

## 2023-07-26 DIAGNOSIS — R278 Other lack of coordination: Secondary | ICD-10-CM | POA: Diagnosis not present

## 2023-07-26 DIAGNOSIS — M6281 Muscle weakness (generalized): Secondary | ICD-10-CM

## 2023-07-26 DIAGNOSIS — R2681 Unsteadiness on feet: Secondary | ICD-10-CM

## 2023-07-26 DIAGNOSIS — R262 Difficulty in walking, not elsewhere classified: Secondary | ICD-10-CM

## 2023-07-26 NOTE — Assessment & Plan Note (Signed)
Chronic, historic condition Appears well-managed as BP is currently in goal today.  He is not checking blood pressures at home regularly Current regimen is comprised of Carvedilol 6.25 mg PO BID, Entresto 24-26 mg PO BID, Spironolactone 25 mg PO every day Continue current regimen Follow-up in 6 months or sooner if concerns arise

## 2023-07-26 NOTE — Therapy (Signed)
OUTPATIENT PHYSICAL THERAPY THORACOLUMBAR and NEURO TREATMENT/  Re-certificaiton    Patient Name: Lawrence Holmes [place] MRN: [id number] DOB:05-03-1956, 67 y.o., male Today's Date: 07/26/2023  END OF SESSION:  PT End of Session - 07/26/23 1412     Visit Number 15    Number of Visits 16    Date for PT Re-Evaluation 07/24/23    Progress Note Due on Visit 20    PT Start Time 1401    PT Stop Time 1441    PT Time Calculation (min) 40 min    Equipment Utilized During Treatment Gait belt    Activity Tolerance Patient tolerated treatment well;No increased pain    Behavior During Therapy [facility] for tasks assessed/performed                 Past Medical History:  Diagnosis Date   Aortic atherosclerosis (HCC)    Arthritis    BPH with urinary obstruction    Chronic systolic heart failure (HCC) 07/23/2017   a.) TTE 07/23/2017: EF 30-35%, mild LVH, inferolateral and inferior AK, mild BAE, G1DD; b.) TTE 10/23/2017: EF 40-45%, mild LVH, basal-mid inferior and inferoseptal AK, mild MR; c.) TTE 11/19/2021: EF 45-50%, glob HK with mod to severe inferior and basal to mid septal HK, GLS -15.3%, mild MR, G1DD.   Coronary artery disease 07/21/2017   a. 07/2017 Late presenting inferior STEMI/Cath: LM nl, LAD min irregs, LCX 100d (L->L collats), RCA 100p (L->R collats), EF 25-35%-->Med Rx; b. 10/2017 ETT (DOT): Ex time 7:38, baseline antlat TWI, no acute changes; c. 09/2019 ETT: Ex time 6:49. No ECG changes.   DDD (degenerative disc disease), lumbar    a.) s/p MIS L2-3 laminectomy and disectomy   Diverticulosis of colon    Erectile dysfunction    HTN (hypertension)    Hx of bilateral cataract extraction    Hyperlipidemia    Ischemic cardiomyopathy    a.) LHC 07/21/2017: EF 25-35%; b.) TTE 07/23/2017: 30-35%; c.) TTE 10/23/2017: EF 40-45%; Holmes.) TTE 11/19/2021: EF 45-50%   Left inguinal hernia    a.) s/p repair 05/19/2022   ST elevation myocardial infarction (STEMI) of inferior wall (HCC) 07/21/2017   a.)  LHC 07/21/2017: EF 25-35%, LVEDP 14 mmHg. 40% pLCx, 40% mLCx, 100% dLCx, 100% pRCA --> attempted to cross wire at RCA lesion, however unable. Reasonable RCA collaterals already formed --> med mgmt.   T2DM (type 2 diabetes mellitus) (HCC)    Tobacco use    Past Surgical History:  Procedure Laterality Date   CATARACT EXTRACTION Bilateral    COLONOSCOPY WITH PROPOFOL N/A 04/04/2019   Procedure: COLONOSCOPY WITH PROPOFOL;  Surgeon: Pasty Spillers, MD;  Location: [facility] ENDOSCOPY;  Service: Endoscopy;  Laterality: N/A;   CYSTOSCOPY WITH FULGERATION N/A 05/14/2023   Procedure: CYSTOSCOPY WITH CLOT EVACUATION AND FULGERATION;  Surgeon: Vanna Scotland, MD;  Location: [facility] ORS;  Service: Urology;  Laterality: N/A;   HOLEP-LASER ENUCLEATION OF THE PROSTATE WITH MORCELLATION N/A 06/08/2023   Procedure: HOLEP-LASER ENUCLEATION OF THE PROSTATE WITH MORCELLATION;  Surgeon: Sondra Come, MD;  Location: [facility] ORS;  Service: Urology;  Laterality: N/A;   LEFT HEART CATH AND CORONARY ANGIOGRAPHY N/A 07/21/2017   Procedure: LEFT HEART CATH AND CORONARY ANGIOGRAPHY;  Surgeon: Iran Ouch, MD;  Location: [facility] INVASIVE CV LAB;  Service: Cardiovascular;  Laterality: N/A;   LUMBAR LAMINECTOMY/DECOMPRESSION MICRODISCECTOMY Bilateral 04/05/2023   Procedure: MINIMALLY INVASIVE (MIS) L2-3 LAMINECTOMY AND DISCECTOMY;  Surgeon: Loreen Freud, MD;  Location: [facility] ORS;  Service: Neurosurgery;  Laterality: Bilateral;   XI ROBOTIC ASSISTED INGUINAL HERNIA REPAIR WITH MESH Left 05/19/2022   Procedure: XI ROBOTIC ASSISTED INGUINAL HERNIA REPAIR WITH MESH;  Surgeon: Campbell Lerner, MD;  Location: [facility] ORS;  Service: General;  Laterality: Left;   Patient Active Problem List   Diagnosis Date Noted   S/P lumbar microdiscectomy 05/16/2023   Hematuria 05/12/2023   Acute urinary retention 05/12/2023   Bilateral hydronephrosis 05/12/2023   HTN (hypertension) 05/12/2023   HLD (hyperlipidemia) 05/12/2023   Type II  diabetes mellitus with renal manifestations (HCC) 05/12/2023   Chronic kidney disease, stage 3a (HCC) 05/12/2023   Chronic systolic CHF (congestive heart failure) (HCC) 05/12/2023   BPH (benign prostatic hyperplasia) 05/12/2023   UTI (urinary tract infection) 05/12/2023   CAD (coronary artery disease) 05/12/2023   Intervertebral lumbar disc disorder with myelopathy, lumbar region 04/02/2023   Impaired functional mobility, balance, gait, and endurance 03/26/2023   Bilateral leg weakness 03/26/2023   Abnormal prostate exam 05/09/2022   Aortic atherosclerosis (HCC) 02/24/2022   Smokes with greater than 30 pack year history 11/18/2021   Coronary artery disease of native artery of native heart with stable angina pectoris (HCC) 05/19/2021   Dyslipidemia 07/30/2020   Polyp of colon    Type 2 diabetes mellitus, controlled (HCC) 02/08/2018   History of myocardial infarction 11/08/2017   Tobacco use 11/08/2017   Ischemic cardiomyopathy 11/08/2017   CHF (congestive heart failure) (HCC) 11/08/2017   Foot callus 11/08/2017   Onychomycosis of multiple toenails with type 2 diabetes mellitus (HCC) 11/08/2017   Cataract, nuclear sclerotic, left eye 04/04/2016    PCP: Danelle Berry, PA-C  REFERRING PROVIDER: Oswaldo Conroy Mecum, PA-C  REFERRING DIAG:  M51.06 (ICD-10-CM) - Intervertebral lumbar disc disorder with myelopathy, lumbar region  Z74.09 (ICD-10-CM) - Impaired functional mobility, balance, gait, and endurance  R29.898 (ICD-10-CM) - Bilateral leg weakness  Z98.890 (ICD-10-CM) - S/P lumbar microdiscectomy    Rationale for Evaluation and Treatment: Rehabilitation  THERAPY DIAG:  Other lack of coordination  Unsteadiness on feet  Difficulty in walking, not elsewhere classified  Muscle weakness (generalized)  ONSET DATE: 03/2023  SUBJECTIVE:                                                                                                                                                                                            SUBJECTIVE STATEMENT:   Patient reports feeling well this AM. Denies falls since last session. No pain on arrival. Continues to experience N/T in Bil feet, but states that it is improving.   PERTINENT HISTORY:  Approximately 6 weeks status post Right L2-L3 Laminectomy and microdiscectomy (04/05/23).  He  states that he has graduated from his wheelchair and is almost only using his walker, trying to graduate to his cane.  Patient has a past medical history of arthritis, congestive heart failure, CID, CDD, enlarged prostate, HTN, HLD, left inguinal hernia, type 2 diabetes. 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    PAIN:  Are you having pain? No  Current: 0/10, At worst: 3/10  Pt denies reports of radiating pain, localizes to one area    PRECAUTIONS: Back   He can increase his weight to 25 pounds  RED FLAGS: None   WEIGHT BEARING RESTRICTIONS:  No lifting more than 25 pounds   FALLS:  Has patient fallen in last 6 months? No  LIVING ENVIRONMENT: Lives with: lives alone Lives in: House/apartment Stairs: Yes: External: 3 steps; on left going up Has following equipment at home: Single point cane and Walker - 2 wheeled, grab bars in shower   OCCUPATION: Engineer, drilling   PLOF: Independent  PATIENT GOALS: "Get back to walking without a walker or cane"   NEXT MD VISIT: 06/08/23 : prostate procedure   OBJECTIVE:  Note: Objective measures were completed at Evaluation unless otherwise noted.  DIAGNOSTIC FINDINGS:  IMPRESSION from MRI 03/26/23 1. Severe spinal canal narrowing at L2-L3 secondary to a combination of ligamentum flavum hypertrophy, short pedicles, and a disc bulge. 2. Severe left and moderate right neural foraminal narrowing at L5-S1.   PATIENT SURVEYS:  FOTO 05/29/23: 38  Typical result : 51  SCREENING FOR RED FLAGS: Bowel or bladder  incontinence: No, Yes for bladder due to prostate Spinal tumors: No Cauda equina syndrome: No Compression fracture: No Abdominal aneurysm: No  COGNITION: Overall cognitive status: Within functional limits for tasks assessed     SENSATION: Light touch: WFL Coordination intact   MUSCLE LENGTH: Hamstrings: Right WNL deg; Left 56 deg   POSTURE: rounded shoulders and increased thoracic kyphosis  PALPATION: Tightness of bilateral lumbar paraspinals and quadratus lumborum  Incision neat, healing well.   LUMBAR ROM:   Not tested due to precautions  AROM eval  Flexion   Extension   Right lateral flexion   Left lateral flexion   Right rotation   Left rotation    (Blank rows = not tested)   LOWER EXTREMITY MMT:    MMT Right eval Left eval  Hip flexion 4-/5 4-/5  Hip extension    Hip abduction 4 4  Hip adduction 4- 4  Hip internal rotation    Hip external rotation    Knee flexion 4-/5 4-/5  Knee extension 4+ 4+  Ankle dorsiflexion 4/5 4/5  Ankle plantarflexion 4-/5 4-/5  Ankle inversion    Ankle eversion     (Blank rows = not tested)  LUMBAR SPECIAL TESTS:  Deferred due to precautions Hip special tests: FABER and Scour negative SLR : negative   FUNCTIONAL TESTS:  5 times sit to stand: 16.59s intermittent hand use 10 meter walk test: 20.97  .65m/s with walker   GAIT: Distance walked: 76ft  Assistive device utilized: Environmental consultant - 2 wheeled Level of assistance: CGA Comments: External rotation at the hips, kyphotic posture, poor foot clearance bilateral with L> R. Decreased heel strike with foot flat noted bilaterally  TODAY'S TREATMENT: DATE: 07/26/23   PT instructed pt in goal assessment for re-certificiation. See below for details.    PATIENT EDUCATION:  Education details: HEP, POC, goals  Person educated: Patient Education method: Explanation, Demonstration, and  Handouts Education comprehension: verbalized understanding and returned  demonstration  HOME EXERCISE PROGRAM: Access Code: [email or web address] URL: [email or web address] Date: 05/29/2023 Prepared by: Precious Bard  Exercises - Standing March with Counter Support  - 1 x daily - 7 x weekly - 2 sets - 10 reps - 5 hold - Heel Raises with Counter Support  - 1 x daily - 7 x weekly - 2 sets - 10 reps - 5 hold - Sit to Stand Without Arm Support  - 1 x daily - 7 x weekly - 2 sets - 10 reps - 5 hold  ASSESSMENT:  CLINICAL IMPRESSION:   Patient arrives to treatment session motivated to participate with no new complaints. Goals assessment/adjustment performed for re-certification. Pt noted to have improved balance, coordination, endurance and strength compared to eval, but continues to demonstrate elevated fall risk evidenced by decreased scores on , TUG, 5xSTS and Berg balance scale. Will benefit from DGI assessment given improved functional mobility in next session. Pt will continue to benefit from skilled physical therapy intervention to address impairments, improve QOL, and attain therapy goals.    OBJECTIVE IMPAIRMENTS: Abnormal gait, decreased activity tolerance, decreased balance, decreased endurance, decreased mobility, difficulty walking, decreased strength, impaired flexibility, and improper body mechanics.   ACTIVITY LIMITATIONS: carrying, lifting, bending, standing, squatting, stairs, transfers, bed mobility, dressing, and locomotion level  PARTICIPATION LIMITATIONS: meal prep, cleaning, laundry, driving, shopping, occupation, and yard work  PERSONAL FACTORS: arthritis, congestive heart failure, CID, CDD, enlarged prostate, HTN, HLD, left inguinal hernia, type 2 diabetes are also affecting patient's functional outcome.   REHAB POTENTIAL: Good  CLINICAL DECISION MAKING: Evolving/moderate complexity  EVALUATION COMPLEXITY: Moderate   GOALS: Goals reviewed with patient? Yes  SHORT TERM GOALS: Target date: 08/24/2023    Patient will  demonstrates ability to ambulate for >249ft without AD with supervision assist to allow improved access to community  Goal status: initial    LONG TERM GOALS: Target date: 09/20/2022  Patient will increase 10 meter walk test to >1.48m/s as to improve gait speed for better community ambulation and to reduce fall risk. Baseline: .47 m/s; 07/09/2023= 0.58 m/s with and without cane 12/5: 0.645m/s with SPC  Goal status: PROGRESSING  2.  Patient will increase FOTO score to equal to or greater than 51 to demonstrate statistically significant improvement in mobility and quality of life. Baseline: 05/29/23: 38; 07/09/2023= 49 12/5:47 Goal status: PROGRESSING  3.  Patient (> 63 years old) will complete five times sit to stand test in < 15 seconds indicating an increased LE strength and improved balance. Baseline: 16.59 Intermittent hand use; 07/09/2023= 18.45 sec without UE support  12/5: 16.32 without UE Support  Goal status: PROGRESSING  4. Patient will ambulate 150 ft  without the use of an assistive device in order to increase independence and improve quality of life.  Baseline: Not tested  12/5: able to ambulate >162ft with no AD and CGA only. Mild staggering R but no LOB  goal status: in progress   5.  Patient will increase Berg Balance score by > 6 points to demonstrate decreased fall risk during functional activities. Baseline: awaiting testing until precautions are lifted; 07/10/2023= 41/56 12/5: 43/56 Goal status: in progress.   6. Pt will improved DGI without AD by 4 points to indicate increased safety and reduced fall risk and improved community access.  Baseline to be complete Holmes Status : initial. New.    PLAN:  PT FREQUENCY: 1-2x/week  PT DURATION: 8 weeks  PLANNED INTERVENTIONS: Therapeutic exercises,  Therapeutic activity, Neuromuscular re-education, Balance training, Gait training, Patient/Family education, Self Care, Joint mobilization, Stair training, Vestibular  training, Canalith repositioning, DME instructions, Dry Needling, Electrical stimulation, Cryotherapy, Moist heat, scar mobilization, Taping, Manual therapy, and Re-evaluation.  PLAN FOR NEXT SESSION:   Coordination, balance and BLE strengthening.  Functional gait training.   Grier Rocher PT, DPT  Physical Therapist - [place]  [facility]  11:31 AM 07/27/23

## 2023-07-26 NOTE — Assessment & Plan Note (Signed)
Likely chronic, ongoing Diabetic foot exam completed today demonstrates significant callus formation along the lateral aspects of metatarsals and heels which are likely obstructing appropriate sensation Will place referral to podiatry for medical pedicure and callus removal

## 2023-07-26 NOTE — Progress Notes (Signed)
Your labs are back Your A1c is 6.2%.  This is well within recommended goal for type 2 diabetes.  Please continue with your current medication regimen. Your urine microalbumin showed some moderately increased ratio but this is to be expected with type 2 diabetes Your cholesterol looks great Your CBC is overall normal and stable compared to previous results.  No signs of anemia Your electrolytes, liver and kidney function are overall in normal range Please continue current medication regimen as directed.  Please let us know if you have further questions or concerns

## 2023-07-26 NOTE — Assessment & Plan Note (Signed)
Chronic, ongoing He is managed by Cardiology for this  Most recent Echo reviewed which demonstrates estimated EF to be 45-50% and mild mitral regurg Currently taking Carvedilol 6.25 mg PO BID, Entresto, jardiance 10 mg PO every day, Spironolactone 25 mg PO every day and appears to be tolerating well Continue current regimen and Cardiology follow up  Follow up in 6 months or sooner if concerns arise

## 2023-07-26 NOTE — Assessment & Plan Note (Signed)
Chronic, historic condition Currently taking atorvastatin 80 mg p.o. daily, Entresto 24-26 mg p.o. twice daily and appears to be tolerating well Continue current regimen Recheck lipid panel today Results to dictate further management Follow-up in 6 months or sooner if concerns arise

## 2023-07-26 NOTE — Assessment & Plan Note (Signed)
Chronic, historic condition He is currently taking Atorvastatin 80 mg PO every day, Entresto 24-26 mg PO BID and appears to be tolerating well He is followed by cardiology regularly Continue regular follow-up with specialty.  Continue current regimen Follow-up in 6 months or sooner if concerns arise

## 2023-07-26 NOTE — Assessment & Plan Note (Addendum)
Chronic, historic condition Most recent A1c was 6.7%-recheck A1c today.  Results to dictate further management Is currently managed on metformin 500 mg p.o. twice daily, Jardiance 10 mg p.o. daily He reports that he is infrequently checking blood sugars at home.  He states that levels are usually around 120 after eating and fasting can sometimes be in the high 70s.  He denies episodes of hypoglycemia, lightheadedness, LOC Reviewed importance of ophthalmology appointment for retinopathy monitoring Continue current management but may need to pull back a little bit if fasting glucoses remain in 70s to avoid hypoglycemia Follow-up in 3 months or sooner if concerns arise

## 2023-07-30 ENCOUNTER — Ambulatory Visit: Payer: 59

## 2023-07-30 DIAGNOSIS — R262 Difficulty in walking, not elsewhere classified: Secondary | ICD-10-CM

## 2023-07-30 DIAGNOSIS — R2681 Unsteadiness on feet: Secondary | ICD-10-CM

## 2023-07-30 DIAGNOSIS — R278 Other lack of coordination: Secondary | ICD-10-CM | POA: Diagnosis not present

## 2023-07-30 DIAGNOSIS — M6281 Muscle weakness (generalized): Secondary | ICD-10-CM

## 2023-07-30 NOTE — Therapy (Signed)
OUTPATIENT PHYSICAL THERAPY TREATMENT    Patient Name: Lawrence Holmes MRN: [id number] DOB:Jan 16, 1956, 67 y.o., male Today's Date: 07/30/2023  END OF SESSION:  PT End of Session - 07/30/23 1628     Visit Number 16    Number of Visits 31    Date for PT Re-Evaluation 09/21/23    Authorization Type [employer]    Progress Note Due on Visit 20    PT Start Time 1615    PT Stop Time 1700    PT Time Calculation (min) 45 min    Equipment Utilized During Treatment Gait belt    Activity Tolerance Patient tolerated treatment well;No increased pain    Behavior During Therapy [facility] for tasks assessed/performed                 Past Medical History:  Diagnosis Date   Aortic atherosclerosis (HCC)    Arthritis    BPH with urinary obstruction    Chronic systolic heart failure (HCC) 07/23/2017   a.) TTE 07/23/2017: EF 30-35%, mild LVH, inferolateral and inferior AK, mild BAE, G1DD; b.) TTE 10/23/2017: EF 40-45%, mild LVH, basal-mid inferior and inferoseptal AK, mild MR; c.) TTE 11/19/2021: EF 45-50%, glob HK with mod to severe inferior and basal to mid septal HK, GLS -15.3%, mild MR, G1DD.   Coronary artery disease 07/21/2017   a. 07/2017 Late presenting inferior STEMI/Cath: LM nl, LAD min irregs, LCX 100d (L->L collats), RCA 100p (L->R collats), EF 25-35%-->Med Rx; b. 10/2017 ETT (DOT): Ex time 7:38, baseline antlat TWI, no acute changes; c. 09/2019 ETT: Ex time 6:49. No ECG changes.   DDD (degenerative disc disease), lumbar    a.) s/p MIS L2-3 laminectomy and disectomy   Diverticulosis of colon    Erectile dysfunction    HTN (hypertension)    Hx of bilateral cataract extraction    Hyperlipidemia    Ischemic cardiomyopathy    a.) LHC 07/21/2017: EF 25-35%; b.) TTE 07/23/2017: 30-35%; c.) TTE 10/23/2017: EF 40-45%; d.) TTE 11/19/2021: EF 45-50%   Left inguinal hernia    a.) s/p repair 05/19/2022   ST elevation myocardial infarction (STEMI) of inferior wall (HCC) 07/21/2017   a.) LHC  07/21/2017: EF 25-35%, LVEDP 14 mmHg. 40% pLCx, 40% mLCx, 100% dLCx, 100% pRCA --> attempted to cross wire at RCA lesion, however unable. Reasonable RCA collaterals already formed --> med mgmt.   T2DM (type 2 diabetes mellitus) (HCC)    Tobacco use    Past Surgical History:  Procedure Laterality Date   CATARACT EXTRACTION Bilateral    COLONOSCOPY WITH PROPOFOL N/A 04/04/2019   Procedure: COLONOSCOPY WITH PROPOFOL;  Surgeon: Pasty Spillers, MD;  Location: [facility] ENDOSCOPY;  Service: Endoscopy;  Laterality: N/A;   CYSTOSCOPY WITH FULGERATION N/A 05/14/2023   Procedure: CYSTOSCOPY WITH CLOT EVACUATION AND FULGERATION;  Surgeon: Vanna Scotland, MD;  Location: [facility] ORS;  Service: Urology;  Laterality: N/A;   HOLEP-LASER ENUCLEATION OF THE PROSTATE WITH MORCELLATION N/A 06/08/2023   Procedure: HOLEP-LASER ENUCLEATION OF THE PROSTATE WITH MORCELLATION;  Surgeon: Sondra Come, MD;  Location: [facility] ORS;  Service: Urology;  Laterality: N/A;   LEFT HEART CATH AND CORONARY ANGIOGRAPHY N/A 07/21/2017   Procedure: LEFT HEART CATH AND CORONARY ANGIOGRAPHY;  Surgeon: Iran Ouch, MD;  Location: [facility] INVASIVE CV LAB;  Service: Cardiovascular;  Laterality: N/A;   LUMBAR LAMINECTOMY/DECOMPRESSION MICRODISCECTOMY Bilateral 04/05/2023   Procedure: MINIMALLY INVASIVE (MIS) L2-3 LAMINECTOMY AND DISCECTOMY;  Surgeon: Loreen Freud, MD;  Location: [facility] ORS;  Service:  Neurosurgery;  Laterality: Bilateral;   XI ROBOTIC ASSISTED INGUINAL HERNIA REPAIR WITH MESH Left 05/19/2022   Procedure: XI ROBOTIC ASSISTED INGUINAL HERNIA REPAIR WITH MESH;  Surgeon: Campbell Lerner, MD;  Location: [facility] ORS;  Service: General;  Laterality: Left;   Patient Active Problem List   Diagnosis Date Noted   S/P lumbar microdiscectomy 05/16/2023   Hematuria 05/12/2023   Acute urinary retention 05/12/2023   Bilateral hydronephrosis 05/12/2023   HTN (hypertension) 05/12/2023   HLD (hyperlipidemia) 05/12/2023   Type II  diabetes mellitus with renal manifestations (HCC) 05/12/2023   Chronic kidney disease, stage 3a (HCC) 05/12/2023   Chronic systolic CHF (congestive heart failure) (HCC) 05/12/2023   BPH (benign prostatic hyperplasia) 05/12/2023   UTI (urinary tract infection) 05/12/2023   CAD (coronary artery disease) 05/12/2023   Intervertebral lumbar disc disorder with myelopathy, lumbar region 04/02/2023   Impaired functional mobility, balance, gait, and endurance 03/26/2023   Bilateral leg weakness 03/26/2023   Abnormal prostate exam 05/09/2022   Aortic atherosclerosis (HCC) 02/24/2022   Smokes with greater than 30 pack year history 11/18/2021   Coronary artery disease of native artery of native heart with stable angina pectoris (HCC) 05/19/2021   Dyslipidemia 07/30/2020   Polyp of colon    Type 2 diabetes mellitus, controlled (HCC) 02/08/2018   History of myocardial infarction 11/08/2017   Tobacco use 11/08/2017   Ischemic cardiomyopathy 11/08/2017   CHF (congestive heart failure) (HCC) 11/08/2017   Foot callus 11/08/2017   Onychomycosis of multiple toenails with type 2 diabetes mellitus (HCC) 11/08/2017   Cataract, nuclear sclerotic, left eye 04/04/2016    PCP: Danelle Berry, PA-C  REFERRING PROVIDER: Oswaldo Conroy Mecum, PA-C  REFERRING DIAG:  M51.06 (ICD-10-CM) - Intervertebral lumbar disc disorder with myelopathy, lumbar region  Z74.09 (ICD-10-CM) - Impaired functional mobility, balance, gait, and endurance  R29.898 (ICD-10-CM) - Bilateral leg weakness  Z98.890 (ICD-10-CM) - S/P lumbar microdiscectomy    Rationale for Evaluation and Treatment: Rehabilitation  THERAPY DIAG:  Other lack of coordination  Unsteadiness on feet  Difficulty in walking, not elsewhere classified  Muscle weakness (generalized)  ONSET DATE: 03/2023  SUBJECTIVE:                                                                                                                                                                                            SUBJECTIVE STATEMENT: No updates since last session. Pt reports he doesn't have a formal HEP, just does what he can remember.   PERTINENT HISTORY:  Approximately 6 weeks status post Right L2-L3 Laminectomy and microdiscectomy (04/05/23).  He states that he has graduated from his wheelchair and  is almost only using his walker, trying to graduate to his cane.  Patient has a past medical history of arthritis, congestive heart failure, CID, CDD, enlarged prostate, HTN, HLD, left inguinal hernia, type 2 diabetes. 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    PAIN:  Are you having pain? No     PRECAUTIONS: Back   He can increase his weight to 25 pounds  RED FLAGS: None   WEIGHT BEARING RESTRICTIONS:  No lifting more than 25 pounds   FALLS:  Has patient fallen in last 6 months? No  LIVING ENVIRONMENT: Lives with: lives alone Lives in: House/apartment Stairs: Yes: External: 3 steps; on left going up Has following equipment at home: Single point cane and Walker - 2 wheeled, grab bars in shower   OCCUPATION: Engineer, drilling   PLOF: Independent  PATIENT GOALS: "Get back to walking without a walker or cane"   NEXT MD VISIT: 06/08/23 : prostate procedure   OBJECTIVE:  Note: Objective measures were completed at Evaluation unless otherwise noted.  DIAGNOSTIC FINDINGS:  IMPRESSION from MRI 03/26/23 1. Severe spinal canal narrowing at L2-L3 secondary to a combination of ligamentum flavum hypertrophy, short pedicles, and a disc bulge. 2. Severe left and moderate right neural foraminal narrowing at L5-S1.   PATIENT SURVEYS:  FOTO 05/29/23: 38  Typical result : 51   COGNITION: Overall cognitive status: Within functional limits for tasks assessed     SENSATION: Light touch: WFL   LOWER EXTREMITY MMT:    MMT Right eval Left eval  Hip flexion 4-/5 4-/5  Hip  extension    Hip abduction 4 4  Hip adduction 4- 4  Hip internal rotation    Hip external rotation    Knee flexion 4-/5 4-/5  Knee extension 4+ 4+  Ankle dorsiflexion 4/5 4/5  Ankle plantarflexion 4-/5 4-/5  Ankle inversion    Ankle eversion     (Blank rows = not tested)    FUNCTIONAL TESTS:  5 times sit to stand: 16.59s intermittent hand use 10 meter walk test: 20.97  .109m/s with walker    TODAY'S TREATMENT: DATE: 07/30/23   -AMB overground 324ft, SPC RUE  -STS from 21.5"x8, cues for hands on knees to promote trunk position -seated brace marching 1x10 bilat c 5lb AW  -STS from 21.5"x8, cues for hands on knees to promote trunk position -seated brace marching 1x10 bilat c 5lb AW  -STS from 21.5"x8, cues for hands on knees to promote trunk position  -Seated cable hamstrings curls: 1x12 bilat: 7.5lb (needs to be heavier Left)  -seated LAQ 1x12 @ 5lb each  -seated Green TB clam x15 -Seated cable hamstrings curls: 1x12 bilat: 7.5lb right; 1x12 Left @ 9.5lb -seated LAQ 1x12 @ 6lb each  -seated Blue TB clam x15  -side stepping at support bar c 6lb AW bilat, 2 hand support 5 lengths bilat,  cues for small, light, and fast    PATIENT EDUCATION:  Education details: HEP, POC, goals  Person educated: Patient Education method: Explanation, Demonstration, and Handouts Education comprehension: verbalized understanding and returned demonstration  HOME EXERCISE PROGRAM: Access Code: [email or web address] URL: [email or web address] Date: 05/29/2023 Prepared by: Precious Bard  Exercises - Standing March with Counter Support  - 1 x daily - 7 x weekly - 2 sets - 10 reps - 5 hold - Heel Raises with Counter Support  - 1 x daily - 7 x weekly - 2 sets - 10 reps -  5 hold - Sit to Stand Without Arm Support  - 1 x daily - 7 x weekly - 2 sets - 10 reps - 5 hold  ASSESSMENT:  CLINICAL IMPRESSION:   Pt continued to show signs of improved strength however BLE movents remain ataxic and  concerning for difficult to anticipate LOB. Pt has noted bilat knee snapping with STS transfers, avoids full upright trunk in stance as a compensation secondary to neurologically impaired ankle righting in the sagittal plane. Also of note- Thereasa Parkin sees right ankle myoclonus during seated hip flexion exercises unclear if this is new. Pt will continue to benefit from skilled physical therapy intervention to address impairments, improve QOL, and attain therapy goals.    OBJECTIVE IMPAIRMENTS: Abnormal gait, decreased activity tolerance, decreased balance, decreased endurance, decreased mobility, difficulty walking, decreased strength, impaired flexibility, and improper body mechanics.   ACTIVITY LIMITATIONS: carrying, lifting, bending, standing, squatting, stairs, transfers, bed mobility, dressing, and locomotion level  PARTICIPATION LIMITATIONS: meal prep, cleaning, laundry, driving, shopping, occupation, and yard work  PERSONAL FACTORS: arthritis, congestive heart failure, CID, CDD, enlarged prostate, HTN, HLD, left inguinal hernia, type 2 diabetes are also affecting patient's functional outcome.   REHAB POTENTIAL: Good  CLINICAL DECISION MAKING: Evolving/moderate complexity  EVALUATION COMPLEXITY: Moderate   GOALS: Goals reviewed with patient? Yes  SHORT TERM GOALS: Target date: 08/24/2023    Patient will demonstrates ability to ambulate for >232ft without AD with supervision assist to allow improved access to community  Goal status: initial    LONG TERM GOALS: Target date: 09/20/2022  Patient will increase 10 meter walk test to >1.68m/s as to improve gait speed for better community ambulation and to reduce fall risk. Baseline: .47 m/s; 07/09/2023= 0.58 m/s with and without cane 12/5: 0.618m/s with SPC  Goal status: PROGRESSING  2.  Patient will increase FOTO score to equal to or greater than 51 to demonstrate statistically significant improvement in mobility and quality of  life. Baseline: 05/29/23: 38; 07/09/2023= 49 12/5:47 Goal status: PROGRESSING  3.  Patient (> 39 years old) will complete five times sit to stand test in < 15 seconds indicating an increased LE strength and improved balance. Baseline: 16.59 Intermittent hand use; 07/09/2023= 18.45 sec without UE support  12/5: 16.32 without UE Support  Goal status: PROGRESSING  4. Patient will ambulate 150 ft  without the use of an assistive device in order to increase independence and improve quality of life.  Baseline: Not tested  12/5: able to ambulate >113ft with no AD and CGA only. Mild staggering R but no LOB  goal status: in progress   5.  Patient will increase Berg Balance score by > 6 points to demonstrate decreased fall risk during functional activities. Baseline: awaiting testing until precautions are lifted; 07/10/2023= 41/56 12/5: 43/56 Goal status: in progress.   6. Pt will improved DGI without AD by 4 points to indicate increased safety and reduced fall risk and improved community access.  Baseline to be complete d Status : initial. New.    PLAN:  PT FREQUENCY: 1-2x/week  PT DURATION: 8 weeks  PLANNED INTERVENTIONS: Therapeutic exercises, Therapeutic activity, Neuromuscular re-education, Balance training, Gait training, Patient/Family education, Self Care, Joint mobilization, Stair training, Vestibular training, Canalith repositioning, DME instructions, Dry Needling, Electrical stimulation, Cryotherapy, Moist heat, scar mobilization, Taping, Manual therapy, and Re-evaluation.  PLAN FOR NEXT SESSION:   Coordination, balance and BLE strengthening.  Functional gait training.   5:14 PM, 07/30/23 Rosamaria Lints, PT, DPT Physical Therapist - [facility]  Outpatient Physical Therapy- [facility] [phone]

## 2023-07-31 ENCOUNTER — Ambulatory Visit: Payer: 59 | Attending: Nurse Practitioner

## 2023-07-31 DIAGNOSIS — I5022 Chronic systolic (congestive) heart failure: Secondary | ICD-10-CM

## 2023-07-31 DIAGNOSIS — I493 Ventricular premature depolarization: Secondary | ICD-10-CM | POA: Diagnosis not present

## 2023-07-31 LAB — ECHOCARDIOGRAM COMPLETE
Area-P 1/2: 4.21 cm2
S' Lateral: 4.2 cm

## 2023-08-01 ENCOUNTER — Encounter: Payer: Self-pay | Admitting: Nurse Practitioner

## 2023-08-01 ENCOUNTER — Ambulatory Visit: Payer: 59 | Attending: Nurse Practitioner | Admitting: Nurse Practitioner

## 2023-08-01 VITALS — BP 112/72 | HR 81 | Ht 72.0 in | Wt 206.2 lb

## 2023-08-01 DIAGNOSIS — I255 Ischemic cardiomyopathy: Secondary | ICD-10-CM

## 2023-08-01 DIAGNOSIS — I2510 Atherosclerotic heart disease of native coronary artery without angina pectoris: Secondary | ICD-10-CM

## 2023-08-01 DIAGNOSIS — I493 Ventricular premature depolarization: Secondary | ICD-10-CM | POA: Diagnosis not present

## 2023-08-01 DIAGNOSIS — I10 Essential (primary) hypertension: Secondary | ICD-10-CM

## 2023-08-01 DIAGNOSIS — I5022 Chronic systolic (congestive) heart failure: Secondary | ICD-10-CM | POA: Diagnosis not present

## 2023-08-01 DIAGNOSIS — E119 Type 2 diabetes mellitus without complications: Secondary | ICD-10-CM

## 2023-08-01 DIAGNOSIS — E782 Mixed hyperlipidemia: Secondary | ICD-10-CM

## 2023-08-01 MED ORDER — VARENICLINE TARTRATE 1 MG PO TABS: 1.0000 mg | ORAL_TABLET | Freq: Two times a day (BID) | ORAL | 1 refills | Status: DC

## 2023-08-01 MED ORDER — VARENICLINE TARTRATE (STARTER) 0.5 MG X 11 & 1 MG X 42 PO TBPK: ORAL_TABLET | ORAL | 0 refills | Status: DC

## 2023-08-01 NOTE — Patient Instructions (Signed)
Medication Instructions:  Chantix:   Take one 0.5 mg tablet by mouth once daily for 3 days,  Then increase to one 0.5 mg tablet twice daily for 4 days,  Then increase to one 1 mg tablet twice daily. This is what you will remain on until completed. Two prescriptions have been sent into your pharmacy. One for the starter pack for the first month and then the one that will be started after the first month. You may pick up the Chantix 1 mg twice daily after the starter pack has been completed.   *If you need a refill on your cardiac medications before your next appointment, please call your pharmacy*   Lab Work: None ordered If you have labs (blood work) drawn today and your tests are completely normal, you will receive your results only by: MyChart Message (if you have MyChart) OR A paper copy in the mail If you have any lab test that is abnormal or we need to change your treatment, we will call you to review the results.   Testing/Procedures: None ordered   Follow-Up: At [facility], you and your health needs are our priority.  As part of our continuing mission to provide you with exceptional heart care, we have created designated Provider Care Teams.  These Care Teams include your primary Cardiologist (physician) and Advanced Practice Providers (APPs -  Physician Assistants and Nurse Practitioners) who all work together to provide you with the care you need, when you need it.  We recommend signing up for the patient portal called "MyChart".  Sign up information is provided on this After Visit Summary.  MyChart is used to connect with patients for Virtual Visits (Telemedicine).  Patients are able to view lab/test results, encounter notes, upcoming appointments, etc.  Non-urgent messages can be sent to your provider as well.   To learn more about what you can do with MyChart, go to [email or web address].    Your next appointment:   Follow up in January with Dr. Jimmey Ralph

## 2023-08-01 NOTE — Progress Notes (Signed)
Office Visit    Patient Name: Lawrence Holmes Date of Encounter: 08/01/2023  Primary Care Provider:  Danelle Berry, PA-C Primary Cardiologist:  Lawrence Bears, MD  Chief Complaint    67 y.o. male with a history of late presenting STEMI/CAD in December 2018 (medically managed), ischemic cardiomyopathy/HFmrEF, type 2 diabetes mellitus, hypertension, and hyperlipidemia, who presents for follow-up related to CAD and PVCs.  Past Medical History  Subjective   Past Medical History:  Diagnosis Date   Aortic atherosclerosis (HCC)    Arthritis    BPH with urinary obstruction    Chronic systolic heart failure (HCC) 07/23/2017   a. TTE 07/23/2017: EF 30-35%, mild LVH, inferolateral and inferior AK, mild BAE, G1DD; b. TTE 10/23/2017: EF 40-45%, mild LVH, basal-mid inferior and inferoseptal AK, mild MR; c. TTE 11/19/2021: EF 45-50%, glob HK with mod to severe inferior and basal to mid septal HK, GLS -15.3%, mild MR, G1DD; d. 07/2023 Echo: EF 40-45%, inf/post HK, GrI DD, nl RV fxn.   Coronary artery disease 07/21/2017   a. 07/2017 Late presenting inferior STEMI/Cath: LM nl, LAD min irregs, LCX 100d (L->L collats), RCA 100p (L->R collats), EF 25-35%-->Med Rx; b. 10/2017 ETT (DOT): Ex time 7:38, baseline antlat TWI, no acute changes; c. 09/2019 ETT: Ex time 6:49. No ECG changes.   DDD (degenerative disc disease), lumbar    a.) s/p MIS L2-3 laminectomy and disectomy   Diverticulosis of colon    Erectile dysfunction    HTN (hypertension)    Hx of bilateral cataract extraction    Hyperlipidemia    Ischemic cardiomyopathy    a. LHC 07/21/2017: EF 25-35%; b. TTE 07/23/2017: 30-35%; c. TTE 10/23/2017: EF 40-45%; d. TTE 11/19/2021: EF 45-50%; e. 07/2023 Echo: EF 40-45%.   Left inguinal hernia    a.) s/p repair 05/19/2022   PVC's (premature ventricular contractions)    a. 06/2023 72 hr Zio: Predominantly sinus rhythm at an average rate of 81 (57-1 33).  1 7 beat run of SVT at 133.  1.3% PAC burden.  14%  PVC burden.   ST elevation myocardial infarction (STEMI) of inferior wall (HCC) 07/21/2017   a. LHC 07/21/2017: EF 25-35%, LVEDP 14 mmHg. 40% pLCx, 40% mLCx, 100% dLCx, 100% pRCA --> attempted to cross wire at RCA lesion, however unable. Reasonable RCA collaterals already formed --> med mgmt.   T2DM (type 2 diabetes mellitus) (HCC)    Tobacco use    Past Surgical History:  Procedure Laterality Date   CATARACT EXTRACTION Bilateral    COLONOSCOPY WITH PROPOFOL N/A 04/04/2019   Procedure: COLONOSCOPY WITH PROPOFOL;  Surgeon: Lawrence Spillers, MD;  Location: [facility];  Service: Endoscopy;  Laterality: N/A;   CYSTOSCOPY WITH FULGERATION N/A 05/14/2023   Procedure: CYSTOSCOPY WITH CLOT EVACUATION AND FULGERATION;  Surgeon: Lawrence Scotland, MD;  Location: [facility];  Service: Urology;  Laterality: N/A;   HOLEP-LASER ENUCLEATION OF THE PROSTATE WITH MORCELLATION N/A 06/08/2023   Procedure: HOLEP-LASER ENUCLEATION OF THE PROSTATE WITH MORCELLATION;  Surgeon: Lawrence Come, MD;  Location: [facility];  Service: Urology;  Laterality: N/A;   LEFT HEART CATH AND CORONARY ANGIOGRAPHY N/A 07/21/2017   Procedure: LEFT HEART CATH AND CORONARY ANGIOGRAPHY;  Surgeon: Lawrence Ouch, MD;  Location: [facility];  Service: Cardiovascular;  Laterality: N/A;   LUMBAR LAMINECTOMY/DECOMPRESSION MICRODISCECTOMY Bilateral 04/05/2023   Procedure: MINIMALLY INVASIVE (MIS) L2-3 LAMINECTOMY AND DISCECTOMY;  Surgeon: Lawrence Freud, MD;  Location: [facility];  Service: Neurosurgery;  Laterality: Bilateral;  XI ROBOTIC ASSISTED INGUINAL HERNIA REPAIR WITH MESH Left 05/19/2022   Procedure: XI ROBOTIC ASSISTED INGUINAL HERNIA REPAIR WITH MESH;  Surgeon: Lawrence Lerner, MD;  Location: [facility];  Service: General;  Laterality: Left;    Allergies  No Known Allergies    History of Present Illness      67 y.o. y/o male with a history of late presenting STEMI/CAD in December 2018 (medically managed),  ischemic cardiomyopathy/HFmrEF (45-50% April 2023), type 2 diabetes mellitus, hypertension, and hyperlipidemia.  As above, he has a history of CAD status post late presenting inferior STEMI December 2018. Catheterization revealed a total occlusion of the distal left circumflex and proximal RCA. EF was 25 to 30% by ventriculography and subsequently 30 to 35% by echo. The culprit was felt to be the right coronary artery and the interventional team was not able to cross this with a wire. He was subsequently medically managed. EF improved to 40 to 45% by echo in March 2019.  He had nonischemic exercise treadmill testing in March 2019 and again in February 2021.  Echo in April 2023 showed further, slight improvement in EF to 45 to 50%, with mild global hypokinesis and moderate to severe inferior and basal to mid septal hypokinesis, grade 1 diastolic dysfunction, and mild MR.     In May 2024, Lawrence Holmes was seen in clinic and was noted to have frequent PVCs on ECG.  Event monitoring was ordered though he did not follow through.  He had back surgery in August 2024 and more recently developed urinary retention and hematuria requiring clot evacuation in late September with subsequent laser enucleation of the prostate in October 2024.  Event monitor was reordered in the setting of ongoing frequent PVCs on ECG at cardiology visit in October 2024.  This showed predominantly sinus rhythm with a 14% PVC burden.  Follow-up echo shows persistent moderate LV dysfunction with an EF of 40-45%.  Today, Lawrence Holmes states that he is feeling well.  He is recovered from his back surgery and is no longer using a walker.  He tolerates physical therapy twice a week well.  He is also recovered well from his prostate surgery.  He denies chest pain, dyspnea, palpitations, PND, orthopnea, dizziness, syncope, edema, or early satiety.  He continues to smoke up to about a pack of cigarettes a day.  He is interested in quitting and was willing  to accept a prescription for Chantix. Objective  Home Medications    Current Outpatient Medications  Medication Sig Dispense Refill   ACCU-CHEK GUIDE test strip SMARTSIG:Via Meter 1-4 Times Daily     Accu-Chek Softclix Lancets lancets SMARTSIG:Via Meter 1-4 Times Daily     aspirin 81 MG chewable tablet Chew 1 tablet (81 mg total) by mouth daily. 30 tablet 2   atorvastatin (LIPITOR) 80 MG tablet Take 1 tablet (80 mg total) by mouth at bedtime. 90 tablet 3   Blood Gluc Meter Disp-Strips (BLOOD GLUCOSE METER DISPOSABLE) DEVI Check fingerstick blood sugars once a day; E11.69, LON 99 months 100 each 1   blood glucose meter kit and supplies KIT Dispense based on patient and insurance preference. Use up to four times daily as directed. (FOR ICD-9 250.00, 250.01). 1 each 0   carvedilol (COREG) 6.25 MG tablet Take 1 tablet (6.25 mg total) by mouth 2 (two) times daily with a meal. 180 tablet 3   empagliflozin (JARDIANCE) 10 MG TABS tablet Take 1 tablet (10 mg total) by mouth daily before breakfast. 90 tablet  1   metFORMIN (GLUCOPHAGE) 500 MG tablet Take 1 tablet (500 mg total) by mouth 2 (two) times daily with a meal. 180 tablet 3   sacubitril-valsartan (ENTRESTO) 24-26 MG TAKE 1 TABLET BY MOUTH TWICE DAILY 60 tablet 6   spironolactone (ALDACTONE) 25 MG tablet TAKE 1 TABLET(25 MG) BY MOUTH DAILY 90 tablet 2   No current facility-administered medications for this visit.     Physical Exam    VS:  BP 112/72 (BP Location: Left Arm, Patient Position: Sitting, Cuff Size: Normal)   Pulse 81   Ht 6' (1.829 m)   Wt 206 lb 3.2 oz (93.5 kg)   SpO2 97%   BMI 27.97 kg/m  , BMI Body mass index is 27.97 kg/m.       GEN: Well nourished, well developed, in no acute distress. HEENT: normal. Neck: Supple, no JVD, carotid bruits, or masses. Cardiac: RRR, no murmurs, rubs, or gallops. No clubbing, cyanosis, edema.  Radials 2+/PT 2+ and equal bilaterally.  Respiratory:  Respirations regular and unlabored, clear  to auscultation bilaterally. GI: Soft, nontender, nondistended, BS + x 4. MS: no deformity or atrophy. Skin: warm and dry, no rash. Neuro:  Strength and sensation are intact. Psych: Normal affect.  Accessory Clinical Findings    Lab Results  Component Value Date   WBC 8.9 07/24/2023   HGB 14.8 07/24/2023   HCT 45.0 07/24/2023   MCV 90.0 07/24/2023   PLT 284 07/24/2023   Lab Results  Component Value Date   CREATININE 1.00 07/24/2023   BUN 13 07/24/2023   NA 140 07/24/2023   K 4.4 07/24/2023   CL 104 07/24/2023   CO2 30 07/24/2023   Lab Results  Component Value Date   ALT 25 07/24/2023   AST 18 07/24/2023   ALKPHOS 57 05/07/2022   BILITOT 0.3 07/24/2023   Lab Results  Component Value Date   CHOL 113 07/24/2023   HDL 25 (L) 07/24/2023   LDLCALC 68 07/24/2023   TRIG 122 07/24/2023   CHOLHDL 4.5 07/24/2023    Lab Results  Component Value Date   HGBA1C 6.2 (H) 07/24/2023        Assessment & Plan    1.  PVCs: Recent monitoring with a 14% PVC burden.  Follow-up echo shows slight further reduction in EF to 40-45% with inferoposterior hypokinesis and grade 1 diastolic dysfunction.  He denies palpitations, presyncope, syncope, chest pain, or dyspnea.  Electrolytes earlier this month were normal.  He is currently on carvedilol 6.25 mg twice daily with limited room for titration in the setting of historically soft blood pressures.  He was agreeable to referral to electrophysiology for PVCs and LV dysfunction.  2.  Coronary artery disease: Status post inferior STEMI in 2018 at which time he was found to have total occlusions of the left circumflex and right coronary artery.  He has been medically managed.  EF previously as low as 25 to 30% and as above, 40 to 45% by recent echo.  He denies chest pain or dyspnea.  He remains on aspirin, statin, and beta-blocker therapy.  3.  Chronic heart failure with midrange ejection fraction/ischemic cardiomyopathy: EF previously as low as  25 to 30% following MI in 2018 with subsequent improvement to 45 to 50% by echo in 2021.  More recent echo in the setting of 14% PVC burden with EF of 40-45%.  He is euvolemic on examination.  He remains on beta-blocker, Entresto, spironolactone, and empagliflozin therapy.  4.  Primary  hypertension: Blood pressure historically soft but stable today.  He remains on beta-blocker, spironolactone, and Entresto.  5.  Hyperlipidemia: LDL of 68 in December with normal LFTs.  Continue statin therapy.  6.  Type 2 diabetes mellitus: A1c 6.2 earlier this month.  He is on empagliflozin and metformin and followed by primary care.  7.  Tobacco abuse: Smoking up to about a pack a day.  Discussed risks of ongoing tobacco abuse and importance of smoking cessation.  He is willing to accept a prescription for Chantix and identified Christmas as a quit date.  8.  Disposition: Referred to electrophysiology in the setting of LV dysfunction and 14% PVC burden.  Nicolasa Ducking, NP 08/01/2023, 3:33 PM

## 2023-08-03 ENCOUNTER — Telehealth: Payer: Self-pay | Admitting: Pharmacy Technician

## 2023-08-03 ENCOUNTER — Other Ambulatory Visit (HOSPITAL_COMMUNITY): Payer: Self-pay

## 2023-08-03 NOTE — Telephone Encounter (Signed)
Pharmacy Patient Advocate Encounter   Received notification from Fax that prior authorization for varencline is required/requested.   Insurance verification completed.   The patient is insured through [facility] .   Per test claim: PA required; PA submitted to above mentioned insurance via CoverMyMeds Key/confirmation #/EOC [facility] Status is pending

## 2023-08-03 NOTE — Telephone Encounter (Signed)
Pharmacy Patient Advocate Encounter  Received notification from [facility] that Prior Authorization for varencline has been APPROVED from 08/03/23 to 08/02/24. Ran test claim, Copay is $0.00 one month. This test claim was processed through [facility]- copay amounts may vary at other pharmacies due to pharmacy/plan contracts, or as the patient moves through the different stages of their insurance plan.   PA #/Case ID/Reference #: [id number]

## 2023-08-06 ENCOUNTER — Ambulatory Visit: Payer: 59

## 2023-08-06 DIAGNOSIS — R262 Difficulty in walking, not elsewhere classified: Secondary | ICD-10-CM

## 2023-08-06 DIAGNOSIS — R2681 Unsteadiness on feet: Secondary | ICD-10-CM

## 2023-08-06 DIAGNOSIS — R278 Other lack of coordination: Secondary | ICD-10-CM | POA: Diagnosis not present

## 2023-08-06 DIAGNOSIS — M6281 Muscle weakness (generalized): Secondary | ICD-10-CM

## 2023-08-06 NOTE — Therapy (Signed)
OUTPATIENT PHYSICAL THERAPY TREATMENT    Patient Name: Lawrence Holmes [place] MRN: [id number] DOB:[birth year], 67 y.o., male Today's Date: 08/06/2023  END OF SESSION:  PT End of Session - 08/06/23 1322     Visit Number 17    Number of Visits 31    Date for PT Re-Evaluation 09/21/23    Authorization Type [employer]    Progress Note Due on Visit 20    PT Start Time 1315    PT Stop Time 1358    PT Time Calculation (min) 43 min    Equipment Utilized During Treatment Gait belt    Activity Tolerance Patient tolerated treatment well;No increased pain    Behavior During Therapy [facility] for tasks assessed/performed                 Past Medical History:  Diagnosis Date   Aortic atherosclerosis (HCC)    Arthritis    BPH with urinary obstruction    Chronic systolic heart failure (HCC) 07/23/2017   a. TTE 07/23/2017: EF 30-35%, mild LVH, inferolateral and inferior AK, mild BAE, G1DD; b. TTE 10/23/2017: EF 40-45%, mild LVH, basal-mid inferior and inferoseptal AK, mild MR; c. TTE 11/19/2021: EF 45-50%, glob HK with mod to severe inferior and basal to mid septal HK, GLS -15.3%, mild MR, G1DD; Holmes. 07/2023 Echo: EF 40-45%, inf/post HK, GrI DD, nl RV fxn.   Coronary artery disease 07/21/2017   a. 07/2017 Late presenting inferior STEMI/Cath: LM nl, LAD min irregs, LCX 100d (L->L collats), RCA 100p (L->R collats), EF 25-35%-->Med Rx; b. 10/2017 ETT (DOT): Ex time 7:38, baseline antlat TWI, no acute changes; c. 09/2019 ETT: Ex time 6:49. No ECG changes.   DDD (degenerative disc disease), lumbar    a.) s/p MIS L2-3 laminectomy and disectomy   Diverticulosis of colon    Erectile dysfunction    HTN (hypertension)    Hx of bilateral cataract extraction    Hyperlipidemia    Ischemic cardiomyopathy    a. LHC 07/21/2017: EF 25-35%; b. TTE 07/23/2017: 30-35%; c. TTE 10/23/2017: EF 40-45%; Holmes. TTE 11/19/2021: EF 45-50%; e. 07/2023 Echo: EF 40-45%.   Left inguinal hernia    a.) s/p repair 05/19/2022   PVC's  (premature ventricular contractions)    a. 06/2023 72 hr Zio: Predominantly sinus rhythm at an average rate of 81 (57-1 33).  1 7 beat run of SVT at 133.  1.3% PAC burden.  14% PVC burden.   ST elevation myocardial infarction (STEMI) of inferior wall (HCC) 07/21/2017   a. LHC 07/21/2017: EF 25-35%, LVEDP 14 mmHg. 40% pLCx, 40% mLCx, 100% dLCx, 100% pRCA --> attempted to cross wire at RCA lesion, however unable. Reasonable RCA collaterals already formed --> med mgmt.   T2DM (type 2 diabetes mellitus) (HCC)    Tobacco use    Past Surgical History:  Procedure Laterality Date   CATARACT EXTRACTION Bilateral    COLONOSCOPY WITH PROPOFOL N/A 04/04/2019   Procedure: COLONOSCOPY WITH PROPOFOL;  Surgeon: Pasty Spillers, MD;  Location: [facility];  Service: Endoscopy;  Laterality: N/A;   CYSTOSCOPY WITH FULGERATION N/A 05/14/2023   Procedure: CYSTOSCOPY WITH CLOT EVACUATION AND FULGERATION;  Surgeon: Vanna Scotland, MD;  Location: [facility] OR[facility];  Service: Urology;  Laterality: N/A;   HOLEP-LASER ENUCLEATION OF THE PROSTATE WITH MORCELLATION N/A 06/08/2023   Procedure: HOLEP-LASER ENUCLEATION OF THE PROSTATE WITH MORCELLATION;  Surgeon: Sondra Come, MD;  Location: [facility] OR[facility];  Service: Urology;  Laterality: N/A;   LEFT HEART CATH AND CORONARY  ANGIOGRAPHY N/A 07/21/2017   Procedure: LEFT HEART CATH AND CORONARY ANGIOGRAPHY;  Surgeon: Iran Ouch, MD;  Location: [facility];  Service: Cardiovascular;  Laterality: N/A;   LUMBAR LAMINECTOMY/DECOMPRESSION MICRODISCECTOMY Bilateral 04/05/2023   Procedure: MINIMALLY INVASIVE (MIS) L2-3 LAMINECTOMY AND DISCECTOMY;  Surgeon: Loreen Freud, MD;  Location: [facility];  Service: Neurosurgery;  Laterality: Bilateral;   XI ROBOTIC ASSISTED INGUINAL HERNIA REPAIR WITH MESH Left 05/19/2022   Procedure: XI ROBOTIC ASSISTED INGUINAL HERNIA REPAIR WITH MESH;  Surgeon: Campbell Lerner, MD;  Location: [facility];  Service: General;  Laterality: Left;    Patient Active Problem List   Diagnosis Date Noted   S/P lumbar microdiscectomy 05/16/2023   Hematuria 05/12/2023   Acute urinary retention 05/12/2023   Bilateral hydronephrosis 05/12/2023   HTN (hypertension) 05/12/2023   HLD (hyperlipidemia) 05/12/2023   Type II diabetes mellitus with renal manifestations (HCC) 05/12/2023   Chronic kidney disease, stage 3a (HCC) 05/12/2023   Chronic systolic CHF (congestive heart failure) (HCC) 05/12/2023   BPH (benign prostatic hyperplasia) 05/12/2023   UTI (urinary tract infection) 05/12/2023   CAD (coronary artery disease) 05/12/2023   Intervertebral lumbar disc disorder with myelopathy, lumbar region 04/02/2023   Impaired functional mobility, balance, gait, and endurance 03/26/2023   Bilateral leg weakness 03/26/2023   Abnormal prostate exam 05/09/2022   Aortic atherosclerosis (HCC) 02/24/2022   Smokes with greater than 30 pack year history 11/18/2021   Coronary artery disease of native artery of native heart with stable angina pectoris (HCC) 05/19/2021   Dyslipidemia 07/30/2020   Polyp of colon    Type 2 diabetes mellitus, controlled (HCC) 02/08/2018   History of myocardial infarction 11/08/2017   Tobacco use 11/08/2017   Ischemic cardiomyopathy 11/08/2017   CHF (congestive heart failure) (HCC) 11/08/2017   Foot callus 11/08/2017   Onychomycosis of multiple toenails with type 2 diabetes mellitus (HCC) 11/08/2017   Cataract, nuclear sclerotic, left eye 04/04/2016    PCP: Danelle Berry, PA-C  REFERRING PROVIDER: Oswaldo Conroy Mecum, PA-C  REFERRING DIAG:  M51.06 (ICD-10-CM) - Intervertebral lumbar disc disorder with myelopathy, lumbar region  Z74.09 (ICD-10-CM) - Impaired functional mobility, balance, gait, and endurance  R29.898 (ICD-10-CM) - Bilateral leg weakness  Z98.890 (ICD-10-CM) - S/P lumbar microdiscectomy    Rationale for Evaluation and Treatment: Rehabilitation  THERAPY DIAG:  Other lack of coordination  Unsteadiness on  feet  Difficulty in walking, not elsewhere classified  Muscle weakness (generalized)  ONSET DATE: 03/2023  SUBJECTIVE:  SUBJECTIVE STATEMENT: Patient reports he has been out twice this past weekend and did not use cane.   PERTINENT HISTORY:  Approximately 6 weeks status post Right L2-L3 Laminectomy and microdiscectomy (04/05/23).  He states that he has graduated from his wheelchair and is almost only using his walker, trying to graduate to his cane.  Patient has a past medical history of arthritis, congestive heart failure, CID, CDD, enlarged prostate, HTN, HLD, left inguinal hernia, type 2 diabetes. 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    PAIN:  Are you having pain? No     PRECAUTIONS: Back   He can increase his weight to 25 pounds  RED FLAGS: None   WEIGHT BEARING RESTRICTIONS:  No lifting more than 25 pounds   FALLS:  Has patient fallen in last 6 months? No  LIVING ENVIRONMENT: Lives with: lives alone Lives in: House/apartment Stairs: Yes: External: 3 steps; on left going up Has following equipment at home: Single point cane and Walker - 2 wheeled, grab bars in shower   OCCUPATION: Engineer, drilling   PLOF: Independent  PATIENT GOALS: "Get back to walking without a walker or cane"   NEXT MD VISIT: 06/08/23 : prostate procedure   OBJECTIVE:  Note: Objective measures were completed at Evaluation unless otherwise noted.  DIAGNOSTIC FINDINGS:  IMPRESSION from MRI 03/26/23 1. Severe spinal canal narrowing at L2-L3 secondary to a combination of ligamentum flavum hypertrophy, short pedicles, and a disc bulge. 2. Severe left and moderate right neural foraminal narrowing at L5-S1.   PATIENT SURVEYS:   FOTO 05/29/23: 38  Typical result : 51   COGNITION: Overall cognitive status: Within functional limits for tasks assessed     SENSATION: Light touch: WFL   LOWER EXTREMITY MMT:    MMT Right eval Left eval  Hip flexion 4-/5 4-/5  Hip extension    Hip abduction 4 4  Hip adduction 4- 4  Hip internal rotation    Hip external rotation    Knee flexion 4-/5 4-/5  Knee extension 4+ 4+  Ankle dorsiflexion 4/5 4/5  Ankle plantarflexion 4-/5 4-/5  Ankle inversion    Ankle eversion     (Blank rows = not tested)    FUNCTIONAL TESTS:  5 times sit to stand: 16.59s intermittent hand use 10 meter walk test: 20.97  .80m/s with walker    TODAY'S TREATMENT: DATE: 08/06/23    NMR: in // bars   Dynamic high knee march without UE support down and back x 4. Dynamic ham curl walk in // bars without UE support- down and back x 4.  Dynamic Lateral steps up/over 4 1/2 foam rolls- down and back x 4.  -AMB overground 378ft, no SPC first 150 feet and then use of SPC on 2nd lap versus    THEREX:  -STS from 24"x3 -STS from 23" x 3 -STS from 22" x 3 -STS from 21" x 3 -STS from 19.5" x 3 -STS from 18.5"x3 (regular height chair)   -Standing Hip ext (matrix cable system) 2.5 # alt LE 2 sets x 10 reps.    PATIENT EDUCATION:  Education details: HEP, POC, goals  Person educated: Patient Education method: Explanation, Demonstration, and Handouts Education comprehension: verbalized understanding and returned demonstration  HOME EXERCISE PROGRAM: Access Code: [email or web address] URL: [email or web address] Date: 05/29/2023 Prepared by: Precious Bard  Exercises - Standing March with Counter Support  - 1 x daily - 7 x weekly - 2 sets -  10 reps - 5 hold - Heel Raises with Counter Support  - 1 x daily - 7 x weekly - 2 sets - 10 reps - 5 hold - Sit to Stand Without Arm Support  - 1 x daily - 7 x weekly - 2 sets - 10 reps - 5 hold  ASSESSMENT:  CLINICAL IMPRESSION:   Patient  performed well overall- able to stand well without any knee snapping as previous visit and no signs of myoclonus. He presented with mostly fatigue as limiting factor today. He continues to demonstrate more instability without use of cane and will continue to benefit from ongoing LE strengthening and balance for improved overall mobility. Pt will continue to benefit from skilled physical therapy intervention to address impairments, improve QOL, and attain therapy goals.    OBJECTIVE IMPAIRMENTS: Abnormal gait, decreased activity tolerance, decreased balance, decreased endurance, decreased mobility, difficulty walking, decreased strength, impaired flexibility, and improper body mechanics.   ACTIVITY LIMITATIONS: carrying, lifting, bending, standing, squatting, stairs, transfers, bed mobility, dressing, and locomotion level  PARTICIPATION LIMITATIONS: meal prep, cleaning, laundry, driving, shopping, occupation, and yard work  PERSONAL FACTORS: arthritis, congestive heart failure, CID, CDD, enlarged prostate, HTN, HLD, left inguinal hernia, type 2 diabetes are also affecting patient's functional outcome.   REHAB POTENTIAL: Good  CLINICAL DECISION MAKING: Evolving/moderate complexity  EVALUATION COMPLEXITY: Moderate   GOALS: Goals reviewed with patient? Yes  SHORT TERM GOALS: Target date: 08/24/2023    Patient will demonstrates ability to ambulate for >274ft without AD with supervision assist to allow improved access to community  Goal status: initial    LONG TERM GOALS: Target date: 09/20/2022  Patient will increase 10 meter walk test to >1.105m/s as to improve gait speed for better community ambulation and to reduce fall risk. Baseline: .47 m/s; 07/09/2023= 0.58 m/s with and without cane 12/5: 0.660m/s with SPC  Goal status: PROGRESSING  2.  Patient will increase FOTO score to equal to or greater than 51 to demonstrate statistically significant improvement in mobility and quality of  life. Baseline: 05/29/23: 38; 07/09/2023= 49 12/5:47 Goal status: PROGRESSING  3.  Patient (> 46 years old) will complete five times sit to stand test in < 15 seconds indicating an increased LE strength and improved balance. Baseline: 16.59 Intermittent hand use; 07/09/2023= 18.45 sec without UE support  12/5: 16.32 without UE Support  Goal status: PROGRESSING  4. Patient will ambulate 150 ft  without the use of an assistive device in order to increase independence and improve quality of life.  Baseline: Not tested  12/5: able to ambulate >181ft with no AD and CGA only. Mild staggering R but no LOB  goal status: in progress   5.  Patient will increase Berg Balance score by > 6 points to demonstrate decreased fall risk during functional activities. Baseline: awaiting testing until precautions are lifted; 07/10/2023= 41/56 12/5: 43/56 Goal status: in progress.   6. Pt will improved DGI without AD by 4 points to indicate increased safety and reduced fall risk and improved community access.  Baseline to be complete Holmes Status : initial. New.    PLAN:  PT FREQUENCY: 1-2x/week  PT DURATION: 8 weeks  PLANNED INTERVENTIONS: Therapeutic exercises, Therapeutic activity, Neuromuscular re-education, Balance training, Gait training, Patient/Family education, Self Care, Joint mobilization, Stair training, Vestibular training, Canalith repositioning, DME instructions, Dry Needling, Electrical stimulation, Cryotherapy, Moist heat, scar mobilization, Taping, Manual therapy, and Re-evaluation.  PLAN FOR NEXT SESSION:   Coordination, balance and BLE strengthening.  Functional gait  training.   2:26 PM, 08/06/23 Louis Meckel, PT Physical Therapist - [facility]  Outpatient Physical Therapy- Main Campus [phone]

## 2023-08-07 NOTE — Telephone Encounter (Signed)
Noted patient aware

## 2023-08-08 ENCOUNTER — Ambulatory Visit: Payer: 59

## 2023-08-08 DIAGNOSIS — R278 Other lack of coordination: Secondary | ICD-10-CM

## 2023-08-08 DIAGNOSIS — M6281 Muscle weakness (generalized): Secondary | ICD-10-CM

## 2023-08-08 DIAGNOSIS — R2681 Unsteadiness on feet: Secondary | ICD-10-CM

## 2023-08-08 DIAGNOSIS — R262 Difficulty in walking, not elsewhere classified: Secondary | ICD-10-CM

## 2023-08-08 NOTE — Therapy (Signed)
OUTPATIENT PHYSICAL THERAPY TREATMENT    Lawrence Holmes Name: Lawrence Holmes MRN: [id number] DOB:February 02, 1956, 67 y.o., male Today's Date: 08/08/2023  END OF SESSION:  Lawrence Holmes End of Session - 08/08/23 1716     Visit Number 18    Number of Visits 31    Date for Lawrence Holmes Re-Evaluation 09/21/23    Authorization Type [employer]    Progress Note Due on Visit 20    Lawrence Holmes Start Time 1445    Lawrence Holmes Stop Time 1529    Lawrence Holmes Time Calculation (min) 44 min    Equipment Utilized During Treatment Gait belt    Activity Tolerance Lawrence Holmes tolerated treatment well    Behavior During Therapy WFL for tasks assessed/performed                  Past Medical History:  Diagnosis Date   Aortic atherosclerosis (HCC)    Arthritis    BPH with urinary obstruction    Chronic systolic heart failure (HCC) 07/23/2017   a. TTE 07/23/2017: EF 30-35%, mild LVH, inferolateral and inferior AK, mild BAE, G1DD; b. TTE 10/23/2017: EF 40-45%, mild LVH, basal-mid inferior and inferoseptal AK, mild MR; c. TTE 11/19/2021: EF 45-50%, glob HK with mod to severe inferior and basal to mid septal HK, GLS -15.3%, mild MR, G1DD; d. 07/2023 Echo: EF 40-45%, inf/post HK, GrI DD, nl RV fxn.   Coronary artery disease 07/21/2017   a. 07/2017 Late presenting inferior STEMI/Cath: LM nl, LAD min irregs, LCX 100d (L->L collats), RCA 100p (L->R collats), EF 25-35%-->Med Rx; b. 10/2017 ETT (DOT): Ex time 7:38, baseline antlat TWI, no acute changes; c. 09/2019 ETT: Ex time 6:49. No ECG changes.   DDD (degenerative disc disease), lumbar    a.) s/p MIS L2-3 laminectomy and disectomy   Diverticulosis of colon    Erectile dysfunction    HTN (hypertension)    Hx of bilateral cataract extraction    Hyperlipidemia    Ischemic cardiomyopathy    a. LHC 07/21/2017: EF 25-35%; b. TTE 07/23/2017: 30-35%; c. TTE 10/23/2017: EF 40-45%; d. TTE 11/19/2021: EF 45-50%; e. 07/2023 Echo: EF 40-45%.   Left inguinal hernia    a.) s/p repair 05/19/2022   PVC's (premature  ventricular contractions)    a. 06/2023 72 hr Zio: Predominantly sinus rhythm at an average rate of 81 (57-1 33).  1 7 beat run of SVT at 133.  1.3% PAC burden.  14% PVC burden.   ST elevation myocardial infarction (STEMI) of inferior wall (HCC) 07/21/2017   a. LHC 07/21/2017: EF 25-35%, LVEDP 14 mmHg. 40% pLCx, 40% mLCx, 100% dLCx, 100% pRCA --> attempted to cross wire at RCA lesion, however unable. Reasonable RCA collaterals already formed --> med mgmt.   T2DM (type 2 diabetes mellitus) (HCC)    Tobacco use    Past Surgical History:  Procedure Laterality Date   CATARACT EXTRACTION Bilateral    COLONOSCOPY WITH PROPOFOL N/A 04/04/2019   Procedure: COLONOSCOPY WITH PROPOFOL;  Surgeon: Pasty Spillers, MD;  Location: [facility];  Service: Endoscopy;  Laterality: N/A;   CYSTOSCOPY WITH FULGERATION N/A 05/14/2023   Procedure: CYSTOSCOPY WITH CLOT EVACUATION AND FULGERATION;  Surgeon: Vanna Scotland, MD;  Location: [facility];  Service: Urology;  Laterality: N/A;   HOLEP-LASER ENUCLEATION OF THE PROSTATE WITH MORCELLATION N/A 06/08/2023   Procedure: HOLEP-LASER ENUCLEATION OF THE PROSTATE WITH MORCELLATION;  Surgeon: Sondra Come, MD;  Location: [facility];  Service: Urology;  Laterality: N/A;   LEFT HEART CATH AND CORONARY ANGIOGRAPHY  N/A 07/21/2017   Procedure: LEFT HEART CATH AND CORONARY ANGIOGRAPHY;  Surgeon: Iran Ouch, MD;  Location: [facility];  Service: Cardiovascular;  Laterality: N/A;   LUMBAR LAMINECTOMY/DECOMPRESSION MICRODISCECTOMY Bilateral 04/05/2023   Procedure: MINIMALLY INVASIVE (MIS) L2-3 LAMINECTOMY AND DISCECTOMY;  Surgeon: Loreen Freud, MD;  Location: [facility];  Service: Neurosurgery;  Laterality: Bilateral;   XI ROBOTIC ASSISTED INGUINAL HERNIA REPAIR WITH MESH Left 05/19/2022   Procedure: XI ROBOTIC ASSISTED INGUINAL HERNIA REPAIR WITH MESH;  Surgeon: Campbell Lerner, MD;  Location: [facility];  Service: General;  Laterality: Left;   Lawrence Holmes  Active Problem List   Diagnosis Date Noted   S/P lumbar microdiscectomy 05/16/2023   Hematuria 05/12/2023   Acute urinary retention 05/12/2023   Bilateral hydronephrosis 05/12/2023   HTN (hypertension) 05/12/2023   HLD (hyperlipidemia) 05/12/2023   Type II diabetes mellitus with renal manifestations (HCC) 05/12/2023   Chronic kidney disease, stage 3a (HCC) 05/12/2023   Chronic systolic CHF (congestive heart failure) (HCC) 05/12/2023   BPH (benign prostatic hyperplasia) 05/12/2023   UTI (urinary tract infection) 05/12/2023   CAD (coronary artery disease) 05/12/2023   Intervertebral lumbar disc disorder with myelopathy, lumbar region 04/02/2023   Impaired functional mobility, balance, gait, and endurance 03/26/2023   Bilateral leg weakness 03/26/2023   Abnormal prostate exam 05/09/2022   Aortic atherosclerosis (HCC) 02/24/2022   Smokes with greater than 30 pack year history 11/18/2021   Coronary artery disease of native artery of native heart with stable angina pectoris (HCC) 05/19/2021   Dyslipidemia 07/30/2020   Polyp of colon    Type 2 diabetes mellitus, controlled (HCC) 02/08/2018   History of myocardial infarction 11/08/2017   Tobacco use 11/08/2017   Ischemic cardiomyopathy 11/08/2017   CHF (congestive heart failure) (HCC) 11/08/2017   Foot callus 11/08/2017   Onychomycosis of multiple toenails with type 2 diabetes mellitus (HCC) 11/08/2017   Cataract, nuclear sclerotic, left eye 04/04/2016    PCP: Danelle Berry, PA-C  REFERRING PROVIDER: Oswaldo Conroy Mecum, PA-C  REFERRING DIAG:  M51.06 (ICD-10-CM) - Intervertebral lumbar disc disorder with myelopathy, lumbar region  Z74.09 (ICD-10-CM) - Impaired functional mobility, balance, gait, and endurance  R29.898 (ICD-10-CM) - Bilateral leg weakness  Z98.890 (ICD-10-CM) - S/P lumbar microdiscectomy    Rationale for Evaluation and Treatment: Rehabilitation  THERAPY DIAG:  Other lack of coordination  Unsteadiness on feet  Muscle  weakness (generalized)  Difficulty in walking, not elsewhere classified  ONSET DATE: 03/2023  SUBJECTIVE:  SUBJECTIVE STATEMENT: Lawrence Holmes reports no changes since previous visit. Lawrence Holmes is not in any pain and denies reports of stumbles/falls.   PERTINENT HISTORY:  Approximately 6 weeks status post Right L2-L3 Laminectomy and microdiscectomy (04/05/23).  Lawrence Holmes states that Lawrence Holmes has graduated from his wheelchair and is almost only using his Abisola Carrero, trying to graduate to his cane.  Lawrence Holmes has a past medical history of arthritis, congestive heart failure, CID, CDD, enlarged prostate, HTN, HLD, left inguinal hernia, type 2 diabetes. 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    PAIN:  Are you having pain? No     PRECAUTIONS: Back   Lawrence Holmes can increase his weight to 25 pounds  RED FLAGS: None   WEIGHT BEARING RESTRICTIONS:  No lifting more than 25 pounds   FALLS:  Has Lawrence Holmes fallen in last 6 months? No  LIVING ENVIRONMENT: Lives with: lives alone Lives in: House/apartment Stairs: Yes: External: 3 steps; on left going up Has following equipment at home: Single point cane and Otisha Spickler - 2 wheeled, grab bars in shower   OCCUPATION: Engineer, drilling   PLOF: Independent  Lawrence Holmes GOALS: "Get back to walking without a Rhylei Mcquaig or cane"   NEXT MD VISIT: 06/08/23 : prostate procedure   OBJECTIVE:  Note: Objective measures were completed at Evaluation unless otherwise noted.  DIAGNOSTIC FINDINGS:  IMPRESSION from MRI 03/26/23 1. Severe spinal canal narrowing at L2-L3 secondary to a combination of ligamentum flavum hypertrophy, short pedicles, and a disc bulge. 2. Severe left and moderate right neural foraminal narrowing at L5-S1.    Lawrence Holmes SURVEYS:  FOTO 05/29/23: 38  Typical result : 51   COGNITION: Overall cognitive status: Within functional limits for tasks assessed     SENSATION: Light touch: WFL   LOWER EXTREMITY MMT:    MMT Right eval Left eval  Hip flexion 4-/5 4-/5  Hip extension    Hip abduction 4 4  Hip adduction 4- 4  Hip internal rotation    Hip external rotation    Knee flexion 4-/5 4-/5  Knee extension 4+ 4+  Ankle dorsiflexion 4/5 4/5  Ankle plantarflexion 4-/5 4-/5  Ankle inversion    Ankle eversion     (Blank rows = not tested)    FUNCTIONAL TESTS:  5 times sit to stand: 16.59s intermittent hand use 10 meter walk test: 20.97  .30m/s with Orphia Mctigue    TODAY'S TREATMENT: DATE: 08/08/23       THEREX:  -STS 2x10, first set from elevated height (cushion), without UE support  -Seated lateral hurdle step overs 1x20 each LE -Standing hamstring curls with 4#aw  -Step ups 2x10 without UE support, clonus present on LLE NMR:  -AMB overground 420ft with 4#aw, use of SPC, last 150 ft Lawrence Holmes with less stability and trembling of LE's Standing with CGA next to support surface:  Airex pad: static stand 30 seconds x 1 trials,  Airex pad: horizontal head turns 30 seconds scanning room 10x ; cueing for arc of motion  Airex pad: one foot on 6" step one foot on dynadisc ,noticeable sway increasing demand on ankle righting reaction musculature, noticeable trembling of ankles/LE's with fatigue and challenge to maintain stability, switch legs 2x, second set with dual task of rearranging letters on whiteboard Airex pad: , hold position for 30 seconds, switch legs, 2x each LE; second set with dual task of ball catch   Lawrence Holmes requires therapeutic rest breaks throughout  Lawrence Holmes:  Holmes details:  HEP, POC, goals  Person educated: Lawrence Holmes method: Explanation, Demonstration, and Handouts Holmes comprehension: verbalized understanding and returned demonstration  HOME EXERCISE  PROGRAM: Access Code: [email or web address] URL: [email or web address] Date: 05/29/2023 Prepared by: Precious Bard  Exercises - Standing March with Counter Support  - 1 x daily - 7 x weekly - 2 sets - 10 reps - 5 hold - Heel Raises with Counter Support  - 1 x daily - 7 x weekly - 2 sets - 10 reps - 5 hold - Sit to Stand Without Arm Support  - 1 x daily - 7 x weekly - 2 sets - 10 reps - 5 hold  ASSESSMENT:  CLINICAL IMPRESSION: Lawrence Holmes presents motivated with treatment session focusing on LE strengthening and balance. Myoclonus present only with performance of step ups on LLE. Lawrence Holmes was able to tolerate > 473ft of resistive gait, but as fatigue increased ambulated with less postural stability and trembling of LE's. Lawrence Holmes will continue to benefit from skilled physical therapy intervention to address impairments, improve QOL, and attain therapy goals.    OBJECTIVE IMPAIRMENTS: Abnormal gait, decreased activity tolerance, decreased balance, decreased endurance, decreased mobility, difficulty walking, decreased strength, impaired flexibility, and improper body mechanics.   ACTIVITY LIMITATIONS: carrying, lifting, bending, standing, squatting, stairs, transfers, bed mobility, dressing, and locomotion level  PARTICIPATION LIMITATIONS: meal prep, cleaning, laundry, driving, shopping, occupation, and yard work  PERSONAL FACTORS: arthritis, congestive heart failure, CID, CDD, enlarged prostate, HTN, HLD, left inguinal hernia, type 2 diabetes are also affecting Lawrence Holmes's functional outcome.   REHAB POTENTIAL: Good  CLINICAL DECISION MAKING: Evolving/moderate complexity  EVALUATION COMPLEXITY: Moderate   GOALS: Goals reviewed with Lawrence Holmes? Yes  SHORT TERM GOALS: Target date: 08/24/2023    Lawrence Holmes will demonstrates ability to ambulate for >264ft without AD with supervision assist to allow improved access to community  Goal status: initial    LONG TERM GOALS: Target date: 09/20/2022  Lawrence Holmes  will increase 10 meter walk test to >1.76m/s as to improve gait speed for better community ambulation and to reduce fall risk. Baseline: .47 m/s; 07/09/2023= 0.58 m/s with and without cane 12/5: 0.662m/s with SPC  Goal status: PROGRESSING  2.  Lawrence Holmes will increase FOTO score to equal to or greater than 51 to demonstrate statistically significant improvement in mobility and quality of life. Baseline: 05/29/23: 38; 07/09/2023= 49 12/5:47 Goal status: PROGRESSING  3.  Lawrence Holmes (> 76 years old) will complete five times sit to stand test in < 15 seconds indicating an increased LE strength and improved balance. Baseline: 16.59 Intermittent hand use; 07/09/2023= 18.45 sec without UE support  12/5: 16.32 without UE Support  Goal status: PROGRESSING  4. Lawrence Holmes will ambulate 150 ft  without the use of an assistive device in order to increase independence and improve quality of life.  Baseline: Not tested  12/5: able to ambulate >174ft with no AD and CGA only. Mild staggering R but no LOB  goal status: in progress   5.  Lawrence Holmes will increase Berg Balance score by > 6 points to demonstrate decreased fall risk during functional activities. Baseline: awaiting testing until precautions are lifted; 07/10/2023= 41/56 12/5: 43/56 Goal status: in progress.   6. Lawrence Holmes will improved DGI without AD by 4 points to indicate increased safety and reduced fall risk and improved community access.  Baseline to be complete d Status : initial. New.    PLAN:  Lawrence Holmes FREQUENCY: 1-2x/week  Lawrence Holmes DURATION: 8 weeks  PLANNED INTERVENTIONS: Therapeutic exercises, Therapeutic activity, Neuromuscular re-Holmes, Balance training,  Gait training, Lawrence Holmes/Family Holmes, Self Care, Joint mobilization, Stair training, Vestibular training, Canalith repositioning, DME instructions, Dry Needling, Electrical stimulation, Cryotherapy, Moist heat, scar mobilization, Taping, Manual therapy, and Re-evaluation.  PLAN FOR NEXT SESSION:    Coordination, balance and BLE strengthening.  Functional gait training.   Randon Goldsmith, Student-Lawrence Holmes This entire session was performed under direct supervision and direction of a licensed therapist/therapist assistant . I have personally read, edited and approve of the note as written.  Precious Bard, Lawrence Holmes, DPT Physical Therapist - [facility]  Outpatient Physical Therapy- Main [facility] [phone]    5:18 PM, 08/08/23

## 2023-08-10 ENCOUNTER — Other Ambulatory Visit: Payer: Self-pay

## 2023-08-10 DIAGNOSIS — Z87891 Personal history of nicotine dependence: Secondary | ICD-10-CM

## 2023-08-10 DIAGNOSIS — Z122 Encounter for screening for malignant neoplasm of respiratory organs: Secondary | ICD-10-CM

## 2023-08-10 DIAGNOSIS — F17210 Nicotine dependence, cigarettes, uncomplicated: Secondary | ICD-10-CM

## 2023-08-13 ENCOUNTER — Ambulatory Visit: Payer: 59

## 2023-08-13 DIAGNOSIS — R2681 Unsteadiness on feet: Secondary | ICD-10-CM

## 2023-08-13 DIAGNOSIS — R278 Other lack of coordination: Secondary | ICD-10-CM

## 2023-08-13 DIAGNOSIS — M6281 Muscle weakness (generalized): Secondary | ICD-10-CM

## 2023-08-13 DIAGNOSIS — R262 Difficulty in walking, not elsewhere classified: Secondary | ICD-10-CM

## 2023-08-13 NOTE — Therapy (Signed)
OUTPATIENT PHYSICAL THERAPY TREATMENT    Patient Name: Lawrence Holmes MRN: [id number] DOB:[birth year], 67 y.o., male Today's Date: 08/13/2023  END OF SESSION:  PT End of Session - 08/13/23 1313     Visit Number 19    Number of Visits 31    Date for PT Re-Evaluation 09/21/23    Authorization Type [employer]    Progress Note Due on Visit 20    PT Start Time 1315    PT Stop Time 1400    PT Time Calculation (min) 45 min    Equipment Utilized During Treatment Gait belt    Activity Tolerance Patient tolerated treatment well    Behavior During Therapy WFL for tasks assessed/performed                   Past Medical History:  Diagnosis Date   Aortic atherosclerosis (HCC)    Arthritis    BPH with urinary obstruction    Chronic systolic heart failure (HCC) 07/23/2017   a. TTE 07/23/2017: EF 30-35%, mild LVH, inferolateral and inferior AK, mild BAE, G1DD; b. TTE 10/23/2017: EF 40-45%, mild LVH, basal-mid inferior and inferoseptal AK, mild MR; c. TTE 11/19/2021: EF 45-50%, glob HK with mod to severe inferior and basal to mid septal HK, GLS -15.3%, mild MR, G1DD; d. 07/2023 Echo: EF 40-45%, inf/post HK, GrI DD, nl RV fxn.   Coronary artery disease 07/21/2017   a. 07/2017 Late presenting inferior STEMI/Cath: LM nl, LAD min irregs, LCX 100d (L->L collats), RCA 100p (L->R collats), EF 25-35%-->Med Rx; b. 10/2017 ETT (DOT): Ex time 7:38, baseline antlat TWI, no acute changes; c. 09/2019 ETT: Ex time 6:49. No ECG changes.   DDD (degenerative disc disease), lumbar    a.) s/p MIS L2-3 laminectomy and disectomy   Diverticulosis of colon    Erectile dysfunction    HTN (hypertension)    Hx of bilateral cataract extraction    Hyperlipidemia    Ischemic cardiomyopathy    a. LHC 07/21/2017: EF 25-35%; b. TTE 07/23/2017: 30-35%; c. TTE 10/23/2017: EF 40-45%; d. TTE 11/19/2021: EF 45-50%; e. 07/2023 Echo: EF 40-45%.   Left inguinal hernia    a.) s/p repair 05/19/2022   PVC's (premature  ventricular contractions)    a. 06/2023 72 hr Zio: Predominantly sinus rhythm at an average rate of 81 (57-1 33).  1 7 beat run of SVT at 133.  1.3% PAC burden.  14% PVC burden.   ST elevation myocardial infarction (STEMI) of inferior wall (HCC) 07/21/2017   a. LHC 07/21/2017: EF 25-35%, LVEDP 14 mmHg. 40% pLCx, 40% mLCx, 100% dLCx, 100% pRCA --> attempted to cross wire at RCA lesion, however unable. Reasonable RCA collaterals already formed --> med mgmt.   T2DM (type 2 diabetes mellitus) (HCC)    Tobacco use    Past Surgical History:  Procedure Laterality Date   CATARACT EXTRACTION Bilateral    COLONOSCOPY WITH PROPOFOL N/A 04/04/2019   Procedure: COLONOSCOPY WITH PROPOFOL;  Surgeon: Pasty Spillers, MD;  Location: [facility];  Service: Endoscopy;  Laterality: N/A;   CYSTOSCOPY WITH FULGERATION N/A 05/14/2023   Procedure: CYSTOSCOPY WITH CLOT EVACUATION AND FULGERATION;  Surgeon: Vanna Scotland, MD;  Location: [facility];  Service: Urology;  Laterality: N/A;   HOLEP-LASER ENUCLEATION OF THE PROSTATE WITH MORCELLATION N/A 06/08/2023   Procedure: HOLEP-LASER ENUCLEATION OF THE PROSTATE WITH MORCELLATION;  Surgeon: Sondra Come, MD;  Location: [facility];  Service: Urology;  Laterality: N/A;   LEFT HEART CATH AND CORONARY  ANGIOGRAPHY N/A 07/21/2017   Procedure: LEFT HEART CATH AND CORONARY ANGIOGRAPHY;  Surgeon: Iran Ouch, MD;  Location: [facility];  Service: Cardiovascular;  Laterality: N/A;   LUMBAR LAMINECTOMY/DECOMPRESSION MICRODISCECTOMY Bilateral 04/05/2023   Procedure: MINIMALLY INVASIVE (MIS) L2-3 LAMINECTOMY AND DISCECTOMY;  Surgeon: Loreen Freud, MD;  Location: [facility];  Service: Neurosurgery;  Laterality: Bilateral;   XI ROBOTIC ASSISTED INGUINAL HERNIA REPAIR WITH MESH Left 05/19/2022   Procedure: XI ROBOTIC ASSISTED INGUINAL HERNIA REPAIR WITH MESH;  Surgeon: Campbell Lerner, MD;  Location: [facility];  Service: General;  Laterality: Left;   Patient  Active Problem List   Diagnosis Date Noted   S/P lumbar microdiscectomy 05/16/2023   Hematuria 05/12/2023   Acute urinary retention 05/12/2023   Bilateral hydronephrosis 05/12/2023   HTN (hypertension) 05/12/2023   HLD (hyperlipidemia) 05/12/2023   Type II diabetes mellitus with renal manifestations (HCC) 05/12/2023   Chronic kidney disease, stage 3a (HCC) 05/12/2023   Chronic systolic CHF (congestive heart failure) (HCC) 05/12/2023   BPH (benign prostatic hyperplasia) 05/12/2023   UTI (urinary tract infection) 05/12/2023   CAD (coronary artery disease) 05/12/2023   Intervertebral lumbar disc disorder with myelopathy, lumbar region 04/02/2023   Impaired functional mobility, balance, gait, and endurance 03/26/2023   Bilateral leg weakness 03/26/2023   Abnormal prostate exam 05/09/2022   Aortic atherosclerosis (HCC) 02/24/2022   Smokes with greater than 30 pack year history 11/18/2021   Coronary artery disease of native artery of native heart with stable angina pectoris (HCC) 05/19/2021   Dyslipidemia 07/30/2020   Polyp of colon    Type 2 diabetes mellitus, controlled (HCC) 02/08/2018   History of myocardial infarction 11/08/2017   Tobacco use 11/08/2017   Ischemic cardiomyopathy 11/08/2017   CHF (congestive heart failure) (HCC) 11/08/2017   Foot callus 11/08/2017   Onychomycosis of multiple toenails with type 2 diabetes mellitus (HCC) 11/08/2017   Cataract, nuclear sclerotic, left eye 04/04/2016    PCP: Danelle Berry, PA-C  REFERRING PROVIDER: Oswaldo Conroy Mecum, PA-C  REFERRING DIAG:  M51.06 (ICD-10-CM) - Intervertebral lumbar disc disorder with myelopathy, lumbar region  Z74.09 (ICD-10-CM) - Impaired functional mobility, balance, gait, and endurance  R29.898 (ICD-10-CM) - Bilateral leg weakness  Z98.890 (ICD-10-CM) - S/P lumbar microdiscectomy    Rationale for Evaluation and Treatment: Rehabilitation  THERAPY DIAG:  Other lack of coordination  Unsteadiness on feet  Muscle  weakness (generalized)  Difficulty in walking, not elsewhere classified  ONSET DATE: 03/2023  SUBJECTIVE:  SUBJECTIVE STATEMENT: Im doing okay- Legs are not excellent but not too bad either.    PERTINENT HISTORY:  Approximately 6 weeks status post Right L2-L3 Laminectomy and microdiscectomy (04/05/23).  He states that he has graduated from his wheelchair and is almost only using his walker, trying to graduate to his cane.  Patient has a past medical history of arthritis, congestive heart failure, CID, CDD, enlarged prostate, HTN, HLD, left inguinal hernia, type 2 diabetes. 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    PAIN:  Are you having pain? No     PRECAUTIONS: Back   He can increase his weight to 25 pounds  RED FLAGS: None   WEIGHT BEARING RESTRICTIONS:  No lifting more than 25 pounds   FALLS:  Has patient fallen in last 6 months? No  LIVING ENVIRONMENT: Lives with: lives alone Lives in: House/apartment Stairs: Yes: External: 3 steps; on left going up Has following equipment at home: Single point cane and Walker - 2 wheeled, grab bars in shower   OCCUPATION: Engineer, drilling   PLOF: Independent  PATIENT GOALS: "Get back to walking without a walker or cane"   NEXT MD VISIT: 06/08/23 : prostate procedure   OBJECTIVE:  Note: Objective measures were completed at Evaluation unless otherwise noted.  DIAGNOSTIC FINDINGS:  IMPRESSION from MRI 03/26/23 1. Severe spinal canal narrowing at L2-L3 secondary to a combination of ligamentum flavum hypertrophy, short pedicles, and a disc bulge. 2. Severe left and moderate right neural foraminal narrowing at L5-S1.   PATIENT SURVEYS:  FOTO 05/29/23: 38  Typical  result : 51   COGNITION: Overall cognitive status: Within functional limits for tasks assessed     SENSATION: Light touch: WFL   LOWER EXTREMITY MMT:    MMT Right eval Left eval  Hip flexion 4-/5 4-/5  Hip extension    Hip abduction 4 4  Hip adduction 4- 4  Hip internal rotation    Hip external rotation    Knee flexion 4-/5 4-/5  Knee extension 4+ 4+  Ankle dorsiflexion 4/5 4/5  Ankle plantarflexion 4-/5 4-/5  Ankle inversion    Ankle eversion     (Blank rows = not tested)    FUNCTIONAL TESTS:  5 times sit to stand: 16.59s intermittent hand use 10 meter walk test: 20.97  .25m/s with walker    TODAY'S TREATMENT: DATE: 08/13/23   NMR:  Dynamic step tap without UE support onto 6" block- 15 reps alt LE's with 4# AW- Patient reported as Medium and demonstrated mild unsteadiness.   Lateral step up/down then repeat back opp direction- x 10 reps with 4#AW each with minimal UE support  Resistive gait- 4# x 150 feet and another 150 feet with SPC- much smoother walk with less unsteadiness.   Standing on 1 LE while performing hip swings (CW then CCW) with 4#AW -1 set of 10 reps with each LE CW then 10 reps each LE CCW- min UE Support - Patient reported as fatiguing.   Forward step up/over 1/2 foam rolls x 4 with 4#AW  in // bars without UE Support - down and back x 6 (1 episode of RLE knee buckling on last lap- min A to recover)  Retro gait with 4# AW in // bars - down and back x 4 (counting steps to maintain step length count of 8)    THEREX:     -STS 2x10= without UE support  Pt requires therapeutic rest breaks throughout  PATIENT EDUCATION:  Education details: HEP, POC, goals  Person educated: Patient Education method: Explanation, Demonstration, and Handouts Education comprehension: verbalized understanding and returned demonstration  HOME EXERCISE PROGRAM: Access Code: [email or web address] URL: [email or web address] Date: 05/29/2023 Prepared by:  Precious Bard  Exercises - Standing March with Counter Support  - 1 x daily - 7 x weekly - 2 sets - 10 reps - 5 hold - Heel Raises with Counter Support  - 1 x daily - 7 x weekly - 2 sets - 10 reps - 5 hold - Sit to Stand Without Arm Support  - 1 x daily - 7 x weekly - 2 sets - 10 reps - 5 hold  ASSESSMENT:  CLINICAL IMPRESSION: Treatment continued to focus on LE strengthening and coordination. He performed well with resistance today- able to step up/over with some LE fatigue yet no LOB today. He continues to present with more steady gait using cane for now.  Pt will continue to benefit from skilled physical therapy intervention to address impairments, improve QOL, and attain therapy goals.    OBJECTIVE IMPAIRMENTS: Abnormal gait, decreased activity tolerance, decreased balance, decreased endurance, decreased mobility, difficulty walking, decreased strength, impaired flexibility, and improper body mechanics.   ACTIVITY LIMITATIONS: carrying, lifting, bending, standing, squatting, stairs, transfers, bed mobility, dressing, and locomotion level  PARTICIPATION LIMITATIONS: meal prep, cleaning, laundry, driving, shopping, occupation, and yard work  PERSONAL FACTORS: arthritis, congestive heart failure, CID, CDD, enlarged prostate, HTN, HLD, left inguinal hernia, type 2 diabetes are also affecting patient's functional outcome.   REHAB POTENTIAL: Good  CLINICAL DECISION MAKING: Evolving/moderate complexity  EVALUATION COMPLEXITY: Moderate   GOALS: Goals reviewed with patient? Yes  SHORT TERM GOALS: Target date: 08/24/2023    Patient will demonstrates ability to ambulate for >276ft without AD with supervision assist to allow improved access to community  Goal status: initial    LONG TERM GOALS: Target date: 09/20/2022  Patient will increase 10 meter walk test to >1.13m/s as to improve gait speed for better community ambulation and to reduce fall risk. Baseline: .47 m/s; 07/09/2023= 0.58  m/s with and without cane 12/5: 0.613m/s with SPC  Goal status: PROGRESSING  2.  Patient will increase FOTO score to equal to or greater than 51 to demonstrate statistically significant improvement in mobility and quality of life. Baseline: 05/29/23: 38; 07/09/2023= 49 12/5:47 Goal status: PROGRESSING  3.  Patient (> 110 years old) will complete five times sit to stand test in < 15 seconds indicating an increased LE strength and improved balance. Baseline: 16.59 Intermittent hand use; 07/09/2023= 18.45 sec without UE support  12/5: 16.32 without UE Support  Goal status: PROGRESSING  4. Patient will ambulate 150 ft  without the use of an assistive device in order to increase independence and improve quality of life.  Baseline: Not tested  12/5: able to ambulate >17ft with no AD and CGA only. Mild staggering R but no LOB  goal status: in progress   5.  Patient will increase Berg Balance score by > 6 points to demonstrate decreased fall risk during functional activities. Baseline: awaiting testing until precautions are lifted; 07/10/2023= 41/56 12/5: 43/56 Goal status: in progress.   6. Pt will improved DGI without AD by 4 points to indicate increased safety and reduced fall risk and improved community access.  Baseline to be complete d Status : initial. New.    PLAN:  PT FREQUENCY: 1-2x/week  PT DURATION: 8 weeks  PLANNED INTERVENTIONS: Therapeutic  exercises, Therapeutic activity, Neuromuscular re-education, Balance training, Gait training, Patient/Family education, Self Care, Joint mobilization, Stair training, Vestibular training, Canalith repositioning, DME instructions, Dry Needling, Electrical stimulation, Cryotherapy, Moist heat, scar mobilization, Taping, Manual therapy, and Re-evaluation.  PLAN FOR NEXT SESSION:  Patient to return 4# AW (accidentally left on patient today)- Called him and he is to bring in next visit. Coordination, balance and BLE strengthening.   Functional gait training.   Louis Meckel, PT Physical Therapist - [facility]  Outpatient Physical Therapy- [facility] [phone]    4:07 PM, 08/13/23

## 2023-08-16 ENCOUNTER — Ambulatory Visit: Payer: 59 | Admitting: Urology

## 2023-08-16 ENCOUNTER — Ambulatory Visit: Payer: 59

## 2023-08-16 DIAGNOSIS — R262 Difficulty in walking, not elsewhere classified: Secondary | ICD-10-CM

## 2023-08-16 DIAGNOSIS — R278 Other lack of coordination: Secondary | ICD-10-CM

## 2023-08-16 DIAGNOSIS — M6281 Muscle weakness (generalized): Secondary | ICD-10-CM

## 2023-08-16 DIAGNOSIS — R2681 Unsteadiness on feet: Secondary | ICD-10-CM

## 2023-08-16 NOTE — Therapy (Signed)
OUTPATIENT PHYSICAL THERAPY TREATMENT    Patient Name: Lawrence Holmes MRN: [id number] DOB:Sep 01, 1955, 67 y.o., male Today's Date: 08/16/2023  END OF SESSION:  PT End of Session - 08/16/23 1404     Visit Number 20    Number of Visits 31    Date for PT Re-Evaluation 09/21/23    Authorization Type [employer]    Progress Note Due on Visit 20    PT Start Time 1404    PT Stop Time 1445    PT Time Calculation (min) 41 min    Equipment Utilized During Treatment Gait belt    Activity Tolerance Patient tolerated treatment well    Behavior During Therapy WFL for tasks assessed/performed              Past Medical History:  Diagnosis Date   Aortic atherosclerosis (HCC)    Arthritis    BPH with urinary obstruction    Chronic systolic heart failure (HCC) 07/23/2017   a. TTE 07/23/2017: EF 30-35%, mild LVH, inferolateral and inferior AK, mild BAE, G1DD; b. TTE 10/23/2017: EF 40-45%, mild LVH, basal-mid inferior and inferoseptal AK, mild MR; c. TTE 11/19/2021: EF 45-50%, glob HK with mod to severe inferior and basal to mid septal HK, GLS -15.3%, mild MR, G1DD; d. 07/2023 Echo: EF 40-45%, inf/post HK, GrI DD, nl RV fxn.   Coronary artery disease 07/21/2017   a. 07/2017 Late presenting inferior STEMI/Cath: LM nl, LAD min irregs, LCX 100d (L->L collats), RCA 100p (L->R collats), EF 25-35%-->Med Rx; b. 10/2017 ETT (DOT): Ex time 7:38, baseline antlat TWI, no acute changes; c. 09/2019 ETT: Ex time 6:49. No ECG changes.   DDD (degenerative disc disease), lumbar    a.) s/p MIS L2-3 laminectomy and disectomy   Diverticulosis of colon    Erectile dysfunction    HTN (hypertension)    Hx of bilateral cataract extraction    Hyperlipidemia    Ischemic cardiomyopathy    a. LHC 07/21/2017: EF 25-35%; b. TTE 07/23/2017: 30-35%; c. TTE 10/23/2017: EF 40-45%; d. TTE 11/19/2021: EF 45-50%; e. 07/2023 Echo: EF 40-45%.   Left inguinal hernia    a.) s/p repair 05/19/2022   PVC's (premature ventricular  contractions)    a. 06/2023 72 hr Zio: Predominantly sinus rhythm at an average rate of 81 (57-1 33).  1 7 beat run of SVT at 133.  1.3% PAC burden.  14% PVC burden.   ST elevation myocardial infarction (STEMI) of inferior wall (HCC) 07/21/2017   a. LHC 07/21/2017: EF 25-35%, LVEDP 14 mmHg. 40% pLCx, 40% mLCx, 100% dLCx, 100% pRCA --> attempted to cross wire at RCA lesion, however unable. Reasonable RCA collaterals already formed --> med mgmt.   T2DM (type 2 diabetes mellitus) (HCC)    Tobacco use    Past Surgical History:  Procedure Laterality Date   CATARACT EXTRACTION Bilateral    COLONOSCOPY WITH PROPOFOL N/A 04/04/2019   Procedure: COLONOSCOPY WITH PROPOFOL;  Surgeon: Pasty Spillers, MD;  Location: [facility];  Service: Endoscopy;  Laterality: N/A;   CYSTOSCOPY WITH FULGERATION N/A 05/14/2023   Procedure: CYSTOSCOPY WITH CLOT EVACUATION AND FULGERATION;  Surgeon: Vanna Scotland, MD;  Location: [facility];  Service: Urology;  Laterality: N/A;   HOLEP-LASER ENUCLEATION OF THE PROSTATE WITH MORCELLATION N/A 06/08/2023   Procedure: HOLEP-LASER ENUCLEATION OF THE PROSTATE WITH MORCELLATION;  Surgeon: Sondra Come, MD;  Location: [facility];  Service: Urology;  Laterality: N/A;   LEFT HEART CATH AND CORONARY ANGIOGRAPHY N/A 07/21/2017  Procedure: LEFT HEART CATH AND CORONARY ANGIOGRAPHY;  Surgeon: Iran Ouch, MD;  Location: [facility];  Service: Cardiovascular;  Laterality: N/A;   LUMBAR LAMINECTOMY/DECOMPRESSION MICRODISCECTOMY Bilateral 04/05/2023   Procedure: MINIMALLY INVASIVE (MIS) L2-3 LAMINECTOMY AND DISCECTOMY;  Surgeon: Loreen Freud, MD;  Location: [facility];  Service: Neurosurgery;  Laterality: Bilateral;   XI ROBOTIC ASSISTED INGUINAL HERNIA REPAIR WITH MESH Left 05/19/2022   Procedure: XI ROBOTIC ASSISTED INGUINAL HERNIA REPAIR WITH MESH;  Surgeon: Campbell Lerner, MD;  Location: [facility];  Service: General;  Laterality: Left;   Patient Active  Problem List   Diagnosis Date Noted   S/P lumbar microdiscectomy 05/16/2023   Hematuria 05/12/2023   Acute urinary retention 05/12/2023   Bilateral hydronephrosis 05/12/2023   HTN (hypertension) 05/12/2023   HLD (hyperlipidemia) 05/12/2023   Type II diabetes mellitus with renal manifestations (HCC) 05/12/2023   Chronic kidney disease, stage 3a (HCC) 05/12/2023   Chronic systolic CHF (congestive heart failure) (HCC) 05/12/2023   BPH (benign prostatic hyperplasia) 05/12/2023   UTI (urinary tract infection) 05/12/2023   CAD (coronary artery disease) 05/12/2023   Intervertebral lumbar disc disorder with myelopathy, lumbar region 04/02/2023   Impaired functional mobility, balance, gait, and endurance 03/26/2023   Bilateral leg weakness 03/26/2023   Abnormal prostate exam 05/09/2022   Aortic atherosclerosis (HCC) 02/24/2022   Smokes with greater than 30 pack year history 11/18/2021   Coronary artery disease of native artery of native heart with stable angina pectoris (HCC) 05/19/2021   Dyslipidemia 07/30/2020   Polyp of colon    Type 2 diabetes mellitus, controlled (HCC) 02/08/2018   History of myocardial infarction 11/08/2017   Tobacco use 11/08/2017   Ischemic cardiomyopathy 11/08/2017   CHF (congestive heart failure) (HCC) 11/08/2017   Foot callus 11/08/2017   Onychomycosis of multiple toenails with type 2 diabetes mellitus (HCC) 11/08/2017   Cataract, nuclear sclerotic, left eye 04/04/2016    PCP: Danelle Berry, PA-C  REFERRING PROVIDER: Oswaldo Conroy Mecum, PA-C  REFERRING DIAG:  M51.06 (ICD-10-CM) - Intervertebral lumbar disc disorder with myelopathy, lumbar region  Z74.09 (ICD-10-CM) - Impaired functional mobility, balance, gait, and endurance  R29.898 (ICD-10-CM) - Bilateral leg weakness  Z98.890 (ICD-10-CM) - S/P lumbar microdiscectomy    Rationale for Evaluation and Treatment: Rehabilitation  THERAPY DIAG:  No diagnosis found.  ONSET DATE: 03/2023  SUBJECTIVE:                                                                                                                                                                                            SUBJECTIVE STATEMENT:  Pt reports he had a good Christmas,  noting that he was glad it was over and that he was broke now.  Pt reports his feet have a little tingling, but otherwise he is doing well.    PERTINENT HISTORY:  Approximately 6 weeks status post Right L2-L3 Laminectomy and microdiscectomy (04/05/23).  He states that he has graduated from his wheelchair and is almost only using his walker, trying to graduate to his cane.  Patient has a past medical history of arthritis, congestive heart failure, CID, CDD, enlarged prostate, HTN, HLD, left inguinal hernia, type 2 diabetes. 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    PAIN:  Are you having pain? No     PRECAUTIONS: Back   He can increase his weight to 25 pounds  RED FLAGS: None   WEIGHT BEARING RESTRICTIONS:  No lifting more than 25 pounds   FALLS:  Has patient fallen in last 6 months? No  LIVING ENVIRONMENT: Lives with: lives alone Lives in: House/apartment Stairs: Yes: External: 3 steps; on left going up Has following equipment at home: Single point cane and Walker - 2 wheeled, grab bars in shower   OCCUPATION: Engineer, drilling   PLOF: Independent  PATIENT GOALS: "Get back to walking without a walker or cane"   NEXT MD VISIT: 06/08/23 : prostate procedure   OBJECTIVE:  Note: Objective measures were completed at Evaluation unless otherwise noted.  DIAGNOSTIC FINDINGS:  IMPRESSION from MRI 03/26/23 1. Severe spinal canal narrowing at L2-L3 secondary to a combination of ligamentum flavum hypertrophy, short pedicles, and a disc bulge. 2. Severe left and moderate right neural foraminal narrowing at L5-S1.   PATIENT SURVEYS:  FOTO 05/29/23: 38  Typical result  : 51   COGNITION: Overall cognitive status: Within functional limits for tasks assessed     SENSATION: Light touch: WFL   LOWER EXTREMITY MMT:    MMT Right eval Left eval  Hip flexion 4-/5 4-/5  Hip extension    Hip abduction 4 4  Hip adduction 4- 4  Hip internal rotation    Hip external rotation    Knee flexion 4-/5 4-/5  Knee extension 4+ 4+  Ankle dorsiflexion 4/5 4/5  Ankle plantarflexion 4-/5 4-/5  Ankle inversion    Ankle eversion     (Blank rows = not tested)    FUNCTIONAL TESTS:  5 times sit to stand: 16.59s intermittent hand use 10 meter walk test: 20.97  .86m/s with walker    TODAY'S TREATMENT: DATE: 08/16/23    TherAct:   Goal assessment performed and noted below.  Pt has not yet completed DGI likely due to time constraints in the past and has not been performed today.  Will re-attempt at later date.    Pt requires therapeutic rest breaks throughout  PATIENT EDUCATION:  Education details: HEP, POC, goals  Person educated: Patient Education method: Explanation, Demonstration, and Handouts Education comprehension: verbalized understanding and returned demonstration  HOME EXERCISE PROGRAM: Access Code: [email or web address] URL: [email or web address] Date: 05/29/2023 Prepared by: Precious Bard  Exercises - Standing March with Counter Support  - 1 x daily - 7 x weekly - 2 sets - 10 reps - 5 hold - Heel Raises with Counter Support  - 1 x daily - 7 x weekly - 2 sets - 10 reps - 5 hold - Sit to Stand Without Arm Support  - 1 x daily - 7 x weekly - 2 sets - 10 reps - 5 hold  ASSESSMENT:  CLINICAL IMPRESSION:  Pt seen for progress report note due to being the 20th visit.  Pt went through goal assessment and performed well, progressing in most of all the goals.  Pt able to achieve 1/6 goals, with progression made on 4/6 goals and the final goal needing to be performed at next session.  Patient's condition has the potential to improve in response  to therapy. Maximum improvement is yet to be obtained. The anticipated improvement is attainable and reasonable in a generally predictable time.   Pt will continue to benefit from skilled therapy to address remaining deficits in order to improve overall QoL and return to PLOF.      OBJECTIVE IMPAIRMENTS: Abnormal gait, decreased activity tolerance, decreased balance, decreased endurance, decreased mobility, difficulty walking, decreased strength, impaired flexibility, and improper body mechanics.   ACTIVITY LIMITATIONS: carrying, lifting, bending, standing, squatting, stairs, transfers, bed mobility, dressing, and locomotion level  PARTICIPATION LIMITATIONS: meal prep, cleaning, laundry, driving, shopping, occupation, and yard work  PERSONAL FACTORS: arthritis, congestive heart failure, CID, CDD, enlarged prostate, HTN, HLD, left inguinal hernia, type 2 diabetes are also affecting patient's functional outcome.   REHAB POTENTIAL: Good  CLINICAL DECISION MAKING: Evolving/moderate complexity  EVALUATION COMPLEXITY: Moderate   GOALS: Goals reviewed with patient? Yes  SHORT TERM GOALS: Target date: 08/24/2023    Patient will demonstrates ability to ambulate for >270ft without AD with supervision assist to allow improved access to community  Goal status: initial    LONG TERM GOALS: Target date: 09/20/2022  Patient will increase 10 meter walk test to >1.78m/s as to improve gait speed for better community ambulation and to reduce fall risk. Baseline: .47 m/s; 07/09/2023= 0.58 m/s with and without cane 07/26/23: 0.644m/s with [facility]  08/16/23: 0.74 m/s without SPC; 0.92 m/s with SPC Goal status: PROGRESSING  2.  Patient will increase FOTO score to equal to or greater than 51 to demonstrate statistically significant improvement in mobility and quality of life. Baseline: 05/29/23: 38 07/09/2023= 49 07/26/23:47 08/16/23: 47 Goal status: PROGRESSING  3.  Patient (> 39 years old) will complete  five times sit to stand test in < 15 seconds indicating an increased LE strength and improved balance. Baseline: 16.59 Intermittent hand use; 07/09/2023= 18.45 sec without UE support  07/26/23: 16.32 without UE Support  08/16/23: 11.70 without UE support Goal status: MET  4. Patient will ambulate 150 ft without the use of an assistive device in order to increase independence and improve quality of life.  Baseline: Not tested  07/26/23: able to ambulate >181ft with no AD and CGA only. Mild staggering R but no LOB  08/16/23: Pt able to ambulate 300' with no AD and CGA only.   goal status: PROGRESSING  5.  Patient will increase Berg Balance score by > 6 points to demonstrate decreased fall risk during functional activities. Baseline: awaiting testing until precautions are lifted; 07/10/2023= 41/56 07/26/23: 43/56 08/16/23: 48/56 Goal status: PROGRESSING  6. Pt will improved DGI without AD by 4 points to indicate increased safety and reduced fall risk and improved community access.  Baseline: to be completed 08/16/23:  Status : NEW   PLAN:  PT FREQUENCY: 1-2x/week  PT DURATION: 8 weeks  PLANNED INTERVENTIONS: Therapeutic exercises, Therapeutic activity, Neuromuscular re-education, Balance training, Gait training, Patient/Family education, Self Care, Joint mobilization, Stair training, Vestibular training, Canalith repositioning, DME instructions, Dry Needling, Electrical stimulation, Cryotherapy, Moist heat, scar mobilization, Taping, Manual therapy, and Re-evaluation.  PLAN FOR NEXT SESSION: Attempt to do DGI at next visit.  Coordination, balance and BLE strengthening.  Functional gait training.    Nolon Bussing, PT, DPT Physical Therapist - [facility]  08/16/23, 2:06 PM

## 2023-08-20 ENCOUNTER — Ambulatory Visit: Payer: 59

## 2023-08-20 DIAGNOSIS — R2681 Unsteadiness on feet: Secondary | ICD-10-CM

## 2023-08-20 DIAGNOSIS — M6281 Muscle weakness (generalized): Secondary | ICD-10-CM

## 2023-08-20 DIAGNOSIS — R262 Difficulty in walking, not elsewhere classified: Secondary | ICD-10-CM

## 2023-08-20 DIAGNOSIS — R278 Other lack of coordination: Secondary | ICD-10-CM

## 2023-08-20 NOTE — Therapy (Signed)
OUTPATIENT PHYSICAL THERAPY TREATMENT    Patient Name: Lawrence Holmes MRN: [id number] DOB:November 22, 1955, 67 y.o., male Today's Date: 08/20/2023  END OF SESSION:  PT End of Session - 08/20/23 1518     Visit Number 21    Number of Visits 31    Date for PT Re-Evaluation 09/21/23    Authorization Type [employer]    Progress Note Due on Visit 20    PT Start Time 1520    PT Stop Time 1603    PT Time Calculation (min) 43 min    Equipment Utilized During Treatment Gait belt    Activity Tolerance Patient tolerated treatment well    Behavior During Therapy WFL for tasks assessed/performed               Past Medical History:  Diagnosis Date   Aortic atherosclerosis (HCC)    Arthritis    BPH with urinary obstruction    Chronic systolic heart failure (HCC) 07/23/2017   a. TTE 07/23/2017: EF 30-35%, mild LVH, inferolateral and inferior AK, mild BAE, G1DD; b. TTE 10/23/2017: EF 40-45%, mild LVH, basal-mid inferior and inferoseptal AK, mild MR; c. TTE 11/19/2021: EF 45-50%, glob HK with mod to severe inferior and basal to mid septal HK, GLS -15.3%, mild MR, G1DD; d. 07/2023 Echo: EF 40-45%, inf/post HK, GrI DD, nl RV fxn.   Coronary artery disease 07/21/2017   a. 07/2017 Late presenting inferior STEMI/Cath: LM nl, LAD min irregs, LCX 100d (L->L collats), RCA 100p (L->R collats), EF 25-35%-->Med Rx; b. 10/2017 ETT (DOT): Ex time 7:38, baseline antlat TWI, no acute changes; c. 09/2019 ETT: Ex time 6:49. No ECG changes.   DDD (degenerative disc disease), lumbar    a.) s/p MIS L2-3 laminectomy and disectomy   Diverticulosis of colon    Erectile dysfunction    HTN (hypertension)    Hx of bilateral cataract extraction    Hyperlipidemia    Ischemic cardiomyopathy    a. LHC 07/21/2017: EF 25-35%; b. TTE 07/23/2017: 30-35%; c. TTE 10/23/2017: EF 40-45%; d. TTE 11/19/2021: EF 45-50%; e. 07/2023 Echo: EF 40-45%.   Left inguinal hernia    a.) s/p repair 05/19/2022   PVC's (premature ventricular  contractions)    a. 06/2023 72 hr Zio: Predominantly sinus rhythm at an average rate of 81 (57-1 33).  1 7 beat run of SVT at 133.  1.3% PAC burden.  14% PVC burden.   ST elevation myocardial infarction (STEMI) of inferior wall (HCC) 07/21/2017   a. LHC 07/21/2017: EF 25-35%, LVEDP 14 mmHg. 40% pLCx, 40% mLCx, 100% dLCx, 100% pRCA --> attempted to cross wire at RCA lesion, however unable. Reasonable RCA collaterals already formed --> med mgmt.   T2DM (type 2 diabetes mellitus) (HCC)    Tobacco use    Past Surgical History:  Procedure Laterality Date   CATARACT EXTRACTION Bilateral    COLONOSCOPY WITH PROPOFOL N/A 04/04/2019   Procedure: COLONOSCOPY WITH PROPOFOL;  Surgeon: Pasty Spillers, MD;  Location: [facility];  Service: Endoscopy;  Laterality: N/A;   CYSTOSCOPY WITH FULGERATION N/A 05/14/2023   Procedure: CYSTOSCOPY WITH CLOT EVACUATION AND FULGERATION;  Surgeon: Vanna Scotland, MD;  Location: [facility];  Service: Urology;  Laterality: N/A;   HOLEP-LASER ENUCLEATION OF THE PROSTATE WITH MORCELLATION N/A 06/08/2023   Procedure: HOLEP-LASER ENUCLEATION OF THE PROSTATE WITH MORCELLATION;  Surgeon: Sondra Come, MD;  Location: [facility];  Service: Urology;  Laterality: N/A;   LEFT HEART CATH AND CORONARY ANGIOGRAPHY N/A 07/21/2017  Procedure: LEFT HEART CATH AND CORONARY ANGIOGRAPHY;  Surgeon: Iran Ouch, MD;  Location: [facility];  Service: Cardiovascular;  Laterality: N/A;   LUMBAR LAMINECTOMY/DECOMPRESSION MICRODISCECTOMY Bilateral 04/05/2023   Procedure: MINIMALLY INVASIVE (MIS) L2-3 LAMINECTOMY AND DISCECTOMY;  Surgeon: Loreen Freud, MD;  Location: [facility];  Service: Neurosurgery;  Laterality: Bilateral;   XI ROBOTIC ASSISTED INGUINAL HERNIA REPAIR WITH MESH Left 05/19/2022   Procedure: XI ROBOTIC ASSISTED INGUINAL HERNIA REPAIR WITH MESH;  Surgeon: Campbell Lerner, MD;  Location: [facility];  Service: General;  Laterality: Left;   Patient Active  Problem List   Diagnosis Date Noted   S/P lumbar microdiscectomy 05/16/2023   Hematuria 05/12/2023   Acute urinary retention 05/12/2023   Bilateral hydronephrosis 05/12/2023   HTN (hypertension) 05/12/2023   HLD (hyperlipidemia) 05/12/2023   Type II diabetes mellitus with renal manifestations (HCC) 05/12/2023   Chronic kidney disease, stage 3a (HCC) 05/12/2023   Chronic systolic CHF (congestive heart failure) (HCC) 05/12/2023   BPH (benign prostatic hyperplasia) 05/12/2023   UTI (urinary tract infection) 05/12/2023   CAD (coronary artery disease) 05/12/2023   Intervertebral lumbar disc disorder with myelopathy, lumbar region 04/02/2023   Impaired functional mobility, balance, gait, and endurance 03/26/2023   Bilateral leg weakness 03/26/2023   Abnormal prostate exam 05/09/2022   Aortic atherosclerosis (HCC) 02/24/2022   Smokes with greater than 30 pack year history 11/18/2021   Coronary artery disease of native artery of native heart with stable angina pectoris (HCC) 05/19/2021   Dyslipidemia 07/30/2020   Polyp of colon    Type 2 diabetes mellitus, controlled (HCC) 02/08/2018   History of myocardial infarction 11/08/2017   Tobacco use 11/08/2017   Ischemic cardiomyopathy 11/08/2017   CHF (congestive heart failure) (HCC) 11/08/2017   Foot callus 11/08/2017   Onychomycosis of multiple toenails with type 2 diabetes mellitus (HCC) 11/08/2017   Cataract, nuclear sclerotic, left eye 04/04/2016    PCP: Danelle Berry, PA-C  REFERRING PROVIDER: Oswaldo Conroy Mecum, PA-C  REFERRING DIAG:  M51.06 (ICD-10-CM) - Intervertebral lumbar disc disorder with myelopathy, lumbar region  Z74.09 (ICD-10-CM) - Impaired functional mobility, balance, gait, and endurance  R29.898 (ICD-10-CM) - Bilateral leg weakness  Z98.890 (ICD-10-CM) - S/P lumbar microdiscectomy    Rationale for Evaluation and Treatment: Rehabilitation  THERAPY DIAG:  Unsteadiness on feet  Difficulty in walking, not elsewhere  classified  Other lack of coordination  Muscle weakness (generalized)  ONSET DATE: 03/2023  SUBJECTIVE:  SUBJECTIVE STATEMENT:  Pt reports he ambulated to his mailbox without his cane. Pt reports no aches/pain.    PERTINENT HISTORY:  Approximately 6 weeks status post Right L2-L3 Laminectomy and microdiscectomy (04/05/23).  He states that he has graduated from his wheelchair and is almost only using his walker, trying to graduate to his cane.  Patient has a past medical history of arthritis, congestive heart failure, CID, CDD, enlarged prostate, HTN, HLD, left inguinal hernia, type 2 diabetes. 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    PAIN:  Are you having pain? No     PRECAUTIONS: Back   He can increase his weight to 25 pounds  RED FLAGS: None   WEIGHT BEARING RESTRICTIONS:  No lifting more than 25 pounds   FALLS:  Has patient fallen in last 6 months? No  LIVING ENVIRONMENT: Lives with: lives alone Lives in: House/apartment Stairs: Yes: External: 3 steps; on left going up Has following equipment at home: Single point cane and Walker - 2 wheeled, grab bars in shower   OCCUPATION: Engineer, drilling   PLOF: Independent  PATIENT GOALS: "Get back to walking without a walker or cane"   NEXT MD VISIT: 06/08/23 : prostate procedure   OBJECTIVE:  Note: Objective measures were completed at Evaluation unless otherwise noted.  DIAGNOSTIC FINDINGS:  IMPRESSION from MRI 03/26/23 1. Severe spinal canal narrowing at L2-L3 secondary to a combination of ligamentum flavum hypertrophy, short pedicles, and a disc bulge. 2. Severe left and moderate right neural foraminal narrowing at L5-S1.   PATIENT SURVEYS:   FOTO 05/29/23: 38  Typical result : 51   COGNITION: Overall cognitive status: Within functional limits for tasks assessed     SENSATION: Light touch: WFL   LOWER EXTREMITY MMT:    MMT Right eval Left eval  Hip flexion 4-/5 4-/5  Hip extension    Hip abduction 4 4  Hip adduction 4- 4  Hip internal rotation    Hip external rotation    Knee flexion 4-/5 4-/5  Knee extension 4+ 4+  Ankle dorsiflexion 4/5 4/5  Ankle plantarflexion 4-/5 4-/5  Ankle inversion    Ankle eversion     (Blank rows = not tested)    FUNCTIONAL TESTS:  5 times sit to stand: 16.59s intermittent hand use 10 meter walk test: 20.97  .52m/s with walker    TODAY'S TREATMENT: DATE: 08/20/23    NMR:  DGI - 18/24   Dynamic step tap without UE support onto 6" block- 10x, 2x15 reps alt LE's with 5# AW- Rates medium, mostly due to balance   Lateral step up/down then repeat back opp direction- x 10 reps with 5#AW each withUUE support. Rates medium. Slight difficulty in with foot clearance     Resistive gait- 5# x 296 feet with SPC- fatigue in LLE near end of second lap   Standing on 1 LE while performing hip swings (CW then CCW) with 5#AW  -1 set of 10 reps with each LE CW then 10 reps each LE CCW- min UE Support - Pt rates medium +   Forward step up/over 1/2 foam rolls x 4 with 5 #AW  in // bars without UE Support - down and back x 6 - CGA only, improvement from prior session  Retro gait with 5# AW in // bars - down and back x 4      THEREX:    -STS 1x12, 1x15 without UE support  Pt requires therapeutic rest breaks throughout  PATIENT EDUCATION:  Education details: HEP, POC, goals  Person educated: Patient Education method: Explanation, Demonstration, and Handouts Education comprehension: verbalized understanding and returned demonstration  HOME EXERCISE PROGRAM: Access Code: [email or web address] URL: [email or web address] Date: 05/29/2023 Prepared by: Precious Bard  Exercises - Standing March with Counter Support  - 1 x daily - 7 x weekly - 2 sets - 10 reps - 5 hold - Heel Raises with Counter Support  - 1 x daily - 7 x weekly - 2 sets - 10 reps - 5 hold - Sit to Stand Without Arm Support  - 1 x daily - 7 x weekly - 2 sets - 10 reps - 5 hold  ASSESSMENT:  CLINICAL IMPRESSION:  Pt scored 18/24 on DGI, indicating pt still at increased risk for falls. Pt did demo progression with interventions this visit, completing them with increased level of resistance and ambulating for longer distance before requiring a rest break.   Pt will continue to benefit from skilled therapy to address remaining deficits in order to improve overall QoL and return to PLOF.      OBJECTIVE IMPAIRMENTS: Abnormal gait, decreased activity tolerance, decreased balance, decreased endurance, decreased mobility, difficulty walking, decreased strength, impaired flexibility, and improper body mechanics.   ACTIVITY LIMITATIONS: carrying, lifting, bending, standing, squatting, stairs, transfers, bed mobility, dressing, and locomotion level  PARTICIPATION LIMITATIONS: meal prep, cleaning, laundry, driving, shopping, occupation, and yard work  PERSONAL FACTORS: arthritis, congestive heart failure, CID, CDD, enlarged prostate, HTN, HLD, left inguinal hernia, type 2 diabetes are also affecting patient's functional outcome.   REHAB POTENTIAL: Good  CLINICAL DECISION MAKING: Evolving/moderate complexity  EVALUATION COMPLEXITY: Moderate   GOALS: Goals reviewed with patient? Yes  SHORT TERM GOALS: Target date: 08/24/2023    Patient will demonstrates ability to ambulate for >229ft without AD with supervision assist to allow improved access to community  Goal status: initial    LONG TERM GOALS: Target date: 09/20/2022  Patient will increase 10 meter walk test to >1.52m/s as to improve gait speed for better community ambulation and to reduce fall risk. Baseline: .47 m/s;  07/09/2023= 0.58 m/s with and without cane 07/26/23: 0.64m/s with [facility]  08/16/23: 0.74 m/s without SPC; 0.92 m/s with SPC Goal status: PROGRESSING  2.  Patient will increase FOTO score to equal to or greater than 51 to demonstrate statistically significant improvement in mobility and quality of life. Baseline: 05/29/23: 38 07/09/2023= 49 07/26/23:47 08/16/23: 47 Goal status: PROGRESSING  3.  Patient (> 25 years old) will complete five times sit to stand test in < 15 seconds indicating an increased LE strength and improved balance. Baseline: 16.59 Intermittent hand use; 07/09/2023= 18.45 sec without UE support  07/26/23: 16.32 without UE Support  08/16/23: 11.70 without UE support Goal status: MET  4. Patient will ambulate 150 ft without the use of an assistive device in order to increase independence and improve quality of life.  Baseline: Not tested  07/26/23: able to ambulate >122ft with no AD and CGA only. Mild staggering R but no LOB  08/16/23: Pt able to ambulate 300' with no AD and CGA only.   goal status: PROGRESSING  5.  Patient will increase Berg Balance score by > 6 points to demonstrate decreased fall risk during functional activities. Baseline: awaiting testing until precautions are lifted; 07/10/2023= 41/56 07/26/23: 43/56 08/16/23: 48/56 Goal status: PROGRESSING  6. Pt will improved DGI without AD by 4 points to indicate increased safety and reduced  fall risk and improved community access.  Baseline: to be completed 08/20/23: 18/24  Status : NEW   PLAN:  PT FREQUENCY: 1-2x/week  PT DURATION: 8 weeks  PLANNED INTERVENTIONS: Therapeutic exercises, Therapeutic activity, Neuromuscular re-education, Balance training, Gait training, Patient/Family education, Self Care, Joint mobilization, Stair training, Vestibular training, Canalith repositioning, DME instructions, Dry Needling, Electrical stimulation, Cryotherapy, Moist heat, scar mobilization, Taping, Manual therapy,  and Re-evaluation.  PLAN FOR NEXT SESSION:  Coordination, balance and BLE strengthening.  Functional gait training.  balance   Temple Pacini PT, DPT  Physical Therapist - [facility]  08/20/23, 4:10 PM

## 2023-08-23 ENCOUNTER — Ambulatory Visit: Payer: 59 | Admitting: Urology

## 2023-08-27 ENCOUNTER — Ambulatory Visit (INDEPENDENT_AMBULATORY_CARE_PROVIDER_SITE_OTHER): Payer: 59 | Admitting: Neurosurgery

## 2023-08-27 ENCOUNTER — Encounter: Payer: Self-pay | Admitting: Neurosurgery

## 2023-08-27 VITALS — BP 132/74 | Ht 72.0 in | Wt 206.0 lb

## 2023-08-27 DIAGNOSIS — Z09 Encounter for follow-up examination after completed treatment for conditions other than malignant neoplasm: Secondary | ICD-10-CM

## 2023-08-27 DIAGNOSIS — R29898 Other symptoms and signs involving the musculoskeletal system: Secondary | ICD-10-CM

## 2023-08-27 DIAGNOSIS — M5106 Intervertebral disc disorders with myelopathy, lumbar region: Secondary | ICD-10-CM | POA: Diagnosis not present

## 2023-08-27 DIAGNOSIS — Z9889 Other specified postprocedural states: Secondary | ICD-10-CM

## 2023-08-27 NOTE — Therapy (Signed)
 OUTPATIENT PHYSICAL THERAPY TREATMENT    Patient Name: Lawrence Holmes MRN: [id number] DOB:[birth year], 68 y.o., male Today's Date: 08/28/2023  END OF SESSION:  PT End of Session - 08/28/23 1255     Visit Number 22    Number of Visits 31    Date for PT Re-Evaluation 09/21/23    Authorization Type [employer]    Progress Note Due on Visit 20    PT Start Time 1310    PT Stop Time 1355    PT Time Calculation (min) 45 min    Equipment Utilized During Treatment Gait belt    Activity Tolerance Patient tolerated treatment well    Behavior During Therapy WFL for tasks assessed/performed                Past Medical History:  Diagnosis Date   Aortic atherosclerosis (HCC)    Arthritis    BPH with urinary obstruction    Chronic systolic heart failure (HCC) 07/23/2017   a. TTE 07/23/2017: EF 30-35%, mild LVH, inferolateral and inferior AK, mild BAE, G1DD; b. TTE 10/23/2017: EF 40-45%, mild LVH, basal-mid inferior and inferoseptal AK, mild MR; c. TTE 11/19/2021: EF 45-50%, glob HK with mod to severe inferior and basal to mid septal HK, GLS -15.3%, mild MR, G1DD; d. 07/2023 Echo: EF 40-45%, inf/post HK, GrI DD, nl RV fxn.   Coronary artery disease 07/21/2017   a. 07/2017 Late presenting inferior STEMI/Cath: LM nl, LAD min irregs, LCX 100d (L->L collats), RCA 100p (L->R collats), EF 25-35%-->Med Rx; b. 10/2017 ETT (DOT): Ex time 7:38, baseline antlat TWI, no acute changes; c. 09/2019 ETT: Ex time 6:49. No ECG changes.   DDD (degenerative disc disease), lumbar    a.) s/p MIS L2-3 laminectomy and disectomy   Diverticulosis of colon    Erectile dysfunction    HTN (hypertension)    Hx of bilateral cataract extraction    Hyperlipidemia    Ischemic cardiomyopathy    a. LHC 07/21/2017: EF 25-35%; b. TTE 07/23/2017: 30-35%; c. TTE 10/23/2017: EF 40-45%; d. TTE 11/19/2021: EF 45-50%; e. 07/2023 Echo: EF 40-45%.   Left inguinal hernia    a.) s/p repair 05/19/2022   PVC's (premature ventricular  contractions)    a. 06/2023 72 hr Zio: Predominantly sinus rhythm at an average rate of 81 (57-1 33).  1 7 beat run of SVT at 133.  1.3% PAC burden.  14% PVC burden.   ST elevation myocardial infarction (STEMI) of inferior wall (HCC) 07/21/2017   a. LHC 07/21/2017: EF 25-35%, LVEDP 14 mmHg. 40% pLCx, 40% mLCx, 100% dLCx, 100% pRCA --> attempted to cross wire at RCA lesion, however unable. Reasonable RCA collaterals already formed --> med mgmt.   T2DM (type 2 diabetes mellitus) (HCC)    Tobacco use    Past Surgical History:  Procedure Laterality Date   CATARACT EXTRACTION Bilateral    COLONOSCOPY WITH PROPOFOL  N/A 04/04/2019   Procedure: COLONOSCOPY WITH PROPOFOL ;  Surgeon: Janalyn Keene NOVAK, MD;  Location: [facility];  Service: Endoscopy;  Laterality: N/A;   CYSTOSCOPY WITH FULGERATION N/A 05/14/2023   Procedure: CYSTOSCOPY WITH CLOT EVACUATION AND FULGERATION;  Surgeon: Penne Knee, MD;  Location: [facility];  Service: Urology;  Laterality: N/A;   HOLEP-LASER ENUCLEATION OF THE PROSTATE WITH MORCELLATION N/A 06/08/2023   Procedure: HOLEP-LASER ENUCLEATION OF THE PROSTATE WITH MORCELLATION;  Surgeon: Francisca Redell BROCKS, MD;  Location: [facility];  Service: Urology;  Laterality: N/A;   LEFT HEART CATH AND CORONARY ANGIOGRAPHY N/A 07/21/2017  Procedure: LEFT HEART CATH AND CORONARY ANGIOGRAPHY;  Surgeon: Darron Deatrice LABOR, MD;  Location: [facility];  Service: Cardiovascular;  Laterality: N/A;   LUMBAR LAMINECTOMY/DECOMPRESSION MICRODISCECTOMY Bilateral 04/05/2023   Procedure: MINIMALLY INVASIVE (MIS) L2-3 LAMINECTOMY AND DISCECTOMY;  Surgeon: Claudene Penne Lin, MD;  Location: [facility];  Service: Neurosurgery;  Laterality: Bilateral;   XI ROBOTIC ASSISTED INGUINAL HERNIA REPAIR WITH MESH Left 05/19/2022   Procedure: XI ROBOTIC ASSISTED INGUINAL HERNIA REPAIR WITH MESH;  Surgeon: Lane Shope, MD;  Location: [facility];  Service: General;  Laterality: Left;   Patient Active  Problem List   Diagnosis Date Noted   S/P lumbar microdiscectomy 05/16/2023   Hematuria 05/12/2023   Acute urinary retention 05/12/2023   Bilateral hydronephrosis 05/12/2023   HTN (hypertension) 05/12/2023   HLD (hyperlipidemia) 05/12/2023   Type II diabetes mellitus with renal manifestations (HCC) 05/12/2023   Chronic kidney disease, stage 3a (HCC) 05/12/2023   Chronic systolic CHF (congestive heart failure) (HCC) 05/12/2023   BPH (benign prostatic hyperplasia) 05/12/2023   UTI (urinary tract infection) 05/12/2023   CAD (coronary artery disease) 05/12/2023   Intervertebral lumbar disc disorder with myelopathy, lumbar region 04/02/2023   Impaired functional mobility, balance, gait, and endurance 03/26/2023   Bilateral leg weakness 03/26/2023   Abnormal prostate exam 05/09/2022   Aortic atherosclerosis (HCC) 02/24/2022   Smokes with greater than 30 pack year history 11/18/2021   Coronary artery disease of native artery of native heart with stable angina pectoris (HCC) 05/19/2021   Dyslipidemia 07/30/2020   Polyp of colon    Type 2 diabetes mellitus, controlled (HCC) 02/08/2018   History of myocardial infarction 11/08/2017   Tobacco use 11/08/2017   Ischemic cardiomyopathy 11/08/2017   CHF (congestive heart failure) (HCC) 11/08/2017   Foot callus 11/08/2017   Onychomycosis of multiple toenails with type 2 diabetes mellitus (HCC) 11/08/2017   Cataract, nuclear sclerotic, left eye 04/04/2016    PCP: Michelene Cower, PA-C  REFERRING PROVIDER: Rocky BRAVO Mecum, PA-C  REFERRING DIAG:  M51.06 (ICD-10-CM) - Intervertebral lumbar disc disorder with myelopathy, lumbar region  Z74.09 (ICD-10-CM) - Impaired functional mobility, balance, gait, and endurance  R29.898 (ICD-10-CM) - Bilateral leg weakness  Z98.890 (ICD-10-CM) - S/P lumbar microdiscectomy    Rationale for Evaluation and Treatment: Rehabilitation  THERAPY DIAG:  Unsteadiness on feet  Difficulty in walking, not elsewhere  classified  Other lack of coordination  Muscle weakness (generalized)  ONSET DATE: 03/2023  SUBJECTIVE:  SUBJECTIVE STATEMENT:   Patient saw physician since last appt. 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 SABRA    PERTINENT HISTORY:  Approximately 6 weeks status post Right L2-L3 Laminectomy and microdiscectomy (04/05/23).  He states that he has graduated from his wheelchair and is almost only using his walker, trying to graduate to his cane.  Patient has a past medical history of arthritis, congestive heart failure, CID, CDD, enlarged prostate, HTN, HLD, left inguinal hernia, type 2 diabetes. 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    PAIN:  Are you having pain? No     PRECAUTIONS: Back   He can increase his weight to 25 pounds  RED FLAGS: None   WEIGHT BEARING RESTRICTIONS:  No lifting more than 25 pounds   FALLS:  Has patient fallen in last 6 months? No  LIVING ENVIRONMENT: Lives with: lives alone Lives in: House/apartment Stairs: Yes: External: 3 steps; on left going up Has following equipment at home: Single point cane and Walker - 2 wheeled, grab bars in shower   OCCUPATION: Engineer, drilling   PLOF: Independent  PATIENT GOALS: Get back to walking without a walker or cane   NEXT MD VISIT: 06/08/23 : prostate procedure   OBJECTIVE:  Note: Objective measures were completed at Evaluation unless otherwise noted.  DIAGNOSTIC FINDINGS:  IMPRESSION from MRI 03/26/23 1. Severe spinal canal narrowing at L2-L3 secondary to a combination of ligamentum flavum hypertrophy, short pedicles, and a disc bulge. 2. Severe left and moderate right neural foraminal narrowing at L5-S1.    PATIENT SURVEYS:  FOTO 05/29/23: 38  Typical result : 51   COGNITION: Overall cognitive status: Within functional limits for tasks assessed     SENSATION: Light touch: WFL   LOWER EXTREMITY MMT:    MMT Right eval Left eval  Hip flexion 4-/5 4-/5  Hip extension    Hip abduction 4 4  Hip adduction 4- 4  Hip internal rotation    Hip external rotation    Knee flexion 4-/5 4-/5  Knee extension 4+ 4+  Ankle dorsiflexion 4/5 4/5  Ankle plantarflexion 4-/5 4-/5  Ankle inversion    Ankle eversion     (Blank rows = not tested)    FUNCTIONAL TESTS:  5 times sit to stand: 16.59s intermittent hand use 10 meter walk test: 20.97  .36m/s with walker    TODAY'S TREATMENT: DATE: 08/28/23    NMR:  18 step up; 10x each side ; UE support   Dynamic step tap without UE support onto 6 block- 10x, 2x15 reps alt LE's with 5# AW- Rates medium, mostly due to balance   Lateral step up/down then repeat back opp direction- x 10 reps with 5#AW each withUUE support. Rates medium. Slight difficulty in with foot clearance  x2 sets   Resistive gait- 5# x 296 feet with SPC- fatigue in LLE near end of second lap   Standing with CGA next to support surface:  Airex pad: static stand 30 seconds x 2 trials, noticeable trembling of ankles/LE's with fatigue and challenge to maintain stability Airex pad: horizontal head turns 30 seconds scanning room 10x ; cueing for arc of motion  Airex pad: vertical head turns 30 seconds, cueing for arc of motion, noticeable sway with upward gaze increasing demand on ankle righting reaction musculature Airex pad: one foot on 6 step one foot on airex pad, hold position for 30 seconds,  switch legs, 2x each LE;     Activity Description: red=right foot green=left foot , standing Activity Setting:  random Number of Pods:  6 Cycles/Sets:  3 Duration (Time or Hit Count):  20 hits   THEREX:    -STS 1x12, 1x15 without UE support   Lateral step over hedgehog  15x each side    Pt requires therapeutic rest breaks throughout  PATIENT EDUCATION:  Education details: HEP, POC, goals  Person educated: Patient Education method: Explanation, Demonstration, and Handouts Education comprehension: verbalized understanding and returned demonstration  HOME EXERCISE PROGRAM: Access Code: [email or web address] URL: [email or web address] Date: 05/29/2023 Prepared by: Britney Newstrom  Exercises - Standing March with Counter Support  - 1 x daily - 7 x weekly - 2 sets - 10 reps - 5 hold - Heel Raises with Counter Support  - 1 x daily - 7 x weekly - 2 sets - 10 reps - 5 hold - Sit to Stand Without Arm Support  - 1 x daily - 7 x weekly - 2 sets - 10 reps - 5 hold  ASSESSMENT:  CLINICAL IMPRESSION:   Patient is highly motivated throughout session. Introduction to larger step tolerated well. He does require use of UE support for step negotiation. Patient's LLE is challenging compared to RLE for coordination and recruitment.  Pt will continue to benefit from skilled therapy to address remaining deficits in order to improve overall QoL and return to PLOF.      OBJECTIVE IMPAIRMENTS: Abnormal gait, decreased activity tolerance, decreased balance, decreased endurance, decreased mobility, difficulty walking, decreased strength, impaired flexibility, and improper body mechanics.   ACTIVITY LIMITATIONS: carrying, lifting, bending, standing, squatting, stairs, transfers, bed mobility, dressing, and locomotion level  PARTICIPATION LIMITATIONS: meal prep, cleaning, laundry, driving, shopping, occupation, and yard work  PERSONAL FACTORS: arthritis, congestive heart failure, CID, CDD, enlarged prostate, HTN, HLD, left inguinal hernia, type 2 diabetes are also affecting patient's functional outcome.   REHAB POTENTIAL: Good  CLINICAL DECISION MAKING: Evolving/moderate complexity  EVALUATION COMPLEXITY: Moderate   GOALS: Goals reviewed with patient? Yes  SHORT TERM  GOALS: Target date: 08/24/2023    Patient will demonstrates ability to ambulate for >233ft without AD with supervision assist to allow improved access to community  Goal status: initial    LONG TERM GOALS: Target date: 09/20/2022  Patient will increase 10 meter walk test to >1.71m/s as to improve gait speed for better community ambulation and to reduce fall risk. Baseline: .47 m/s; 07/09/2023= 0.58 m/s with and without cane 07/26/23: 0.69m/s with [facility]  08/16/23: 0.74 m/s without SPC; 0.92 m/s with SPC Goal status: PROGRESSING  2.  Patient will increase FOTO score to equal to or greater than 51 to demonstrate statistically significant improvement in mobility and quality of life. Baseline: 05/29/23: 38 07/09/2023= 49 07/26/23:47 08/16/23: 47 Goal status: PROGRESSING  3.  Patient (> 28 years old) will complete five times sit to stand test in < 15 seconds indicating an increased LE strength and improved balance. Baseline: 16.59 Intermittent hand use; 07/09/2023= 18.45 sec without UE support  07/26/23: 16.32 without UE Support  08/16/23: 11.70 without UE support Goal status: MET  4. Patient will ambulate 150 ft without the use of an assistive device in order to increase independence and improve quality of life.  Baseline: Not tested  07/26/23: able to ambulate >120ft with no AD and CGA only. Mild staggering R but no LOB  08/16/23: Pt able to ambulate 300' with no AD and CGA only.   goal  status: PROGRESSING  5.  Patient will increase Berg Balance score by > 6 points to demonstrate decreased fall risk during functional activities. Baseline: awaiting testing until precautions are lifted; 07/10/2023= 41/56 07/26/23: 43/56 08/16/23: 48/56 Goal status: PROGRESSING  6. Pt will improved DGI without AD by 4 points to indicate increased safety and reduced fall risk and improved community access.  Baseline: to be completed 08/20/23: 18/24  Status : NEW   PLAN:  PT FREQUENCY: 1-2x/week  PT  DURATION: 8 weeks  PLANNED INTERVENTIONS: Therapeutic exercises, Therapeutic activity, Neuromuscular re-education, Balance training, Gait training, Patient/Family education, Self Care, Joint mobilization, Stair training, Vestibular training, Canalith repositioning, DME instructions, Dry Needling, Electrical stimulation, Cryotherapy, Moist heat, scar mobilization, Taping, Manual therapy, and Re-evaluation.  PLAN FOR NEXT SESSION:  Coordination, balance and BLE strengthening.  Functional gait training.  balance   Kamya Watling  Leopoldo PT   Physical Therapist - [facility]  08/28/23, 1:57 PM

## 2023-08-27 NOTE — Progress Notes (Signed)
   REFERRING PHYSICIAN:  Leavy Mole, Pa-c [id number] [place],  [place] [address]  DOS: 04/05/23  Right L2-L3 Laminectomy and microdiscectomy   HISTORY OF PRESENT ILLNESS: Overall he is been doing very well.  He states that he has been satisfied with his outcome.  He noticed an significant improvement in his lower extremity strength.  He states that he will even leave his cane at home when he is walking but for shorter periods.  His pain has improved significantly.  He does feel like he gets tightness in his back every once in a while.  He is working hard with physical therapy.  PHYSICAL EXAMINATION:  General: Patient is well developed, well nourished, calm, collected, and in no apparent distress.   NEUROLOGICAL:  General: In no acute distress.   Awake, alert, oriented to person, place, and time.  Pupils equal round and reactive to light.  Facial tone is symmetric.     Strength:           Side Iliopsoas Quads Hamstring PF DF EHL  R 5 5 5 5  4+ 4+  L 5 5 5  4+ 4+ 4+   Incision well healed   ROS (Neurologic):  Negative except as noted above  IMAGING: Nothing new to review.   ASSESSMENT/PLAN:  Lawrence Holmes is doing well s/p above surgery. He is progressing well with PT. Treatment options reviewed with patient and following plan made:   -He continues to work with physical therapy, he has had a significant amount of improvement, feels that he may be discharged from physical therapy soon. -Continues to have good improvement, may be able to return to work soon.  Advised to contact the office if any questions or concerns arise.  Lawrence MICAEL Sharps, MD Department of neurosurgery

## 2023-08-28 ENCOUNTER — Ambulatory Visit: Payer: 59 | Attending: Physician Assistant

## 2023-08-28 DIAGNOSIS — R278 Other lack of coordination: Secondary | ICD-10-CM | POA: Diagnosis present

## 2023-08-28 DIAGNOSIS — R262 Difficulty in walking, not elsewhere classified: Secondary | ICD-10-CM | POA: Diagnosis present

## 2023-08-28 DIAGNOSIS — R2681 Unsteadiness on feet: Secondary | ICD-10-CM

## 2023-08-28 DIAGNOSIS — M6281 Muscle weakness (generalized): Secondary | ICD-10-CM

## 2023-08-30 ENCOUNTER — Ambulatory Visit: Payer: 59

## 2023-09-03 ENCOUNTER — Ambulatory Visit: Payer: 59 | Admitting: Physical Therapy

## 2023-09-03 DIAGNOSIS — R2681 Unsteadiness on feet: Secondary | ICD-10-CM

## 2023-09-03 DIAGNOSIS — R262 Difficulty in walking, not elsewhere classified: Secondary | ICD-10-CM

## 2023-09-03 DIAGNOSIS — M6281 Muscle weakness (generalized): Secondary | ICD-10-CM

## 2023-09-03 DIAGNOSIS — R278 Other lack of coordination: Secondary | ICD-10-CM

## 2023-09-03 NOTE — Therapy (Signed)
 OUTPATIENT PHYSICAL THERAPY TREATMENT    Patient Name: Lawrence Holmes MRN: [id number] DOB:02/01/1956, 68 y.o., male Today's Date: 09/03/2023  END OF SESSION:  PT End of Session - 09/03/23 1359     Visit Number 23    Number of Visits 31    Date for PT Re-Evaluation 09/21/23    Authorization Type [employer]    Progress Note Due on Visit 20    PT Start Time 1402    PT Stop Time 1445    PT Time Calculation (min) 43 min    Equipment Utilized During Treatment Gait belt    Activity Tolerance Patient tolerated treatment well    Behavior During Therapy WFL for tasks assessed/performed                Past Medical History:  Diagnosis Date   Aortic atherosclerosis (HCC)    Arthritis    BPH with urinary obstruction    Chronic systolic heart failure (HCC) 07/23/2017   a. TTE 07/23/2017: EF 30-35%, mild LVH, inferolateral and inferior AK, mild BAE, G1DD; b. TTE 10/23/2017: EF 40-45%, mild LVH, basal-mid inferior and inferoseptal AK, mild MR; c. TTE 11/19/2021: EF 45-50%, glob HK with mod to severe inferior and basal to mid septal HK, GLS -15.3%, mild MR, G1DD; d. 07/2023 Echo: EF 40-45%, inf/post HK, GrI DD, nl RV fxn.   Coronary artery disease 07/21/2017   a. 07/2017 Late presenting inferior STEMI/Cath: LM nl, LAD min irregs, LCX 100d (L->L collats), RCA 100p (L->R collats), EF 25-35%-->Med Rx; b. 10/2017 ETT (DOT): Ex time 7:38, baseline antlat TWI, no acute changes; c. 09/2019 ETT: Ex time 6:49. No ECG changes.   DDD (degenerative disc disease), lumbar    a.) s/p MIS L2-3 laminectomy and disectomy   Diverticulosis of colon    Erectile dysfunction    HTN (hypertension)    Hx of bilateral cataract extraction    Hyperlipidemia    Ischemic cardiomyopathy    a. LHC 07/21/2017: EF 25-35%; b. TTE 07/23/2017: 30-35%; c. TTE 10/23/2017: EF 40-45%; d. TTE 11/19/2021: EF 45-50%; e. 07/2023 Echo: EF 40-45%.   Left inguinal hernia    a.) s/p repair 05/19/2022   PVC's (premature ventricular  contractions)    a. 06/2023 72 hr Zio: Predominantly sinus rhythm at an average rate of 81 (57-1 33).  1 7 beat run of SVT at 133.  1.3% PAC burden.  14% PVC burden.   ST elevation myocardial infarction (STEMI) of inferior wall (HCC) 07/21/2017   a. LHC 07/21/2017: EF 25-35%, LVEDP 14 mmHg. 40% pLCx, 40% mLCx, 100% dLCx, 100% pRCA --> attempted to cross wire at RCA lesion, however unable. Reasonable RCA collaterals already formed --> med mgmt.   T2DM (type 2 diabetes mellitus) (HCC)    Tobacco use    Past Surgical History:  Procedure Laterality Date   CATARACT EXTRACTION Bilateral    COLONOSCOPY WITH PROPOFOL  N/A 04/04/2019   Procedure: COLONOSCOPY WITH PROPOFOL ;  Surgeon: Janalyn Keene NOVAK, MD;  Location: [facility];  Service: Endoscopy;  Laterality: N/A;   CYSTOSCOPY WITH FULGERATION N/A 05/14/2023   Procedure: CYSTOSCOPY WITH CLOT EVACUATION AND FULGERATION;  Surgeon: Penne Knee, MD;  Location: [facility];  Service: Urology;  Laterality: N/A;   HOLEP-LASER ENUCLEATION OF THE PROSTATE WITH MORCELLATION N/A 06/08/2023   Procedure: HOLEP-LASER ENUCLEATION OF THE PROSTATE WITH MORCELLATION;  Surgeon: Francisca Redell BROCKS, MD;  Location: [facility];  Service: Urology;  Laterality: N/A;   LEFT HEART CATH AND CORONARY ANGIOGRAPHY N/A 07/21/2017  Procedure: LEFT HEART CATH AND CORONARY ANGIOGRAPHY;  Surgeon: Darron Deatrice LABOR, MD;  Location: [facility];  Service: Cardiovascular;  Laterality: N/A;   LUMBAR LAMINECTOMY/DECOMPRESSION MICRODISCECTOMY Bilateral 04/05/2023   Procedure: MINIMALLY INVASIVE (MIS) L2-3 LAMINECTOMY AND DISCECTOMY;  Surgeon: Claudene Penne Lin, MD;  Location: [facility];  Service: Neurosurgery;  Laterality: Bilateral;   XI ROBOTIC ASSISTED INGUINAL HERNIA REPAIR WITH MESH Left 05/19/2022   Procedure: XI ROBOTIC ASSISTED INGUINAL HERNIA REPAIR WITH MESH;  Surgeon: Lane Shope, MD;  Location: [facility];  Service: General;  Laterality: Left;   Patient Active  Problem List   Diagnosis Date Noted   S/P lumbar microdiscectomy 05/16/2023   Hematuria 05/12/2023   Acute urinary retention 05/12/2023   Bilateral hydronephrosis 05/12/2023   HTN (hypertension) 05/12/2023   HLD (hyperlipidemia) 05/12/2023   Type II diabetes mellitus with renal manifestations (HCC) 05/12/2023   Chronic kidney disease, stage 3a (HCC) 05/12/2023   Chronic systolic CHF (congestive heart failure) (HCC) 05/12/2023   BPH (benign prostatic hyperplasia) 05/12/2023   UTI (urinary tract infection) 05/12/2023   CAD (coronary artery disease) 05/12/2023   Intervertebral lumbar disc disorder with myelopathy, lumbar region 04/02/2023   Impaired functional mobility, balance, gait, and endurance 03/26/2023   Bilateral leg weakness 03/26/2023   Abnormal prostate exam 05/09/2022   Aortic atherosclerosis (HCC) 02/24/2022   Smokes with greater than 30 pack year history 11/18/2021   Coronary artery disease of native artery of native heart with stable angina pectoris (HCC) 05/19/2021   Dyslipidemia 07/30/2020   Polyp of colon    Type 2 diabetes mellitus, controlled (HCC) 02/08/2018   History of myocardial infarction 11/08/2017   Tobacco use 11/08/2017   Ischemic cardiomyopathy 11/08/2017   CHF (congestive heart failure) (HCC) 11/08/2017   Foot callus 11/08/2017   Onychomycosis of multiple toenails with type 2 diabetes mellitus (HCC) 11/08/2017   Cataract, nuclear sclerotic, left eye 04/04/2016    PCP: Michelene Cower, PA-C  REFERRING PROVIDER: Rocky BRAVO Mecum, PA-C  REFERRING DIAG:  M51.06 (ICD-10-CM) - Intervertebral lumbar disc disorder with myelopathy, lumbar region  Z74.09 (ICD-10-CM) - Impaired functional mobility, balance, gait, and endurance  R29.898 (ICD-10-CM) - Bilateral leg weakness  Z98.890 (ICD-10-CM) - S/P lumbar microdiscectomy    Rationale for Evaluation and Treatment: Rehabilitation  THERAPY DIAG:  Unsteadiness on feet  Difficulty in walking, not elsewhere  classified  Other lack of coordination  Muscle weakness (generalized)  ONSET DATE: 03/2023  SUBJECTIVE:  SUBJECTIVE STATEMENT:  Pt reports a little stiffness today. No other updates.    PERTINENT HISTORY:  Approximately 6 weeks status post Right L2-L3 Laminectomy and microdiscectomy (04/05/23).  He states that he has graduated from his wheelchair and is almost only using his walker, trying to graduate to his cane.  Patient has a past medical history of arthritis, congestive heart failure, CID, CDD, enlarged prostate, HTN, HLD, left inguinal hernia, type 2 diabetes. 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    PAIN:  Are you having pain? No     PRECAUTIONS: Back   He can increase his weight to 25 pounds  RED FLAGS: None   WEIGHT BEARING RESTRICTIONS:  No lifting more than 25 pounds   FALLS:  Has patient fallen in last 6 months? No  LIVING ENVIRONMENT: Lives with: lives alone Lives in: House/apartment Stairs: Yes: External: 3 steps; on left going up Has following equipment at home: Single point cane and Walker - 2 wheeled, grab bars in shower   OCCUPATION: Engineer, drilling   PLOF: Independent  PATIENT GOALS: Get back to walking without a walker or cane   NEXT MD VISIT: 06/08/23 : prostate procedure   OBJECTIVE:  Note: Objective measures were completed at Evaluation unless otherwise noted.  DIAGNOSTIC FINDINGS:  IMPRESSION from MRI 03/26/23 1. Severe spinal canal narrowing at L2-L3 secondary to a combination of ligamentum flavum hypertrophy, short pedicles, and a disc bulge. 2. Severe left and moderate right neural foraminal narrowing at L5-S1.   PATIENT SURVEYS:  FOTO 05/29/23: 38  Typical  result : 51   COGNITION: Overall cognitive status: Within functional limits for tasks assessed     SENSATION: Light touch: WFL   LOWER EXTREMITY MMT:    MMT Right eval Left eval  Hip flexion 4-/5 4-/5  Hip extension    Hip abduction 4 4  Hip adduction 4- 4  Hip internal rotation    Hip external rotation    Knee flexion 4-/5 4-/5  Knee extension 4+ 4+  Ankle dorsiflexion 4/5 4/5  Ankle plantarflexion 4-/5 4-/5  Ankle inversion    Ankle eversion     (Blank rows = not tested)    FUNCTIONAL TESTS:  5 times sit to stand: 16.59s intermittent hand use 10 meter walk test: 20.97  .70m/s with walker    TODAY'S TREATMENT: DATE: 09/03/23    NMR:   Nustep BLE/BUE reciprocal movement training. X 5.5 min level 2-4  cues for consistent spm through varied resistance.   Step up/back on 4 inch aerobic step x 10 bil  Lateral step up/across aerobic step x 8 bil   Foot tap on 1 of 2 random spikeballs on aerobic step 2 x 30 sec bil   Gait without UE support x 345ft and 249ft with CGA fading to min assist due to increased LLE ataxia and decreased strength on the L hip. Mild instability in turns with fatigue. Side stepping R and L 73ft x 4 bil CGA/min assist with increased ataxia with fatigue.   Throughout session PT provided CGA for safety with intermittent min assist with increased LLE ataxia and L hip weakness with fatigue.   Pt requires therapeutic rest breaks throughout  PATIENT EDUCATION:  Education details: HEP, POC, goals  Person educated: Patient Education method: Explanation, Demonstration, and Handouts Education comprehension: verbalized understanding and returned demonstration  HOME EXERCISE PROGRAM: Access Code: [email or web address] URL: [email or web address] Date: 05/29/2023 Prepared by: Suzzane Custard  Exercises - Standing March with Counter Support  - 1 x daily - 7 x weekly - 2 sets - 10 reps - 5 hold - Heel Raises with Counter Support  - 1 x daily - 7 x  weekly - 2 sets - 10 reps - 5 hold - Sit to Stand Without Arm Support  - 1 x daily - 7 x weekly - 2 sets - 10 reps - 5 hold  ASSESSMENT:  CLINICAL IMPRESSION:   Patient is highly motivated throughout session. Continued high level balance and gait training with reduced UE support. Tolerated well, but LLE ataxia increased with fatigue requiring standing rest breaks on longer bouts of gait training. Assist required with weight shift on Single limb tasks with stance on the LLE.  Pt will continue to benefit from skilled therapy to address remaining deficits in order to improve overall QoL and return to PLOF.      OBJECTIVE IMPAIRMENTS: Abnormal gait, decreased activity tolerance, decreased balance, decreased endurance, decreased mobility, difficulty walking, decreased strength, impaired flexibility, and improper body mechanics.   ACTIVITY LIMITATIONS: carrying, lifting, bending, standing, squatting, stairs, transfers, bed mobility, dressing, and locomotion level  PARTICIPATION LIMITATIONS: meal prep, cleaning, laundry, driving, shopping, occupation, and yard work  PERSONAL FACTORS: arthritis, congestive heart failure, CID, CDD, enlarged prostate, HTN, HLD, left inguinal hernia, type 2 diabetes are also affecting patient's functional outcome.   REHAB POTENTIAL: Good  CLINICAL DECISION MAKING: Evolving/moderate complexity  EVALUATION COMPLEXITY: Moderate   GOALS: Goals reviewed with patient? Yes  SHORT TERM GOALS: Target date: 08/24/2023    Patient will demonstrates ability to ambulate for >244ft without AD with supervision assist to allow improved access to community  Goal status: initial    LONG TERM GOALS: Target date: 09/20/2022  Patient will increase 10 meter walk test to >1.81m/s as to improve gait speed for better community ambulation and to reduce fall risk. Baseline: .47 m/s; 07/09/2023= 0.58 m/s with and without cane 07/26/23: 0.666m/s with [facility]  08/16/23: 0.74 m/s without SPC;  0.92 m/s with SPC Goal status: PROGRESSING  2.  Patient will increase FOTO score to equal to or greater than 51 to demonstrate statistically significant improvement in mobility and quality of life. Baseline: 05/29/23: 38 07/09/2023= 49 07/26/23:47 08/16/23: 47 Goal status: PROGRESSING  3.  Patient (> 56 years old) will complete five times sit to stand test in < 15 seconds indicating an increased LE strength and improved balance. Baseline: 16.59 Intermittent hand use; 07/09/2023= 18.45 sec without UE support  07/26/23: 16.32 without UE Support  08/16/23: 11.70 without UE support Goal status: MET  4. Patient will ambulate 150 ft without the use of an assistive device in order to increase independence and improve quality of life.  Baseline: Not tested  07/26/23: able to ambulate >173ft with no AD and CGA only. Mild staggering R but no LOB  08/16/23: Pt able to ambulate 300' with no AD and CGA only.   goal status: PROGRESSING  5.  Patient will increase Berg Balance score by > 6 points to demonstrate decreased fall risk during functional activities. Baseline: awaiting testing until precautions are lifted; 07/10/2023= 41/56 07/26/23: 43/56 08/16/23: 48/56 Goal status: PROGRESSING  6. Pt will improved DGI without AD by 4 points to indicate increased safety and reduced fall risk and improved community access.  Baseline: to be completed 08/20/23: 18/24  Status : NEW   PLAN:  PT FREQUENCY: 1-2x/week  PT DURATION: 8 weeks  PLANNED INTERVENTIONS: Therapeutic exercises, Therapeutic activity, Neuromuscular  re-education, Balance training, Gait training, Patient/Family education, Self Care, Joint mobilization, Stair training, Vestibular training, Canalith repositioning, DME instructions, Dry Needling, Electrical stimulation, Cryotherapy, Moist heat, scar mobilization, Taping, Manual therapy, and Re-evaluation.  PLAN FOR NEXT SESSION:  Coordination, balance and BLE strengthening.  Functional  gait training. High level balance   Massie FORBES Dollar PT   Physical Therapist - [facility]  09/03/23, 1:59 PM

## 2023-09-04 ENCOUNTER — Institutional Professional Consult (permissible substitution): Payer: 59 | Admitting: Cardiology

## 2023-09-04 ENCOUNTER — Ambulatory Visit: Payer: 59 | Admitting: Podiatry

## 2023-09-05 ENCOUNTER — Ambulatory Visit: Payer: 59 | Admitting: Physical Therapy

## 2023-09-05 DIAGNOSIS — R2681 Unsteadiness on feet: Secondary | ICD-10-CM

## 2023-09-05 DIAGNOSIS — M6281 Muscle weakness (generalized): Secondary | ICD-10-CM

## 2023-09-05 DIAGNOSIS — R262 Difficulty in walking, not elsewhere classified: Secondary | ICD-10-CM

## 2023-09-05 NOTE — Therapy (Signed)
 OUTPATIENT PHYSICAL THERAPY TREATMENT    Patient Name: Lawrence Holmes [place] MRN: [id number] DOB:[birth year], 68 y.o., male Today's Date: 09/05/2023  END OF SESSION:  PT End of Session - 09/05/23 1628     Visit Number 24    Number of Visits 31    Date for PT Re-Evaluation 09/21/23    Authorization Type [employer]    Progress Note Due on Visit 30    PT Start Time 1530    PT Stop Time 1613    PT Time Calculation (min) 43 min    Equipment Utilized During Treatment Gait belt    Activity Tolerance Patient tolerated treatment well    Behavior During Therapy WFL for tasks assessed/performed                 Past Medical History:  Diagnosis Date   Aortic atherosclerosis (HCC)    Arthritis    BPH with urinary obstruction    Chronic systolic heart failure (HCC) 07/23/2017   a. TTE 07/23/2017: EF 30-35%, mild LVH, inferolateral and inferior AK, mild BAE, G1DD; b. TTE 10/23/2017: EF 40-45%, mild LVH, basal-mid inferior and inferoseptal AK, mild MR; c. TTE 11/19/2021: EF 45-50%, glob HK with mod to severe inferior and basal to mid septal HK, GLS -15.3%, mild MR, G1DD; Holmes. 07/2023 Echo: EF 40-45%, inf/post HK, GrI DD, nl RV fxn.   Coronary artery disease 07/21/2017   a. 07/2017 Late presenting inferior STEMI/Cath: LM nl, LAD min irregs, LCX 100d (L->L collats), RCA 100p (L->R collats), EF 25-35%-->Med Rx; b. 10/2017 ETT (DOT): Ex time 7:38, baseline antlat TWI, no acute changes; c. 09/2019 ETT: Ex time 6:49. No ECG changes.   DDD (degenerative disc disease), lumbar    a.) s/p MIS L2-3 laminectomy and disectomy   Diverticulosis of colon    Erectile dysfunction    HTN (hypertension)    Hx of bilateral cataract extraction    Hyperlipidemia    Ischemic cardiomyopathy    a. LHC 07/21/2017: EF 25-35%; b. TTE 07/23/2017: 30-35%; c. TTE 10/23/2017: EF 40-45%; Holmes. TTE 11/19/2021: EF 45-50%; e. 07/2023 Echo: EF 40-45%.   Left inguinal hernia    a.) s/p repair 05/19/2022   PVC's (premature ventricular  contractions)    a. 06/2023 72 hr Zio: Predominantly sinus rhythm at an average rate of 81 (57-1 33).  1 7 beat run of SVT at 133.  1.3% PAC burden.  14% PVC burden.   ST elevation myocardial infarction (STEMI) of inferior wall (HCC) 07/21/2017   a. LHC 07/21/2017: EF 25-35%, LVEDP 14 mmHg. 40% pLCx, 40% mLCx, 100% dLCx, 100% pRCA --> attempted to cross wire at RCA lesion, however unable. Reasonable RCA collaterals already formed --> med mgmt.   T2DM (type 2 diabetes mellitus) (HCC)    Tobacco use    Past Surgical History:  Procedure Laterality Date   CATARACT EXTRACTION Bilateral    COLONOSCOPY WITH PROPOFOL  N/A 04/04/2019   Procedure: COLONOSCOPY WITH PROPOFOL ;  Surgeon: Irby Mannan, MD;  Location: [facility];  Service: Endoscopy;  Laterality: N/A;   CYSTOSCOPY WITH FULGERATION N/A 05/14/2023   Procedure: CYSTOSCOPY WITH CLOT EVACUATION AND FULGERATION;  Surgeon: Dustin Gimenez, MD;  Location: [facility];  Service: Urology;  Laterality: N/A;   HOLEP-LASER ENUCLEATION OF THE PROSTATE WITH MORCELLATION N/A 06/08/2023   Procedure: HOLEP-LASER ENUCLEATION OF THE PROSTATE WITH MORCELLATION;  Surgeon: Lawerence Pressman, MD;  Location: [facility];  Service: Urology;  Laterality: N/A;   LEFT HEART CATH AND CORONARY ANGIOGRAPHY N/A  07/21/2017   Procedure: LEFT HEART CATH AND CORONARY ANGIOGRAPHY;  Surgeon: Wenona Hamilton, MD;  Location: [facility];  Service: Cardiovascular;  Laterality: N/A;   LUMBAR LAMINECTOMY/DECOMPRESSION MICRODISCECTOMY Bilateral 04/05/2023   Procedure: MINIMALLY INVASIVE (MIS) L2-3 LAMINECTOMY AND DISCECTOMY;  Surgeon: Aurther Blue, MD;  Location: [facility];  Service: Neurosurgery;  Laterality: Bilateral;   XI ROBOTIC ASSISTED INGUINAL HERNIA REPAIR WITH MESH Left 05/19/2022   Procedure: XI ROBOTIC ASSISTED INGUINAL HERNIA REPAIR WITH MESH;  Surgeon: Flynn Hylan, MD;  Location: [facility];  Service: General;  Laterality: Left;   Patient Active  Problem List   Diagnosis Date Noted   S/P lumbar microdiscectomy 05/16/2023   Hematuria 05/12/2023   Acute urinary retention 05/12/2023   Bilateral hydronephrosis 05/12/2023   HTN (hypertension) 05/12/2023   HLD (hyperlipidemia) 05/12/2023   Type II diabetes mellitus with renal manifestations (HCC) 05/12/2023   Chronic kidney disease, stage 3a (HCC) 05/12/2023   Chronic systolic CHF (congestive heart failure) (HCC) 05/12/2023   BPH (benign prostatic hyperplasia) 05/12/2023   UTI (urinary tract infection) 05/12/2023   CAD (coronary artery disease) 05/12/2023   Intervertebral lumbar disc disorder with myelopathy, lumbar region 04/02/2023   Impaired functional mobility, balance, gait, and endurance 03/26/2023   Bilateral leg weakness 03/26/2023   Abnormal prostate exam 05/09/2022   Aortic atherosclerosis (HCC) 02/24/2022   Smokes with greater than 30 pack year history 11/18/2021   Coronary artery disease of native artery of native heart with stable angina pectoris (HCC) 05/19/2021   Dyslipidemia 07/30/2020   Polyp of colon    Type 2 diabetes mellitus, controlled (HCC) 02/08/2018   History of myocardial infarction 11/08/2017   Tobacco use 11/08/2017   Ischemic cardiomyopathy 11/08/2017   CHF (congestive heart failure) (HCC) 11/08/2017   Foot callus 11/08/2017   Onychomycosis of multiple toenails with type 2 diabetes mellitus (HCC) 11/08/2017   Cataract, nuclear sclerotic, left eye 04/04/2016    PCP: Adeline Hone, PA-C  REFERRING PROVIDER: Pearla Bottom Mecum, PA-C  REFERRING DIAG:  M51.06 (ICD-10-CM) - Intervertebral lumbar disc disorder with myelopathy, lumbar region  Z74.09 (ICD-10-CM) - Impaired functional mobility, balance, gait, and endurance  R29.898 (ICD-10-CM) - Bilateral leg weakness  Z98.890 (ICD-10-CM) - S/P lumbar microdiscectomy    Rationale for Evaluation and Treatment: Rehabilitation  THERAPY DIAG:  No diagnosis found.  ONSET DATE: 03/2023  SUBJECTIVE:                                                                                                                                                                                            SUBJECTIVE STATEMENT:  Pt reports doing well  today. Pt denies any recent falls/stumbles since prior session. Pt denies any updates to medications or medical appointment since prior session. Pt reports good compliance with HEP when time permits.     PERTINENT HISTORY:  Approximately 6 weeks status post Right L2-L3 Laminectomy and microdiscectomy (04/05/23).  He states that he has graduated from his wheelchair and is almost only using his walker, trying to graduate to his cane.  Patient has a past medical history of arthritis, congestive heart failure, CID, CDD, enlarged prostate, HTN, HLD, left inguinal hernia, type 2 diabetes. 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    PAIN:  Are you having pain? No     PRECAUTIONS: Back   He can increase his weight to 25 pounds  RED FLAGS: None   WEIGHT BEARING RESTRICTIONS:  No lifting more than 25 pounds   FALLS:  Has patient fallen in last 6 months? No  LIVING ENVIRONMENT: Lives with: lives alone Lives in: House/apartment Stairs: Yes: External: 3 steps; on left going up Has following equipment at home: Single point cane and Walker - 2 wheeled, grab bars in shower   OCCUPATION: Engineer, drilling   PLOF: Independent  PATIENT GOALS: "Get back to walking without a walker or cane"   NEXT MD VISIT: 06/08/23 : prostate procedure   OBJECTIVE:  Note: Objective measures were completed at Evaluation unless otherwise noted.  DIAGNOSTIC FINDINGS:  IMPRESSION from MRI 03/26/23 1. Severe spinal canal narrowing at L2-L3 secondary to a combination of ligamentum flavum hypertrophy, short pedicles, and a disc bulge. 2. Severe left and moderate right neural foraminal narrowing at L5-S1.    PATIENT SURVEYS:  FOTO 05/29/23: 38  Typical result : 51   COGNITION: Overall cognitive status: Within functional limits for tasks assessed     SENSATION: Light touch: WFL   LOWER EXTREMITY MMT:    MMT Right eval Left eval  Hip flexion 4-/5 4-/5  Hip extension    Hip abduction 4 4  Hip adduction 4- 4  Hip internal rotation    Hip external rotation    Knee flexion 4-/5 4-/5  Knee extension 4+ 4+  Ankle dorsiflexion 4/5 4/5  Ankle plantarflexion 4-/5 4-/5  Ankle inversion    Ankle eversion     (Blank rows = not tested)    FUNCTIONAL TESTS:  5 times sit to stand: 16.59s intermittent hand use 10 meter walk test: 20.97  .57m/s with walker    TODAY'S TREATMENT: DATE: 09/05/23  Unless otherwise stated, CGA was provided and gait belt donned in order to ensure pt safety  TE  Nustep BLE/BUE reciprocal movement training. X 6 min level 4  cues for consistent spm through varied resistance.   Reverse lunge 2 x 5 on ea LE   TA Step up/back on 4 inch aerobic step x 10 bil with walking on step to other side for each rep  Side step up step trainer and lateral step across and step down x 12 total  Gait without UE support x 4 x 175 ft with intermittent retro walking and sidestepping and stopping on command  -increased ataxic gait with fatigue, 1 LOB with 180 degree turn on command requiring mod A for recovery     Pt requires therapeutic rest breaks throughout  PATIENT EDUCATION:  Education details: HEP, POC, goals  Person educated: Patient Education method: Explanation, Demonstration, and Handouts Education comprehension: verbalized understanding and returned demonstration  HOME EXERCISE PROGRAM: Access  Code: [email or web address] URL: [email or web address] Date: 05/29/2023 Prepared by: Marina  Moser  Exercises - Standing March with Counter Support  - 1 x daily - 7 x weekly - 2 sets - 10 reps - 5 hold - Heel Raises with Counter Support  - 1 x daily - 7 x weekly  - 2 sets - 10 reps - 5 hold - Sit to Stand Without Arm Support  - 1 x daily - 7 x weekly - 2 sets - 10 reps - 5 hold  ASSESSMENT:  CLINICAL IMPRESSION:   Patient is highly motivated throughout session.  Continued with balance and ambulatory training and working to improve his mobility and lower extremity strength.  Patient reports this is the post he is off without an assistive device and all of his sessions and feels he is continuing to make good progress.  Patient still does have increased ataxic movements with fatigue that have difficulty with a significant loss of balance when performing a 180 degree turn without assistive device.  Will continue to challenge this in future sessions.  Pt will continue to benefit from skilled therapy to address remaining deficits in order to improve overall QoL and return to PLOF.      OBJECTIVE IMPAIRMENTS: Abnormal gait, decreased activity tolerance, decreased balance, decreased endurance, decreased mobility, difficulty walking, decreased strength, impaired flexibility, and improper body mechanics.   ACTIVITY LIMITATIONS: carrying, lifting, bending, standing, squatting, stairs, transfers, bed mobility, dressing, and locomotion level  PARTICIPATION LIMITATIONS: meal prep, cleaning, laundry, driving, shopping, occupation, and yard work  PERSONAL FACTORS: arthritis, congestive heart failure, CID, CDD, enlarged prostate, HTN, HLD, left inguinal hernia, type 2 diabetes are also affecting patient's functional outcome.   REHAB POTENTIAL: Good  CLINICAL DECISION MAKING: Evolving/moderate complexity  EVALUATION COMPLEXITY: Moderate   GOALS: Goals reviewed with patient? Yes  SHORT TERM GOALS: Target date: 08/24/2023    Patient will demonstrates ability to ambulate for >281ft without AD with supervision assist to allow improved access to community  Goal status: initial    LONG TERM GOALS: Target date: 09/20/2022  Patient will increase 10 meter walk test  to >1.46m/s as to improve gait speed for better community ambulation and to reduce fall risk. Baseline: .47 m/s; 07/09/2023= 0.58 m/s with and without cane 07/26/23: 0.689m/s with [facility]  08/16/23: 0.74 m/s without SPC; 0.92 m/s with SPC Goal status: PROGRESSING  2.  Patient will increase FOTO score to equal to or greater than 51 to demonstrate statistically significant improvement in mobility and quality of life. Baseline: 05/29/23: 38 07/09/2023= 49 07/26/23:47 08/16/23: 47 Goal status: PROGRESSING  3.  Patient (> 2 years old) will complete five times sit to stand test in < 15 seconds indicating an increased LE strength and improved balance. Baseline: 16.59 Intermittent hand use; 07/09/2023= 18.45 sec without UE support  07/26/23: 16.32 without UE Support  08/16/23: 11.70 without UE support Goal status: MET  4. Patient will ambulate 150 ft without the use of an assistive device in order to increase independence and improve quality of life.  Baseline: Not tested  07/26/23: able to ambulate >169ft with no AD and CGA only. Mild staggering R but no LOB  08/16/23: Pt able to ambulate 300' with no AD and CGA only.   goal status: PROGRESSING  5.  Patient will increase Berg Balance score by > 6 points to demonstrate decreased fall risk during functional activities. Baseline: awaiting testing until precautions are lifted; 07/10/2023= 41/56 07/26/23: 43/56 08/16/23: 48/56 Goal status: PROGRESSING  6. Pt will  improved DGI without AD by 4 points to indicate increased safety and reduced fall risk and improved community access.  Baseline: to be completed 08/20/23: 18/24  Status : NEW   PLAN:  PT FREQUENCY: 1-2x/week  PT DURATION: 8 weeks  PLANNED INTERVENTIONS: Therapeutic exercises, Therapeutic activity, Neuromuscular re-education, Balance training, Gait training, Patient/Family education, Self Care, Joint mobilization, Stair training, Vestibular training, Canalith repositioning, DME  instructions, Dry Needling, Electrical stimulation, Cryotherapy, Moist heat, scar mobilization, Taping, Manual therapy, and Re-evaluation.  PLAN FOR NEXT SESSION:  Coordination, balance and BLE strengthening.  Functional gait training. High level balance   Edwina Gram PT   Physical Therapist - [facility]  09/05/23, 4:29 PM

## 2023-09-06 NOTE — Therapy (Signed)
OUTPATIENT PHYSICAL THERAPY TREATMENT    Patient Name: Lawrence Holmes [place] MRN: [id number] DOB:1956-05-02, 68 y.o., male Today's Date: 09/10/2023  END OF SESSION:  PT End of Session - 09/10/23 1311     Visit Number 25    Number of Visits 31    Date for PT Re-Evaluation 09/21/23    Authorization Type [employer] Medicare    Progress Note Due on Visit 30    PT Start Time 1315    PT Stop Time 1359    PT Time Calculation (min) 44 min    Equipment Utilized During Treatment Gait belt    Activity Tolerance Patient tolerated treatment well    Behavior During Therapy WFL for tasks assessed/performed                  Past Medical History:  Diagnosis Date   Aortic atherosclerosis (HCC)    Arthritis    BPH with urinary obstruction    Chronic systolic heart failure (HCC) 07/23/2017   a. TTE 07/23/2017: EF 30-35%, mild LVH, inferolateral and inferior AK, mild BAE, G1DD; b. TTE 10/23/2017: EF 40-45%, mild LVH, basal-mid inferior and inferoseptal AK, mild MR; c. TTE 11/19/2021: EF 45-50%, glob HK with mod to severe inferior and basal to mid septal HK, GLS -15.3%, mild MR, G1DD; Holmes. 07/2023 Echo: EF 40-45%, inf/post HK, GrI DD, nl RV fxn.   Coronary artery disease 07/21/2017   a. 07/2017 Late presenting inferior STEMI/Cath: LM nl, LAD min irregs, LCX 100d (L->L collats), RCA 100p (L->R collats), EF 25-35%-->Med Rx; b. 10/2017 ETT (DOT): Ex time 7:38, baseline antlat TWI, no acute changes; c. 09/2019 ETT: Ex time 6:49. No ECG changes.   DDD (degenerative disc disease), lumbar    a.) s/p MIS L2-3 laminectomy and disectomy   Diverticulosis of colon    Erectile dysfunction    HTN (hypertension)    Hx of bilateral cataract extraction    Hyperlipidemia    Ischemic cardiomyopathy    a. LHC 07/21/2017: EF 25-35%; b. TTE 07/23/2017: 30-35%; c. TTE 10/23/2017: EF 40-45%; Holmes. TTE 11/19/2021: EF 45-50%; e. 07/2023 Echo: EF 40-45%.   Left inguinal hernia    a.) s/p repair 05/19/2022   PVC's (premature  ventricular contractions)    a. 06/2023 72 hr Zio: Predominantly sinus rhythm at an average rate of 81 (57-1 33).  1 7 beat run of SVT at 133.  1.3% PAC burden.  14% PVC burden.   ST elevation myocardial infarction (STEMI) of inferior wall (HCC) 07/21/2017   a. LHC 07/21/2017: EF 25-35%, LVEDP 14 mmHg. 40% pLCx, 40% mLCx, 100% dLCx, 100% pRCA --> attempted to cross wire at RCA lesion, however unable. Reasonable RCA collaterals already formed --> med mgmt.   T2DM (type 2 diabetes mellitus) (HCC)    Tobacco use    Past Surgical History:  Procedure Laterality Date   CATARACT EXTRACTION Bilateral    COLONOSCOPY WITH PROPOFOL N/A 04/04/2019   Procedure: COLONOSCOPY WITH PROPOFOL;  Surgeon: Pasty Spillers, MD;  Location: [facility];  Service: Endoscopy;  Laterality: N/A;   CYSTOSCOPY WITH FULGERATION N/A 05/14/2023   Procedure: CYSTOSCOPY WITH CLOT EVACUATION AND FULGERATION;  Surgeon: Vanna Scotland, MD;  Location: [facility];  Service: Urology;  Laterality: N/A;   HOLEP-LASER ENUCLEATION OF THE PROSTATE WITH MORCELLATION N/A 06/08/2023   Procedure: HOLEP-LASER ENUCLEATION OF THE PROSTATE WITH MORCELLATION;  Surgeon: Sondra Come, MD;  Location: [facility];  Service: Urology;  Laterality: N/A;   LEFT HEART CATH AND CORONARY ANGIOGRAPHY  N/A 07/21/2017   Procedure: LEFT HEART CATH AND CORONARY ANGIOGRAPHY;  Surgeon: Iran Ouch, MD;  Location: [facility];  Service: Cardiovascular;  Laterality: N/A;   LUMBAR LAMINECTOMY/DECOMPRESSION MICRODISCECTOMY Bilateral 04/05/2023   Procedure: MINIMALLY INVASIVE (MIS) L2-3 LAMINECTOMY AND DISCECTOMY;  Surgeon: Loreen Freud, MD;  Location: [facility];  Service: Neurosurgery;  Laterality: Bilateral;   XI ROBOTIC ASSISTED INGUINAL HERNIA REPAIR WITH MESH Left 05/19/2022   Procedure: XI ROBOTIC ASSISTED INGUINAL HERNIA REPAIR WITH MESH;  Surgeon: Campbell Lerner, MD;  Location: [facility];  Service: General;  Laterality: Left;   Patient  Active Problem List   Diagnosis Date Noted   S/P lumbar microdiscectomy 05/16/2023   Hematuria 05/12/2023   Acute urinary retention 05/12/2023   Bilateral hydronephrosis 05/12/2023   HTN (hypertension) 05/12/2023   HLD (hyperlipidemia) 05/12/2023   Type II diabetes mellitus with renal manifestations (HCC) 05/12/2023   Chronic kidney disease, stage 3a (HCC) 05/12/2023   Chronic systolic CHF (congestive heart failure) (HCC) 05/12/2023   BPH (benign prostatic hyperplasia) 05/12/2023   UTI (urinary tract infection) 05/12/2023   CAD (coronary artery disease) 05/12/2023   Intervertebral lumbar disc disorder with myelopathy, lumbar region 04/02/2023   Impaired functional mobility, balance, gait, and endurance 03/26/2023   Bilateral leg weakness 03/26/2023   Abnormal prostate exam 05/09/2022   Aortic atherosclerosis (HCC) 02/24/2022   Smokes with greater than 30 pack year history 11/18/2021   Coronary artery disease of native artery of native heart with stable angina pectoris (HCC) 05/19/2021   Dyslipidemia 07/30/2020   Polyp of colon    Type 2 diabetes mellitus, controlled (HCC) 02/08/2018   History of myocardial infarction 11/08/2017   Tobacco use 11/08/2017   Ischemic cardiomyopathy 11/08/2017   CHF (congestive heart failure) (HCC) 11/08/2017   Foot callus 11/08/2017   Onychomycosis of multiple toenails with type 2 diabetes mellitus (HCC) 11/08/2017   Cataract, nuclear sclerotic, left eye 04/04/2016    PCP: Danelle Berry, PA-C  REFERRING PROVIDER: Oswaldo Conroy Mecum, PA-C  REFERRING DIAG:  M51.06 (ICD-10-CM) - Intervertebral lumbar disc disorder with myelopathy, lumbar region  Z74.09 (ICD-10-CM) - Impaired functional mobility, balance, gait, and endurance  R29.898 (ICD-10-CM) - Bilateral leg weakness  Z98.890 (ICD-10-CM) - S/P lumbar microdiscectomy    Rationale for Evaluation and Treatment: Rehabilitation  THERAPY DIAG:  Unsteadiness on feet  Difficulty in walking, not elsewhere  classified  Muscle weakness (generalized)  ONSET DATE: 03/2023  SUBJECTIVE:  SUBJECTIVE STATEMENT:  Patient reports he wants to be able to go back to work without his cane if possible.     PERTINENT HISTORY:  Approximately 6 weeks status post Right L2-L3 Laminectomy and microdiscectomy (04/05/23).  He states that he has graduated from his wheelchair and is almost only using his walker, trying to graduate to his cane.  Patient has a past medical history of arthritis, congestive heart failure, CID, CDD, enlarged prostate, HTN, HLD, left inguinal hernia, type 2 diabetes. 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    PAIN:  Are you having pain? No     PRECAUTIONS: Back   He can increase his weight to 25 pounds  RED FLAGS: None   WEIGHT BEARING RESTRICTIONS:  No lifting more than 25 pounds   FALLS:  Has patient fallen in last 6 months? No  LIVING ENVIRONMENT: Lives with: lives alone Lives in: House/apartment Stairs: Yes: External: 3 steps; on left going up Has following equipment at home: Single point cane and Walker - 2 wheeled, grab bars in shower   OCCUPATION: Engineer, drilling   PLOF: Independent  PATIENT GOALS: "Get back to walking without a walker or cane"   NEXT MD VISIT: 06/08/23 : prostate procedure   OBJECTIVE:  Note: Objective measures were completed at Evaluation unless otherwise noted.  DIAGNOSTIC FINDINGS:  IMPRESSION from MRI 03/26/23 1. Severe spinal canal narrowing at L2-L3 secondary to a combination of ligamentum flavum hypertrophy, short pedicles, and a disc bulge. 2. Severe left and moderate right neural foraminal narrowing at L5-S1.   PATIENT SURVEYS:  FOTO 05/29/23: 38  Typical  result : 51   COGNITION: Overall cognitive status: Within functional limits for tasks assessed     SENSATION: Light touch: WFL   LOWER EXTREMITY MMT:    MMT Right eval Left eval  Hip flexion 4-/5 4-/5  Hip extension    Hip abduction 4 4  Hip adduction 4- 4  Hip internal rotation    Hip external rotation    Knee flexion 4-/5 4-/5  Knee extension 4+ 4+  Ankle dorsiflexion 4/5 4/5  Ankle plantarflexion 4-/5 4-/5  Ankle inversion    Ankle eversion     (Blank rows = not tested)    FUNCTIONAL TESTS:  5 times sit to stand: 16.59s intermittent hand use 10 meter walk test: 20.97  .3m/s with walker    TODAY'S TREATMENT: DATE: 09/10/23  Unless otherwise stated, CGA was provided and gait belt donned in order to ensure pt safety  TE Reverse lunge 10x each LE  GTB hamstring curl 15x   Leg press;  Single leg #40; 8x; 3 sets each LE; second and third set 10x   Neuro Re-ed: Half foam roller:  -static stand 60 seconds -df/pf 20x  Step over hurdle 15x each LE    TA  Tall step (12" ) toe taps 10 x each side- reports is a high medium Tall step (12") step up/down 10x each side  Step up/back on 4 inch aerobic step x 10 bil with walking on step to other side for each rep   Pt requires therapeutic rest breaks throughout  PATIENT EDUCATION:  Education details: HEP, POC, goals  Person educated: Patient Education method: Explanation, Demonstration, and Handouts Education comprehension: verbalized understanding and returned demonstration  HOME EXERCISE PROGRAM: Access Code: [email or web address] URL: [email or web address] Date: 05/29/2023 Prepared by: Precious Bard  Exercises - Standing March with Counter Support  - 1  x daily - 7 x weekly - 2 sets - 10 reps - 5 hold - Heel Raises with Counter Support  - 1 x daily - 7 x weekly - 2 sets - 10 reps - 5 hold - Sit to Stand Without Arm Support  - 1 x daily - 7 x weekly - 2 sets - 10 reps - 5  hold  ASSESSMENT:  CLINICAL IMPRESSION:  Patient tolerates progressive single leg stability and strengthening with decreased need for assistance. His goal of returning to work was partially addressed with step ups to mimic getting into truck. Patient is fatigued by end of session.  Pt will continue to benefit from skilled therapy to address remaining deficits in order to improve overall QoL and return to PLOF.      OBJECTIVE IMPAIRMENTS: Abnormal gait, decreased activity tolerance, decreased balance, decreased endurance, decreased mobility, difficulty walking, decreased strength, impaired flexibility, and improper body mechanics.   ACTIVITY LIMITATIONS: carrying, lifting, bending, standing, squatting, stairs, transfers, bed mobility, dressing, and locomotion level  PARTICIPATION LIMITATIONS: meal prep, cleaning, laundry, driving, shopping, occupation, and yard work  PERSONAL FACTORS: arthritis, congestive heart failure, CID, CDD, enlarged prostate, HTN, HLD, left inguinal hernia, type 2 diabetes are also affecting patient's functional outcome.   REHAB POTENTIAL: Good  CLINICAL DECISION MAKING: Evolving/moderate complexity  EVALUATION COMPLEXITY: Moderate   GOALS: Goals reviewed with patient? Yes  SHORT TERM GOALS: Target date: 08/24/2023    Patient will demonstrates ability to ambulate for >241ft without AD with supervision assist to allow improved access to community  Goal status: initial    LONG TERM GOALS: Target date: 09/20/2022  Patient will increase 10 meter walk test to >1.71m/s as to improve gait speed for better community ambulation and to reduce fall risk. Baseline: .47 m/s; 07/09/2023= 0.58 m/s with and without cane 07/26/23: 0.648m/s with [facility]  08/16/23: 0.74 m/s without SPC; 0.92 m/s with SPC Goal status: PROGRESSING  2.  Patient will increase FOTO score to equal to or greater than 51 to demonstrate statistically significant improvement in mobility and quality of  life. Baseline: 05/29/23: 38 07/09/2023= 49 07/26/23:47 08/16/23: 47 Goal status: PROGRESSING  3.  Patient (> 39 years old) will complete five times sit to stand test in < 15 seconds indicating an increased LE strength and improved balance. Baseline: 16.59 Intermittent hand use; 07/09/2023= 18.45 sec without UE support  07/26/23: 16.32 without UE Support  08/16/23: 11.70 without UE support Goal status: MET  4. Patient will ambulate 150 ft without the use of an assistive device in order to increase independence and improve quality of life.  Baseline: Not tested  07/26/23: able to ambulate >180ft with no AD and CGA only. Mild staggering R but no LOB  08/16/23: Pt able to ambulate 300' with no AD and CGA only.   goal status: PROGRESSING  5.  Patient will increase Berg Balance score by > 6 points to demonstrate decreased fall risk during functional activities. Baseline: awaiting testing until precautions are lifted; 07/10/2023= 41/56 07/26/23: 43/56 08/16/23: 48/56 Goal status: PROGRESSING  6. Pt will improved DGI without AD by 4 points to indicate increased safety and reduced fall risk and improved community access.  Baseline: to be completed 08/20/23: 18/24  Status : NEW   PLAN:  PT FREQUENCY: 1-2x/week  PT DURATION: 8 weeks  PLANNED INTERVENTIONS: Therapeutic exercises, Therapeutic activity, Neuromuscular re-education, Balance training, Gait training, Patient/Family education, Self Care, Joint mobilization, Stair training, Vestibular training, Canalith repositioning, DME instructions, Dry Needling, Electrical stimulation,  Cryotherapy, Moist heat, scar mobilization, Taping, Manual therapy, and Re-evaluation.  PLAN FOR NEXT SESSION:  Coordination, balance and BLE strengthening.  Functional gait training. High level balance   Precious Bard PT   Physical Therapist - [facility]  09/10/23, 2:03 PM

## 2023-09-10 ENCOUNTER — Ambulatory Visit: Payer: 59

## 2023-09-10 DIAGNOSIS — M6281 Muscle weakness (generalized): Secondary | ICD-10-CM

## 2023-09-10 DIAGNOSIS — R2681 Unsteadiness on feet: Secondary | ICD-10-CM

## 2023-09-10 DIAGNOSIS — R262 Difficulty in walking, not elsewhere classified: Secondary | ICD-10-CM

## 2023-09-11 ENCOUNTER — Encounter: Payer: Self-pay | Admitting: Cardiology

## 2023-09-11 ENCOUNTER — Ambulatory Visit: Payer: 59 | Attending: Cardiology | Admitting: Cardiology

## 2023-09-11 VITALS — BP 140/67 | HR 92 | Ht 72.0 in | Wt 213.6 lb

## 2023-09-11 DIAGNOSIS — I493 Ventricular premature depolarization: Secondary | ICD-10-CM

## 2023-09-11 DIAGNOSIS — I5022 Chronic systolic (congestive) heart failure: Secondary | ICD-10-CM | POA: Diagnosis not present

## 2023-09-11 DIAGNOSIS — I2510 Atherosclerotic heart disease of native coronary artery without angina pectoris: Secondary | ICD-10-CM | POA: Diagnosis not present

## 2023-09-11 MED ORDER — METOPROLOL SUCCINATE ER 50 MG PO TB24: 50.0000 mg | ORAL_TABLET | Freq: Two times a day (BID) | ORAL | 3 refills | Status: DC

## 2023-09-11 MED ORDER — MEXILETINE HCL 200 MG PO CAPS: 200.0000 mg | ORAL_CAPSULE | Freq: Two times a day (BID) | ORAL | 3 refills | Status: DC

## 2023-09-11 NOTE — Patient Instructions (Addendum)
Medication Instructions:  Your physician has recommended you make the following change in your medication:  1) STOP taking carvedilol  2) START taking Toprol XL (metoprolol succinate) 50 mg twice daily 3) START taking Mexiletine 200 mg twice daily   *If you need a refill on your cardiac medications before your next appointment, please call your pharmacy*  Testing/Procedures: Heart Monitor  Your physician has recommended that you wear an event monitor. Event monitors are medical devices that record the heart's electrical activity. Doctors most often [place] these monitors to diagnose arrhythmias. Arrhythmias are problems with the speed or rhythm of the heartbeat. The monitor is a small, portable device. You can wear one while you do your normal daily activities. This is usually used to diagnose what is causing palpitations/syncope (passing out).  Follow-Up: At [facility], you and your health needs are our priority.  As part of our continuing mission to provide you with exceptional heart care, we have created designated Provider Care Teams.  These Care Teams include your primary Cardiologist (physician) and Advanced Practice Providers (APPs -  Physician Assistants and Nurse Practitioners) who all work together to provide you with the care you need, when you need it.  Your next appointment:   3 months  Provider:   Sherie Don, NP

## 2023-09-11 NOTE — Progress Notes (Signed)
Electrophysiology Office Note:   Date:  09/11/2023  ID:  Lawrence Holmes, DOB [birth year], MRN [id number]  Primary Cardiologist: Lorine Bears, MD Primary Heart Failure: None Electrophysiologist: Nobie Putnam, MD      History of Present Illness:   Lawrence Holmes is a 68 y.o. male with h/o late presenting STEMI/CAD in December 2018 (medically managed), chronic systolic heart failure secondary to ischemic cardiomyopathy, type 2 diabetes mellitus, hypertension, and hyperlipidemia who is being seen today for PVCs at the request of Dr. Kirke Corin.   Patient presented to general cardiology follow up and was found to have PVCs on EKG. Echocardiogram showed LVEF 40-45%. Zio monitor was ordered to assess PVC burden. Burden was 14%. There were no patient triggered episodes. The patient denies any symptoms related to his PVCs. He denies any palpitations. He continues to work and has been doing well. He denies shortness of breath, dyspnea on exertion, PND, orthopnea, lower extremity edema.   Review of systems complete and found to be negative unless listed in HPI.   EP Information / Studies Reviewed:    EKG is not ordered today. EKG from 06/05/23 reviewed which showed sinus rhythm with PVCs.      Echo 07/31/23:  1. Left ventricular ejection fraction, by estimation, is 40 to 45%. The  left ventricle has mildly decreased function. The left ventricle  demonstrates regional wall motion abnormalities (hypokinesis of the  inferior/posterior wall, basal to mid septal  region). Left ventricular diastolic parameters are consistent with Grade I  diastolic dysfunction (impaired relaxation). The average left ventricular  global longitudinal strain is -14.2 %.   2. Right ventricular systolic function is normal. The right ventricular  size is normal. Tricuspid regurgitation signal is inadequate for assessing  PA pressure.   3. The mitral valve is normal in structure. No evidence of mitral valve  regurgitation. No  evidence of mitral stenosis.   4. The aortic valve is tricuspid. Aortic valve regurgitation is not  visualized. No aortic stenosis is present.   5. The inferior vena cava is normal in size with greater than 50%  respiratory variability, suggesting right atrial pressure of 3 mmHg.   Zio 06/2023: Patch Wear Time:  2 days and 18 hours (2024-10-27T11:40:41-0400 to 2024-10-30T06:32:16-0400) Patient had a min HR of 57 bpm, max HR of 133 bpm, and avg HR of 81 bpm. Predominant underlying rhythm was Sinus Rhythm. 1 run of Supraventricular Tachycardia occurred lasting 7 beats with a max rate of 133 bpm (avg 126 bpm).  Occasional PACs with a burden of 1.3%. Frequent PVCs with a burden of 14%. Some PVCs may be PACs with possible aberrancy.      Physical Exam:   VS:  There were no vitals taken for this visit.   Wt Readings from Last 3 Encounters:  08/27/23 206 lb (93.4 kg)  08/01/23 206 lb 3.2 oz (93.5 kg)  07/24/23 205 lb (93 kg)     GEN: Well nourished, well developed in no acute distress NECK: No JVD CARDIAC: Normal rate and regular rhythm RESPIRATORY:  Clear to auscultation without rales, wheezing or rhonchi  ABDOMEN: Soft, non-tender, non-distended EXTREMITIES:  No edema; No deformity   ASSESSMENT AND PLAN:   Lawrence Holmes is a 68 y.o. male with h/o late presenting STEMI/CAD in December 2018 (medically managed), chronic systolic heart failure secondary to ischemic cardiomyopathy, type 2 diabetes mellitus, hypertension, and hyperlipidemia who is being seen today for PVCs at the request of Dr. Kirke Corin.   #PVCs: RBBB, superior  axis, indeterminate L/R axis. Appear most consistent with pap muscle origin. LV dysfunction predates his PVCs. EF change between echo in 2023 and most recent echo is within margin of error of 5%, so unclear if PVCs are playing any role in his cardiomyopathy. Importantly, he does not have any signs or symptoms of clinical heart failure. Additionally, he denies any  palpitations.  - We will change carvedilol (uptitration limited by blood pressure) to metoprolol XL 50mg  twice daily.  - Add mexiletine 200mg  twice daily. - Repeat Zio monitor in 1 month to reassess burden.  #Chronic systolic heart failure: LVEF most recently 40-45%. - Well compensated.  - Continue excellent GDMT - metoprolol, Entresto, spironolactone, Jardiance.   #CAD c/b STEMI in 07/2017:  - No chest pain.  - Continue aspirin and statin.    Signed, Nobie Putnam, MD

## 2023-09-12 ENCOUNTER — Ambulatory Visit: Payer: 59

## 2023-09-16 ENCOUNTER — Other Ambulatory Visit: Payer: Self-pay | Admitting: Cardiovascular Disease

## 2023-09-17 ENCOUNTER — Ambulatory Visit: Payer: 59

## 2023-09-17 DIAGNOSIS — R2681 Unsteadiness on feet: Secondary | ICD-10-CM

## 2023-09-17 DIAGNOSIS — R262 Difficulty in walking, not elsewhere classified: Secondary | ICD-10-CM

## 2023-09-17 DIAGNOSIS — R278 Other lack of coordination: Secondary | ICD-10-CM

## 2023-09-17 DIAGNOSIS — M6281 Muscle weakness (generalized): Secondary | ICD-10-CM

## 2023-09-17 NOTE — Therapy (Signed)
OUTPATIENT PHYSICAL THERAPY TREATMENT    Patient Name: Lawrence Holmes MRN: [id number] DOB:1955-11-26, 68 y.o., male Today's Date: 09/17/2023  END OF SESSION:  PT End of Session - 09/17/23 1301     Visit Number 26    Number of Visits 31    Date for PT Re-Evaluation 09/21/23    Authorization Type [employer]    Progress Note Due on Visit 30    PT Start Time 1314    PT Stop Time 1359    PT Time Calculation (min) 45 min    Equipment Utilized During Treatment Gait belt    Activity Tolerance Patient tolerated treatment well    Behavior During Therapy WFL for tasks assessed/performed                   Past Medical History:  Diagnosis Date   Aortic atherosclerosis (HCC)    Arthritis    BPH with urinary obstruction    Chronic systolic heart failure (HCC) 07/23/2017   a. TTE 07/23/2017: EF 30-35%, mild LVH, inferolateral and inferior AK, mild BAE, G1DD; b. TTE 10/23/2017: EF 40-45%, mild LVH, basal-mid inferior and inferoseptal AK, mild MR; c. TTE 11/19/2021: EF 45-50%, glob HK with mod to severe inferior and basal to mid septal HK, GLS -15.3%, mild MR, G1DD; d. 07/2023 Echo: EF 40-45%, inf/post HK, GrI DD, nl RV fxn.   Coronary artery disease 07/21/2017   a. 07/2017 Late presenting inferior STEMI/Cath: LM nl, LAD min irregs, LCX 100d (L->L collats), RCA 100p (L->R collats), EF 25-35%-->Med Rx; b. 10/2017 ETT (DOT): Ex time 7:38, baseline antlat TWI, no acute changes; c. 09/2019 ETT: Ex time 6:49. No ECG changes.   DDD (degenerative disc disease), lumbar    a.) s/p MIS L2-3 laminectomy and disectomy   Diverticulosis of colon    Erectile dysfunction    HTN (hypertension)    Hx of bilateral cataract extraction    Hyperlipidemia    Ischemic cardiomyopathy    a. LHC 07/21/2017: EF 25-35%; b. TTE 07/23/2017: 30-35%; c. TTE 10/23/2017: EF 40-45%; d. TTE 11/19/2021: EF 45-50%; e. 07/2023 Echo: EF 40-45%.   Left inguinal hernia    a.) s/p repair 05/19/2022   PVC's (premature  ventricular contractions)    a. 06/2023 72 hr Zio: Predominantly sinus rhythm at an average rate of 81 (57-1 33).  1 7 beat run of SVT at 133.  1.3% PAC burden.  14% PVC burden.   ST elevation myocardial infarction (STEMI) of inferior wall (HCC) 07/21/2017   a. LHC 07/21/2017: EF 25-35%, LVEDP 14 mmHg. 40% pLCx, 40% mLCx, 100% dLCx, 100% pRCA --> attempted to cross wire at RCA lesion, however unable. Reasonable RCA collaterals already formed --> med mgmt.   T2DM (type 2 diabetes mellitus) (HCC)    Tobacco use    Past Surgical History:  Procedure Laterality Date   CATARACT EXTRACTION Bilateral    COLONOSCOPY WITH PROPOFOL N/A 04/04/2019   Procedure: COLONOSCOPY WITH PROPOFOL;  Surgeon: Pasty Spillers, MD;  Location: [facility];  Service: Endoscopy;  Laterality: N/A;   CYSTOSCOPY WITH FULGERATION N/A 05/14/2023   Procedure: CYSTOSCOPY WITH CLOT EVACUATION AND FULGERATION;  Surgeon: Vanna Scotland, MD;  Location: [facility];  Service: Urology;  Laterality: N/A;   HOLEP-LASER ENUCLEATION OF THE PROSTATE WITH MORCELLATION N/A 06/08/2023   Procedure: HOLEP-LASER ENUCLEATION OF THE PROSTATE WITH MORCELLATION;  Surgeon: Sondra Come, MD;  Location: [facility];  Service: Urology;  Laterality: N/A;   LEFT HEART CATH AND CORONARY  ANGIOGRAPHY N/A 07/21/2017   Procedure: LEFT HEART CATH AND CORONARY ANGIOGRAPHY;  Surgeon: Iran Ouch, MD;  Location: [facility];  Service: Cardiovascular;  Laterality: N/A;   LUMBAR LAMINECTOMY/DECOMPRESSION MICRODISCECTOMY Bilateral 04/05/2023   Procedure: MINIMALLY INVASIVE (MIS) L2-3 LAMINECTOMY AND DISCECTOMY;  Surgeon: Loreen Freud, MD;  Location: [facility];  Service: Neurosurgery;  Laterality: Bilateral;   XI ROBOTIC ASSISTED INGUINAL HERNIA REPAIR WITH MESH Left 05/19/2022   Procedure: XI ROBOTIC ASSISTED INGUINAL HERNIA REPAIR WITH MESH;  Surgeon: Campbell Lerner, MD;  Location: [facility];  Service: General;  Laterality: Left;   Patient  Active Problem List   Diagnosis Date Noted   S/P lumbar microdiscectomy 05/16/2023   Hematuria 05/12/2023   Acute urinary retention 05/12/2023   Bilateral hydronephrosis 05/12/2023   HTN (hypertension) 05/12/2023   HLD (hyperlipidemia) 05/12/2023   Type II diabetes mellitus with renal manifestations (HCC) 05/12/2023   Chronic kidney disease, stage 3a (HCC) 05/12/2023   Chronic systolic CHF (congestive heart failure) (HCC) 05/12/2023   BPH (benign prostatic hyperplasia) 05/12/2023   UTI (urinary tract infection) 05/12/2023   CAD (coronary artery disease) 05/12/2023   Intervertebral lumbar disc disorder with myelopathy, lumbar region 04/02/2023   Impaired functional mobility, balance, gait, and endurance 03/26/2023   Bilateral leg weakness 03/26/2023   Abnormal prostate exam 05/09/2022   Aortic atherosclerosis (HCC) 02/24/2022   Smokes with greater than 30 pack year history 11/18/2021   Coronary artery disease of native artery of native heart with stable angina pectoris (HCC) 05/19/2021   Dyslipidemia 07/30/2020   Polyp of colon    Type 2 diabetes mellitus, controlled (HCC) 02/08/2018   History of myocardial infarction 11/08/2017   Tobacco use 11/08/2017   Ischemic cardiomyopathy 11/08/2017   CHF (congestive heart failure) (HCC) 11/08/2017   Foot callus 11/08/2017   Onychomycosis of multiple toenails with type 2 diabetes mellitus (HCC) 11/08/2017   Cataract, nuclear sclerotic, left eye 04/04/2016    PCP: Danelle Berry, PA-C  REFERRING PROVIDER: Oswaldo Conroy Mecum, PA-C  REFERRING DIAG:  M51.06 (ICD-10-CM) - Intervertebral lumbar disc disorder with myelopathy, lumbar region  Z74.09 (ICD-10-CM) - Impaired functional mobility, balance, gait, and endurance  R29.898 (ICD-10-CM) - Bilateral leg weakness  Z98.890 (ICD-10-CM) - S/P lumbar microdiscectomy    Rationale for Evaluation and Treatment: Rehabilitation  THERAPY DIAG:  Unsteadiness on feet  Difficulty in walking, not elsewhere  classified  Muscle weakness (generalized)  Other lack of coordination  ONSET DATE: 03/2023  SUBJECTIVE:  SUBJECTIVE STATEMENT:  Patient reports he watched football over the weekend. No falls or LOB since last session.     PERTINENT HISTORY:  Approximately 6 weeks status post Right L2-L3 Laminectomy and microdiscectomy (04/05/23).  He states that he has graduated from his wheelchair and is almost only using his walker, trying to graduate to his cane.  Patient has a past medical history of arthritis, congestive heart failure, CID, CDD, enlarged prostate, HTN, HLD, left inguinal hernia, type 2 diabetes. 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    PAIN:  Are you having pain? No     PRECAUTIONS: Back   He can increase his weight to 25 pounds  RED FLAGS: None   WEIGHT BEARING RESTRICTIONS:  No lifting more than 25 pounds   FALLS:  Has patient fallen in last 6 months? No  LIVING ENVIRONMENT: Lives with: lives alone Lives in: House/apartment Stairs: Yes: External: 3 steps; on left going up Has following equipment at home: Single point cane and Walker - 2 wheeled, grab bars in shower   OCCUPATION: Engineer, drilling   PLOF: Independent  PATIENT GOALS: "Get back to walking without a walker or cane"   NEXT MD VISIT: 06/08/23 : prostate procedure   OBJECTIVE:  Note: Objective measures were completed at Evaluation unless otherwise noted.  DIAGNOSTIC FINDINGS:  IMPRESSION from MRI 03/26/23 1. Severe spinal canal narrowing at L2-L3 secondary to a combination of ligamentum flavum hypertrophy, short pedicles, and a disc bulge. 2. Severe left and moderate right neural foraminal narrowing at L5-S1.   PATIENT  SURVEYS:  FOTO 05/29/23: 38  Typical result : 51   COGNITION: Overall cognitive status: Within functional limits for tasks assessed     SENSATION: Light touch: WFL   LOWER EXTREMITY MMT:    MMT Right eval Left eval  Hip flexion 4-/5 4-/5  Hip extension    Hip abduction 4 4  Hip adduction 4- 4  Hip internal rotation    Hip external rotation    Knee flexion 4-/5 4-/5  Knee extension 4+ 4+  Ankle dorsiflexion 4/5 4/5  Ankle plantarflexion 4-/5 4-/5  Ankle inversion    Ankle eversion     (Blank rows = not tested)    FUNCTIONAL TESTS:  5 times sit to stand: 16.59s intermittent hand use 10 meter walk test: 20.97  .63m/s with walker    TODAY'S TREATMENT: DATE: 09/17/23  Unless otherwise stated, CGA was provided and gait belt donned in order to ensure pt safety  TE Nustep Intervals: lvl 2-5; seat position 12; x6 minutes    Leg press;  Single leg #40; 8x; 3 sets each LE; second and third set 10x  TrA march with green swiss ball on table : 10x; each LE ;2 sets  TrA cross body march/raise arms 10x standing at plinth table   4lb ankle weights: -march 12x each LE -LAQ 12x each LE  -heel raise 15x -lateral step out/in 15x each LE   Pt requires therapeutic rest breaks throughout  PATIENT EDUCATION:  Education details: HEP, POC, goals  Person educated: Patient Education method: Explanation, Demonstration, and Handouts Education comprehension: verbalized understanding and returned demonstration  HOME EXERCISE PROGRAM: Access Code: [email or web address] URL: [email or web address] Date: 05/29/2023 Prepared by: Precious Bard  Exercises - Standing March with Counter Support  - 1 x daily - 7 x weekly - 2 sets - 10 reps - 5 hold - Heel Raises with Counter Support  -  1 x daily - 7 x weekly - 2 sets - 10 reps - 5 hold - Sit to Stand Without Arm Support  - 1 x daily - 7 x weekly - 2 sets - 10 reps - 5 hold  ASSESSMENT:  CLINICAL IMPRESSION:  Patient tolerates  progressive strengthening and mobility interventions this session. He is highly motivated throughout session. Is aware goals will be assessed next session. Pt will continue to benefit from skilled therapy to address remaining deficits in order to improve overall QoL and return to PLOF.      OBJECTIVE IMPAIRMENTS: Abnormal gait, decreased activity tolerance, decreased balance, decreased endurance, decreased mobility, difficulty walking, decreased strength, impaired flexibility, and improper body mechanics.   ACTIVITY LIMITATIONS: carrying, lifting, bending, standing, squatting, stairs, transfers, bed mobility, dressing, and locomotion level  PARTICIPATION LIMITATIONS: meal prep, cleaning, laundry, driving, shopping, occupation, and yard work  PERSONAL FACTORS: arthritis, congestive heart failure, CID, CDD, enlarged prostate, HTN, HLD, left inguinal hernia, type 2 diabetes are also affecting patient's functional outcome.   REHAB POTENTIAL: Good  CLINICAL DECISION MAKING: Evolving/moderate complexity  EVALUATION COMPLEXITY: Moderate   GOALS: Goals reviewed with patient? Yes  SHORT TERM GOALS: Target date: 08/24/2023    Patient will demonstrates ability to ambulate for >273ft without AD with supervision assist to allow improved access to community  Goal status: initial    LONG TERM GOALS: Target date: 09/20/2022  Patient will increase 10 meter walk test to >1.61m/s as to improve gait speed for better community ambulation and to reduce fall risk. Baseline: .47 m/s; 07/09/2023= 0.58 m/s with and without cane 07/26/23: 0.644m/s with [facility]  08/16/23: 0.74 m/s without SPC; 0.92 m/s with SPC Goal status: PROGRESSING  2.  Patient will increase FOTO score to equal to or greater than 51 to demonstrate statistically significant improvement in mobility and quality of life. Baseline: 05/29/23: 38 07/09/2023= 49 07/26/23:47 08/16/23: 47 Goal status: PROGRESSING  3.  Patient (> 39 years old) will  complete five times sit to stand test in < 15 seconds indicating an increased LE strength and improved balance. Baseline: 16.59 Intermittent hand use; 07/09/2023= 18.45 sec without UE support  07/26/23: 16.32 without UE Support  08/16/23: 11.70 without UE support Goal status: MET  4. Patient will ambulate 150 ft without the use of an assistive device in order to increase independence and improve quality of life.  Baseline: Not tested  07/26/23: able to ambulate >126ft with no AD and CGA only. Mild staggering R but no LOB  08/16/23: Pt able to ambulate 300' with no AD and CGA only.   goal status: PROGRESSING  5.  Patient will increase Berg Balance score by > 6 points to demonstrate decreased fall risk during functional activities. Baseline: awaiting testing until precautions are lifted; 07/10/2023= 41/56 07/26/23: 43/56 08/16/23: 48/56 Goal status: PROGRESSING  6. Pt will improved DGI without AD by 4 points to indicate increased safety and reduced fall risk and improved community access.  Baseline: to be completed 08/20/23: 18/24  Status : NEW   PLAN:  PT FREQUENCY: 1-2x/week  PT DURATION: 8 weeks  PLANNED INTERVENTIONS: Therapeutic exercises, Therapeutic activity, Neuromuscular re-education, Balance training, Gait training, Patient/Family education, Self Care, Joint mobilization, Stair training, Vestibular training, Canalith repositioning, DME instructions, Dry Needling, Electrical stimulation, Cryotherapy, Moist heat, scar mobilization, Taping, Manual therapy, and Re-evaluation.  PLAN FOR NEXT SESSION:  Coordination, balance and BLE strengthening.  Functional gait training. High level balance   Precious Bard PT   Physical Therapist -  [facility]  [facility]  09/17/23, 2:00 PM

## 2023-09-18 NOTE — Therapy (Signed)
OUTPATIENT PHYSICAL THERAPY TREATMENT / RECERT ***    Patient Name: Lawrence Holmes MRN: [id number] DOB:1956/02/03, 68 y.o., male Today's Date: 09/18/2023  END OF SESSION:          Past Medical History:  Diagnosis Date   Aortic atherosclerosis (HCC)    Arthritis    BPH with urinary obstruction    Chronic systolic heart failure (HCC) 07/23/2017   a. TTE 07/23/2017: EF 30-35%, mild LVH, inferolateral and inferior AK, mild BAE, G1DD; b. TTE 10/23/2017: EF 40-45%, mild LVH, basal-mid inferior and inferoseptal AK, mild MR; c. TTE 11/19/2021: EF 45-50%, glob HK with mod to severe inferior and basal to mid septal HK, GLS -15.3%, mild MR, G1DD; d. 07/2023 Echo: EF 40-45%, inf/post HK, GrI DD, nl RV fxn.   Coronary artery disease 07/21/2017   a. 07/2017 Late presenting inferior STEMI/Cath: LM nl, LAD min irregs, LCX 100d (L->L collats), RCA 100p (L->R collats), EF 25-35%-->Med Rx; b. 10/2017 ETT (DOT): Ex time 7:38, baseline antlat TWI, no acute changes; c. 09/2019 ETT: Ex time 6:49. No ECG changes.   DDD (degenerative disc disease), lumbar    a.) s/p MIS L2-3 laminectomy and disectomy   Diverticulosis of colon    Erectile dysfunction    HTN (hypertension)    Hx of bilateral cataract extraction    Hyperlipidemia    Ischemic cardiomyopathy    a. LHC 07/21/2017: EF 25-35%; b. TTE 07/23/2017: 30-35%; c. TTE 10/23/2017: EF 40-45%; d. TTE 11/19/2021: EF 45-50%; e. 07/2023 Echo: EF 40-45%.   Left inguinal hernia    a.) s/p repair 05/19/2022   PVC's (premature ventricular contractions)    a. 06/2023 72 hr Zio: Predominantly sinus rhythm at an average rate of 81 (57-1 33).  1 7 beat run of SVT at 133.  1.3% PAC burden.  14% PVC burden.   ST elevation myocardial infarction (STEMI) of inferior wall (HCC) 07/21/2017   a. LHC 07/21/2017: EF 25-35%, LVEDP 14 mmHg. 40% pLCx, 40% mLCx, 100% dLCx, 100% pRCA --> attempted to cross wire at RCA lesion, however unable. Reasonable RCA collaterals already formed  --> med mgmt.   T2DM (type 2 diabetes mellitus) (HCC)    Tobacco use    Past Surgical History:  Procedure Laterality Date   CATARACT EXTRACTION Bilateral    COLONOSCOPY WITH PROPOFOL N/A 04/04/2019   Procedure: COLONOSCOPY WITH PROPOFOL;  Surgeon: Pasty Spillers, MD;  Location: [facility];  Service: Endoscopy;  Laterality: N/A;   CYSTOSCOPY WITH FULGERATION N/A 05/14/2023   Procedure: CYSTOSCOPY WITH CLOT EVACUATION AND FULGERATION;  Surgeon: Vanna Scotland, MD;  Location: [facility];  Service: Urology;  Laterality: N/A;   HOLEP-LASER ENUCLEATION OF THE PROSTATE WITH MORCELLATION N/A 06/08/2023   Procedure: HOLEP-LASER ENUCLEATION OF THE PROSTATE WITH MORCELLATION;  Surgeon: Sondra Come, MD;  Location: [facility];  Service: Urology;  Laterality: N/A;   LEFT HEART CATH AND CORONARY ANGIOGRAPHY N/A 07/21/2017   Procedure: LEFT HEART CATH AND CORONARY ANGIOGRAPHY;  Surgeon: Iran Ouch, MD;  Location: [facility];  Service: Cardiovascular;  Laterality: N/A;   LUMBAR LAMINECTOMY/DECOMPRESSION MICRODISCECTOMY Bilateral 04/05/2023   Procedure: MINIMALLY INVASIVE (MIS) L2-3 LAMINECTOMY AND DISCECTOMY;  Surgeon: Loreen Freud, MD;  Location: [facility];  Service: Neurosurgery;  Laterality: Bilateral;   XI ROBOTIC ASSISTED INGUINAL HERNIA REPAIR WITH MESH Left 05/19/2022   Procedure: XI ROBOTIC ASSISTED INGUINAL HERNIA REPAIR WITH MESH;  Surgeon: Campbell Lerner, MD;  Location: [facility];  Service: General;  Laterality: Left;  Patient Active Problem List   Diagnosis Date Noted   S/P lumbar microdiscectomy 05/16/2023   Hematuria 05/12/2023   Acute urinary retention 05/12/2023   Bilateral hydronephrosis 05/12/2023   HTN (hypertension) 05/12/2023   HLD (hyperlipidemia) 05/12/2023   Type II diabetes mellitus with renal manifestations (HCC) 05/12/2023   Chronic kidney disease, stage 3a (HCC) 05/12/2023   Chronic systolic CHF (congestive heart failure) (HCC) 05/12/2023    BPH (benign prostatic hyperplasia) 05/12/2023   UTI (urinary tract infection) 05/12/2023   CAD (coronary artery disease) 05/12/2023   Intervertebral lumbar disc disorder with myelopathy, lumbar region 04/02/2023   Impaired functional mobility, balance, gait, and endurance 03/26/2023   Bilateral leg weakness 03/26/2023   Abnormal prostate exam 05/09/2022   Aortic atherosclerosis (HCC) 02/24/2022   Smokes with greater than 30 pack year history 11/18/2021   Coronary artery disease of native artery of native heart with stable angina pectoris (HCC) 05/19/2021   Dyslipidemia 07/30/2020   Polyp of colon    Type 2 diabetes mellitus, controlled (HCC) 02/08/2018   History of myocardial infarction 11/08/2017   Tobacco use 11/08/2017   Ischemic cardiomyopathy 11/08/2017   CHF (congestive heart failure) (HCC) 11/08/2017   Foot callus 11/08/2017   Onychomycosis of multiple toenails with type 2 diabetes mellitus (HCC) 11/08/2017   Cataract, nuclear sclerotic, left eye 04/04/2016    PCP: Danelle Berry, PA-C  REFERRING PROVIDER: Oswaldo Conroy Mecum, PA-C  REFERRING DIAG:  M51.06 (ICD-10-CM) - Intervertebral lumbar disc disorder with myelopathy, lumbar region  Z74.09 (ICD-10-CM) - Impaired functional mobility, balance, gait, and endurance  R29.898 (ICD-10-CM) - Bilateral leg weakness  Z98.890 (ICD-10-CM) - S/P lumbar microdiscectomy    Rationale for Evaluation and Treatment: Rehabilitation  THERAPY DIAG:  No diagnosis found.  ONSET DATE: 03/2023  SUBJECTIVE:                                                                                                                                                                                           SUBJECTIVE STATEMENT:  ***    PERTINENT HISTORY:  Approximately 6 weeks status post Right L2-L3 Laminectomy and microdiscectomy (04/05/23).  He states that he has graduated from his wheelchair and is almost only using his walker, trying to graduate to his  cane.  Patient has a past medical history of arthritis, congestive heart failure, CID, CDD, enlarged prostate, HTN, HLD, left inguinal hernia, type 2 diabetes. 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    PAIN:  Are you having pain? No     PRECAUTIONS: Back   He can increase his weight to  25 pounds  RED FLAGS: None   WEIGHT BEARING RESTRICTIONS:  No lifting more than 25 pounds   FALLS:  Has patient fallen in last 6 months? No  LIVING ENVIRONMENT: Lives with: lives alone Lives in: House/apartment Stairs: Yes: External: 3 steps; on left going up Has following equipment at home: Single point cane and Walker - 2 wheeled, grab bars in shower   OCCUPATION: Engineer, drilling   PLOF: Independent  PATIENT GOALS: "Get back to walking without a walker or cane"   NEXT MD VISIT: 06/08/23 : prostate procedure   OBJECTIVE:  Note: Objective measures were completed at Evaluation unless otherwise noted.  DIAGNOSTIC FINDINGS:  IMPRESSION from MRI 03/26/23 1. Severe spinal canal narrowing at L2-L3 secondary to a combination of ligamentum flavum hypertrophy, short pedicles, and a disc bulge. 2. Severe left and moderate right neural foraminal narrowing at L5-S1.   PATIENT SURVEYS:  FOTO 05/29/23: 38  Typical result : 51   COGNITION: Overall cognitive status: Within functional limits for tasks assessed     SENSATION: Light touch: WFL   LOWER EXTREMITY MMT:    MMT Right eval Left eval  Hip flexion 4-/5 4-/5  Hip extension    Hip abduction 4 4  Hip adduction 4- 4  Hip internal rotation    Hip external rotation    Knee flexion 4-/5 4-/5  Knee extension 4+ 4+  Ankle dorsiflexion 4/5 4/5  Ankle plantarflexion 4-/5 4-/5  Ankle inversion    Ankle eversion     (Blank rows = not tested)    FUNCTIONAL TESTS:  5 times sit to stand: 16.59s intermittent hand use 10 meter walk test: 20.97  .23m/s  with walker    TODAY'S TREATMENT: DATE: 09/18/23  Unless otherwise stated, CGA was provided and gait belt donned in order to ensure pt safety  TE Nustep Intervals: lvl 2-5; seat position 12; x6 minutes    Leg press;  Single leg #40; 8x; 3 sets each LE; second and third set 10x  TrA march with green swiss ball on table : 10x; each LE ;2 sets  TrA cross body march/raise arms 10x standing at plinth table   4lb ankle weights: -march 12x each LE -LAQ 12x each LE  -heel raise 15x -lateral step out/in 15x each LE   Pt requires therapeutic rest breaks throughout  PATIENT EDUCATION:  Education details: HEP, POC, goals  Person educated: Patient Education method: Explanation, Demonstration, and Handouts Education comprehension: verbalized understanding and returned demonstration  HOME EXERCISE PROGRAM: Access Code: [email or web address] URL: [email or web address] Date: 05/29/2023 Prepared by: Precious Bard  Exercises - Standing March with Counter Support  - 1 x daily - 7 x weekly - 2 sets - 10 reps - 5 hold - Heel Raises with Counter Support  - 1 x daily - 7 x weekly - 2 sets - 10 reps - 5 hold - Sit to Stand Without Arm Support  - 1 x daily - 7 x weekly - 2 sets - 10 reps - 5 hold  ASSESSMENT:  CLINICAL IMPRESSION: *** Pt will continue to benefit from skilled therapy to address remaining deficits in order to improve overall QoL and return to PLOF.      OBJECTIVE IMPAIRMENTS: Abnormal gait, decreased activity tolerance, decreased balance, decreased endurance, decreased mobility, difficulty walking, decreased strength, impaired flexibility, and improper body mechanics.   ACTIVITY LIMITATIONS: carrying, lifting, bending, standing, squatting, stairs, transfers, bed mobility, dressing, and locomotion level  PARTICIPATION LIMITATIONS: meal prep, cleaning, laundry, driving, shopping,  occupation, and yard work  PERSONAL FACTORS: arthritis, congestive heart failure, CID, CDD,  enlarged prostate, HTN, HLD, left inguinal hernia, type 2 diabetes are also affecting patient's functional outcome.   REHAB POTENTIAL: Good  CLINICAL DECISION MAKING: Evolving/moderate complexity  EVALUATION COMPLEXITY: Moderate   GOALS: Goals reviewed with patient? Yes  SHORT TERM GOALS: Target date: 08/24/2023    Patient will demonstrates ability to ambulate for >233ft without AD with supervision assist to allow improved access to community  Goal status: initial    LONG TERM GOALS: Target date: 09/20/2022  Patient will increase 10 meter walk test to >1.68m/s as to improve gait speed for better community ambulation and to reduce fall risk. Baseline: .47 m/s; 07/09/2023= 0.58 m/s with and without cane 07/26/23: 0.68m/s with [facility]  08/16/23: 0.74 m/s without SPC; 0.92 m/s with SPC Goal status: PROGRESSING  2.  Patient will increase FOTO score to equal to or greater than 51 to demonstrate statistically significant improvement in mobility and quality of life. Baseline: 05/29/23: 38 07/09/2023= 49 07/26/23:47 08/16/23: 47 Goal status: PROGRESSING  3.  Patient (> 65 years old) will complete five times sit to stand test in < 15 seconds indicating an increased LE strength and improved balance. Baseline: 16.59 Intermittent hand use; 07/09/2023= 18.45 sec without UE support  07/26/23: 16.32 without UE Support  08/16/23: 11.70 without UE support Goal status: MET  4. Patient will ambulate 150 ft without the use of an assistive device in order to increase independence and improve quality of life.  Baseline: Not tested  07/26/23: able to ambulate >181ft with no AD and CGA only. Mild staggering R but no LOB  08/16/23: Pt able to ambulate 300' with no AD and CGA only.   goal status: PROGRESSING  5.  Patient will increase Berg Balance score by > 6 points to demonstrate decreased fall risk during functional activities. Baseline: awaiting testing until precautions are lifted; 07/10/2023=  41/56 07/26/23: 43/56 08/16/23: 48/56 Goal status: PROGRESSING  6. Pt will improved DGI without AD by 4 points to indicate increased safety and reduced fall risk and improved community access.  Baseline: to be completed 08/20/23: 18/24  Status : NEW   PLAN:  PT FREQUENCY: 1-2x/week  PT DURATION: 8 weeks  PLANNED INTERVENTIONS: Therapeutic exercises, Therapeutic activity, Neuromuscular re-education, Balance training, Gait training, Patient/Family education, Self Care, Joint mobilization, Stair training, Vestibular training, Canalith repositioning, DME instructions, Dry Needling, Electrical stimulation, Cryotherapy, Moist heat, scar mobilization, Taping, Manual therapy, and Re-evaluation.  PLAN FOR NEXT SESSION:  Coordination, balance and BLE strengthening.  Functional gait training. High level balance   Precious Bard PT   Physical Therapist - [facility]  09/18/23, 5:03 PM

## 2023-09-19 ENCOUNTER — Ambulatory Visit: Payer: 59

## 2023-09-19 DIAGNOSIS — R2681 Unsteadiness on feet: Secondary | ICD-10-CM

## 2023-09-19 DIAGNOSIS — M6281 Muscle weakness (generalized): Secondary | ICD-10-CM

## 2023-09-19 DIAGNOSIS — R262 Difficulty in walking, not elsewhere classified: Secondary | ICD-10-CM

## 2023-09-21 ENCOUNTER — Encounter: Payer: Self-pay | Admitting: Cardiology

## 2023-09-24 ENCOUNTER — Ambulatory Visit: Payer: 59 | Attending: Physician Assistant

## 2023-09-24 DIAGNOSIS — R2681 Unsteadiness on feet: Secondary | ICD-10-CM | POA: Diagnosis present

## 2023-09-24 DIAGNOSIS — R278 Other lack of coordination: Secondary | ICD-10-CM | POA: Insufficient documentation

## 2023-09-24 DIAGNOSIS — M6281 Muscle weakness (generalized): Secondary | ICD-10-CM | POA: Diagnosis present

## 2023-09-24 DIAGNOSIS — R262 Difficulty in walking, not elsewhere classified: Secondary | ICD-10-CM | POA: Diagnosis present

## 2023-09-24 NOTE — Therapy (Signed)
OUTPATIENT PHYSICAL THERAPY TREATMENT   Patient Name: Lawrence Holmes MRN: [id number] DOB:1956/02/23, 68 y.o., male Today's Date: 09/24/2023  END OF SESSION:  PT End of Session - 09/24/23 1310     Visit Number 28    Number of Visits 43    Date for PT Re-Evaluation 11/14/23    Authorization Type [employer]    Progress Note Due on Visit 30    PT Start Time 1315    PT Stop Time 1359    PT Time Calculation (min) 44 min    Equipment Utilized During Treatment Gait belt    Activity Tolerance Patient tolerated treatment well    Behavior During Therapy WFL for tasks assessed/performed                     Past Medical History:  Diagnosis Date   Aortic atherosclerosis (HCC)    Arthritis    BPH with urinary obstruction    Chronic systolic heart failure (HCC) 07/23/2017   a. TTE 07/23/2017: EF 30-35%, mild LVH, inferolateral and inferior AK, mild BAE, G1DD; b. TTE 10/23/2017: EF 40-45%, mild LVH, basal-mid inferior and inferoseptal AK, mild MR; c. TTE 11/19/2021: EF 45-50%, glob HK with mod to severe inferior and basal to mid septal HK, GLS -15.3%, mild MR, G1DD; d. 07/2023 Echo: EF 40-45%, inf/post HK, GrI DD, nl RV fxn.   Coronary artery disease 07/21/2017   a. 07/2017 Late presenting inferior STEMI/Cath: LM nl, LAD min irregs, LCX 100d (L->L collats), RCA 100p (L->R collats), EF 25-35%-->Med Rx; b. 10/2017 ETT (DOT): Ex time 7:38, baseline antlat TWI, no acute changes; c. 09/2019 ETT: Ex time 6:49. No ECG changes.   DDD (degenerative disc disease), lumbar    a.) s/p MIS L2-3 laminectomy and disectomy   Diverticulosis of colon    Erectile dysfunction    HTN (hypertension)    Hx of bilateral cataract extraction    Hyperlipidemia    Ischemic cardiomyopathy    a. LHC 07/21/2017: EF 25-35%; b. TTE 07/23/2017: 30-35%; c. TTE 10/23/2017: EF 40-45%; d. TTE 11/19/2021: EF 45-50%; e. 07/2023 Echo: EF 40-45%.   Left inguinal hernia    a.) s/p repair 05/19/2022   PVC's (premature  ventricular contractions)    a. 06/2023 72 hr Zio: Predominantly sinus rhythm at an average rate of 81 (57-1 33).  1 7 beat run of SVT at 133.  1.3% PAC burden.  14% PVC burden.   ST elevation myocardial infarction (STEMI) of inferior wall (HCC) 07/21/2017   a. LHC 07/21/2017: EF 25-35%, LVEDP 14 mmHg. 40% pLCx, 40% mLCx, 100% dLCx, 100% pRCA --> attempted to cross wire at RCA lesion, however unable. Reasonable RCA collaterals already formed --> med mgmt.   T2DM (type 2 diabetes mellitus) (HCC)    Tobacco use    Past Surgical History:  Procedure Laterality Date   CATARACT EXTRACTION Bilateral    COLONOSCOPY WITH PROPOFOL N/A 04/04/2019   Procedure: COLONOSCOPY WITH PROPOFOL;  Surgeon: Pasty Spillers, MD;  Location: [facility];  Service: Endoscopy;  Laterality: N/A;   CYSTOSCOPY WITH FULGERATION N/A 05/14/2023   Procedure: CYSTOSCOPY WITH CLOT EVACUATION AND FULGERATION;  Surgeon: Vanna Scotland, MD;  Location: [facility];  Service: Urology;  Laterality: N/A;   HOLEP-LASER ENUCLEATION OF THE PROSTATE WITH MORCELLATION N/A 06/08/2023   Procedure: HOLEP-LASER ENUCLEATION OF THE PROSTATE WITH MORCELLATION;  Surgeon: Sondra Come, MD;  Location: [facility];  Service: Urology;  Laterality: N/A;   LEFT HEART CATH AND  CORONARY ANGIOGRAPHY N/A 07/21/2017   Procedure: LEFT HEART CATH AND CORONARY ANGIOGRAPHY;  Surgeon: Iran Ouch, MD;  Location: [facility];  Service: Cardiovascular;  Laterality: N/A;   LUMBAR LAMINECTOMY/DECOMPRESSION MICRODISCECTOMY Bilateral 04/05/2023   Procedure: MINIMALLY INVASIVE (MIS) L2-3 LAMINECTOMY AND DISCECTOMY;  Surgeon: Loreen Freud, MD;  Location: [facility];  Service: Neurosurgery;  Laterality: Bilateral;   XI ROBOTIC ASSISTED INGUINAL HERNIA REPAIR WITH MESH Left 05/19/2022   Procedure: XI ROBOTIC ASSISTED INGUINAL HERNIA REPAIR WITH MESH;  Surgeon: Campbell Lerner, MD;  Location: [facility];  Service: General;  Laterality: Left;   Patient  Active Problem List   Diagnosis Date Noted   S/P lumbar microdiscectomy 05/16/2023   Hematuria 05/12/2023   Acute urinary retention 05/12/2023   Bilateral hydronephrosis 05/12/2023   HTN (hypertension) 05/12/2023   HLD (hyperlipidemia) 05/12/2023   Type II diabetes mellitus with renal manifestations (HCC) 05/12/2023   Chronic kidney disease, stage 3a (HCC) 05/12/2023   Chronic systolic CHF (congestive heart failure) (HCC) 05/12/2023   BPH (benign prostatic hyperplasia) 05/12/2023   UTI (urinary tract infection) 05/12/2023   CAD (coronary artery disease) 05/12/2023   Intervertebral lumbar disc disorder with myelopathy, lumbar region 04/02/2023   Impaired functional mobility, balance, gait, and endurance 03/26/2023   Bilateral leg weakness 03/26/2023   Abnormal prostate exam 05/09/2022   Aortic atherosclerosis (HCC) 02/24/2022   Smokes with greater than 30 pack year history 11/18/2021   Coronary artery disease of native artery of native heart with stable angina pectoris (HCC) 05/19/2021   Dyslipidemia 07/30/2020   Polyp of colon    Type 2 diabetes mellitus, controlled (HCC) 02/08/2018   History of myocardial infarction 11/08/2017   Tobacco use 11/08/2017   Ischemic cardiomyopathy 11/08/2017   CHF (congestive heart failure) (HCC) 11/08/2017   Foot callus 11/08/2017   Onychomycosis of multiple toenails with type 2 diabetes mellitus (HCC) 11/08/2017   Cataract, nuclear sclerotic, left eye 04/04/2016    PCP: Danelle Berry, PA-C  REFERRING PROVIDER: Oswaldo Conroy Mecum, PA-C  REFERRING DIAG:  M51.06 (ICD-10-CM) - Intervertebral lumbar disc disorder with myelopathy, lumbar region  Z74.09 (ICD-10-CM) - Impaired functional mobility, balance, gait, and endurance  R29.898 (ICD-10-CM) - Bilateral leg weakness  Z98.890 (ICD-10-CM) - S/P lumbar microdiscectomy    Rationale for Evaluation and Treatment: Rehabilitation  THERAPY DIAG:  Unsteadiness on feet  Difficulty in walking, not elsewhere  classified  Muscle weakness (generalized)  ONSET DATE: 03/2023  SUBJECTIVE:  SUBJECTIVE STATEMENT:  Patient reports a relaxing weekend.    PERTINENT HISTORY:  Approximately 6 weeks status post Right L2-L3 Laminectomy and microdiscectomy (04/05/23).  He states that he has graduated from his wheelchair and is almost only using his walker, trying to graduate to his cane.  Patient has a past medical history of arthritis, congestive heart failure, CID, CDD, enlarged prostate, HTN, HLD, left inguinal hernia, type 2 diabetes. 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    PAIN:  Are you having pain? No     PRECAUTIONS: Back   He can increase his weight to 25 pounds  RED FLAGS: None   WEIGHT BEARING RESTRICTIONS:  No lifting more than 25 pounds   FALLS:  Has patient fallen in last 6 months? No  LIVING ENVIRONMENT: Lives with: lives alone Lives in: House/apartment Stairs: Yes: External: 3 steps; on left going up Has following equipment at home: Single point cane and Walker - 2 wheeled, grab bars in shower   OCCUPATION: Engineer, drilling   PLOF: Independent  PATIENT GOALS: "Get back to walking without a walker or cane"   NEXT MD VISIT: 06/08/23 : prostate procedure   OBJECTIVE:  Note: Objective measures were completed at Evaluation unless otherwise noted.  DIAGNOSTIC FINDINGS:  IMPRESSION from MRI 03/26/23 1. Severe spinal canal narrowing at L2-L3 secondary to a combination of ligamentum flavum hypertrophy, short pedicles, and a disc bulge. 2. Severe left and moderate right neural foraminal narrowing at L5-S1.   PATIENT SURVEYS:  FOTO 05/29/23: 38  Typical result : 51   COGNITION: Overall cognitive status:  Within functional limits for tasks assessed     SENSATION: Light touch: WFL   LOWER EXTREMITY MMT:    MMT Right eval Left eval  Hip flexion 4-/5 4-/5  Hip extension    Hip abduction 4 4  Hip adduction 4- 4  Hip internal rotation    Hip external rotation    Knee flexion 4-/5 4-/5  Knee extension 4+ 4+  Ankle dorsiflexion 4/5 4/5  Ankle plantarflexion 4-/5 4-/5  Ankle inversion    Ankle eversion     (Blank rows = not tested)    FUNCTIONAL TESTS:  5 times sit to stand: 16.59s intermittent hand use 10 meter walk test: 20.97  .67m/s with walker    TODAY'S TREATMENT: DATE: 09/24/23  Unless otherwise stated, CGA was provided and gait belt donned in order to ensure pt safety TherAct: Sit to stand weighted chest raise 15x with 15lb KB 15lb KB farmer carry 2x each hand 60 ft  Heel raise 15x with decreasing UE support Weave between 6 cones without AD with focus on dual task, spatial awareness, and negotiation of obstacles.  6 cones in a row with SPC: lateral step and toe tap each cone for spatial awareness, single leg stance, full body strength  Opposite UE/LE raises 10x each LE  Activity Description: pods in circle, home in center, turn and hit Activity Setting:  The [facility] setting was selected for the goal of improving spatial awareness and visual scanning ability, establishing a central point of reference during dynamic movements for enhanced balance and control.   Number of Pods:  6 Cycles/Sets:  1 Duration (Time or Hit Count):  10    Neuro Re-ed:  ambulate in hallway: -horizontal head turns with cues for reading alphabet from cards 86 ftx 2 sets -horizontal head turns with cues for reading number and  symbols from cards 86 ft x 2 sets  -slow /fast ambulation based on PT guidance 86 ft x 2 sets  Single cone opposite LE tap no UE support  30 seconds SLS each LE   Pt requires therapeutic rest breaks throughout  PATIENT EDUCATION:  Education details:  HEP, POC, goals  Person educated: Patient Education method: Explanation, Demonstration, and Handouts Education comprehension: verbalized understanding and returned demonstration  HOME EXERCISE PROGRAM: Access Code: [email or web address] URL: [email or web address] Date: 05/29/2023 Prepared by: Precious Bard  Exercises - Standing March with Counter Support  - 1 x daily - 7 x weekly - 2 sets - 10 reps - 5 hold - Heel Raises with Counter Support  - 1 x daily - 7 x weekly - 2 sets - 10 reps - 5 hold - Sit to Stand Without Arm Support  - 1 x daily - 7 x weekly - 2 sets - 10 reps - 5 hold  ASSESSMENT:  CLINICAL IMPRESSION: Patient presents with excellent motivation throughout session. Single limb stability is challenging for patient especially in regards to dual task interventions requiring multimodal cueing. Patient improves with repetition initially but does have some eventual degradation with fatigue of LLE.  Pt will continue to benefit from skilled therapy to address remaining deficits in order to improve overall QoL and return to PLOF.      OBJECTIVE IMPAIRMENTS: Abnormal gait, decreased activity tolerance, decreased balance, decreased endurance, decreased mobility, difficulty walking, decreased strength, impaired flexibility, and improper body mechanics.   ACTIVITY LIMITATIONS: carrying, lifting, bending, standing, squatting, stairs, transfers, bed mobility, dressing, and locomotion level  PARTICIPATION LIMITATIONS: meal prep, cleaning, laundry, driving, shopping, occupation, and yard work  PERSONAL FACTORS: arthritis, congestive heart failure, CID, CDD, enlarged prostate, HTN, HLD, left inguinal hernia, type 2 diabetes are also affecting patient's functional outcome.   REHAB POTENTIAL: Good  CLINICAL DECISION MAKING: Evolving/moderate complexity  EVALUATION COMPLEXITY: Moderate   GOALS: Goals reviewed with patient? Yes  SHORT TERM GOALS: Target date: 08/24/2023    Patient  will demonstrates ability to ambulate for >258ft without AD with supervision assist to allow improved access to community  Goal status: initial    LONG TERM GOALS: Target date:11/14/2023    Patient will increase 10 meter walk test to >1.39m/s as to improve gait speed for better community ambulation and to reduce fall risk. Baseline: .47 m/s; 07/09/2023= 0.58 m/s with and without cane 07/26/23: 0.682m/s with [facility]  08/16/23: 0.74 m/s without SPC; 0.92 m/s with [facility] 1/29: 0.86 m/s without SPC  Goal status: PROGRESSING  2.  Patient will increase FOTO score to equal to or greater than 51 to demonstrate statistically significant improvement in mobility and quality of life. Baseline: 05/29/23: 38 07/09/2023= 49 07/26/23:47 08/16/23: 47 Goal status: PROGRESSING  3.  Patient (> 39 years old) will complete five times sit to stand test in < 15 seconds indicating an increased LE strength and improved balance. Baseline: 16.59 Intermittent hand use; 07/09/2023= 18.45 sec without UE support  07/26/23: 16.32 without UE Support  08/16/23: 11.70 without UE support Goal status: MET  4. Patient will ambulate 150 ft without the use of an assistive device in order to increase independence and improve quality of life.  Baseline: Not tested  07/26/23: able to ambulate >163ft with no AD and CGA only. Mild staggering R but no LOB  08/16/23: Pt able to ambulate 300' with no AD and CGA only.   goal status:MET  5.  Patient will increase Berg Balance score by >  6 points to demonstrate decreased fall risk during functional activities. Baseline: awaiting testing until precautions are lifted; 07/10/2023= 41/56 07/26/23: 43/56 08/16/23: 48/56 09/19/23: 49/56  Goal status: MET   6. Pt will improved DGI without AD by 4 points to indicate increased safety and reduced fall risk and improved community access.  Baseline: to be completed 08/20/23: 18/24 1/29: 19/24  Status : Ongoing   PLAN:  PT FREQUENCY:  1-2x/week  PT DURATION: 8 weeks  PLANNED INTERVENTIONS: Therapeutic exercises, Therapeutic activity, Neuromuscular re-education, Balance training, Gait training, Patient/Family education, Self Care, Joint mobilization, Stair training, Vestibular training, Canalith repositioning, DME instructions, Dry Needling, Electrical stimulation, Cryotherapy, Moist heat, scar mobilization, Taping, Manual therapy, and Re-evaluation.  PLAN FOR NEXT SESSION:  Coordination, balance and BLE strengthening.  Functional gait training. High level balance   Precious Bard PT   Physical Therapist - [facility]  09/24/23, 2:08 PM

## 2023-09-26 ENCOUNTER — Ambulatory Visit: Payer: 59

## 2023-09-26 NOTE — Telephone Encounter (Signed)
 Called patient per request.  He states since he was started on the new medication from his previous visit on 01/21 with Dr.Parker. He was changed from Carvedilol  to Metoprolol  50 mg twice daily, Mexiletine 200 mg twice daily. He states since starting on these medications he has been very fatigued and having issues with dry mouth. Patient has not checked his BP/HR since starting medications, I did advise for him to start checking this and notify us  of results. Patient aware I will make MD aware and will call back with any recommendations.

## 2023-09-27 ENCOUNTER — Ambulatory Visit: Payer: 59 | Admitting: Urology

## 2023-09-27 ENCOUNTER — Ambulatory Visit: Payer: 59

## 2023-09-28 ENCOUNTER — Other Ambulatory Visit: Payer: Self-pay

## 2023-09-28 MED ORDER — CARVEDILOL 6.25 MG PO TABS: 6.2500 mg | ORAL_TABLET | Freq: Two times a day (BID) | ORAL | 3 refills | Status: AC

## 2023-09-28 MED ORDER — MEXILETINE HCL 150 MG PO CAPS: 150.0000 mg | ORAL_CAPSULE | Freq: Two times a day (BID) | ORAL | 2 refills | Status: DC

## 2023-09-28 NOTE — Telephone Encounter (Signed)
 Called patient, explained medication adjustments.   Patient verbalized understanding.   RX sent to pharmacy: Mexiletine 150 mg twice daily, and previous dosage of Carvedilol  6.25 mg twice daily added back to medication list.

## 2023-10-01 ENCOUNTER — Ambulatory Visit: Payer: 59 | Admitting: Physical Therapy

## 2023-10-01 DIAGNOSIS — R2681 Unsteadiness on feet: Secondary | ICD-10-CM | POA: Diagnosis not present

## 2023-10-01 DIAGNOSIS — M6281 Muscle weakness (generalized): Secondary | ICD-10-CM

## 2023-10-01 DIAGNOSIS — R278 Other lack of coordination: Secondary | ICD-10-CM

## 2023-10-01 DIAGNOSIS — R262 Difficulty in walking, not elsewhere classified: Secondary | ICD-10-CM

## 2023-10-01 NOTE — Therapy (Signed)
 OUTPATIENT PHYSICAL THERAPY TREATMENT   Patient Name: Lawrence Holmes MRN: [id number] DOB:[birth year], 68 y.o., male Today's Date: 10/01/2023  END OF SESSION:  PT End of Session - 10/01/23 1306     Visit Number 29    Number of Visits 43    Date for PT Re-Evaluation 11/14/23    Authorization Type [employer]    Progress Note Due on Visit 30    PT Start Time 1317    PT Stop Time 1400    PT Time Calculation (min) 43 min    Equipment Utilized During Treatment Gait belt    Activity Tolerance Patient tolerated treatment well    Behavior During Therapy WFL for tasks assessed/performed                      Past Medical History:  Diagnosis Date   Aortic atherosclerosis (HCC)    Arthritis    BPH with urinary obstruction    Chronic systolic heart failure (HCC) 07/23/2017   a. TTE 07/23/2017: EF 30-35%, mild LVH, inferolateral and inferior AK, mild BAE, G1DD; b. TTE 10/23/2017: EF 40-45%, mild LVH, basal-mid inferior and inferoseptal AK, mild MR; c. TTE 11/19/2021: EF 45-50%, glob HK with mod to severe inferior and basal to mid septal HK, GLS -15.3%, mild MR, G1DD; d. 07/2023 Echo: EF 40-45%, inf/post HK, GrI DD, nl RV fxn.   Coronary artery disease 07/21/2017   a. 07/2017 Late presenting inferior STEMI/Cath: LM nl, LAD min irregs, LCX 100d (L->L collats), RCA 100p (L->R collats), EF 25-35%-->Med Rx; b. 10/2017 ETT (DOT): Ex time 7:38, baseline antlat TWI, no acute changes; c. 09/2019 ETT: Ex time 6:49. No ECG changes.   DDD (degenerative disc disease), lumbar    a.) s/p MIS L2-3 laminectomy and disectomy   Diverticulosis of colon    Erectile dysfunction    HTN (hypertension)    Hx of bilateral cataract extraction    Hyperlipidemia    Ischemic cardiomyopathy    a. LHC 07/21/2017: EF 25-35%; b. TTE 07/23/2017: 30-35%; c. TTE 10/23/2017: EF 40-45%; d. TTE 11/19/2021: EF 45-50%; e. 07/2023 Echo: EF 40-45%.   Left inguinal hernia    a.) s/p repair 05/19/2022   PVC's (premature  ventricular contractions)    a. 06/2023 72 hr Zio: Predominantly sinus rhythm at an average rate of 81 (57-1 33).  1 7 beat run of SVT at 133.  1.3% PAC burden.  14% PVC burden.   ST elevation myocardial infarction (STEMI) of inferior wall (HCC) 07/21/2017   a. LHC 07/21/2017: EF 25-35%, LVEDP 14 mmHg. 40% pLCx, 40% mLCx, 100% dLCx, 100% pRCA --> attempted to cross wire at RCA lesion, however unable. Reasonable RCA collaterals already formed --> med mgmt.   T2DM (type 2 diabetes mellitus) (HCC)    Tobacco use    Past Surgical History:  Procedure Laterality Date   CATARACT EXTRACTION Bilateral    COLONOSCOPY WITH PROPOFOL  N/A 04/04/2019   Procedure: COLONOSCOPY WITH PROPOFOL ;  Surgeon: Irby Mannan, MD;  Location: [facility];  Service: Endoscopy;  Laterality: N/A;   CYSTOSCOPY WITH FULGERATION N/A 05/14/2023   Procedure: CYSTOSCOPY WITH CLOT EVACUATION AND FULGERATION;  Surgeon: Dustin Gimenez, MD;  Location: [facility];  Service: Urology;  Laterality: N/A;   HOLEP-LASER ENUCLEATION OF THE PROSTATE WITH MORCELLATION N/A 06/08/2023   Procedure: HOLEP-LASER ENUCLEATION OF THE PROSTATE WITH MORCELLATION;  Surgeon: Lawerence Pressman, MD;  Location: [facility];  Service: Urology;  Laterality: N/A;   LEFT HEART CATH  AND CORONARY ANGIOGRAPHY N/A 07/21/2017   Procedure: LEFT HEART CATH AND CORONARY ANGIOGRAPHY;  Surgeon: Wenona Hamilton, MD;  Location: [facility];  Service: Cardiovascular;  Laterality: N/A;   LUMBAR LAMINECTOMY/DECOMPRESSION MICRODISCECTOMY Bilateral 04/05/2023   Procedure: MINIMALLY INVASIVE (MIS) L2-3 LAMINECTOMY AND DISCECTOMY;  Surgeon: Aurther Blue, MD;  Location: [facility];  Service: Neurosurgery;  Laterality: Bilateral;   XI ROBOTIC ASSISTED INGUINAL HERNIA REPAIR WITH MESH Left 05/19/2022   Procedure: XI ROBOTIC ASSISTED INGUINAL HERNIA REPAIR WITH MESH;  Surgeon: Flynn Hylan, MD;  Location: [facility];  Service: General;  Laterality: Left;   Patient  Active Problem List   Diagnosis Date Noted   S/P lumbar microdiscectomy 05/16/2023   Hematuria 05/12/2023   Acute urinary retention 05/12/2023   Bilateral hydronephrosis 05/12/2023   HTN (hypertension) 05/12/2023   HLD (hyperlipidemia) 05/12/2023   Type II diabetes mellitus with renal manifestations (HCC) 05/12/2023   Chronic kidney disease, stage 3a (HCC) 05/12/2023   Chronic systolic CHF (congestive heart failure) (HCC) 05/12/2023   BPH (benign prostatic hyperplasia) 05/12/2023   UTI (urinary tract infection) 05/12/2023   CAD (coronary artery disease) 05/12/2023   Intervertebral lumbar disc disorder with myelopathy, lumbar region 04/02/2023   Impaired functional mobility, balance, gait, and endurance 03/26/2023   Bilateral leg weakness 03/26/2023   Abnormal prostate exam 05/09/2022   Aortic atherosclerosis (HCC) 02/24/2022   Smokes with greater than 30 pack year history 11/18/2021   Coronary artery disease of native artery of native heart with stable angina pectoris (HCC) 05/19/2021   Dyslipidemia 07/30/2020   Polyp of colon    Type 2 diabetes mellitus, controlled (HCC) 02/08/2018   History of myocardial infarction 11/08/2017   Tobacco use 11/08/2017   Ischemic cardiomyopathy 11/08/2017   CHF (congestive heart failure) (HCC) 11/08/2017   Foot callus 11/08/2017   Onychomycosis of multiple toenails with type 2 diabetes mellitus (HCC) 11/08/2017   Cataract, nuclear sclerotic, left eye 04/04/2016    PCP: Adeline Hone, PA-C  REFERRING PROVIDER: Pearla Bottom Mecum, PA-C  REFERRING DIAG:  M51.06 (ICD-10-CM) - Intervertebral lumbar disc disorder with myelopathy, lumbar region  Z74.09 (ICD-10-CM) - Impaired functional mobility, balance, gait, and endurance  R29.898 (ICD-10-CM) - Bilateral leg weakness  Z98.890 (ICD-10-CM) - S/P lumbar microdiscectomy    Rationale for Evaluation and Treatment: Rehabilitation  THERAPY DIAG:  Unsteadiness on feet  Difficulty in walking, not elsewhere  classified  Muscle weakness (generalized)  Other lack of coordination  ONSET DATE: 03/2023  SUBJECTIVE:  SUBJECTIVE STATEMENT:  Reports doing good, had a "lazy" weekend, but otherwise no complaints. Pt denies any recent falls/stumbles since prior session. Pt denies any updates to medications or medical appointment since prior session.    PERTINENT HISTORY:  Approximately 6 weeks status post Right L2-L3 Laminectomy and microdiscectomy (04/05/23).  He states that he has graduated from his wheelchair and is almost only using his walker, trying to graduate to his cane.  Patient has a past medical history of arthritis, congestive heart failure, CID, CDD, enlarged prostate, HTN, HLD, left inguinal hernia, type 2 diabetes. 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    PAIN:  Are you having pain? No     PRECAUTIONS: Back   He can increase his weight to 25 pounds  RED FLAGS: None   WEIGHT BEARING RESTRICTIONS:  No lifting more than 25 pounds   FALLS:  Has patient fallen in last 6 months? No  LIVING ENVIRONMENT: Lives with: lives alone Lives in: House/apartment Stairs: Yes: External: 3 steps; on left going up Has following equipment at home: Single point cane and Walker - 2 wheeled, grab bars in shower   OCCUPATION: Engineer, drilling   PLOF: Independent  PATIENT GOALS: "Get back to walking without a walker or cane"   NEXT MD VISIT: 06/08/23 : prostate procedure   OBJECTIVE:  Note: Objective measures were completed at Evaluation unless otherwise noted.  DIAGNOSTIC FINDINGS:  IMPRESSION from MRI 03/26/23 1. Severe spinal canal narrowing at L2-L3 secondary to a combination of ligamentum flavum hypertrophy, short  pedicles, and a disc bulge. 2. Severe left and moderate right neural foraminal narrowing at L5-S1.   PATIENT SURVEYS:  FOTO 05/29/23: 38  Typical result : 51   COGNITION: Overall cognitive status: Within functional limits for tasks assessed     SENSATION: Light touch: WFL   LOWER EXTREMITY MMT:    MMT Right eval Left eval  Hip flexion 4-/5 4-/5  Hip extension    Hip abduction 4 4  Hip adduction 4- 4  Hip internal rotation    Hip external rotation    Knee flexion 4-/5 4-/5  Knee extension 4+ 4+  Ankle dorsiflexion 4/5 4/5  Ankle plantarflexion 4-/5 4-/5  Ankle inversion    Ankle eversion     (Blank rows = not tested)    FUNCTIONAL TESTS:  5 times sit to stand: 16.59s intermittent hand use 10 meter walk test: 20.97  .56m/s with walker    TODAY'S TREATMENT: DATE: 10/01/23  Pt arrived ambulating to clinic using SPC in R UE. Provided at minimum CGA using gait belt to ensure pt safety throughout session.   Dynamic gait training without AD including:  - fast forwards walking ~374ft - backwards walking ~23ft x2 - side stepping each direction 35ft ** Pt reports L side feels weaker than R  Noticed L LE has a "jerking movement" during swing when attempting to walk faster, possible co-contraction vs spasticity?   Dynamic gait training using agility ladder including the following:  - forward reciprocal pattern  - side stepping each direction - backwards step-to progressed to reciprocal pattern for final ~3 steps - forward 2 steps then turn 90 degrees R or L  *requires min A for balance during backwards gait and turning, again noticed L LE tends to have almost a "jerk/catch" when attempting to perform hip/knee flexion to pick-up and advance it during swing, but not consistent   Stepping up/down on/off 1st  6" step using B railings progressed to no UE support with light min A for balance - attempted standing bird/dogs with alternate arm raise holding 1lb dumbbell but pt  with difficulty motor planning so discontinued.   Stepping over 2 hurddles including:  - forward reciprocal pattern - side stepping - backwards step-to pattern *standing next to balance bar for safety  *Requires min A for balance during side and backwards stepping  Dynamic gait training 361ft focused on fast walking at increased intensity with CGA/light min A for steadying.   Pt requires therapeutic rest breaks throughout session.   PATIENT EDUCATION:  Education details: HEP, POC, goals  Person educated: Patient Education method: Explanation, Demonstration, and Handouts Education comprehension: verbalized understanding and returned demonstration  HOME EXERCISE PROGRAM: Access Code: [email or web address] URL: [email or web address] Date: 05/29/2023 Prepared by: Marina  Moser  Exercises - Standing March with Counter Support  - 1 x daily - 7 x weekly - 2 sets - 10 reps - 5 hold - Heel Raises with Counter Support  - 1 x daily - 7 x weekly - 2 sets - 10 reps - 5 hold - Sit to Stand Without Arm Support  - 1 x daily - 7 x weekly - 2 sets - 10 reps - 5 hold  ASSESSMENT:  CLINICAL IMPRESSION: Patient eager to participate in therapy session focused on continuing to advance his high level dynamic balance. Patient continues to demonstrate incoordination in B LEs (L>R) resulting in imbalance with dynamic stepping. Pt with increased challenge stepping leading with L LE and biases his support through R LE. Patient will benefit from continued dynamic stepping and dynamic gait challenges to further progress his balance and decrease his fall risk to allow increased and safe participation in community activities. Pt will continue to benefit from skilled therapy to address remaining deficits in order to improve overall QoL and return to PLOF.    OBJECTIVE IMPAIRMENTS: Abnormal gait, decreased activity tolerance, decreased balance, decreased endurance, decreased mobility, difficulty walking, decreased  strength, impaired flexibility, and improper body mechanics.   ACTIVITY LIMITATIONS: carrying, lifting, bending, standing, squatting, stairs, transfers, bed mobility, dressing, and locomotion level  PARTICIPATION LIMITATIONS: meal prep, cleaning, laundry, driving, shopping, occupation, and yard work  PERSONAL FACTORS: arthritis, congestive heart failure, CID, CDD, enlarged prostate, HTN, HLD, left inguinal hernia, type 2 diabetes are also affecting patient's functional outcome.   REHAB POTENTIAL: Good  CLINICAL DECISION MAKING: Evolving/moderate complexity  EVALUATION COMPLEXITY: Moderate   GOALS: Goals reviewed with patient? Yes  SHORT TERM GOALS: Target date: 08/24/2023    Patient will demonstrates ability to ambulate for >2102ft without AD with supervision assist to allow improved access to community  Goal status: initial    LONG TERM GOALS: Target date:11/14/2023    Patient will increase 10 meter walk test to >1.66m/s as to improve gait speed for better community ambulation and to reduce fall risk. Baseline: .47 m/s; 07/09/2023= 0.58 m/s with and without cane 07/26/23: 0.678m/s with [facility]  08/16/23: 0.74 m/s without SPC; 0.92 m/s with [facility] 1/29: 0.86 m/s without SPC  Goal status: PROGRESSING  2.  Patient will increase FOTO score to equal to or greater than 51 to demonstrate statistically significant improvement in mobility and quality of life. Baseline: 05/29/23: 38 07/09/2023= 49 07/26/23:47 08/16/23: 47 Goal status: PROGRESSING  3.  Patient (> 46 years old) will complete five times sit to stand test in < 15 seconds indicating an increased LE strength and improved balance. Baseline: 16.59 Intermittent hand use; 07/09/2023= 18.45 sec  without UE support  07/26/23: 16.32 without UE Support  08/16/23: 11.70 without UE support Goal status: MET  4. Patient will ambulate 150 ft without the use of an assistive device in order to increase independence and improve quality of life.   Baseline: Not tested  07/26/23: able to ambulate >128ft with no AD and CGA only. Mild staggering R but no LOB  08/16/23: Pt able to ambulate 300' with no AD and CGA only.   goal status:MET  5.  Patient will increase Berg Balance score by > 6 points to demonstrate decreased fall risk during functional activities. Baseline: awaiting testing until precautions are lifted; 07/10/2023= 41/56 07/26/23: 43/56 08/16/23: 48/56 09/19/23: 49/56  Goal status: MET   6. Pt will improved DGI without AD by 4 points to indicate increased safety and reduced fall risk and improved community access.  Baseline: to be completed 08/20/23: 18/24 1/29: 19/24  Status : Ongoing   PLAN:  PT FREQUENCY: 1-2x/week  PT DURATION: 8 weeks  PLANNED INTERVENTIONS: Therapeutic exercises, Therapeutic activity, Neuromuscular re-education, Balance training, Gait training, Patient/Family education, Self Care, Joint mobilization, Stair training, Vestibular training, Canalith repositioning, DME instructions, Dry Needling, Electrical stimulation, Cryotherapy, Moist heat, scar mobilization, Taping, Manual therapy, and Re-evaluation.  PLAN FOR NEXT SESSION:  Coordination, balance and BLE strengthening.  Functional gait training. High level balance - continued stepping over targets in variable directions and quick turning.   Rhett Cella, PT, DPT, NCS, CSRS Physical Therapist - [facility]  2:01 PM 10/01/23

## 2023-10-03 ENCOUNTER — Ambulatory Visit: Payer: 59

## 2023-10-04 ENCOUNTER — Ambulatory Visit: Payer: 59

## 2023-10-04 DIAGNOSIS — M6281 Muscle weakness (generalized): Secondary | ICD-10-CM

## 2023-10-04 DIAGNOSIS — R2681 Unsteadiness on feet: Secondary | ICD-10-CM

## 2023-10-04 DIAGNOSIS — R262 Difficulty in walking, not elsewhere classified: Secondary | ICD-10-CM

## 2023-10-04 NOTE — Therapy (Signed)
OUTPATIENT PHYSICAL THERAPY TREATMENT/ Physical Therapy Progress Note   Dates of reporting period  08/16/23   to   10/04/23      Patient Name: Lawrence Holmes [place] MRN: [id number] DOB:1956/03/26, 68 y.o., male Today's Date: 10/04/2023  END OF SESSION:  PT End of Session - 10/04/23 1404     Visit Number 30    Number of Visits 43    Date for PT Re-Evaluation 11/14/23    Authorization Type [employer]    Progress Note Due on Visit 30    PT Start Time 1403    PT Stop Time 1444    PT Time Calculation (min) 41 min    Equipment Utilized During Treatment Gait belt    Activity Tolerance Patient tolerated treatment well    Behavior During Therapy WFL for tasks assessed/performed                       Past Medical History:  Diagnosis Date   Aortic atherosclerosis (HCC)    Arthritis    BPH with urinary obstruction    Chronic systolic heart failure (HCC) 07/23/2017   a. TTE 07/23/2017: EF 30-35%, mild LVH, inferolateral and inferior AK, mild BAE, G1DD; b. TTE 10/23/2017: EF 40-45%, mild LVH, basal-mid inferior and inferoseptal AK, mild MR; c. TTE 11/19/2021: EF 45-50%, glob HK with mod to severe inferior and basal to mid septal HK, GLS -15.3%, mild MR, G1DD; Holmes. 07/2023 Echo: EF 40-45%, inf/post HK, GrI DD, nl RV fxn.   Coronary artery disease 07/21/2017   a. 07/2017 Late presenting inferior STEMI/Cath: LM nl, LAD min irregs, LCX 100d (L->L collats), RCA 100p (L->R collats), EF 25-35%-->Med Rx; b. 10/2017 ETT (DOT): Ex time 7:38, baseline antlat TWI, no acute changes; c. 09/2019 ETT: Ex time 6:49. No ECG changes.   DDD (degenerative disc disease), lumbar    a.) s/p MIS L2-3 laminectomy and disectomy   Diverticulosis of colon    Erectile dysfunction    HTN (hypertension)    Hx of bilateral cataract extraction    Hyperlipidemia    Ischemic cardiomyopathy    a. LHC 07/21/2017: EF 25-35%; b. TTE 07/23/2017: 30-35%; c. TTE 10/23/2017: EF 40-45%; Holmes. TTE 11/19/2021: EF 45-50%; e.  07/2023 Echo: EF 40-45%.   Left inguinal hernia    a.) s/p repair 05/19/2022   PVC's (premature ventricular contractions)    a. 06/2023 72 hr Zio: Predominantly sinus rhythm at an average rate of 81 (57-1 33).  1 7 beat run of SVT at 133.  1.3% PAC burden.  14% PVC burden.   ST elevation myocardial infarction (STEMI) of inferior wall (HCC) 07/21/2017   a. LHC 07/21/2017: EF 25-35%, LVEDP 14 mmHg. 40% pLCx, 40% mLCx, 100% dLCx, 100% pRCA --> attempted to cross wire at RCA lesion, however unable. Reasonable RCA collaterals already formed --> med mgmt.   T2DM (type 2 diabetes mellitus) (HCC)    Tobacco use    Past Surgical History:  Procedure Laterality Date   CATARACT EXTRACTION Bilateral    COLONOSCOPY WITH PROPOFOL N/A 04/04/2019   Procedure: COLONOSCOPY WITH PROPOFOL;  Surgeon: Pasty Spillers, MD;  Location: [facility];  Service: Endoscopy;  Laterality: N/A;   CYSTOSCOPY WITH FULGERATION N/A 05/14/2023   Procedure: CYSTOSCOPY WITH CLOT EVACUATION AND FULGERATION;  Surgeon: Vanna Scotland, MD;  Location: [facility];  Service: Urology;  Laterality: N/A;   HOLEP-LASER ENUCLEATION OF THE PROSTATE WITH MORCELLATION N/A 06/08/2023   Procedure: HOLEP-LASER ENUCLEATION OF THE PROSTATE WITH  MORCELLATION;  Surgeon: Sondra Come, MD;  Location: [facility];  Service: Urology;  Laterality: N/A;   LEFT HEART CATH AND CORONARY ANGIOGRAPHY N/A 07/21/2017   Procedure: LEFT HEART CATH AND CORONARY ANGIOGRAPHY;  Surgeon: Iran Ouch, MD;  Location: [facility];  Service: Cardiovascular;  Laterality: N/A;   LUMBAR LAMINECTOMY/DECOMPRESSION MICRODISCECTOMY Bilateral 04/05/2023   Procedure: MINIMALLY INVASIVE (MIS) L2-3 LAMINECTOMY AND DISCECTOMY;  Surgeon: Loreen Freud, MD;  Location: [facility];  Service: Neurosurgery;  Laterality: Bilateral;   XI ROBOTIC ASSISTED INGUINAL HERNIA REPAIR WITH MESH Left 05/19/2022   Procedure: XI ROBOTIC ASSISTED INGUINAL HERNIA REPAIR WITH MESH;   Surgeon: Campbell Lerner, MD;  Location: [facility];  Service: General;  Laterality: Left;   Patient Active Problem List   Diagnosis Date Noted   S/P lumbar microdiscectomy 05/16/2023   Hematuria 05/12/2023   Acute urinary retention 05/12/2023   Bilateral hydronephrosis 05/12/2023   HTN (hypertension) 05/12/2023   HLD (hyperlipidemia) 05/12/2023   Type II diabetes mellitus with renal manifestations (HCC) 05/12/2023   Chronic kidney disease, stage 3a (HCC) 05/12/2023   Chronic systolic CHF (congestive heart failure) (HCC) 05/12/2023   BPH (benign prostatic hyperplasia) 05/12/2023   UTI (urinary tract infection) 05/12/2023   CAD (coronary artery disease) 05/12/2023   Intervertebral lumbar disc disorder with myelopathy, lumbar region 04/02/2023   Impaired functional mobility, balance, gait, and endurance 03/26/2023   Bilateral leg weakness 03/26/2023   Abnormal prostate exam 05/09/2022   Aortic atherosclerosis (HCC) 02/24/2022   Smokes with greater than 30 pack year history 11/18/2021   Coronary artery disease of native artery of native heart with stable angina pectoris (HCC) 05/19/2021   Dyslipidemia 07/30/2020   Polyp of colon    Type 2 diabetes mellitus, controlled (HCC) 02/08/2018   History of myocardial infarction 11/08/2017   Tobacco use 11/08/2017   Ischemic cardiomyopathy 11/08/2017   CHF (congestive heart failure) (HCC) 11/08/2017   Foot callus 11/08/2017   Onychomycosis of multiple toenails with type 2 diabetes mellitus (HCC) 11/08/2017   Cataract, nuclear sclerotic, left eye 04/04/2016    PCP: Danelle Berry, PA-C  REFERRING PROVIDER: Oswaldo Conroy Mecum, PA-C  REFERRING DIAG:  M51.06 (ICD-10-CM) - Intervertebral lumbar disc disorder with myelopathy, lumbar region  Z74.09 (ICD-10-CM) - Impaired functional mobility, balance, gait, and endurance  R29.898 (ICD-10-CM) - Bilateral leg weakness  Z98.890 (ICD-10-CM) - S/P lumbar microdiscectomy    Rationale for Evaluation and  Treatment: Rehabilitation  THERAPY DIAG:  Unsteadiness on feet  Difficulty in walking, not elsewhere classified  Muscle weakness (generalized)  ONSET DATE: 03/2023  SUBJECTIVE:  SUBJECTIVE STATEMENT:  Patient reports feeling good after last session.    PERTINENT HISTORY:  Approximately 6 weeks status post Right L2-L3 Laminectomy and microdiscectomy (04/05/23).  He states that he has graduated from his wheelchair and is almost only using his walker, trying to graduate to his cane.  Patient has a past medical history of arthritis, congestive heart failure, CID, CDD, enlarged prostate, HTN, HLD, left inguinal hernia, type 2 diabetes. 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    PAIN:  Are you having pain? No     PRECAUTIONS: Back   He can increase his weight to 25 pounds  RED FLAGS: None   WEIGHT BEARING RESTRICTIONS:  No lifting more than 25 pounds   FALLS:  Has patient fallen in last 6 months? No  LIVING ENVIRONMENT: Lives with: lives alone Lives in: House/apartment Stairs: Yes: External: 3 steps; on left going up Has following equipment at home: Single point cane and Walker - 2 wheeled, grab bars in shower   OCCUPATION: Engineer, drilling   PLOF: Independent  PATIENT GOALS: "Get back to walking without a walker or cane"   NEXT MD VISIT: 06/08/23 : prostate procedure   OBJECTIVE:  Note: Objective measures were completed at Evaluation unless otherwise noted.  DIAGNOSTIC FINDINGS:  IMPRESSION from MRI 03/26/23 1. Severe spinal canal narrowing at L2-L3 secondary to a combination of ligamentum flavum hypertrophy, short pedicles, and a disc bulge. 2. Severe left and moderate right neural foraminal  narrowing at L5-S1.   PATIENT SURVEYS:  FOTO 05/29/23: 38  Typical result : 51   COGNITION: Overall cognitive status: Within functional limits for tasks assessed     SENSATION: Light touch: WFL   LOWER EXTREMITY MMT:    MMT Right eval Left eval  Hip flexion 4-/5 4-/5  Hip extension    Hip abduction 4 4  Hip adduction 4- 4  Hip internal rotation    Hip external rotation    Knee flexion 4-/5 4-/5  Knee extension 4+ 4+  Ankle dorsiflexion 4/5 4/5  Ankle plantarflexion 4-/5 4-/5  Ankle inversion    Ankle eversion     (Blank rows = not tested)    FUNCTIONAL TESTS:  5 times sit to stand: 16.59s intermittent hand use 10 meter walk test: 20.97  .57m/s with walker    TODAY'S TREATMENT: DATE: 10/04/23  Pt arrived ambulating to clinic using SPC in R UE. Provided at minimum CGA using gait belt to ensure pt safety throughout session.   ambulate in hallway: -horizontal head turns with cues for reading alphabet from cards 86 ftx 2 sets -horizontal head turns with cues for reading number and symbols from cards 86 ft x 2 sets  -"red light/green light" for sudden initiation/termination of ambulation with close CGA for carryover to natural environment 2x 86 ft   Ambulate with cone upside down with focus on stabilization with ambulation 150 ft   Cone taps with focus on not tipping cone over 15x each LE   Airex pad 4" step airex pad lateral step up/down 10x each side no UE support x  Airex pad 4" step airex pad forward/backward step with UE support (single arm) 15x each LE  Single leg squat 10x with UE support  Slider hamstring curl 15x   Hip IR/ER with slider 15x   Pt requires therapeutic rest breaks throughout session.   PATIENT EDUCATION:  Education details: HEP, POC, goals  Person educated: Patient Education  method: Explanation, Demonstration, and Handouts Education comprehension: verbalized understanding and returned demonstration  HOME EXERCISE  PROGRAM: Access Code: [email or web address] URL: [email or web address] Date: 05/29/2023 Prepared by: Precious Bard  Exercises - Standing March with Counter Support  - 1 x daily - 7 x weekly - 2 sets - 10 reps - 5 hold - Heel Raises with Counter Support  - 1 x daily - 7 x weekly - 2 sets - 10 reps - 5 hold - Sit to Stand Without Arm Support  - 1 x daily - 7 x weekly - 2 sets - 10 reps - 5 hold  ASSESSMENT:  CLINICAL IMPRESSION: Goals performed on 09/19/23, please refer to this note for further details. Patient's condition has the potential to improve in response to therapy. Maximum improvement is yet to be obtained. The anticipated improvement is attainable and reasonable in a generally predictable time. Patient is highly motivated throughout session and tolerates progressions throughout session.  Pt will continue to benefit from skilled therapy to address remaining deficits in order to improve overall QoL and return to PLOF.    OBJECTIVE IMPAIRMENTS: Abnormal gait, decreased activity tolerance, decreased balance, decreased endurance, decreased mobility, difficulty walking, decreased strength, impaired flexibility, and improper body mechanics.   ACTIVITY LIMITATIONS: carrying, lifting, bending, standing, squatting, stairs, transfers, bed mobility, dressing, and locomotion level  PARTICIPATION LIMITATIONS: meal prep, cleaning, laundry, driving, shopping, occupation, and yard work  PERSONAL FACTORS: arthritis, congestive heart failure, CID, CDD, enlarged prostate, HTN, HLD, left inguinal hernia, type 2 diabetes are also affecting patient's functional outcome.   REHAB POTENTIAL: Good  CLINICAL DECISION MAKING: Evolving/moderate complexity  EVALUATION COMPLEXITY: Moderate   GOALS: Goals reviewed with patient? Yes  SHORT TERM GOALS: Target date: 08/24/2023    Patient will demonstrates ability to ambulate for >259ft without AD with supervision assist to allow improved access to community   Goal status: initial    LONG TERM GOALS: Target date:11/14/2023    Patient will increase 10 meter walk test to >1.47m/s as to improve gait speed for better community ambulation and to reduce fall risk. Baseline: .47 m/s; 07/09/2023= 0.58 m/s with and without cane 07/26/23: 0.633m/s with [facility]  08/16/23: 0.74 m/s without SPC; 0.92 m/s with [facility] 1/29: 0.86 m/s without SPC  Goal status: PROGRESSING  2.  Patient will increase FOTO score to equal to or greater than 51 to demonstrate statistically significant improvement in mobility and quality of life. Baseline: 05/29/23: 38 07/09/2023= 49 07/26/23:47 08/16/23: 47 Goal status: PROGRESSING  3.  Patient (> 73 years old) will complete five times sit to stand test in < 15 seconds indicating an increased LE strength and improved balance. Baseline: 16.59 Intermittent hand use; 07/09/2023= 18.45 sec without UE support  07/26/23: 16.32 without UE Support  08/16/23: 11.70 without UE support Goal status: MET  4. Patient will ambulate 150 ft without the use of an assistive device in order to increase independence and improve quality of life.  Baseline: Not tested  07/26/23: able to ambulate >130ft with no AD and CGA only. Mild staggering R but no LOB  08/16/23: Pt able to ambulate 300' with no AD and CGA only.   goal status:MET  5.  Patient will increase Berg Balance score by > 6 points to demonstrate decreased fall risk during functional activities. Baseline: awaiting testing until precautions are lifted; 07/10/2023= 41/56 07/26/23: 43/56 08/16/23: 48/56 09/19/23: 49/56  Goal status: MET   6. Pt will improved DGI without AD by 4 points to indicate increased safety and  reduced fall risk and improved community access.  Baseline: to be completed 08/20/23: 18/24 1/29: 19/24  Status : Ongoing   PLAN:  PT FREQUENCY: 1-2x/week  PT DURATION: 8 weeks  PLANNED INTERVENTIONS: Therapeutic exercises, Therapeutic activity, Neuromuscular re-education,  Balance training, Gait training, Patient/Family education, Self Care, Joint mobilization, Stair training, Vestibular training, Canalith repositioning, DME instructions, Dry Needling, Electrical stimulation, Cryotherapy, Moist heat, scar mobilization, Taping, Manual therapy, and Re-evaluation.  PLAN FOR NEXT SESSION:  Coordination, balance and BLE strengthening.  Functional gait training. High level balance - continued stepping over targets in variable directions and quick turning.   Precious Bard, PT, DPT, NCS, CSRS Physical Therapist - [place]  [facility]  2:45 PM 10/04/23

## 2023-10-04 NOTE — Therapy (Signed)
OUTPATIENT PHYSICAL THERAPY TREATMENT      Patient Name: Lawrence Holmes MRN: [id number] DOB:May 06, 1956, 68 y.o., male Today's Date: 10/08/2023  END OF SESSION:  PT End of Session - 10/08/23 1311     Visit Number 31    Number of Visits 43    Date for PT Re-Evaluation 11/14/23    Authorization Type [employer]    Progress Note Due on Visit 30    PT Start Time 1314    PT Stop Time 1359    PT Time Calculation (min) 45 min    Equipment Utilized During Treatment Gait belt    Activity Tolerance Patient tolerated treatment well    Behavior During Therapy WFL for tasks assessed/performed                        Past Medical History:  Diagnosis Date   Aortic atherosclerosis (HCC)    Arthritis    BPH with urinary obstruction    Chronic systolic heart failure (HCC) 07/23/2017   a. TTE 07/23/2017: EF 30-35%, mild LVH, inferolateral and inferior AK, mild BAE, G1DD; b. TTE 10/23/2017: EF 40-45%, mild LVH, basal-mid inferior and inferoseptal AK, mild MR; c. TTE 11/19/2021: EF 45-50%, glob HK with mod to severe inferior and basal to mid septal HK, GLS -15.3%, mild MR, G1DD; d. 07/2023 Echo: EF 40-45%, inf/post HK, GrI DD, nl RV fxn.   Coronary artery disease 07/21/2017   a. 07/2017 Late presenting inferior STEMI/Cath: LM nl, LAD min irregs, LCX 100d (L->L collats), RCA 100p (L->R collats), EF 25-35%-->Med Rx; b. 10/2017 ETT (DOT): Ex time 7:38, baseline antlat TWI, no acute changes; c. 09/2019 ETT: Ex time 6:49. No ECG changes.   DDD (degenerative disc disease), lumbar    a.) s/p MIS L2-3 laminectomy and disectomy   Diverticulosis of colon    Erectile dysfunction    HTN (hypertension)    Hx of bilateral cataract extraction    Hyperlipidemia    Ischemic cardiomyopathy    a. LHC 07/21/2017: EF 25-35%; b. TTE 07/23/2017: 30-35%; c. TTE 10/23/2017: EF 40-45%; d. TTE 11/19/2021: EF 45-50%; e. 07/2023 Echo: EF 40-45%.   Left inguinal hernia    a.) s/p repair 05/19/2022   PVC's  (premature ventricular contractions)    a. 06/2023 72 hr Zio: Predominantly sinus rhythm at an average rate of 81 (57-1 33).  1 7 beat run of SVT at 133.  1.3% PAC burden.  14% PVC burden.   ST elevation myocardial infarction (STEMI) of inferior wall (HCC) 07/21/2017   a. LHC 07/21/2017: EF 25-35%, LVEDP 14 mmHg. 40% pLCx, 40% mLCx, 100% dLCx, 100% pRCA --> attempted to cross wire at RCA lesion, however unable. Reasonable RCA collaterals already formed --> med mgmt.   T2DM (type 2 diabetes mellitus) (HCC)    Tobacco use    Past Surgical History:  Procedure Laterality Date   CATARACT EXTRACTION Bilateral    COLONOSCOPY WITH PROPOFOL N/A 04/04/2019   Procedure: COLONOSCOPY WITH PROPOFOL;  Surgeon: Pasty Spillers, MD;  Location: [facility];  Service: Endoscopy;  Laterality: N/A;   CYSTOSCOPY WITH FULGERATION N/A 05/14/2023   Procedure: CYSTOSCOPY WITH CLOT EVACUATION AND FULGERATION;  Surgeon: Vanna Scotland, MD;  Location: [facility];  Service: Urology;  Laterality: N/A;   HOLEP-LASER ENUCLEATION OF THE PROSTATE WITH MORCELLATION N/A 06/08/2023   Procedure: HOLEP-LASER ENUCLEATION OF THE PROSTATE WITH MORCELLATION;  Surgeon: Sondra Come, MD;  Location: [facility];  Service: Urology;  Laterality: N/A;  LEFT HEART CATH AND CORONARY ANGIOGRAPHY N/A 07/21/2017   Procedure: LEFT HEART CATH AND CORONARY ANGIOGRAPHY;  Surgeon: Iran Ouch, MD;  Location: [facility];  Service: Cardiovascular;  Laterality: N/A;   LUMBAR LAMINECTOMY/DECOMPRESSION MICRODISCECTOMY Bilateral 04/05/2023   Procedure: MINIMALLY INVASIVE (MIS) L2-3 LAMINECTOMY AND DISCECTOMY;  Surgeon: Loreen Freud, MD;  Location: [facility];  Service: Neurosurgery;  Laterality: Bilateral;   XI ROBOTIC ASSISTED INGUINAL HERNIA REPAIR WITH MESH Left 05/19/2022   Procedure: XI ROBOTIC ASSISTED INGUINAL HERNIA REPAIR WITH MESH;  Surgeon: Campbell Lerner, MD;  Location: [facility];  Service: General;  Laterality: Left;    Patient Active Problem List   Diagnosis Date Noted   S/P lumbar microdiscectomy 05/16/2023   Hematuria 05/12/2023   Acute urinary retention 05/12/2023   Bilateral hydronephrosis 05/12/2023   HTN (hypertension) 05/12/2023   HLD (hyperlipidemia) 05/12/2023   Type II diabetes mellitus with renal manifestations (HCC) 05/12/2023   Chronic kidney disease, stage 3a (HCC) 05/12/2023   Chronic systolic CHF (congestive heart failure) (HCC) 05/12/2023   BPH (benign prostatic hyperplasia) 05/12/2023   UTI (urinary tract infection) 05/12/2023   CAD (coronary artery disease) 05/12/2023   Intervertebral lumbar disc disorder with myelopathy, lumbar region 04/02/2023   Impaired functional mobility, balance, gait, and endurance 03/26/2023   Bilateral leg weakness 03/26/2023   Abnormal prostate exam 05/09/2022   Aortic atherosclerosis (HCC) 02/24/2022   Smokes with greater than 30 pack year history 11/18/2021   Coronary artery disease of native artery of native heart with stable angina pectoris (HCC) 05/19/2021   Dyslipidemia 07/30/2020   Polyp of colon    Type 2 diabetes mellitus, controlled (HCC) 02/08/2018   History of myocardial infarction 11/08/2017   Tobacco use 11/08/2017   Ischemic cardiomyopathy 11/08/2017   CHF (congestive heart failure) (HCC) 11/08/2017   Foot callus 11/08/2017   Onychomycosis of multiple toenails with type 2 diabetes mellitus (HCC) 11/08/2017   Cataract, nuclear sclerotic, left eye 04/04/2016    PCP: Danelle Berry, PA-C  REFERRING PROVIDER: Oswaldo Conroy Mecum, PA-C  REFERRING DIAG:  M51.06 (ICD-10-CM) - Intervertebral lumbar disc disorder with myelopathy, lumbar region  Z74.09 (ICD-10-CM) - Impaired functional mobility, balance, gait, and endurance  R29.898 (ICD-10-CM) - Bilateral leg weakness  Z98.890 (ICD-10-CM) - S/P lumbar microdiscectomy    Rationale for Evaluation and Treatment: Rehabilitation  THERAPY DIAG:  Unsteadiness on feet  Difficulty in walking,  not elsewhere classified  Muscle weakness (generalized)  ONSET DATE: 03/2023  SUBJECTIVE:  SUBJECTIVE STATEMENT: Patient reports feeling pretty good.    PERTINENT HISTORY:  Approximately 6 weeks status post Right L2-L3 Laminectomy and microdiscectomy (04/05/23).  He states that he has graduated from his wheelchair and is almost only using his walker, trying to graduate to his cane.  Patient has a past medical history of arthritis, congestive heart failure, CID, CDD, enlarged prostate, HTN, HLD, left inguinal hernia, type 2 diabetes. 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    PAIN:  Are you having pain? No     PRECAUTIONS: Back   He can increase his weight to 25 pounds  RED FLAGS: None   WEIGHT BEARING RESTRICTIONS:  No lifting more than 25 pounds   FALLS:  Has patient fallen in last 6 months? No  LIVING ENVIRONMENT: Lives with: lives alone Lives in: House/apartment Stairs: Yes: External: 3 steps; on left going up Has following equipment at home: Single point cane and Walker - 2 wheeled, grab bars in shower   OCCUPATION: Engineer, drilling   PLOF: Independent  PATIENT GOALS: "Get back to walking without a walker or cane"   NEXT MD VISIT: 06/08/23 : prostate procedure   OBJECTIVE:  Note: Objective measures were completed at Evaluation unless otherwise noted.  DIAGNOSTIC FINDINGS:  IMPRESSION from MRI 03/26/23 1. Severe spinal canal narrowing at L2-L3 secondary to a combination of ligamentum flavum hypertrophy, short pedicles, and a disc bulge. 2. Severe left and moderate right neural foraminal narrowing at L5-S1.   PATIENT SURVEYS:  FOTO 05/29/23: 38  Typical result : 51   COGNITION: Overall  cognitive status: Within functional limits for tasks assessed     SENSATION: Light touch: WFL   LOWER EXTREMITY MMT:    MMT Right eval Left eval  Hip flexion 4-/5 4-/5  Hip extension    Hip abduction 4 4  Hip adduction 4- 4  Hip internal rotation    Hip external rotation    Knee flexion 4-/5 4-/5  Knee extension 4+ 4+  Ankle dorsiflexion 4/5 4/5  Ankle plantarflexion 4-/5 4-/5  Ankle inversion    Ankle eversion     (Blank rows = not tested)    FUNCTIONAL TESTS:  5 times sit to stand: 16.59s intermittent hand use 10 meter walk test: 20.97  .34m/s with walker    TODAY'S TREATMENT: DATE: 10/08/23  Pt arrived ambulating to clinic using SPC in R UE. Provided at minimum CGA using gait belt to ensure pt safety throughout session.   TherAct:  ambulate in hallway: -rainbow ball toss 150 ft x 2 trials -rainbow ball bounce 150 ft x 2 trials   Squats with bar support 10x Posterior lunge modified 10x each LE   Neuro Re-ed:  Airex pad:  Airex pad: toss ball onto wall x20 balls Airex pad Tandem stance sort letters by color, switch halfway for reach stabilization, spatial awareness, and scanning x multiple minutes  Airex pad: cone taps no UE support 10x each LE   TherEx;  GTB hamstring curl 15x each LE  GTB df press 15x  Pt requires therapeutic rest breaks throughout session.   PATIENT EDUCATION:  Education details: HEP, POC, goals  Person educated: Patient Education method: Explanation, Demonstration, and Handouts Education comprehension: verbalized understanding and returned demonstration  HOME EXERCISE PROGRAM: Access Code: [email or web address] URL: [email or web address] Date: 05/29/2023 Prepared by: Precious Bard  Exercises - Standing March with Counter Support  - 1 x daily - 7 x weekly - 2  sets - 10 reps - 5 hold - Heel Raises with Counter Support  - 1 x daily - 7 x weekly - 2 sets - 10 reps - 5 hold - Sit to Stand Without Arm Support  - 1 x daily - 7 x  weekly - 2 sets - 10 reps - 5 hold  ASSESSMENT:  CLINICAL IMPRESSION: Patient is progressing with dual task and functional mobility. He is highly motivated and is able to now transition from limb to limb without LOB with additional challenge of airex pad this session. Dual tasks are improving with decreased episodes of imbalance.  Pt will continue to benefit from skilled therapy to address remaining deficits in order to improve overall QoL and return to PLOF.    OBJECTIVE IMPAIRMENTS: Abnormal gait, decreased activity tolerance, decreased balance, decreased endurance, decreased mobility, difficulty walking, decreased strength, impaired flexibility, and improper body mechanics.   ACTIVITY LIMITATIONS: carrying, lifting, bending, standing, squatting, stairs, transfers, bed mobility, dressing, and locomotion level  PARTICIPATION LIMITATIONS: meal prep, cleaning, laundry, driving, shopping, occupation, and yard work  PERSONAL FACTORS: arthritis, congestive heart failure, CID, CDD, enlarged prostate, HTN, HLD, left inguinal hernia, type 2 diabetes are also affecting patient's functional outcome.   REHAB POTENTIAL: Good  CLINICAL DECISION MAKING: Evolving/moderate complexity  EVALUATION COMPLEXITY: Moderate   GOALS: Goals reviewed with patient? Yes  SHORT TERM GOALS: Target date: 08/24/2023    Patient will demonstrates ability to ambulate for >273ft without AD with supervision assist to allow improved access to community  Goal status: initial    LONG TERM GOALS: Target date:11/14/2023    Patient will increase 10 meter walk test to >1.37m/s as to improve gait speed for better community ambulation and to reduce fall risk. Baseline: .47 m/s; 07/09/2023= 0.58 m/s with and without cane 07/26/23: 0.623m/s with [facility]  08/16/23: 0.74 m/s without SPC; 0.92 m/s with [facility] 1/29: 0.86 m/s without SPC  Goal status: PROGRESSING  2.  Patient will increase FOTO score to equal to or greater than 51 to  demonstrate statistically significant improvement in mobility and quality of life. Baseline: 05/29/23: 38 07/09/2023= 49 07/26/23:47 08/16/23: 47 Goal status: PROGRESSING  3.  Patient (> 110 years old) will complete five times sit to stand test in < 15 seconds indicating an increased LE strength and improved balance. Baseline: 16.59 Intermittent hand use; 07/09/2023= 18.45 sec without UE support  07/26/23: 16.32 without UE Support  08/16/23: 11.70 without UE support Goal status: MET  4. Patient will ambulate 150 ft without the use of an assistive device in order to increase independence and improve quality of life.  Baseline: Not tested  07/26/23: able to ambulate >157ft with no AD and CGA only. Mild staggering R but no LOB  08/16/23: Pt able to ambulate 300' with no AD and CGA only.   goal status:MET  5.  Patient will increase Berg Balance score by > 6 points to demonstrate decreased fall risk during functional activities. Baseline: awaiting testing until precautions are lifted; 07/10/2023= 41/56 07/26/23: 43/56 08/16/23: 48/56 09/19/23: 49/56  Goal status: MET   6. Pt will improved DGI without AD by 4 points to indicate increased safety and reduced fall risk and improved community access.  Baseline: to be completed 08/20/23: 18/24 1/29: 19/24  Status : Ongoing   PLAN:  PT FREQUENCY: 1-2x/week  PT DURATION: 8 weeks  PLANNED INTERVENTIONS: Therapeutic exercises, Therapeutic activity, Neuromuscular re-education, Balance training, Gait training, Patient/Family education, Self Care, Joint mobilization, Stair training, Vestibular training, Canalith repositioning,  DME instructions, Dry Needling, Electrical stimulation, Cryotherapy, Moist heat, scar mobilization, Taping, Manual therapy, and Re-evaluation.  PLAN FOR NEXT SESSION:  Coordination, balance and BLE strengthening.  Functional gait training. High level balance - continued stepping over targets in variable directions and quick  turning.   Precious Bard, PT, DPT, NCS, CSRS Physical Therapist - [place]  [facility]  2:44 PM 10/08/23

## 2023-10-08 ENCOUNTER — Ambulatory Visit: Payer: 59 | Attending: Cardiology

## 2023-10-08 ENCOUNTER — Ambulatory Visit: Payer: 59

## 2023-10-08 ENCOUNTER — Other Ambulatory Visit: Payer: Self-pay

## 2023-10-08 DIAGNOSIS — R2681 Unsteadiness on feet: Secondary | ICD-10-CM

## 2023-10-08 DIAGNOSIS — M6281 Muscle weakness (generalized): Secondary | ICD-10-CM

## 2023-10-08 DIAGNOSIS — R262 Difficulty in walking, not elsewhere classified: Secondary | ICD-10-CM

## 2023-10-08 DIAGNOSIS — I493 Ventricular premature depolarization: Secondary | ICD-10-CM

## 2023-10-08 NOTE — Progress Notes (Unsigned)
 Enrolled for [employer] to mail a ZIO XT long term holter monitor to the patients address on file.

## 2023-10-10 ENCOUNTER — Ambulatory Visit: Payer: 59

## 2023-10-12 ENCOUNTER — Telehealth: Payer: Self-pay | Admitting: Neurosurgery

## 2023-10-12 NOTE — Telephone Encounter (Signed)
04/05/23  Right L2-L3 Laminectomy and microdiscectomy  Patient was last seen on 08/27/2023. He says he thinks Dr.Smith has released him from his care. He would like a letter releasing him back to work. He will pick it up Monday.

## 2023-10-15 NOTE — Therapy (Signed)
 OUTPATIENT PHYSICAL THERAPY TREATMENT      Patient Name: Lawrence Holmes MRN: [id number] DOB:[birth year], 68 y.o., male Today's Date: 10/16/2023  END OF SESSION:  PT End of Session - 10/16/23 0929     Visit Number 32    Number of Visits 43    Date for PT Re-Evaluation 11/14/23    Authorization Type [employer] Medicare    Progress Note Due on Visit 30    PT Start Time 0930    PT Stop Time 1002    PT Time Calculation (min) 32 min    Equipment Utilized During Treatment Gait belt    Activity Tolerance Patient tolerated treatment well    Behavior During Therapy WFL for tasks assessed/performed                         Past Medical History:  Diagnosis Date   Aortic atherosclerosis (HCC)    Arthritis    BPH with urinary obstruction    Chronic systolic heart failure (HCC) 07/23/2017   a. TTE 07/23/2017: EF 30-35%, mild LVH, inferolateral and inferior AK, mild BAE, G1DD; b. TTE 10/23/2017: EF 40-45%, mild LVH, basal-mid inferior and inferoseptal AK, mild MR; c. TTE 11/19/2021: EF 45-50%, glob HK with mod to severe inferior and basal to mid septal HK, GLS -15.3%, mild MR, G1DD; d. 07/2023 Echo: EF 40-45%, inf/post HK, GrI DD, nl RV fxn.   Coronary artery disease 07/21/2017   a. 07/2017 Late presenting inferior STEMI/Cath: LM nl, LAD min irregs, LCX 100d (L->L collats), RCA 100p (L->R collats), EF 25-35%-->Med Rx; b. 10/2017 ETT (DOT): Ex time 7:38, baseline antlat TWI, no acute changes; c. 09/2019 ETT: Ex time 6:49. No ECG changes.   DDD (degenerative disc disease), lumbar    a.) s/p MIS L2-3 laminectomy and disectomy   Diverticulosis of colon    Erectile dysfunction    HTN (hypertension)    Hx of bilateral cataract extraction    Hyperlipidemia    Ischemic cardiomyopathy    a. LHC 07/21/2017: EF 25-35%; b. TTE 07/23/2017: 30-35%; c. TTE 10/23/2017: EF 40-45%; d. TTE 11/19/2021: EF 45-50%; e. 07/2023 Echo: EF 40-45%.   Left inguinal hernia    a.) s/p repair 05/19/2022   PVC's  (premature ventricular contractions)    a. 06/2023 72 hr Zio: Predominantly sinus rhythm at an average rate of 81 (57-1 33).  1 7 beat run of SVT at 133.  1.3% PAC burden.  14% PVC burden.   ST elevation myocardial infarction (STEMI) of inferior wall (HCC) 07/21/2017   a. LHC 07/21/2017: EF 25-35%, LVEDP 14 mmHg. 40% pLCx, 40% mLCx, 100% dLCx, 100% pRCA --> attempted to cross wire at RCA lesion, however unable. Reasonable RCA collaterals already formed --> med mgmt.   T2DM (type 2 diabetes mellitus) (HCC)    Tobacco use    Past Surgical History:  Procedure Laterality Date   CATARACT EXTRACTION Bilateral    COLONOSCOPY WITH PROPOFOL N/A 04/04/2019   Procedure: COLONOSCOPY WITH PROPOFOL;  Surgeon: Pasty Spillers, MD;  Location: [facility];  Service: Endoscopy;  Laterality: N/A;   CYSTOSCOPY WITH FULGERATION N/A 05/14/2023   Procedure: CYSTOSCOPY WITH CLOT EVACUATION AND FULGERATION;  Surgeon: Vanna Scotland, MD;  Location: [facility];  Service: Urology;  Laterality: N/A;   HOLEP-LASER ENUCLEATION OF THE PROSTATE WITH MORCELLATION N/A 06/08/2023   Procedure: HOLEP-LASER ENUCLEATION OF THE PROSTATE WITH MORCELLATION;  Surgeon: Sondra Come, MD;  Location: [facility];  Service: Urology;  Laterality:  N/A;   LEFT HEART CATH AND CORONARY ANGIOGRAPHY N/A 07/21/2017   Procedure: LEFT HEART CATH AND CORONARY ANGIOGRAPHY;  Surgeon: Iran Ouch, MD;  Location: [facility];  Service: Cardiovascular;  Laterality: N/A;   LUMBAR LAMINECTOMY/DECOMPRESSION MICRODISCECTOMY Bilateral 04/05/2023   Procedure: MINIMALLY INVASIVE (MIS) L2-3 LAMINECTOMY AND DISCECTOMY;  Surgeon: Loreen Freud, MD;  Location: [facility];  Service: Neurosurgery;  Laterality: Bilateral;   XI ROBOTIC ASSISTED INGUINAL HERNIA REPAIR WITH MESH Left 05/19/2022   Procedure: XI ROBOTIC ASSISTED INGUINAL HERNIA REPAIR WITH MESH;  Surgeon: Campbell Lerner, MD;  Location: [facility];  Service: General;  Laterality: Left;    Patient Active Problem List   Diagnosis Date Noted   S/P lumbar microdiscectomy 05/16/2023   Hematuria 05/12/2023   Acute urinary retention 05/12/2023   Bilateral hydronephrosis 05/12/2023   HTN (hypertension) 05/12/2023   HLD (hyperlipidemia) 05/12/2023   Type II diabetes mellitus with renal manifestations (HCC) 05/12/2023   Chronic kidney disease, stage 3a (HCC) 05/12/2023   Chronic systolic CHF (congestive heart failure) (HCC) 05/12/2023   BPH (benign prostatic hyperplasia) 05/12/2023   UTI (urinary tract infection) 05/12/2023   CAD (coronary artery disease) 05/12/2023   Intervertebral lumbar disc disorder with myelopathy, lumbar region 04/02/2023   Impaired functional mobility, balance, gait, and endurance 03/26/2023   Bilateral leg weakness 03/26/2023   Abnormal prostate exam 05/09/2022   Aortic atherosclerosis (HCC) 02/24/2022   Smokes with greater than 30 pack year history 11/18/2021   Coronary artery disease of native artery of native heart with stable angina pectoris (HCC) 05/19/2021   Dyslipidemia 07/30/2020   Polyp of colon    Type 2 diabetes mellitus, controlled (HCC) 02/08/2018   History of myocardial infarction 11/08/2017   Tobacco use 11/08/2017   Ischemic cardiomyopathy 11/08/2017   CHF (congestive heart failure) (HCC) 11/08/2017   Foot callus 11/08/2017   Onychomycosis of multiple toenails with type 2 diabetes mellitus (HCC) 11/08/2017   Cataract, nuclear sclerotic, left eye 04/04/2016    PCP: Danelle Berry, PA-C  REFERRING PROVIDER: Oswaldo Conroy Mecum, PA-C  REFERRING DIAG:  M51.06 (ICD-10-CM) - Intervertebral lumbar disc disorder with myelopathy, lumbar region  Z74.09 (ICD-10-CM) - Impaired functional mobility, balance, gait, and endurance  R29.898 (ICD-10-CM) - Bilateral leg weakness  Z98.890 (ICD-10-CM) - S/P lumbar microdiscectomy    Rationale for Evaluation and Treatment: Rehabilitation  THERAPY DIAG:  Unsteadiness on feet  Difficulty in walking,  not elsewhere classified  Muscle weakness (generalized)  ONSET DATE: 03/2023  SUBJECTIVE:  SUBJECTIVE STATEMENT: Patient reports no stumbles. Is getting his return to work letter after his PT session. Took his DOT physical  Friday.    PERTINENT HISTORY:  Approximately 6 weeks status post Right L2-L3 Laminectomy and microdiscectomy (04/05/23).  He states that he has graduated from his wheelchair and is almost only using his walker, trying to graduate to his cane.  Patient has a past medical history of arthritis, congestive heart failure, CID, CDD, enlarged prostate, HTN, HLD, left inguinal hernia, type 2 diabetes. 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    PAIN:  Are you having pain? No     PRECAUTIONS: Back   He can increase his weight to 25 pounds  RED FLAGS: None   WEIGHT BEARING RESTRICTIONS:  No lifting more than 25 pounds   FALLS:  Has patient fallen in last 6 months? No  LIVING ENVIRONMENT: Lives with: lives alone Lives in: House/apartment Stairs: Yes: External: 3 steps; on left going up Has following equipment at home: Single point cane and Walker - 2 wheeled, grab bars in shower   OCCUPATION: Engineer, drilling   PLOF: Independent  PATIENT GOALS: "Get back to walking without a walker or cane"   NEXT MD VISIT: 06/08/23 : prostate procedure   OBJECTIVE:  Note: Objective measures were completed at Evaluation unless otherwise noted.  DIAGNOSTIC FINDINGS:  IMPRESSION from MRI 03/26/23 1. Severe spinal canal narrowing at L2-L3 secondary to a combination of ligamentum flavum hypertrophy, short pedicles, and a disc bulge. 2. Severe left and moderate right neural foraminal narrowing at L5-S1.    PATIENT SURVEYS:  FOTO 05/29/23: 38  Typical result : 51   COGNITION: Overall cognitive status: Within functional limits for tasks assessed     SENSATION: Light touch: WFL   LOWER EXTREMITY MMT:    MMT Right eval Left eval  Hip flexion 4-/5 4-/5  Hip extension    Hip abduction 4 4  Hip adduction 4- 4  Hip internal rotation    Hip external rotation    Knee flexion 4-/5 4-/5  Knee extension 4+ 4+  Ankle dorsiflexion 4/5 4/5  Ankle plantarflexion 4-/5 4-/5  Ankle inversion    Ankle eversion     (Blank rows = not tested)    FUNCTIONAL TESTS:  5 times sit to stand: 16.59s intermittent hand use 10 meter walk test: 20.97  .75m/s with walker    TODAY'S TREATMENT: DATE: 10/16/23  Pt arrived ambulating to clinic using SPC in R UE. Provided at minimum CGA using gait belt to ensure pt safety throughout session.   TherAct:  ambulate in hallway: -tennis ball toss to self 150 ft  -tennis ball toss from hand to hand 150 ft  Mini jump to wall 10x; very challenging.   15lb KB modified deadlift 10x; 2 sets  SLS 30 seconds each side; x 2 sets  Standing single leg raise 10x each LE; heavy UE support x2 sets   Squats with bar support 10x Posterior lunge modified 10x each LE    TherEx;  GTB hamstring curl 15x each LE  GTB df press 15x  Pt requires therapeutic rest breaks throughout session.   PATIENT EDUCATION:  Education details: HEP, POC, goals  Person educated: Patient Education method: Explanation, Demonstration, and Handouts Education comprehension: verbalized understanding and returned demonstration  HOME EXERCISE PROGRAM: Access Code: [email or web address] URL: [email or web address] Date: 05/29/2023 Prepared by: Precious Bard  Exercises - Standing March with Counter Support  -  1 x daily - 7 x weekly - 2 sets - 10 reps - 5 hold - Heel Raises with Counter Support  - 1 x daily - 7 x weekly - 2 sets - 10 reps - 5 hold - Sit to Stand Without Arm Support   - 1 x daily - 7 x weekly - 2 sets - 10 reps - 5 hold  ASSESSMENT:  CLINICAL IMPRESSION: Patient tolerates progressive stabilization and coordination tasks. He is fatigued by jumping this session.  He requires heavy UE support for single leg raise.  Pt will continue to benefit from skilled therapy to address remaining deficits in order to improve overall QoL and return to PLOF.    OBJECTIVE IMPAIRMENTS: Abnormal gait, decreased activity tolerance, decreased balance, decreased endurance, decreased mobility, difficulty walking, decreased strength, impaired flexibility, and improper body mechanics.   ACTIVITY LIMITATIONS: carrying, lifting, bending, standing, squatting, stairs, transfers, bed mobility, dressing, and locomotion level  PARTICIPATION LIMITATIONS: meal prep, cleaning, laundry, driving, shopping, occupation, and yard work  PERSONAL FACTORS: arthritis, congestive heart failure, CID, CDD, enlarged prostate, HTN, HLD, left inguinal hernia, type 2 diabetes are also affecting patient's functional outcome.   REHAB POTENTIAL: Good  CLINICAL DECISION MAKING: Evolving/moderate complexity  EVALUATION COMPLEXITY: Moderate   GOALS: Goals reviewed with patient? Yes  SHORT TERM GOALS: Target date: 08/24/2023    Patient will demonstrates ability to ambulate for >231ft without AD with supervision assist to allow improved access to community  Goal status: initial    LONG TERM GOALS: Target date:11/14/2023    Patient will increase 10 meter walk test to >1.47m/s as to improve gait speed for better community ambulation and to reduce fall risk. Baseline: .47 m/s; 07/09/2023= 0.58 m/s with and without cane 07/26/23: 0.649m/s with [facility]  08/16/23: 0.74 m/s without SPC; 0.92 m/s with [facility] 1/29: 0.86 m/s without SPC  Goal status: PROGRESSING  2.  Patient will increase FOTO score to equal to or greater than 51 to demonstrate statistically significant improvement in mobility and quality of  life. Baseline: 05/29/23: 38 07/09/2023= 49 07/26/23:47 08/16/23: 47 Goal status: PROGRESSING  3.  Patient (> 9 years old) will complete five times sit to stand test in < 15 seconds indicating an increased LE strength and improved balance. Baseline: 16.59 Intermittent hand use; 07/09/2023= 18.45 sec without UE support  07/26/23: 16.32 without UE Support  08/16/23: 11.70 without UE support Goal status: MET  4. Patient will ambulate 150 ft without the use of an assistive device in order to increase independence and improve quality of life.  Baseline: Not tested  07/26/23: able to ambulate >145ft with no AD and CGA only. Mild staggering R but no LOB  08/16/23: Pt able to ambulate 300' with no AD and CGA only.   goal status:MET  5.  Patient will increase Berg Balance score by > 6 points to demonstrate decreased fall risk during functional activities. Baseline: awaiting testing until precautions are lifted; 07/10/2023= 41/56 07/26/23: 43/56 08/16/23: 48/56 09/19/23: 49/56  Goal status: MET   6. Pt will improved DGI without AD by 4 points to indicate increased safety and reduced fall risk and improved community access.  Baseline: to be completed 08/20/23: 18/24 1/29: 19/24  Status : Ongoing   PLAN:  PT FREQUENCY: 1-2x/week  PT DURATION: 8 weeks  PLANNED INTERVENTIONS: Therapeutic exercises, Therapeutic activity, Neuromuscular re-education, Balance training, Gait training, Patient/Family education, Self Care, Joint mobilization, Stair training, Vestibular training, Canalith repositioning, DME instructions, Dry Needling, Electrical stimulation, Cryotherapy, Moist heat,  scar mobilization, Taping, Manual therapy, and Re-evaluation.  PLAN FOR NEXT SESSION:  Coordination, balance and BLE strengthening.  Functional gait training. High level balance - continued stepping over targets in variable directions and quick turning.   Precious Bard, PT, DPT, NCS, CSRS Physical Therapist - [facility]  6:25 PM 10/16/23

## 2023-10-16 ENCOUNTER — Ambulatory Visit: Payer: 59

## 2023-10-16 DIAGNOSIS — R2681 Unsteadiness on feet: Secondary | ICD-10-CM

## 2023-10-16 DIAGNOSIS — R262 Difficulty in walking, not elsewhere classified: Secondary | ICD-10-CM

## 2023-10-16 DIAGNOSIS — M6281 Muscle weakness (generalized): Secondary | ICD-10-CM

## 2023-10-18 ENCOUNTER — Ambulatory Visit (INDEPENDENT_AMBULATORY_CARE_PROVIDER_SITE_OTHER): Payer: 59 | Admitting: Urology

## 2023-10-18 VITALS — BP 103/65 | HR 98 | Ht 72.0 in | Wt 213.0 lb

## 2023-10-18 DIAGNOSIS — Z09 Encounter for follow-up examination after completed treatment for conditions other than malignant neoplasm: Secondary | ICD-10-CM | POA: Diagnosis not present

## 2023-10-18 DIAGNOSIS — Z87438 Personal history of other diseases of male genital organs: Secondary | ICD-10-CM | POA: Diagnosis not present

## 2023-10-18 DIAGNOSIS — N138 Other obstructive and reflux uropathy: Secondary | ICD-10-CM

## 2023-10-18 LAB — BLADDER SCAN AMB NON-IMAGING

## 2023-10-18 NOTE — Progress Notes (Signed)
   10/18/2023 3:32 PM   Mads D [place] 05-08-1956 [id number]  Reason for visit: Follow up BPH and retention status post HOLEP  HPI: 68 year old male with history of BPH and worsening urinary symptoms culminating in urinary retention, as well as gross hematuria related to BPH.  He ultimately underwent HOLEP on 06/08/2023 with removal of 30 g of benign prostate tissue.    He has been voiding spontaneously since that time.  He denies any incontinence or recurrent gross hematuria.  PVR today normal at .  Currently very satisfied with his urinary symptoms.  RTC 1 year PVR, can follow-up as needed at that point if doing well  Sondra Come, MD  [facility] [address] [place], [place] [address] [phone]

## 2023-10-21 DIAGNOSIS — I493 Ventricular premature depolarization: Secondary | ICD-10-CM | POA: Diagnosis not present

## 2023-10-23 ENCOUNTER — Ambulatory Visit: Payer: 59 | Admitting: Physician Assistant

## 2023-10-24 ENCOUNTER — Ambulatory Visit: Attending: Physician Assistant

## 2023-10-24 ENCOUNTER — Ambulatory Visit: Payer: 59 | Admitting: Family Medicine

## 2023-10-24 ENCOUNTER — Ambulatory Visit: Payer: 59

## 2023-10-24 ENCOUNTER — Other Ambulatory Visit: Payer: Self-pay | Admitting: Cardiology

## 2023-10-24 ENCOUNTER — Encounter: Payer: Self-pay | Admitting: Family Medicine

## 2023-10-24 VITALS — BP 118/70 | HR 65 | Resp 16 | Ht 72.0 in | Wt 213.0 lb

## 2023-10-24 DIAGNOSIS — M6281 Muscle weakness (generalized): Secondary | ICD-10-CM | POA: Insufficient documentation

## 2023-10-24 DIAGNOSIS — N401 Enlarged prostate with lower urinary tract symptoms: Secondary | ICD-10-CM

## 2023-10-24 DIAGNOSIS — R2681 Unsteadiness on feet: Secondary | ICD-10-CM | POA: Insufficient documentation

## 2023-10-24 DIAGNOSIS — E782 Mixed hyperlipidemia: Secondary | ICD-10-CM

## 2023-10-24 DIAGNOSIS — Z720 Tobacco use: Secondary | ICD-10-CM

## 2023-10-24 DIAGNOSIS — R278 Other lack of coordination: Secondary | ICD-10-CM | POA: Insufficient documentation

## 2023-10-24 DIAGNOSIS — I25118 Atherosclerotic heart disease of native coronary artery with other forms of angina pectoris: Secondary | ICD-10-CM | POA: Diagnosis not present

## 2023-10-24 DIAGNOSIS — I10 Essential (primary) hypertension: Secondary | ICD-10-CM | POA: Diagnosis not present

## 2023-10-24 DIAGNOSIS — R262 Difficulty in walking, not elsewhere classified: Secondary | ICD-10-CM | POA: Diagnosis present

## 2023-10-24 DIAGNOSIS — Z7409 Other reduced mobility: Secondary | ICD-10-CM

## 2023-10-24 DIAGNOSIS — Z716 Tobacco abuse counseling: Secondary | ICD-10-CM

## 2023-10-24 DIAGNOSIS — I509 Heart failure, unspecified: Secondary | ICD-10-CM | POA: Diagnosis not present

## 2023-10-24 DIAGNOSIS — Z9889 Other specified postprocedural states: Secondary | ICD-10-CM

## 2023-10-24 DIAGNOSIS — E119 Type 2 diabetes mellitus without complications: Secondary | ICD-10-CM | POA: Diagnosis not present

## 2023-10-24 DIAGNOSIS — M5106 Intervertebral disc disorders with myelopathy, lumbar region: Secondary | ICD-10-CM

## 2023-10-24 NOTE — Assessment & Plan Note (Signed)
 Recent urinary retention went to hospital had urology consult ultimately did HOLEP and his urinary sx have remained improved since that time (last sept) He is following with urology, Dr. Richardo Hanks

## 2023-10-24 NOTE — Assessment & Plan Note (Signed)
 Managed by cardiology, some recent testing and med changes

## 2023-10-24 NOTE — Therapy (Signed)
 OUTPATIENT PHYSICAL THERAPY TREATMENT      Patient Name: Lawrence Holmes [place] MRN: [id number] DOB:09-16-1955, 68 y.o., male Today's Date: 10/24/2023  END OF SESSION:  PT End of Session - 10/24/23 1059     Visit Number 33    Number of Visits 43    Date for PT Re-Evaluation 11/14/23    Authorization Type [employer] Medicare    Progress Note Due on Visit 30    PT Start Time 1100    PT Stop Time 1144    PT Time Calculation (min) 44 min    Equipment Utilized During Treatment Gait belt    Activity Tolerance Patient tolerated treatment well    Behavior During Therapy WFL for tasks assessed/performed                          Past Medical History:  Diagnosis Date   Aortic atherosclerosis (HCC)    Arthritis    BPH with urinary obstruction    Chronic systolic heart failure (HCC) 07/23/2017   a. TTE 07/23/2017: EF 30-35%, mild LVH, inferolateral and inferior AK, mild BAE, G1DD; b. TTE 10/23/2017: EF 40-45%, mild LVH, basal-mid inferior and inferoseptal AK, mild MR; c. TTE 11/19/2021: EF 45-50%, glob HK with mod to severe inferior and basal to mid septal HK, GLS -15.3%, mild MR, G1DD; Holmes. 07/2023 Echo: EF 40-45%, inf/post HK, GrI DD, nl RV fxn.   Coronary artery disease 07/21/2017   a. 07/2017 Late presenting inferior STEMI/Cath: LM nl, LAD min irregs, LCX 100d (L->L collats), RCA 100p (L->R collats), EF 25-35%-->Med Rx; b. 10/2017 ETT (DOT): Ex time 7:38, baseline antlat TWI, no acute changes; c. 09/2019 ETT: Ex time 6:49. No ECG changes.   DDD (degenerative disc disease), lumbar    a.) s/p MIS L2-3 laminectomy and disectomy   Diverticulosis of colon    Erectile dysfunction    HTN (hypertension)    Hx of bilateral cataract extraction    Hyperlipidemia    Ischemic cardiomyopathy    a. LHC 07/21/2017: EF 25-35%; b. TTE 07/23/2017: 30-35%; c. TTE 10/23/2017: EF 40-45%; Holmes. TTE 11/19/2021: EF 45-50%; e. 07/2023 Echo: EF 40-45%.   Left inguinal hernia    a.) s/p repair 05/19/2022   PVC's  (premature ventricular contractions)    a. 06/2023 72 hr Zio: Predominantly sinus rhythm at an average rate of 81 (57-1 33).  1 7 beat run of SVT at 133.  1.3% PAC burden.  14% PVC burden.   ST elevation myocardial infarction (STEMI) of inferior wall (HCC) 07/21/2017   a. LHC 07/21/2017: EF 25-35%, LVEDP 14 mmHg. 40% pLCx, 40% mLCx, 100% dLCx, 100% pRCA --> attempted to cross wire at RCA lesion, however unable. Reasonable RCA collaterals already formed --> med mgmt.   T2DM (type 2 diabetes mellitus) (HCC)    Tobacco use    Past Surgical History:  Procedure Laterality Date   CATARACT EXTRACTION Bilateral    COLONOSCOPY WITH PROPOFOL N/A 04/04/2019   Procedure: COLONOSCOPY WITH PROPOFOL;  Surgeon: Pasty Spillers, MD;  Location: [facility];  Service: Endoscopy;  Laterality: N/A;   CYSTOSCOPY WITH FULGERATION N/A 05/14/2023   Procedure: CYSTOSCOPY WITH CLOT EVACUATION AND FULGERATION;  Surgeon: Vanna Scotland, MD;  Location: [facility];  Service: Urology;  Laterality: N/A;   HOLEP-LASER ENUCLEATION OF THE PROSTATE WITH MORCELLATION N/A 06/08/2023   Procedure: HOLEP-LASER ENUCLEATION OF THE PROSTATE WITH MORCELLATION;  Surgeon: Sondra Come, MD;  Location: [facility];  Service: Urology;  Laterality: N/A;   LEFT HEART CATH AND CORONARY ANGIOGRAPHY N/A 07/21/2017   Procedure: LEFT HEART CATH AND CORONARY ANGIOGRAPHY;  Surgeon: Iran Ouch, MD;  Location: [facility];  Service: Cardiovascular;  Laterality: N/A;   LUMBAR LAMINECTOMY/DECOMPRESSION MICRODISCECTOMY Bilateral 04/05/2023   Procedure: MINIMALLY INVASIVE (MIS) L2-3 LAMINECTOMY AND DISCECTOMY;  Surgeon: Loreen Freud, MD;  Location: [facility];  Service: Neurosurgery;  Laterality: Bilateral;   XI ROBOTIC ASSISTED INGUINAL HERNIA REPAIR WITH MESH Left 05/19/2022   Procedure: XI ROBOTIC ASSISTED INGUINAL HERNIA REPAIR WITH MESH;  Surgeon: Campbell Lerner, MD;  Location: [facility];  Service: General;  Laterality: Left;    Patient Active Problem List   Diagnosis Date Noted   S/P lumbar microdiscectomy 05/16/2023   Hematuria 05/12/2023   Acute urinary retention 05/12/2023   Bilateral hydronephrosis 05/12/2023   HTN (hypertension) 05/12/2023   HLD (hyperlipidemia) 05/12/2023   Type II diabetes mellitus with renal manifestations (HCC) 05/12/2023   Chronic kidney disease, stage 3a (HCC) 05/12/2023   Chronic systolic CHF (congestive heart failure) (HCC) 05/12/2023   BPH (benign prostatic hyperplasia) 05/12/2023   UTI (urinary tract infection) 05/12/2023   CAD (coronary artery disease) 05/12/2023   Intervertebral lumbar disc disorder with myelopathy, lumbar region 04/02/2023   Impaired functional mobility, balance, gait, and endurance 03/26/2023   Bilateral leg weakness 03/26/2023   Abnormal prostate exam 05/09/2022   Aortic atherosclerosis (HCC) 02/24/2022   Smokes with greater than 30 pack year history 11/18/2021   Coronary artery disease of native artery of native heart with stable angina pectoris (HCC) 05/19/2021   Dyslipidemia 07/30/2020   Polyp of colon    Type 2 diabetes mellitus, controlled (HCC) 02/08/2018   History of myocardial infarction 11/08/2017   Tobacco use 11/08/2017   Ischemic cardiomyopathy 11/08/2017   CHF (congestive heart failure) (HCC) 11/08/2017   Foot callus 11/08/2017   Onychomycosis of multiple toenails with type 2 diabetes mellitus (HCC) 11/08/2017   Cataract, nuclear sclerotic, left eye 04/04/2016    PCP: Danelle Berry, PA-C  REFERRING PROVIDER: Oswaldo Conroy Mecum, PA-C  REFERRING DIAG:  M51.06 (ICD-10-CM) - Intervertebral lumbar disc disorder with myelopathy, lumbar region  Z74.09 (ICD-10-CM) - Impaired functional mobility, balance, gait, and endurance  R29.898 (ICD-10-CM) - Bilateral leg weakness  Z98.890 (ICD-10-CM) - S/P lumbar microdiscectomy    Rationale for Evaluation and Treatment: Rehabilitation  THERAPY DIAG:  Unsteadiness on feet  Difficulty in walking,  not elsewhere classified  Muscle weakness (generalized)  ONSET DATE: 03/2023  SUBJECTIVE:  SUBJECTIVE STATEMENT: Patient goes back to work the 17th, will have one more PT session then discharge.    PERTINENT HISTORY:  Approximately 6 weeks status post Right L2-L3 Laminectomy and microdiscectomy (04/05/23).  He states that he has graduated from his wheelchair and is almost only using his walker, trying to graduate to his cane.  Patient has a past medical history of arthritis, congestive heart failure, CID, CDD, enlarged prostate, HTN, HLD, left inguinal hernia, type 2 diabetes. 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    PAIN:  Are you having pain? No     PRECAUTIONS: Back   He can increase his weight to 25 pounds  RED FLAGS: None   WEIGHT BEARING RESTRICTIONS:  No lifting more than 25 pounds   FALLS:  Has patient fallen in last 6 months? No  LIVING ENVIRONMENT: Lives with: lives alone Lives in: House/apartment Stairs: Yes: External: 3 steps; on left going up Has following equipment at home: Single point cane and Walker - 2 wheeled, grab bars in shower   OCCUPATION: Engineer, drilling   PLOF: Independent  PATIENT GOALS: "Get back to walking without a walker or cane"   NEXT MD VISIT: 06/08/23 : prostate procedure   OBJECTIVE:  Note: Objective measures were completed at Evaluation unless otherwise noted.  DIAGNOSTIC FINDINGS:  IMPRESSION from MRI 03/26/23 1. Severe spinal canal narrowing at L2-L3 secondary to a combination of ligamentum flavum hypertrophy, short pedicles, and a disc bulge. 2. Severe left and moderate right neural foraminal narrowing at L5-S1.   PATIENT SURVEYS:  FOTO 05/29/23: 38   Typical result : 51   COGNITION: Overall cognitive status: Within functional limits for tasks assessed     SENSATION: Light touch: WFL   LOWER EXTREMITY MMT:    MMT Right eval Left eval  Hip flexion 4-/5 4-/5  Hip extension    Hip abduction 4 4  Hip adduction 4- 4  Hip internal rotation    Hip external rotation    Knee flexion 4-/5 4-/5  Knee extension 4+ 4+  Ankle dorsiflexion 4/5 4/5  Ankle plantarflexion 4-/5 4-/5  Ankle inversion    Ankle eversion     (Blank rows = not tested)    FUNCTIONAL TESTS:  5 times sit to stand: 16.59s intermittent hand use 10 meter walk test: 20.97  .10m/s with walker    TODAY'S TREATMENT: DATE: 10/24/23    TherAct:  Nustep Lvl 5 seated positing 10x, 5 minutes  Orange hurdle: -lateral step no UE support 10x; each side -forward step/backward 10x each LE  4" step heel drop and raise 10x; 2 sets  Heel raise to tip toe to touch wall 10x; 2 sets very challenging.   SLS 30 seconds each side; x 2 sets   TherEx;  GTB hamstring curl 15x each LE  GTB df press 15x; 2 sets  Pt requires therapeutic rest breaks throughout session.   PATIENT EDUCATION:  Education details: HEP, POC, goals  Person educated: Patient Education method: Explanation, Demonstration, and Handouts Education comprehension: verbalized understanding and returned demonstration  HOME EXERCISE PROGRAM: Access Code: [email or web address] URL: [email or web address] Date: 05/29/2023 Prepared by: Precious Bard  Exercises - Standing March with Counter Support  - 1 x daily - 7 x weekly - 2 sets - 10 reps - 5 hold - Heel Raises with Counter Support  - 1 x daily - 7 x weekly - 2 sets - 10 reps - 5 hold -  Sit to Stand Without Arm Support  - 1 x daily - 7 x weekly - 2 sets - 10 reps - 5 hold  ASSESSMENT:  CLINICAL IMPRESSION: Patient is challenged with heel raises at the wall.  Patient is highly motivated throughout session. He returns to work on the 17th , will  have one more appt before returning to work.  Pt will continue to benefit from skilled therapy to address remaining deficits in order to improve overall QoL and return to PLOF.    OBJECTIVE IMPAIRMENTS: Abnormal gait, decreased activity tolerance, decreased balance, decreased endurance, decreased mobility, difficulty walking, decreased strength, impaired flexibility, and improper body mechanics.   ACTIVITY LIMITATIONS: carrying, lifting, bending, standing, squatting, stairs, transfers, bed mobility, dressing, and locomotion level  PARTICIPATION LIMITATIONS: meal prep, cleaning, laundry, driving, shopping, occupation, and yard work  PERSONAL FACTORS: arthritis, congestive heart failure, CID, CDD, enlarged prostate, HTN, HLD, left inguinal hernia, type 2 diabetes are also affecting patient's functional outcome.   REHAB POTENTIAL: Good  CLINICAL DECISION MAKING: Evolving/moderate complexity  EVALUATION COMPLEXITY: Moderate   GOALS: Goals reviewed with patient? Yes  SHORT TERM GOALS: Target date: 08/24/2023    Patient will demonstrates ability to ambulate for >266ft without AD with supervision assist to allow improved access to community  Goal status: initial    LONG TERM GOALS: Target date:11/14/2023    Patient will increase 10 meter walk test to >1.87m/s as to improve gait speed for better community ambulation and to reduce fall risk. Baseline: .47 m/s; 07/09/2023= 0.58 m/s with and without cane 07/26/23: 0.650m/s with [facility]  08/16/23: 0.74 m/s without SPC; 0.92 m/s with [facility] 1/29: 0.86 m/s without SPC  Goal status: PROGRESSING  2.  Patient will increase FOTO score to equal to or greater than 51 to demonstrate statistically significant improvement in mobility and quality of life. Baseline: 05/29/23: 38 07/09/2023= 49 07/26/23:47 08/16/23: 47 Goal status: PROGRESSING  3.  Patient (> 91 years old) will complete five times sit to stand test in < 15 seconds indicating an increased LE  strength and improved balance. Baseline: 16.59 Intermittent hand use; 07/09/2023= 18.45 sec without UE support  07/26/23: 16.32 without UE Support  08/16/23: 11.70 without UE support Goal status: MET  4. Patient will ambulate 150 ft without the use of an assistive device in order to increase independence and improve quality of life.  Baseline: Not tested  07/26/23: able to ambulate >176ft with no AD and CGA only. Mild staggering R but no LOB  08/16/23: Pt able to ambulate 300' with no AD and CGA only.   goal status:MET  5.  Patient will increase Berg Balance score by > 6 points to demonstrate decreased fall risk during functional activities. Baseline: awaiting testing until precautions are lifted; 07/10/2023= 41/56 07/26/23: 43/56 08/16/23: 48/56 09/19/23: 49/56  Goal status: MET   6. Pt will improved DGI without AD by 4 points to indicate increased safety and reduced fall risk and improved community access.  Baseline: to be completed 08/20/23: 18/24 1/29: 19/24  Status : Ongoing   PLAN:  PT FREQUENCY: 1-2x/week  PT DURATION: 8 weeks  PLANNED INTERVENTIONS: Therapeutic exercises, Therapeutic activity, Neuromuscular re-education, Balance training, Gait training, Patient/Family education, Self Care, Joint mobilization, Stair training, Vestibular training, Canalith repositioning, DME instructions, Dry Needling, Electrical stimulation, Cryotherapy, Moist heat, scar mobilization, Taping, Manual therapy, and Re-evaluation.  PLAN FOR NEXT SESSION:  Coordination, balance and BLE strengthening.  Functional gait training. High level balance - continued stepping over targets in variable  directions and quick turning.   Precious Bard, PT, DPT, NCS, CSRS Physical Therapist - [facility]  [facility]  11:46 AM 10/24/23

## 2023-10-24 NOTE — Assessment & Plan Note (Signed)
 He is completing his physical therapy and has improving mobility gait balance and endurance he is still using a cane

## 2023-10-24 NOTE — Assessment & Plan Note (Signed)
 Managed by cardiology, pt appears euvolemic today, no concerning signs or sx

## 2023-10-24 NOTE — Assessment & Plan Note (Signed)
 Managing with neurosurgery and physical therapy

## 2023-10-24 NOTE — Assessment & Plan Note (Signed)
 Per neurosurgery, improving sx since surgery about to return to work and complete PT

## 2023-10-24 NOTE — Assessment & Plan Note (Signed)
 Currently 3/4 ppd hoping to cut back or quit

## 2023-10-24 NOTE — Assessment & Plan Note (Signed)
 Lab Results  Component Value Date   HGBA1C 6.2 (H) 07/24/2023  Well controlled with A1C at goal with metformin and jardiance Continue meds and diet/lifestyle efforts Labs not needed today will do at next OV

## 2023-10-24 NOTE — Assessment & Plan Note (Signed)
 Lipids well controlled with atorvastatin 80 mg daily (cardiology may want goal less than 55) Compliant with meds, no SE, no myalgias, fatigue or jaundice Lab Results  Component Value Date   CHOL 113 07/24/2023   HDL 25 (L) 07/24/2023   LDLCALC 68 07/24/2023   TRIG 122 07/24/2023   CHOLHDL 4.5 07/24/2023

## 2023-10-24 NOTE — Assessment & Plan Note (Signed)
 Blood pressure at goal today and well-controlled with current medications mostly prescribed and managed by cardiology including carvedilol, Entresto, spironolactone BP Readings from Last 3 Encounters:  10/24/23 118/70  10/18/23 103/65  09/11/23 (!) 140/67

## 2023-10-24 NOTE — Progress Notes (Signed)
 Name: Lawrence Holmes   MRN: [id number]    DOB: 07/25/1956   Date:10/24/2023       Progress Note  Chief Complaint  Patient presents with   Medical Management of Chronic Issues     Subjective:   Lawrence Holmes is a 68 y.o. male, presents to clinic for routine follow up on chronic conditions  Pt is nearing the end of the his rehab following low back surgery and hoping to go back to work in the next 2 weeks. Balance gait and strength is all improving he is sometimes able to go without his cane  DM well controlled on jardiance no urinary concerns or sx, and metformin, good compliance, not checking sugars Lab Results  Component Value Date   HGBA1C 6.2 (H) 07/24/2023   Currently smoking 3/4 ppd He was prescribed chantix by cardiology hasn't startedit yet is considering it for when going back to work  HTN and CHF - managed by cardiology several med changes recently with some palpitations  HLD recently check 3 months ago, still on lipitor 80 no changes or concerns     Current Outpatient Medications:    ACCU-CHEK GUIDE test strip, SMARTSIG:Via Meter 1-4 Times Daily, Disp: , Rfl:    Accu-Chek Softclix Lancets lancets, SMARTSIG:Via Meter 1-4 Times Daily, Disp: , Rfl:    aspirin 81 MG chewable tablet, Chew 1 tablet (81 mg total) by mouth daily., Disp: 30 tablet, Rfl: 2   atorvastatin (LIPITOR) 80 MG tablet, Take 1 tablet (80 mg total) by mouth at bedtime., Disp: 90 tablet, Rfl: 3   Blood Gluc Meter Disp-Strips (BLOOD GLUCOSE METER DISPOSABLE) DEVI, Check fingerstick blood sugars once a day; E11.69, LON 99 months, Disp: 100 each, Rfl: 1   blood glucose meter kit and supplies KIT, Dispense based on patient and insurance preference. Use up to four times daily as directed. (FOR ICD-9 250.00, 250.01)., Disp: 1 each, Rfl: 0   carvedilol (COREG) 6.25 MG tablet, Take 1 tablet (6.25 mg total) by mouth 2 (two) times daily., Disp: 180 tablet, Rfl: 3   empagliflozin (JARDIANCE) 10 MG TABS tablet, Take 1  tablet (10 mg total) by mouth daily before breakfast., Disp: 90 tablet, Rfl: 1   metFORMIN (GLUCOPHAGE) 500 MG tablet, Take 1 tablet (500 mg total) by mouth 2 (two) times daily with a meal., Disp: 180 tablet, Rfl: 3   mexiletine (MEXITIL) 150 MG capsule, Take 1 capsule (150 mg total) by mouth 2 (two) times daily., Disp: 60 capsule, Rfl: 2   sacubitril-valsartan (ENTRESTO) 24-26 MG, TAKE 1 TABLET BY MOUTH TWICE DAILY, Disp: 60 tablet, Rfl: 6   spironolactone (ALDACTONE) 25 MG tablet, TAKE 1 TABLET(25 MG) BY MOUTH DAILY, Disp: 90 tablet, Rfl: 3   varenicline (CHANTIX CONTINUING MONTH PAK) 1 MG tablet, Take 1 tablet (1 mg total) by mouth 2 (two) times daily., Disp: 60 tablet, Rfl: 1   Varenicline Tartrate, Starter, (CHANTIX STARTING MONTH PAK) 0.5 MG X 11 & 1 MG X 42 TBPK, Take one 0.5 mg tablet by mouth once daily for 3 days, then increase to one 0.5 mg tablet twice daily for 4 days, then increase to one 1 mg tablet twice daily., Disp: 53 each, Rfl: 0  Patient Active Problem List   Diagnosis Date Noted   S/P lumbar microdiscectomy 05/16/2023   Hematuria 05/12/2023   Acute urinary retention 05/12/2023   Bilateral hydronephrosis 05/12/2023   HTN (hypertension) 05/12/2023   HLD (hyperlipidemia) 05/12/2023   Type II diabetes mellitus with  renal manifestations (HCC) 05/12/2023   Chronic kidney disease, stage 3a (HCC) 05/12/2023   Chronic systolic CHF (congestive heart failure) (HCC) 05/12/2023   BPH (benign prostatic hyperplasia) 05/12/2023   UTI (urinary tract infection) 05/12/2023   CAD (coronary artery disease) 05/12/2023   Intervertebral lumbar disc disorder with myelopathy, lumbar region 04/02/2023   Impaired functional mobility, balance, gait, and endurance 03/26/2023   Bilateral leg weakness 03/26/2023   Abnormal prostate exam 05/09/2022   Aortic atherosclerosis (HCC) 02/24/2022   Smokes with greater than 30 pack year history 11/18/2021   Coronary artery disease of native artery of native  heart with stable angina pectoris (HCC) 05/19/2021   Dyslipidemia 07/30/2020   Polyp of colon    Type 2 diabetes mellitus, controlled (HCC) 02/08/2018   History of myocardial infarction 11/08/2017   Tobacco use 11/08/2017   Ischemic cardiomyopathy 11/08/2017   CHF (congestive heart failure) (HCC) 11/08/2017   Foot callus 11/08/2017   Onychomycosis of multiple toenails with type 2 diabetes mellitus (HCC) 11/08/2017   Cataract, nuclear sclerotic, left eye 04/04/2016    Past Surgical History:  Procedure Laterality Date   CATARACT EXTRACTION Bilateral    COLONOSCOPY WITH PROPOFOL N/A 04/04/2019   Procedure: COLONOSCOPY WITH PROPOFOL;  Surgeon: Pasty Spillers, MD;  Location: [facility];  Service: Endoscopy;  Laterality: N/A;   CYSTOSCOPY WITH FULGERATION N/A 05/14/2023   Procedure: CYSTOSCOPY WITH CLOT EVACUATION AND FULGERATION;  Surgeon: Vanna Scotland, MD;  Location: [facility];  Service: Urology;  Laterality: N/A;   HOLEP-LASER ENUCLEATION OF THE PROSTATE WITH MORCELLATION N/A 06/08/2023   Procedure: HOLEP-LASER ENUCLEATION OF THE PROSTATE WITH MORCELLATION;  Surgeon: Sondra Come, MD;  Location: [facility];  Service: Urology;  Laterality: N/A;   LEFT HEART CATH AND CORONARY ANGIOGRAPHY N/A 07/21/2017   Procedure: LEFT HEART CATH AND CORONARY ANGIOGRAPHY;  Surgeon: Iran Ouch, MD;  Location: [facility];  Service: Cardiovascular;  Laterality: N/A;   LUMBAR LAMINECTOMY/DECOMPRESSION MICRODISCECTOMY Bilateral 04/05/2023   Procedure: MINIMALLY INVASIVE (MIS) L2-3 LAMINECTOMY AND DISCECTOMY;  Surgeon: Loreen Freud, MD;  Location: [facility];  Service: Neurosurgery;  Laterality: Bilateral;   XI ROBOTIC ASSISTED INGUINAL HERNIA REPAIR WITH MESH Left 05/19/2022   Procedure: XI ROBOTIC ASSISTED INGUINAL HERNIA REPAIR WITH MESH;  Surgeon: Campbell Lerner, MD;  Location: [facility];  Service: General;  Laterality: Left;    Family History  Problem Relation Age of Onset    Heart disease Mother    Hyperlipidemia Mother    Hypertension Mother    Heart attack Sister     Social History   Tobacco Use   Smoking status: Every Day    Current packs/day: 1.00    Average packs/day: 1 pack/day for 49.2 years (49.2 ttl pk-yrs)    Types: Cigarettes    Start date: 08/21/1974    Passive exposure: Current   Smokeless tobacco: Never   Tobacco comments:    Started smoking around 68 y/o has stopped several times  Vaping Use   Vaping status: Never Used  Substance Use Topics   Alcohol use: Yes    Comment: rare   Drug use: No     No Known Allergies  Health Maintenance  Topic Date Due   OPHTHALMOLOGY EXAM  10/24/2023 (Originally 12/08/2022)   COVID-19 Vaccine (4 - 2024-25 season) 11/08/2023 (Originally 04/22/2023)   INFLUENZA VACCINE  11/19/2023 (Originally 03/22/2023)   Zoster Vaccines- Shingrix (1 of 2) 01/23/2024 (Originally 12/26/2004)   DTaP/Tdap/Td (1 - Tdap) 07/23/2024 (Originally 12/26/1973)  HEMOGLOBIN A1C  01/22/2024   Colonoscopy  04/03/2024   Diabetic kidney evaluation - eGFR measurement  07/23/2024   Diabetic kidney evaluation - Urine ACR  07/23/2024   FOOT EXAM  07/23/2024   Lung Cancer Screening  07/24/2024   Pneumonia Vaccine 64+ Years old  Completed   Hepatitis C Screening  Completed   HPV VACCINES  Aged Out    Chart Review Today: I personally reviewed active problem list, medication list, allergies, family history, social history, health maintenance, notes from last encounter, lab results, imaging with the patient/caregiver today.   Review of Systems  Constitutional: Negative.   HENT: Negative.    Eyes: Negative.   Respiratory: Negative.    Cardiovascular: Negative.   Gastrointestinal: Negative.   Endocrine: Negative.   Genitourinary: Negative.   Musculoskeletal: Negative.   Skin: Negative.   Allergic/Immunologic: Negative.   Neurological: Negative.   Hematological: Negative.   Psychiatric/Behavioral: Negative.    All other systems  reviewed and are negative.       Objective:   Vitals:   10/24/23 1408  BP: 118/70  Pulse: 65  Resp: 16  SpO2: 98%  Weight: 213 lb (96.6 kg)  Height: 6' (1.829 m)    Body mass index is 28.89 kg/m.  Physical Exam Vitals and nursing note reviewed.  Constitutional:      General: He is not in acute distress.    Appearance: Normal appearance. He is well-developed. He is not ill-appearing, toxic-appearing or diaphoretic.  HENT:     Head: Normocephalic and atraumatic.     Nose: Nose normal.  Eyes:     General:        Right eye: No discharge.        Left eye: No discharge.     Conjunctiva/sclera: Conjunctivae normal.  Neck:     Trachea: No tracheal deviation.  Cardiovascular:     Rate and Rhythm: Normal rate and regular rhythm.     Pulses: Normal pulses.     Heart sounds: Normal heart sounds.  Pulmonary:     Effort: Pulmonary effort is normal. No respiratory distress.     Breath sounds: Normal breath sounds. No stridor.  Skin:    General: Skin is warm and dry.     Findings: No rash.  Neurological:     Mental Status: He is alert.     Motor: No abnormal muscle tone.     Coordination: Coordination normal.  Psychiatric:        Mood and Affect: Mood normal.        Behavior: Behavior normal.      Functional Status Survey:   Results for orders placed or performed in visit on 10/18/23  Bladder Scan (Post Void Residual) in office   Collection Time: 10/18/23  3:17 PM  Result Value Ref Range   Scan Result       Assessment & Plan:   Controlled type 2 diabetes mellitus without complication, without long-term current use of insulin (HCC) Assessment & Plan: Lab Results  Component Value Date   HGBA1C 6.2 (H) 07/24/2023  Well controlled with A1C at goal with metformin and jardiance Continue meds and diet/lifestyle efforts Labs not needed today will do at next OV    Chronic congestive heart failure, unspecified heart failure type [facility]) Assessment &  Plan: Managed by cardiology, pt appears euvolemic today, no concerning signs or sx   Coronary artery disease of native artery of native heart with stable angina pectoris [facility]) Assessment & Plan: Managed  by cardiology, some recent testing and med changes   Primary hypertension Assessment & Plan: Blood pressure at goal today and well-controlled with current medications mostly prescribed and managed by cardiology including carvedilol, Entresto, spironolactone BP Readings from Last 3 Encounters:  10/24/23 118/70  10/18/23 103/65  09/11/23 (!) 140/67      Mixed hyperlipidemia Assessment & Plan: Lipids well controlled with atorvastatin 80 mg daily (cardiology may want goal less than 55) Compliant with meds, no SE, no myalgias, fatigue or jaundice Lab Results  Component Value Date   CHOL 113 07/24/2023   HDL 25 (L) 07/24/2023   LDLCALC 68 07/24/2023   TRIG 122 07/24/2023   CHOLHDL 4.5 07/24/2023     S/P lumbar microdiscectomy Assessment & Plan: Per neurosurgery, improving sx since surgery about to return to work and complete PT   Impaired functional mobility, balance, gait, and endurance Assessment & Plan: He is completing his physical therapy and has improving mobility gait balance and endurance he is still using a cane   Intervertebral lumbar disc disorder with myelopathy, lumbar region Assessment & Plan: Managing with neurosurgery and physical therapy   Benign prostatic hyperplasia with lower urinary tract symptoms, symptom details unspecified Assessment & Plan: Recent urinary retention went to hospital had urology consult ultimately did HOLEP and his urinary sx have remained improved since that time (last sept) He is following with urology, Dr. Richardo Hanks    Tobacco use Assessment & Plan: Currently 3/4 ppd hoping to cut back or quit   Encounter for smoking cessation counseling Smoking cessation instruction/counseling given:  counseled patient on the dangers of  tobacco use, advised patient to stop smoking, and reviewed strategies to maximize success Encouraged him to try chantix and other resources provided today.  More than 6 minutes time dedicated to this discussion today     Return for 3-4 months for DM HTN f/up and labs .   Danelle Berry, PA-C 10/24/23 2:17 PM

## 2023-10-25 NOTE — Telephone Encounter (Signed)
 This is a Educational psychologist pt

## 2023-10-30 ENCOUNTER — Ambulatory Visit: Payer: 59

## 2023-10-30 DIAGNOSIS — M6281 Muscle weakness (generalized): Secondary | ICD-10-CM

## 2023-10-30 DIAGNOSIS — R278 Other lack of coordination: Secondary | ICD-10-CM

## 2023-10-30 DIAGNOSIS — R2681 Unsteadiness on feet: Secondary | ICD-10-CM

## 2023-10-30 DIAGNOSIS — R262 Difficulty in walking, not elsewhere classified: Secondary | ICD-10-CM

## 2023-10-30 NOTE — Therapy (Signed)
 OUTPATIENT PHYSICAL THERAPY TREATMENT      Patient Name: Lawrence Holmes MRN: [id number] DOB:[birth year], 68 y.o., male Today's Date: 10/30/2023  END OF SESSION:  PT End of Session - 10/30/23 0929     Visit Number 34    Number of Visits 43    Date for PT Re-Evaluation 11/14/23    Authorization Type [employer] Medicare    Progress Note Due on Visit 30    PT Start Time 0926    PT Stop Time 1000    PT Time Calculation (min) 34 min    Equipment Utilized During Treatment Gait belt    Activity Tolerance Patient tolerated treatment well    Behavior During Therapy WFL for tasks assessed/performed                           Past Medical History:  Diagnosis Date   Aortic atherosclerosis (HCC)    Arthritis    BPH with urinary obstruction    Chronic systolic heart failure (HCC) 07/23/2017   a. TTE 07/23/2017: EF 30-35%, mild LVH, inferolateral and inferior AK, mild BAE, G1DD; b. TTE 10/23/2017: EF 40-45%, mild LVH, basal-mid inferior and inferoseptal AK, mild MR; c. TTE 11/19/2021: EF 45-50%, glob HK with mod to severe inferior and basal to mid septal HK, GLS -15.3%, mild MR, G1DD; d. 07/2023 Echo: EF 40-45%, inf/post HK, GrI DD, nl RV fxn.   Coronary artery disease 07/21/2017   a. 07/2017 Late presenting inferior STEMI/Cath: LM nl, LAD min irregs, LCX 100d (L->L collats), RCA 100p (L->R collats), EF 25-35%-->Med Rx; b. 10/2017 ETT (DOT): Ex time 7:38, baseline antlat TWI, no acute changes; c. 09/2019 ETT: Ex time 6:49. No ECG changes.   DDD (degenerative disc disease), lumbar    a.) s/p MIS L2-3 laminectomy and disectomy   Diverticulosis of colon    Erectile dysfunction    HTN (hypertension)    Hx of bilateral cataract extraction    Hyperlipidemia    Ischemic cardiomyopathy    a. LHC 07/21/2017: EF 25-35%; b. TTE 07/23/2017: 30-35%; c. TTE 10/23/2017: EF 40-45%; d. TTE 11/19/2021: EF 45-50%; e. 07/2023 Echo: EF 40-45%.   Left inguinal hernia    a.) s/p repair 05/19/2022   PVC's  (premature ventricular contractions)    a. 06/2023 72 hr Zio: Predominantly sinus rhythm at an average rate of 81 (57-1 33).  1 7 beat run of SVT at 133.  1.3% PAC burden.  14% PVC burden.   ST elevation myocardial infarction (STEMI) of inferior wall (HCC) 07/21/2017   a. LHC 07/21/2017: EF 25-35%, LVEDP 14 mmHg. 40% pLCx, 40% mLCx, 100% dLCx, 100% pRCA --> attempted to cross wire at RCA lesion, however unable. Reasonable RCA collaterals already formed --> med mgmt.   T2DM (type 2 diabetes mellitus) (HCC)    Tobacco use    Past Surgical History:  Procedure Laterality Date   CATARACT EXTRACTION Bilateral    COLONOSCOPY WITH PROPOFOL N/A 04/04/2019   Procedure: COLONOSCOPY WITH PROPOFOL;  Surgeon: Pasty Spillers, MD;  Location: [facility];  Service: Endoscopy;  Laterality: N/A;   CYSTOSCOPY WITH FULGERATION N/A 05/14/2023   Procedure: CYSTOSCOPY WITH CLOT EVACUATION AND FULGERATION;  Surgeon: Vanna Scotland, MD;  Location: [facility];  Service: Urology;  Laterality: N/A;   HOLEP-LASER ENUCLEATION OF THE PROSTATE WITH MORCELLATION N/A 06/08/2023   Procedure: HOLEP-LASER ENUCLEATION OF THE PROSTATE WITH MORCELLATION;  Surgeon: Sondra Come, MD;  Location: [facility];  Service: Urology;  Laterality: N/A;   LEFT HEART CATH AND CORONARY ANGIOGRAPHY N/A 07/21/2017   Procedure: LEFT HEART CATH AND CORONARY ANGIOGRAPHY;  Surgeon: Iran Ouch, MD;  Location: [facility];  Service: Cardiovascular;  Laterality: N/A;   LUMBAR LAMINECTOMY/DECOMPRESSION MICRODISCECTOMY Bilateral 04/05/2023   Procedure: MINIMALLY INVASIVE (MIS) L2-3 LAMINECTOMY AND DISCECTOMY;  Surgeon: Loreen Freud, MD;  Location: [facility];  Service: Neurosurgery;  Laterality: Bilateral;   XI ROBOTIC ASSISTED INGUINAL HERNIA REPAIR WITH MESH Left 05/19/2022   Procedure: XI ROBOTIC ASSISTED INGUINAL HERNIA REPAIR WITH MESH;  Surgeon: Campbell Lerner, MD;  Location: [facility];  Service: General;  Laterality: Left;    Patient Active Problem List   Diagnosis Date Noted   S/P lumbar microdiscectomy 05/16/2023   Hematuria 05/12/2023   Acute urinary retention 05/12/2023   Bilateral hydronephrosis 05/12/2023   HTN (hypertension) 05/12/2023   HLD (hyperlipidemia) 05/12/2023   Type II diabetes mellitus with renal manifestations (HCC) 05/12/2023   Chronic kidney disease, stage 3a (HCC) 05/12/2023   Chronic systolic CHF (congestive heart failure) (HCC) 05/12/2023   BPH (benign prostatic hyperplasia) 05/12/2023   UTI (urinary tract infection) 05/12/2023   CAD (coronary artery disease) 05/12/2023   Intervertebral lumbar disc disorder with myelopathy, lumbar region 04/02/2023   Impaired functional mobility, balance, gait, and endurance 03/26/2023   Bilateral leg weakness 03/26/2023   Abnormal prostate exam 05/09/2022   Aortic atherosclerosis (HCC) 02/24/2022   Smokes with greater than 30 pack year history 11/18/2021   Coronary artery disease of native artery of native heart with stable angina pectoris (HCC) 05/19/2021   Dyslipidemia 07/30/2020   Polyp of colon    Type 2 diabetes mellitus, controlled (HCC) 02/08/2018   History of myocardial infarction 11/08/2017   Tobacco use 11/08/2017   Ischemic cardiomyopathy 11/08/2017   CHF (congestive heart failure) (HCC) 11/08/2017   Foot callus 11/08/2017   Onychomycosis of multiple toenails with type 2 diabetes mellitus (HCC) 11/08/2017   Cataract, nuclear sclerotic, left eye 04/04/2016    PCP: Danelle Berry, PA-C  REFERRING PROVIDER: Oswaldo Conroy Mecum, PA-C  REFERRING DIAG:  M51.06 (ICD-10-CM) - Intervertebral lumbar disc disorder with myelopathy, lumbar region  Z74.09 (ICD-10-CM) - Impaired functional mobility, balance, gait, and endurance  R29.898 (ICD-10-CM) - Bilateral leg weakness  Z98.890 (ICD-10-CM) - S/P lumbar microdiscectomy    Rationale for Evaluation and Treatment: Rehabilitation  THERAPY DIAG:  Unsteadiness on feet  Difficulty in walking,  not elsewhere classified  Muscle weakness (generalized)  Other lack of coordination  ONSET DATE: 03/2023  SUBJECTIVE:  SUBJECTIVE STATEMENT: Patient reports going back to work today- [employer] called and asked him and he states he is ready.    PERTINENT HISTORY:  Approximately 6 weeks status post Right L2-L3 Laminectomy and microdiscectomy (04/05/23).  He states that he has graduated from his wheelchair and is almost only using his walker, trying to graduate to his cane.  Patient has a past medical history of arthritis, congestive heart failure, CID, CDD, enlarged prostate, HTN, HLD, left inguinal hernia, type 2 diabetes. 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    PAIN:  Are you having pain? No     PRECAUTIONS: Back   He can increase his weight to 25 pounds  RED FLAGS: None   WEIGHT BEARING RESTRICTIONS:  No lifting more than 25 pounds   FALLS:  Has patient fallen in last 6 months? No  LIVING ENVIRONMENT: Lives with: lives alone Lives in: House/apartment Stairs: Yes: External: 3 steps; on left going up Has following equipment at home: Single point cane and Walker - 2 wheeled, grab bars in shower   OCCUPATION: Engineer, drilling   PLOF: Independent  PATIENT GOALS: "Get back to walking without a walker or cane"   NEXT MD VISIT: 06/08/23 : prostate procedure   OBJECTIVE:  Note: Objective measures were completed at Evaluation unless otherwise noted.  DIAGNOSTIC FINDINGS:  IMPRESSION from MRI 03/26/23 1. Severe spinal canal narrowing at L2-L3 secondary to a combination of ligamentum flavum hypertrophy, short pedicles, and a disc bulge. 2. Severe left and moderate right neural foraminal narrowing  at L5-S1.   PATIENT SURVEYS:  FOTO 05/29/23: 38  Typical result : 51   COGNITION: Overall cognitive status: Within functional limits for tasks assessed     SENSATION: Light touch: WFL   LOWER EXTREMITY MMT:    MMT Right eval Left eval  Hip flexion 4-/5 4-/5  Hip extension    Hip abduction 4 4  Hip adduction 4- 4  Hip internal rotation    Hip external rotation    Knee flexion 4-/5 4-/5  Knee extension 4+ 4+  Ankle dorsiflexion 4/5 4/5  Ankle plantarflexion 4-/5 4-/5  Ankle inversion    Ankle eversion     (Blank rows = not tested)    FUNCTIONAL TESTS:  5 times sit to stand: 16.59s intermittent hand use 10 meter walk test: 20.97  .5m/s with walker    TODAY'S TREATMENT: DATE: 10/30/23  Physical therapy treatment session today consisted of completing assessment of goals and administration of testing as demonstrated and documented in flow sheet, treatment, and goals section of this note. Addition treatments may be found below.   DGI= 22/24  [facility] PT Assessment - 10/30/23 0938       Ambulation/Gait   Ambulation/Gait Yes    Ambulation/Gait Assistance 7: Independent      Dynamic Gait Index   Level Surface Normal    Change in Gait Speed Normal    Gait with Horizontal Head Turns Normal    Gait with Vertical Head Turns Normal    Gait and Pivot Turn Mild Impairment    Step Over Obstacle Normal    Step Around Obstacles Normal    Steps Mild Impairment    Total Score 22             10 Meter Walk Test: Patient instructed to walk 10 meters (32.8 ft) as quickly and as safely as possible at their normal speed x2 and at  a fast speed x2. Time measured from 2 meter mark to 8 meter mark to accommodate ramp-up and ramp-down.  Normal speed 1: 0.97 m/s Normal speed 2: 0.97 m/s Average Normal speed: 0.97 m/s Cut off scores: <0.4 m/s = household Ambulator, 0.4-0.8 m/s = limited community Ambulator, >0.8 m/s = community Ambulator, >1.2 m/s = crossing a street, <1.0 =  increased fall risk MCID 0.05 m/s (small), 0.13 m/s (moderate), 0.06 m/s (significant)  (ANPTA Core Set of Outcome Measures for Adults with Neurologic Conditions, 2018)    Self care/home management:  Review of HEP: Walking, Calf raises, Hip marches at counter, sit to stand.    Pt requires therapeutic rest breaks throughout session.   PATIENT EDUCATION:  Education details: HEP, POC, goals  Person educated: Patient Education method: Explanation, Demonstration, and Handouts Education comprehension: verbalized understanding and returned demonstration  HOME EXERCISE PROGRAM: Access Code: [email or web address] URL: [email or web address] Date: 05/29/2023 Prepared by: Precious Bard  Exercises - Standing March with Counter Support  - 1 x daily - 7 x weekly - 2 sets - 10 reps - 5 hold - Heel Raises with Counter Support  - 1 x daily - 7 x weekly - 2 sets - 10 reps - 5 hold - Sit to Stand Without Arm Support  - 1 x daily - 7 x weekly - 2 sets - 10 reps - 5 hold  ASSESSMENT:  CLINICAL IMPRESSION: Patient presents with good motivation for today's discharge visit. He presents with much improved mobility since initiation of services. He is able to walk household distances without an AD and demonstrating improved overall gait speed. He is ambulating community distances with cane and no report of falling. He presented with improved balance today as seen by meeting his balance goal with DGI score of 22/24. He is appropriate for discharge today with plan to go back to work later today. He has met majority of goals with no further PT needs.     OBJECTIVE IMPAIRMENTS: Abnormal gait, decreased activity tolerance, decreased balance, decreased endurance, decreased mobility, difficulty walking, decreased strength, impaired flexibility, and improper body mechanics.   ACTIVITY LIMITATIONS: carrying, lifting, bending, standing, squatting, stairs, transfers, bed mobility, dressing, and locomotion  level  PARTICIPATION LIMITATIONS: meal prep, cleaning, laundry, driving, shopping, occupation, and yard work  PERSONAL FACTORS: arthritis, congestive heart failure, CID, CDD, enlarged prostate, HTN, HLD, left inguinal hernia, type 2 diabetes are also affecting patient's functional outcome.   REHAB POTENTIAL: Good  CLINICAL DECISION MAKING: Evolving/moderate complexity  EVALUATION COMPLEXITY: Moderate   GOALS: Goals reviewed with patient? Yes  SHORT TERM GOALS: Target date: 08/24/2023    Patient will demonstrates ability to ambulate for >2105ft without AD with supervision assist to allow improved access to community  Goal status: initial    LONG TERM GOALS: Target date:11/14/2023    Patient will increase 10 meter walk test to >1.57m/s as to improve gait speed for better community ambulation and to reduce fall risk. Baseline: .47 m/s; 07/09/2023= 0.58 m/s with and without cane 07/26/23: 0.618m/s with [facility]  08/16/23: 0.74 m/s without SPC; 0.92 m/s with [facility] 1/29: 0.86 m/s without SPC  10/30/2023= 0.97 m/s without SPC Goal status: PROGRESSING  2.  Patient will increase FOTO score to equal to or greater than 51 to demonstrate statistically significant improvement in mobility and quality of life. Baseline: 05/29/23: 38 07/09/2023= 49 07/26/23:47 08/16/23: 47 10/30/2023= 60 Goal status: MET  3.  Patient (> 86 years old) will complete five times sit to stand test in <  15 seconds indicating an increased LE strength and improved balance. Baseline: 16.59 Intermittent hand use; 07/09/2023= 18.45 sec without UE support  07/26/23: 16.32 without UE Support  08/16/23: 11.70 without UE support Goal status: MET  4. Patient will ambulate 150 ft without the use of an assistive device in order to increase independence and improve quality of life.  Baseline: Not tested  07/26/23: able to ambulate >178ft with no AD and CGA only. Mild staggering R but no LOB  08/16/23: Pt able to ambulate 300' with no  AD and CGA only.   goal status:MET  5.  Patient will increase Berg Balance score by > 6 points to demonstrate decreased fall risk during functional activities. Baseline: awaiting testing until precautions are lifted; 07/10/2023= 41/56 07/26/23: 43/56 08/16/23: 48/56 09/19/23: 49/56  Goal status: MET   6. Pt will improved DGI without AD by 4 points to indicate increased safety and reduced fall risk and improved community access.  Baseline: to be completed 08/20/23: 18/24 1/29: 19/24 10/30/2023= 22/24 Status :    PLAN:  PT FREQUENCY: 1-2x/week  PT DURATION: 8 weeks  PLANNED INTERVENTIONS: Therapeutic exercises, Therapeutic activity, Neuromuscular re-education, Balance training, Gait training, Patient/Family education, Self Care, Joint mobilization, Stair training, Vestibular training, Canalith repositioning, DME instructions, Dry Needling, Electrical stimulation, Cryotherapy, Moist heat, scar mobilization, Taping, Manual therapy, and Re-evaluation.  PLAN FOR NEXT SESSION:  Discharge patient today.    Lenda Kelp, PT Physical Therapist - [facility]  10:08 AM 10/30/23

## 2023-11-06 ENCOUNTER — Ambulatory Visit

## 2023-11-13 ENCOUNTER — Ambulatory Visit

## 2023-11-22 ENCOUNTER — Encounter: Payer: Self-pay | Admitting: Cardiology

## 2023-11-22 ENCOUNTER — Ambulatory Visit

## 2023-11-26 ENCOUNTER — Ambulatory Visit

## 2023-12-03 ENCOUNTER — Ambulatory Visit: Admitting: Physical Therapy

## 2023-12-10 ENCOUNTER — Ambulatory Visit: Admitting: Physical Therapy

## 2023-12-17 ENCOUNTER — Ambulatory Visit: Payer: 59 | Attending: Cardiology | Admitting: Cardiology

## 2023-12-17 VITALS — BP 134/70 | HR 67 | Ht 73.0 in | Wt 219.6 lb

## 2023-12-17 DIAGNOSIS — I5022 Chronic systolic (congestive) heart failure: Secondary | ICD-10-CM

## 2023-12-17 DIAGNOSIS — I493 Ventricular premature depolarization: Secondary | ICD-10-CM

## 2023-12-17 NOTE — Patient Instructions (Signed)
 Medication Instructions:  The current medical regimen is effective;  continue present plan and medications as directed. Please refer to the Current Medication list given to you today.   *If you need a refill on your cardiac medications before your next appointment, please call your pharmacy*  Follow-Up: At [facility], you and your health needs are our priority.  As part of our continuing mission to provide you with exceptional heart care, our providers are all part of one team.  This team includes your primary Cardiologist (physician) and Advanced Practice Providers or APPs (Physician Assistants and Nurse Practitioners) who all work together to provide you with the care you need, when you need it.  Your next appointment:   6 month(s)  Provider:   Nobie Putnam, MD or Sherie Don, NP    We recommend signing up for the patient portal called "MyChart".  Sign up information is provided on this After Visit Summary.  MyChart is used to connect with patients for Virtual Visits (Telemedicine).  Patients are able to view lab/test results, encounter notes, upcoming appointments, etc.  Non-urgent messages can be sent to your provider as well.   To learn more about what you can do with MyChart, go to [email or web address].

## 2023-12-17 NOTE — Progress Notes (Signed)
 Electrophysiology Clinic Note    Date:  12/17/2023  Patient ID:  Lawrence Holmes, DOB 10-30-1955, MRN [id number] PCP:  Adeline Hone, PA-C  Cardiologist:  Antionette Kirks, MD Electrophysiologist: Ardeen Kohler, MD   Discussed the use of AI scribe software for clinical note transcription with the patient, who gave verbal consent to proceed.   Patient Profile    Chief Complaint: PVC follow-up  History of Present Illness: Lawrence Holmes is a 68 y.o. male with PMH notable for PVC, HFrEF, ICM, CAD, HTN, T2DM; seen today for Ardeen Kohler, MD for routine electrophysiology followup.   He last saw Dr. Daneil Dunker 08/2023 for EP consult of PVCs. His reduced LVEF pre-dated his elevated PVC burden, and patient was asymptomatic of PVCs. He transitioned coreg  > metop and started mexiletine. After OV, he called clinic with complaints of fatigue and dry mouth. He was switched back to toprol  and mex dose lowered.   On follow-up today, he feels very well currently. He remains asymptomatic of his PVCs. His fatigue and dry mouth have both improved, in fact he does not remember having either symptom. He continues to work regularly and remain active.   he denies chest pain, palpitations, dyspnea, PND, orthopnea, nausea, vomiting, dizziness, syncope, edema, weight gain, or early satiety.      Arrhythmia/Device History Mexiletine    ROS:  Please see the history of present illness. All other systems are reviewed and otherwise negative.    Physical Exam    VS:  BP 134/70 (BP Location: Left Arm, Patient Position: Sitting, Cuff Size: Normal)   Pulse 67   Ht 6\' 1"  (1.854 m)   Wt 219 lb 9.6 oz (99.6 kg)   BMI 28.97 kg/m  BMI: Body mass index is 28.97 kg/m.  Wt Readings from Last 3 Encounters:  12/17/23 219 lb 9.6 oz (99.6 kg)  10/24/23 213 lb (96.6 kg)  10/18/23 213 lb (96.6 kg)     GEN- The patient is well appearing, alert and oriented x 3 today.   Lungs- Clear to ausculation bilaterally, normal work  of breathing.  Heart- Regular rate and rhythm, no murmurs, rubs or gallops Extremities- No peripheral edema, warm, dry    Studies Reviewed   Previous EP, cardiology notes.    EKG is ordered. Personal review of EKG from today shows:    EKG Interpretation Date/Time:  Monday December 17 2023 14:05:08 EDT Ventricular Rate:  67 PR Interval:  192 QRS Duration:  96 QT Interval:  406 QTC Calculation: 429 R Axis:   9  Text Interpretation: Normal sinus rhythm Inferior infarct (cited on or before 21-Jul-2017) Confirmed by Robie Oats [id number]) on 12/17/2023 2:19:57 PM    Long term monitor, 11/19/2023 HR 47 - 133, average 80 bpm. 1 nonsustained SVT (longest 14 beats). No atrial fibrillation detected. Rare supraventricular ectopy. Rare ventricular ectopy. No sustained arrhythmias. No symptom trigger episodes.  TTE, 07/31/2023  1. Left ventricular ejection fraction, by estimation, is 40 to 45%. The left ventricle has mildly decreased function. The left ventricle demonstrates regional wall motion abnormalities (hypokinesis of the inferior/posterior wall, basal to mid septal region). Left ventricular diastolic parameters are consistent with Grade I  diastolic dysfunction (impaired relaxation). The average left ventricular global longitudinal strain is -14.2 %.   2. Right ventricular systolic function is normal. The right ventricular size is normal. Tricuspid regurgitation signal is inadequate for assessing PA pressure.   3. The mitral valve is normal in structure. No evidence of mitral valve  regurgitation. No evidence of mitral stenosis.   4. The aortic valve is tricuspid. Aortic valve regurgitation is not visualized. No aortic stenosis is present.   5. The inferior vena cava is normal in size with greater than 50% respiratory variability, suggesting right atrial pressure of 3 mmHg.   Long term monitor, 06/29/2023 Patient had a min HR of 57 bpm, max HR of 133 bpm, and avg HR of 81 bpm. Predominant  underlying rhythm was Sinus Rhythm. 1 run of Supraventricular Tachycardia occurred lasting 7 beats with a max rate of 133 bpm (avg 126 bpm).  Occasional PACs with a burden of 1.3%. Frequent PVCs with a burden of 14%. Some PVCs may be PACs with possible aberrancy.  TTE, 12/09/2021  1. Left ventricular ejection fraction, by estimation, is 45 to 50%. The left ventricle has mildly decreased function. The left ventricle demonstrates mild global hypokinesis with moderate to severe hypokinesis of the inferior and basal to mid septal wall. Left ventricular diastolic parameters are consistent with Grade I diastolic dysfunction (impaired relaxation). The average left ventricular  global longitudinal strain is -15.3 %.   2. Right ventricular systolic function is normal. The right ventricular size is normal. Tricuspid regurgitation signal is inadequate for assessing PA pressure.   3. The mitral valve is normal in structure. Mild mitral valve regurgitation. No evidence of mitral stenosis.   4. The aortic valve is tricuspid. Aortic valve regurgitation is not visualized. No aortic stenosis is present.   5. The inferior vena cava is normal in size with greater than 50% respiratory variability, suggesting right atrial pressure of 3 mmHg.   Comparison(s): LVEF 40-45%.     Assessment and Plan     #) PVC Recent updated monitor with significant reduction in PVC burden (20% down to < 1%) He remains asymptomatic Continue mexiletine 150g BID Continue coreg  6.25mg  BID  #) HFmrEF Patient appears euvolemic on exam Continue meds as per gen cards        Current medicines are reviewed at length with the patient today.   The patient does not have concerns regarding his medicines.  The following changes were made today:  none  Labs/ tests ordered today include:  Orders Placed This Encounter  Procedures   EKG 12-Lead     Disposition: Follow up with Dr. Daneil Dunker or EP APP in 6 months   Signed, Deeya Richeson, NP  12/17/23  4:21 PM  Electrophysiology [facility]

## 2023-12-18 ENCOUNTER — Other Ambulatory Visit: Payer: Self-pay | Admitting: Family Medicine

## 2023-12-18 DIAGNOSIS — E119 Type 2 diabetes mellitus without complications: Secondary | ICD-10-CM

## 2023-12-21 NOTE — Telephone Encounter (Signed)
 Requested Prescriptions  Pending Prescriptions Disp Refills   empagliflozin  (JARDIANCE ) 10 MG TABS tablet [Pharmacy Med Name: JARDIANCE  10MG  TABLETS] 90 tablet 0    Sig: TAKE 1 TABLET(10 MG) BY MOUTH DAILY BEFORE BREAKFAST     Endocrinology:  Diabetes - SGLT2 Inhibitors Passed - 12/21/2023  9:45 AM      Passed - Cr in normal range and within 360 days    Creat  Date Value Ref Range Status  07/24/2023 1.00 0.70 - 1.35 mg/dL Final   Creatinine, Urine  Date Value Ref Range Status  07/24/2023 117 20 - 320 mg/dL Final         Passed - HBA1C is between 0 and 7.9 and within 180 days    Hgb A1c MFr Bld  Date Value Ref Range Status  07/24/2023 6.2 (H) <5.7 % of total Hgb Final    Comment:    For someone without known diabetes, a hemoglobin  A1c value between 5.7% and 6.4% is consistent with prediabetes and should be confirmed with a  follow-up test. . For someone with known diabetes, a value <7% indicates that their diabetes is well controlled. A1c targets should be individualized based on duration of diabetes, age, comorbid conditions, and other considerations. . This assay result is consistent with an increased risk of diabetes. . Currently, no consensus exists regarding use of hemoglobin A1c for diagnosis of diabetes for children. .          Passed - eGFR in normal range and within 360 days    GFR, Est African American  Date Value Ref Range Status  07/30/2020 89 > OR = 60 mL/min/1.24m2 Final   GFR, Est Non African American  Date Value Ref Range Status  07/30/2020 77 > OR = 60 mL/min/1.64m2 Final   GFR, Estimated  Date Value Ref Range Status  05/15/2023 >60 >60 mL/min Final    Comment:    (NOTE) Calculated using the CKD-EPI Creatinine Equation (2021)    eGFR  Date Value Ref Range Status  07/24/2023 82 > OR = 60 mL/min/1.63m2 Final         Passed - Valid encounter within last 6 months    Recent Outpatient Visits           1 month ago Controlled type 2 diabetes  mellitus without complication, without long-term current use of insulin  [facility])   [facility] Adeline Hone, PA-C       Future Appointments             In 1 month Adeline Hone, PA-C [facility]   In 10 months Estanislao Heimlich, Dennard Fisher, MD [facility] [place]

## 2024-01-06 ENCOUNTER — Other Ambulatory Visit: Payer: Self-pay | Admitting: Cardiology

## 2024-02-09 ENCOUNTER — Other Ambulatory Visit: Payer: Self-pay | Admitting: Cardiovascular Disease

## 2024-02-15 ENCOUNTER — Encounter: Payer: Self-pay | Admitting: Family Medicine

## 2024-02-15 ENCOUNTER — Ambulatory Visit (INDEPENDENT_AMBULATORY_CARE_PROVIDER_SITE_OTHER): Admitting: Family Medicine

## 2024-02-15 VITALS — BP 136/70 | HR 74 | Resp 16 | Ht 73.0 in | Wt 218.0 lb

## 2024-02-15 DIAGNOSIS — E782 Mixed hyperlipidemia: Secondary | ICD-10-CM

## 2024-02-15 DIAGNOSIS — I509 Heart failure, unspecified: Secondary | ICD-10-CM

## 2024-02-15 DIAGNOSIS — E119 Type 2 diabetes mellitus without complications: Secondary | ICD-10-CM | POA: Diagnosis not present

## 2024-02-15 DIAGNOSIS — I25118 Atherosclerotic heart disease of native coronary artery with other forms of angina pectoris: Secondary | ICD-10-CM | POA: Diagnosis not present

## 2024-02-15 DIAGNOSIS — I10 Essential (primary) hypertension: Secondary | ICD-10-CM

## 2024-02-15 DIAGNOSIS — Z1211 Encounter for screening for malignant neoplasm of colon: Secondary | ICD-10-CM

## 2024-02-15 DIAGNOSIS — Z7984 Long term (current) use of oral hypoglycemic drugs: Secondary | ICD-10-CM

## 2024-02-15 NOTE — Assessment & Plan Note (Signed)
 Per cardiology, pt appears euvolemic

## 2024-02-15 NOTE — Assessment & Plan Note (Signed)
 Blood pressure at goal today and well-controlled with current medications mostly prescribed and managed by cardiology including carvedilol , Entresto , spironolactone  BP near goal today BP Readings from Last 3 Encounters:  02/15/24 136/70  12/17/23 134/70  10/24/23 118/70

## 2024-02-15 NOTE — Assessment & Plan Note (Signed)
 Lipids reviewed from Dec OV, will not repeat today, fairly well controlled with atorvastatin  80 mg daily (cardiology may want goal less than 55) Compliant with meds, no SE, no myalgias, fatigue or jaundice Lab Results  Component Value Date   CHOL 113 07/24/2023   HDL 25 (L) 07/24/2023   LDLCALC 68 07/24/2023   TRIG 122 07/24/2023   CHOLHDL 4.5 07/24/2023  Continue atorvastatin  80 mg daily with diet/lifestyle efforts

## 2024-02-15 NOTE — Progress Notes (Signed)
 Name: Lawrence Holmes   MRN: [id number]    DOB: Jan 01, 1956   Date:02/15/2024       Progress Note  Chief Complaint  Patient presents with   Medical Management of Chronic Issues    3-4 month follow-up   Diabetes   Hypertension     Subjective:   Lawrence Holmes is a 68 y.o. male, presents to clinic for routine follow up on chronic conditions  HTN -  On carvedilol , entresto  spironolactone , mexiletine BP Readings from Last 3 Encounters:  02/15/24 136/70  12/17/23 134/70  10/24/23 118/70   Pulse Readings from Last 3 Encounters:  02/15/24 74  12/17/23 67  10/24/23 65   DM on jardiance  10 and metformin  500 mg BID   Lab Results  Component Value Date   HGBA1C 6.2 (H) 07/24/2023   HGBA1C 6.7 (A) 10/03/2022   HGBA1C 6.0 (H) 05/26/2022   HGBA1C 6.0 (H) 02/20/2022   HGBA1C 8.3 (H) 11/18/2021    Lab Results  Component Value Date   CHOL 113 07/24/2023   HDL 25 (L) 07/24/2023   LDLCALC 68 07/24/2023   TRIG 122 07/24/2023   CHOLHDL 4.5 07/24/2023        Current Outpatient Medications:    ACCU-CHEK GUIDE test strip, SMARTSIG:Via Meter 1-4 Times Daily, Disp: , Rfl:    Accu-Chek Softclix Lancets lancets, SMARTSIG:Via Meter 1-4 Times Daily, Disp: , Rfl:    aspirin  81 MG chewable tablet, Chew 1 tablet (81 mg total) by mouth daily., Disp: 30 tablet, Rfl: 2   atorvastatin  (LIPITOR) 80 MG tablet, Take 1 tablet (80 mg total) by mouth at bedtime., Disp: 90 tablet, Rfl: 3   Blood Gluc Meter Disp-Strips (BLOOD GLUCOSE METER DISPOSABLE) DEVI, Check fingerstick blood sugars once a day; E11.69, LON 99 months, Disp: 100 each, Rfl: 1   blood glucose meter kit and supplies KIT, Dispense based on patient and insurance preference. Use up to four times daily as directed. (FOR ICD-9 250.00, 250.01)., Disp: 1 each, Rfl: 0   carvedilol  (COREG ) 6.25 MG tablet, Take 1 tablet (6.25 mg total) by mouth 2 (two) times daily., Disp: 180 tablet, Rfl: 3   empagliflozin  (JARDIANCE ) 10 MG TABS tablet, TAKE 1  TABLET(10 MG) BY MOUTH DAILY BEFORE BREAKFAST, Disp: 90 tablet, Rfl: 0   metFORMIN  (GLUCOPHAGE ) 500 MG tablet, Take 1 tablet (500 mg total) by mouth 2 (two) times daily with a meal., Disp: 180 tablet, Rfl: 3   mexiletine (MEXITIL) 150 MG capsule, TAKE 1 CAPSULE(150 MG) BY MOUTH TWICE DAILY, Disp: 180 capsule, Rfl: 3   sacubitril -valsartan  (ENTRESTO ) 24-26 MG, TAKE 1 TABLET BY MOUTH TWICE DAILY, Disp: 180 tablet, Rfl: 1   spironolactone  (ALDACTONE ) 25 MG tablet, TAKE 1 TABLET(25 MG) BY MOUTH DAILY, Disp: 90 tablet, Rfl: 3   varenicline  (CHANTIX  CONTINUING MONTH PAK) 1 MG tablet, Take 1 tablet (1 mg total) by mouth 2 (two) times daily., Disp: 60 tablet, Rfl: 1  Patient Active Problem List   Diagnosis Date Noted   S/P lumbar microdiscectomy 05/16/2023   Hematuria 05/12/2023   Acute urinary retention 05/12/2023   Bilateral hydronephrosis 05/12/2023   HTN (hypertension) 05/12/2023   HLD (hyperlipidemia) 05/12/2023   Type II diabetes mellitus with renal manifestations (HCC) 05/12/2023   Chronic kidney disease, stage 3a (HCC) 05/12/2023   Chronic systolic CHF (congestive heart failure) (HCC) 05/12/2023   BPH (benign prostatic hyperplasia) 05/12/2023   UTI (urinary tract infection) 05/12/2023   CAD (coronary artery disease) 05/12/2023   Intervertebral lumbar disc  disorder with myelopathy, lumbar region 04/02/2023   Impaired functional mobility, balance, gait, and endurance 03/26/2023   Bilateral leg weakness 03/26/2023   Abnormal prostate exam 05/09/2022   Aortic atherosclerosis (HCC) 02/24/2022   Smokes with greater than 30 pack year history 11/18/2021   Coronary artery disease of native artery of native heart with stable angina pectoris (HCC) 05/19/2021   Dyslipidemia 07/30/2020   Polyp of colon    Type 2 diabetes mellitus, controlled (HCC) 02/08/2018   History of myocardial infarction 11/08/2017   Tobacco use 11/08/2017   Ischemic cardiomyopathy 11/08/2017   CHF (congestive heart  failure) (HCC) 11/08/2017   Foot callus 11/08/2017   Onychomycosis of multiple toenails with type 2 diabetes mellitus (HCC) 11/08/2017   Cataract, nuclear sclerotic, left eye 04/04/2016    Past Surgical History:  Procedure Laterality Date   CATARACT EXTRACTION Bilateral    COLONOSCOPY WITH PROPOFOL  N/A 04/04/2019   Procedure: COLONOSCOPY WITH PROPOFOL ;  Surgeon: Janalyn Keene NOVAK, MD;  Location: [facility];  Service: Endoscopy;  Laterality: N/A;   CYSTOSCOPY WITH FULGERATION N/A 05/14/2023   Procedure: CYSTOSCOPY WITH CLOT EVACUATION AND FULGERATION;  Surgeon: Penne Knee, MD;  Location: [facility] ORS;  Service: Urology;  Laterality: N/A;   HOLEP-LASER ENUCLEATION OF THE PROSTATE WITH MORCELLATION N/A 06/08/2023   Procedure: HOLEP-LASER ENUCLEATION OF THE PROSTATE WITH MORCELLATION;  Surgeon: Francisca Redell BROCKS, MD;  Location: [facility] ORS;  Service: Urology;  Laterality: N/A;   LEFT HEART CATH AND CORONARY ANGIOGRAPHY N/A 07/21/2017   Procedure: LEFT HEART CATH AND CORONARY ANGIOGRAPHY;  Surgeon: Darron Deatrice LABOR, MD;  Location: [facility];  Service: Cardiovascular;  Laterality: N/A;   LUMBAR LAMINECTOMY/DECOMPRESSION MICRODISCECTOMY Bilateral 04/05/2023   Procedure: MINIMALLY INVASIVE (MIS) L2-3 LAMINECTOMY AND DISCECTOMY;  Surgeon: Claudene Penne Lin, MD;  Location: [facility] ORS;  Service: Neurosurgery;  Laterality: Bilateral;   XI ROBOTIC ASSISTED INGUINAL HERNIA REPAIR WITH MESH Left 05/19/2022   Procedure: XI ROBOTIC ASSISTED INGUINAL HERNIA REPAIR WITH MESH;  Surgeon: Lane Shope, MD;  Location: [facility] ORS;  Service: General;  Laterality: Left;    Family History  Problem Relation Age of Onset   Heart disease Mother    Hyperlipidemia Mother    Hypertension Mother    Heart attack Sister     Social History   Tobacco Use   Smoking status: Every Day    Current packs/day: 1.00    Average packs/day: 1 pack/day for 49.5 years (49.5 ttl pk-yrs)    Types: Cigarettes     Start date: 08/21/1974    Passive exposure: Current   Smokeless tobacco: Never   Tobacco comments:    Started smoking around 68 y/o has stopped several times  Vaping Use   Vaping status: Never Used  Substance Use Topics   Alcohol use: Yes    Comment: rare   Drug use: No     No Known Allergies  Health Maintenance  Topic Date Due   HEMOGLOBIN A1C  01/22/2024   Colonoscopy  04/03/2024   OPHTHALMOLOGY EXAM  02/15/2024 (Originally 12/08/2022)   COVID-19 Vaccine (4 - 2024-25 season) 03/01/2024 (Originally 04/22/2023)   Zoster Vaccines- Shingrix (1 of 2) 05/16/2024 (Originally 12/26/2004)   DTaP/Tdap/Td (1 - Tdap) 07/23/2024 (Originally 12/26/1973)   INFLUENZA VACCINE  03/21/2024   Diabetic kidney evaluation - eGFR measurement  07/23/2024   Diabetic kidney evaluation - Urine ACR  07/23/2024   FOOT EXAM  07/23/2024   Lung Cancer Screening  07/24/2024   Pneumococcal Vaccine: 50+ Years  Completed  Hepatitis C Screening  Completed   Hepatitis B Vaccines  Aged Out   HPV VACCINES  Aged Out   Meningococcal B Vaccine  Aged Out    I personally reviewed active problem list, medication list, allergies, family history, social history, health maintenance, notes from last encounter, lab results, imaging with the patient/caregiver today.    Review of Systems  Constitutional: Negative.   HENT: Negative.    Eyes: Negative.   Respiratory: Negative.    Cardiovascular: Negative.   Gastrointestinal: Negative.   Endocrine: Negative.   Genitourinary: Negative.   Musculoskeletal: Negative.   Skin: Negative.   Allergic/Immunologic: Negative.   Neurological: Negative.   Hematological: Negative.   Psychiatric/Behavioral: Negative.    All other systems reviewed and are negative.    Objective:   Vitals:   02/15/24 0941  BP: 136/70  Pulse: 74  Resp: 16  SpO2: 97%  Weight: 218 lb (98.9 kg)  Height: 6' 1 (1.854 m)    Body mass index is 28.76 kg/m.  Physical Exam Vitals and nursing note  reviewed.  Constitutional:      General: He is not in acute distress.    Appearance: Normal appearance. He is well-developed. He is not ill-appearing, toxic-appearing or diaphoretic.  HENT:     Head: Normocephalic and atraumatic.     Right Ear: External ear normal.     Left Ear: External ear normal.     Nose: Nose normal.   Eyes:     General: No scleral icterus.       Right eye: No discharge.        Left eye: No discharge.     Conjunctiva/sclera: Conjunctivae normal.   Neck:     Trachea: No tracheal deviation.   Cardiovascular:     Rate and Rhythm: Normal rate and regular rhythm.     Pulses: Normal pulses.     Heart sounds: Normal heart sounds.  Pulmonary:     Effort: Pulmonary effort is normal. No respiratory distress.     Breath sounds: Normal breath sounds. No stridor. No wheezing, rhonchi or rales.   Musculoskeletal:     Right lower leg: No edema.     Left lower leg: No edema.   Skin:    General: Skin is warm and dry.     Findings: No rash.   Neurological:     Mental Status: He is alert.     Motor: No abnormal muscle tone.     Coordination: Coordination normal.     Gait: Gait normal.   Psychiatric:        Mood and Affect: Mood normal.        Behavior: Behavior normal.            Assessment & Plan:   Controlled type 2 diabetes mellitus without complication, without long-term current use of insulin  [facility]) Assessment & Plan: Lab Results  Component Value Date   HGBA1C 6.2 (H) 07/24/2023  Well controlled with A1C at goal with metformin  and jardiance  Continue meds and diet/lifestyle efforts Labs due today, will recheck UACR if still elevated consider jardiance  dose increase   Orders: -     Hemoglobin A1c -     Comprehensive metabolic panel with GFR -     Microalbumin / creatinine urine ratio  Primary hypertension Assessment & Plan: Blood pressure at goal today and well-controlled with current medications mostly prescribed and managed by cardiology  including carvedilol , Entresto , spironolactone  BP near goal today BP Readings from Last 3 Encounters:  02/15/24 136/70  12/17/23 134/70  10/24/23 118/70     Orders: -     Comprehensive metabolic panel with GFR  Chronic congestive heart failure, unspecified heart failure type [facility]) Assessment & Plan: Per cardiology, pt appears euvolemic  Orders: -     Comprehensive metabolic panel with GFR  Coronary artery disease of native artery of native heart with stable angina pectoris [facility]) Assessment & Plan: Managed by cardiology, some recent testing and med changes Pt still doing well, no sx, BP and HR good today  Orders: -     Comprehensive metabolic panel with GFR  Mixed hyperlipidemia Assessment & Plan: Lipids reviewed from Dec OV, will not repeat today, fairly well controlled with atorvastatin  80 mg daily (cardiology may want goal less than 55) Compliant with meds, no SE, no myalgias, fatigue or jaundice Lab Results  Component Value Date   CHOL 113 07/24/2023   HDL 25 (L) 07/24/2023   LDLCALC 68 07/24/2023   TRIG 122 07/24/2023   CHOLHDL 4.5 07/24/2023  Continue atorvastatin  80 mg daily with diet/lifestyle efforts  Orders: -     Comprehensive metabolic panel with GFR  Colon cancer screening -     Ambulatory referral to Gastroenterology     Return in about 6 months (around 08/16/2024) for Annual Physical, Routine follow-up.   Michelene Cower, PA-C 02/15/24 10:06 AM

## 2024-02-15 NOTE — Assessment & Plan Note (Signed)
 Managed by cardiology, some recent testing and med changes Pt still doing well, no sx, BP and HR good today

## 2024-02-15 NOTE — Assessment & Plan Note (Signed)
 Lab Results  Component Value Date   HGBA1C 6.2 (H) 07/24/2023  Well controlled with A1C at goal with metformin  and jardiance  Continue meds and diet/lifestyle efforts Labs due today, will recheck UACR if still elevated consider jardiance  dose increase

## 2024-02-16 ENCOUNTER — Ambulatory Visit: Payer: Self-pay | Admitting: Family Medicine

## 2024-02-16 LAB — MICROALBUMIN / CREATININE URINE RATIO
Creatinine, Urine: 96 mg/dL (ref 20–320)
Microalb Creat Ratio: 18 mg/g{creat} (ref <30)
Microalb, Ur: 1.7 mg/dL

## 2024-02-16 LAB — COMPREHENSIVE METABOLIC PANEL WITH GFR
AG Ratio: 1.4 (calc) (ref 1.0–2.5)
ALT: 33 U/L (ref 9–46)
AST: 20 U/L (ref 10–35)
Albumin: 4 g/dL (ref 3.6–5.1)
Alkaline phosphatase (APISO): 55 U/L (ref 35–144)
BUN: 11 mg/dL (ref 7–25)
CO2: 26 mmol/L (ref 20–32)
Calcium: 9.6 mg/dL (ref 8.6–10.3)
Chloride: 106 mmol/L (ref 98–110)
Creat: 1.05 mg/dL (ref 0.70–1.35)
Globulin: 2.8 g/dL (ref 1.9–3.7)
Glucose, Bld: 109 mg/dL — ABNORMAL HIGH (ref 65–99)
Potassium: 4.6 mmol/L (ref 3.5–5.3)
Sodium: 139 mmol/L (ref 135–146)
Total Bilirubin: 0.5 mg/dL (ref 0.2–1.2)
Total Protein: 6.8 g/dL (ref 6.1–8.1)
eGFR: 77 mL/min/{1.73_m2} (ref >=60)

## 2024-02-16 LAB — HEMOGLOBIN A1C
Hgb A1c MFr Bld: 7.1 % — ABNORMAL HIGH (ref <5.7)
Mean Plasma Glucose: 157 mg/dL
eAG (mmol/L): 8.7 mmol/L

## 2024-02-18 ENCOUNTER — Other Ambulatory Visit: Payer: Self-pay

## 2024-02-18 ENCOUNTER — Telehealth: Payer: Self-pay

## 2024-02-18 DIAGNOSIS — Z860100 Personal history of colon polyps, unspecified: Secondary | ICD-10-CM

## 2024-02-18 MED ORDER — GOLYTELY 236 G PO SOLR: 4000.0000 mL | Freq: Once | ORAL | 0 refills | Status: AC

## 2024-02-18 NOTE — Telephone Encounter (Signed)
 Opened 2 encounters in error.  Rosaline, CMa

## 2024-02-18 NOTE — Telephone Encounter (Signed)
 Gastroenterology Pre-Procedure Review  Request Date: 05/15/24 Requesting Physician: Dr. Jinny  PATIENT REVIEW QUESTIONS: The patient responded to the following health history questions as indicated:    1. Are you having any GI issues? no 2. Do you have a personal history of Polyps? yes (last colonoscopy performed by Dr. Janalyn 04/04/19 recommended repeat in 5 years) 3. Do you have a family history of Colon Cancer or Polyps? no 4. Diabetes Mellitus? yes (takes jardiance  and metformin  pt advised meds will need to be stopped and noted stop dates on instructions) 5. Joint replacements in the past 12 months?no 6. Major health problems in the past 3 months?no 7. Any artificial heart valves, MVP, or defibrillator?no but has cardiac issues.  Clearance sent to CV Preop team    MEDICATIONS & ALLERGIES:    Patient reports the following regarding taking any anticoagulation/antiplatelet therapy:   Plavix , Coumadin, Eliquis, Xarelto, Lovenox , Pradaxa, Brilinta, or Effient? no Aspirin ? yes (81 mg daily)  Patient confirms/reports the following medications:  Current Outpatient Medications  Medication Sig Dispense Refill   ACCU-CHEK GUIDE test strip SMARTSIG:Via Meter 1-4 Times Daily     Accu-Chek Softclix Lancets lancets SMARTSIG:Via Meter 1-4 Times Daily     aspirin  81 MG chewable tablet Chew 1 tablet (81 mg total) by mouth daily. 30 tablet 2   atorvastatin  (LIPITOR) 80 MG tablet Take 1 tablet (80 mg total) by mouth at bedtime. 90 tablet 3   Blood Gluc Meter Disp-Strips (BLOOD GLUCOSE METER DISPOSABLE) DEVI Check fingerstick blood sugars once a day; E11.69, LON 99 months 100 each 1   blood glucose meter kit and supplies KIT Dispense based on patient and insurance preference. Use up to four times daily as directed. (FOR ICD-9 250.00, 250.01). 1 each 0   carvedilol  (COREG ) 6.25 MG tablet Take 1 tablet (6.25 mg total) by mouth 2 (two) times daily. 180 tablet 3   empagliflozin  (JARDIANCE ) 10 MG TABS  tablet TAKE 1 TABLET(10 MG) BY MOUTH DAILY BEFORE BREAKFAST 90 tablet 0   metFORMIN  (GLUCOPHAGE ) 500 MG tablet Take 1 tablet (500 mg total) by mouth 2 (two) times daily with a meal. 180 tablet 3   mexiletine (MEXITIL) 150 MG capsule TAKE 1 CAPSULE(150 MG) BY MOUTH TWICE DAILY 180 capsule 3   sacubitril -valsartan  (ENTRESTO ) 24-26 MG TAKE 1 TABLET BY MOUTH TWICE DAILY 180 tablet 1   spironolactone  (ALDACTONE ) 25 MG tablet TAKE 1 TABLET(25 MG) BY MOUTH DAILY 90 tablet 3   varenicline  (CHANTIX  CONTINUING MONTH PAK) 1 MG tablet Take 1 tablet (1 mg total) by mouth 2 (two) times daily. 60 tablet 1   No current facility-administered medications for this visit.    Patient confirms/reports the following allergies:  No Known Allergies  No orders of the defined types were placed in this encounter.   AUTHORIZATION INFORMATION Primary Insurance: 1D#: Group #:  Secondary Insurance: 1D#: Group #:  SCHEDULE INFORMATION: Date: 05/15/24 Time: Location: [facility]

## 2024-02-19 ENCOUNTER — Telehealth: Payer: Self-pay | Admitting: *Deleted

## 2024-02-19 ENCOUNTER — Telehealth: Payer: Self-pay

## 2024-02-19 NOTE — Telephone Encounter (Signed)
   Pre-operative Risk Assessment    Patient Name: Lawrence Holmes  DOB: 1956-03-04 MRN: [id number]   Date of last office visit: 12/17/23  Date of next office visit: n/a   Request for Surgical Clearance    Procedure:  Colonoscopy   Date of Surgery:  Clearance 05/15/24                                 Surgeon:  Dr. Rogelia Lamas  Surgeon's Group or Practice Name:  [facility]  Phone number:  [phone] Fax number:  [phone]   Type of Clearance Requested:   - Medical  - Pharmacy:  Hold Aspirin  Not indicated    Type of Anesthesia:  General    Additional requests/questions:    SignedRebeca Blight   02/19/2024, 11:39 AM

## 2024-02-19 NOTE — Telephone Encounter (Signed)
   Name: Lawrence Holmes [place]  DOB: 03/06/1956  MRN: [id number]  Primary Cardiologist: Deatrice Cage, MD   Preoperative team, please contact this patient and set up a phone call appointment for further preoperative risk assessment. Please obtain consent and complete medication review. Thank you for your help.  Please schedule televisit closer to patient's procedure date which is 05/15/2024  I confirm that guidance regarding antiplatelet and oral anticoagulation therapy has been completed and, if necessary, noted below.  Patient can hold ASA 81 mg 5 to 7 days prior to procedure and should restart postprocedure when surgically safe and hemostasis is achieved.  I also confirmed the patient resides in the state of [place] . As per [facility] Medical Board telemedicine laws, the patient must reside in the state in which the provider is licensed.   Wyn Raddle, Jackee Shove, NP 02/19/2024, 11:52 AM [facility]

## 2024-02-19 NOTE — Telephone Encounter (Signed)
 Pt has been scheduled tele preop appt 04/22/24. Med rec and consent are done.

## 2024-02-19 NOTE — Telephone Encounter (Signed)
 Pt has been scheduled tele preop appt 04/22/24. Med rec and consent are done.      Patient Consent for Virtual Visit        Lawrence Holmes [place] has provided verbal consent on 02/19/2024 for a virtual visit (video or telephone).   CONSENT FOR VIRTUAL VISIT FOR:  Lawrence Holmes [place]  By participating in this virtual visit I agree to the following:  I hereby voluntarily request, consent and authorize [facility] and its employed or contracted physicians, physician assistants, nurse practitioners or other licensed health care professionals (the Practitioner), to provide me with telemedicine health care services (the "Services) as deemed necessary by the treating Practitioner. I acknowledge and consent to receive the Services by the Practitioner via telemedicine. I understand that the telemedicine visit will involve communicating with the Practitioner through live audiovisual communication technology and the disclosure of certain medical information by electronic transmission. I acknowledge that I have been given the opportunity to request an in-person assessment or other available alternative prior to the telemedicine visit and am voluntarily participating in the telemedicine visit.  I understand that I have the right to withhold or withdraw my consent to the use of telemedicine in the course of my care at any time, without affecting my right to future care or treatment, and that the Practitioner or I may terminate the telemedicine visit at any time. I understand that I have the right to inspect all information obtained and/or recorded in the course of the telemedicine visit and may receive copies of available information for a reasonable fee.  I understand that some of the potential risks of receiving the Services via telemedicine include:  Delay or interruption in medical evaluation due to technological equipment failure or disruption; Information transmitted may not be sufficient (e.g. poor resolution  of images) to allow for appropriate medical decision making by the Practitioner; and/or  In rare instances, security protocols could fail, causing a breach of personal health information.  Furthermore, I acknowledge that it is my responsibility to provide information about my medical history, conditions and care that is complete and accurate to the best of my ability. I acknowledge that Practitioner's advice, recommendations, and/or decision may be based on factors not within their control, such as incomplete or inaccurate data provided by me or distortions of diagnostic images or specimens that may result from electronic transmissions. I understand that the practice of medicine is not an exact science and that Practitioner makes no warranties or guarantees regarding treatment outcomes. I acknowledge that a copy of this consent can be made available to me via my patient portal [facility] MyChart), or I can request a printed copy by calling the office of [facility].    I understand that my insurance will be billed for this visit.   I have read or had this consent read to me. I understand the contents of this consent, which adequately explains the benefits and risks of the Services being provided via telemedicine.  I have been provided ample opportunity to ask questions regarding this consent and the Services and have had my questions answered to my satisfaction. I give my informed consent for the services to be provided through the use of telemedicine in my medical care

## 2024-02-20 ENCOUNTER — Telehealth: Payer: Self-pay

## 2024-02-20 NOTE — Telephone Encounter (Signed)
 Per Wyn Manus -patient can hold ASA 81 mg 5-7 days prior to procedure and should restart postprocedure when surgically safe and hemostasis is achieved. Patient notified.

## 2024-03-24 ENCOUNTER — Telehealth: Payer: Self-pay

## 2024-03-24 NOTE — Telephone Encounter (Signed)
 [facility] is scheduled with [facility] on 04-22-24.

## 2024-03-30 ENCOUNTER — Other Ambulatory Visit: Payer: Self-pay | Admitting: Family Medicine

## 2024-03-30 DIAGNOSIS — E119 Type 2 diabetes mellitus without complications: Secondary | ICD-10-CM

## 2024-04-02 NOTE — Telephone Encounter (Signed)
 Requested Prescriptions  Pending Prescriptions Disp Refills   JARDIANCE  10 MG TABS tablet [Pharmacy Med Name: JARDIANCE  10MG  TABLETS] 90 tablet 0    Sig: TAKE 1 TABLET(10 MG) BY MOUTH DAILY BEFORE BREAKFAST     Endocrinology:  Diabetes - SGLT2 Inhibitors Passed - 04/02/2024  3:34 PM      Passed - Cr in normal range and within 360 days    Creat  Date Value Ref Range Status  02/15/2024 1.05 0.70 - 1.35 mg/dL Final   Creatinine, Urine  Date Value Ref Range Status  02/15/2024 96 20 - 320 mg/dL Final         Passed - HBA1C is between 0 and 7.9 and within 180 days    Hgb A1c MFr Bld  Date Value Ref Range Status  02/15/2024 7.1 (H) <5.7 % Final    Comment:    For someone without known diabetes, a hemoglobin A1c value of 6.5% or greater indicates that they may have  diabetes and this should be confirmed with a follow-up  test. . For someone with known diabetes, a value <7% indicates  that their diabetes is well controlled and a value  greater than or equal to 7% indicates suboptimal  control. A1c targets should be individualized based on  duration of diabetes, age, comorbid conditions, and  other considerations. . Currently, no consensus exists regarding use of hemoglobin A1c for diagnosis of diabetes for children. .          Passed - eGFR in normal range and within 360 days    GFR, Est African American  Date Value Ref Range Status  07/30/2020 89 > OR = 60 mL/min/1.37m2 Final   GFR, Est Non African American  Date Value Ref Range Status  07/30/2020 77 > OR = 60 mL/min/1.44m2 Final   GFR, Estimated  Date Value Ref Range Status  05/15/2023 >60 >60 mL/min Final    Comment:    (NOTE) Calculated using the CKD-EPI Creatinine Equation (2021)    eGFR  Date Value Ref Range Status  02/15/2024 77 > OR = 60 mL/min/1.82m2 Final         Passed - Valid encounter within last 6 months    Recent Outpatient Visits           1 month ago Controlled type 2 diabetes mellitus without  complication, without long-term current use of insulin  [facility])   [place] [facility] Lawrence Mole, Lawrence Holmes   5 months ago Controlled type 2 diabetes mellitus without complication, without long-term current use of insulin  [facility])   [facility] Lawrence Mole, Lawrence Holmes       Future Appointments             In 4 months Lawrence Mole, Lawrence Holmes [facility]   In 6 months Lawrence Holmes, Lawrence BROCKS, Lawrence Holmes [facility] [place]

## 2024-04-16 ENCOUNTER — Encounter: Payer: Self-pay | Admitting: Urology

## 2024-04-22 ENCOUNTER — Ambulatory Visit: Attending: Cardiology

## 2024-04-22 DIAGNOSIS — Z01810 Encounter for preprocedural cardiovascular examination: Secondary | ICD-10-CM

## 2024-04-22 NOTE — Progress Notes (Signed)
 Virtual Visit via Telephone Note   Because of Lawrence Holmes [place] co-morbid illnesses, he is at least at moderate risk for complications without adequate follow up.  This format is felt to be most appropriate for this patient at this time.  Due to technical limitations with video connection Web designer), today's appointment will be conducted as an audio only telehealth visit, and Lawrence Holmes [place] verbally agreed to proceed in this manner.   All issues noted in this document were discussed and addressed.  No physical exam could be performed with this format.  Evaluation Performed:  Preoperative cardiovascular risk assessment _____________   Date:  04/22/2024   Patient ID:  Lawrence Holmes [place], DOB Jan 06, 1956, MRN [id number] Patient Location:  Home Provider location:   Office  Primary Care Provider:  Leavy Mole, PA-C Primary Cardiologist:  Deatrice Cage, MD  Chief Complaint / Patient Profile   68 y.o. y/o male with a h/o PVC, HFmrEF who is pending colonoscopy and presents today for telephonic preoperative cardiovascular risk assessment.  History of Present Illness    Lawrence Holmes [place] is a 68 y.o. male who presents via Web designer for a telehealth visit today.  Pt was last seen in cardiology clinic on 12/17/2023 by Suzann Riddle, NP.  At that time Lawrence Holmes [place] was doing well .  The patient is now pending procedure as outlined above. Since his last visit, he continues to be stable from a cardiac standpoint.  Today he denies chest pain, shortness of breath, lower extremity edema, fatigue, palpitations, melena, hematuria, hemoptysis, diaphoresis, weakness, presyncope, syncope, orthopnea, and PND.   Past Medical History    Past Medical History:  Diagnosis Date   Aortic atherosclerosis (HCC)    Arthritis    BPH with urinary obstruction    Chronic systolic heart failure (HCC) 07/23/2017   a. TTE 07/23/2017: EF 30-35%, mild LVH, inferolateral and inferior AK, mild BAE, G1DD; b. TTE  10/23/2017: EF 40-45%, mild LVH, basal-mid inferior and inferoseptal AK, mild MR; c. TTE 11/19/2021: EF 45-50%, glob HK with mod to severe inferior and basal to mid septal HK, GLS -15.3%, mild MR, G1DD; Holmes. 07/2023 Echo: EF 40-45%, inf/post HK, GrI DD, nl RV fxn.   Coronary artery disease 07/21/2017   a. 07/2017 Late presenting inferior STEMI/Cath: LM nl, LAD min irregs, LCX 100d (L->L collats), RCA 100p (L->R collats), EF 25-35%-->Med Rx; b. 10/2017 ETT (DOT): Ex time 7:38, baseline antlat TWI, no acute changes; c. 09/2019 ETT: Ex time 6:49. No ECG changes.   DDD (degenerative disc disease), lumbar    a.) s/p MIS L2-3 laminectomy and disectomy   Diverticulosis of colon    Erectile dysfunction    HTN (hypertension)    Hx of bilateral cataract extraction    Hyperlipidemia    Ischemic cardiomyopathy    a. LHC 07/21/2017: EF 25-35%; b. TTE 07/23/2017: 30-35%; c. TTE 10/23/2017: EF 40-45%; Holmes. TTE 11/19/2021: EF 45-50%; e. 07/2023 Echo: EF 40-45%.   Left inguinal hernia    a.) s/p repair 05/19/2022   PVC's (premature ventricular contractions)    a. 06/2023 72 hr Zio: Predominantly sinus rhythm at an average rate of 81 (57-1 33).  1 7 beat run of SVT at 133.  1.3% PAC burden.  14% PVC burden.   ST elevation myocardial infarction (STEMI) of inferior wall (HCC) 07/21/2017   a. LHC 07/21/2017: EF 25-35%, LVEDP 14 mmHg. 40% pLCx, 40% mLCx, 100% dLCx, 100% pRCA --> attempted to cross wire at RCA lesion, however unable.  Reasonable RCA collaterals already formed --> med mgmt.   T2DM (type 2 diabetes mellitus) (HCC)    Tobacco use    Past Surgical History:  Procedure Laterality Date   CATARACT EXTRACTION Bilateral    COLONOSCOPY WITH PROPOFOL  N/A 04/04/2019   Procedure: COLONOSCOPY WITH PROPOFOL ;  Surgeon: Janalyn Keene NOVAK, MD;  Location: [facility];  Service: Endoscopy;  Laterality: N/A;   CYSTOSCOPY WITH FULGERATION N/A 05/14/2023   Procedure: CYSTOSCOPY WITH CLOT EVACUATION AND FULGERATION;   Surgeon: Penne Knee, MD;  Location: [facility] ORS;  Service: Urology;  Laterality: N/A;   HOLEP-LASER ENUCLEATION OF THE PROSTATE WITH MORCELLATION N/A 06/08/2023   Procedure: HOLEP-LASER ENUCLEATION OF THE PROSTATE WITH MORCELLATION;  Surgeon: Francisca Redell BROCKS, MD;  Location: [facility] ORS;  Service: Urology;  Laterality: N/A;   LEFT HEART CATH AND CORONARY ANGIOGRAPHY N/A 07/21/2017   Procedure: LEFT HEART CATH AND CORONARY ANGIOGRAPHY;  Surgeon: Darron Deatrice LABOR, MD;  Location: [facility];  Service: Cardiovascular;  Laterality: N/A;   LUMBAR LAMINECTOMY/DECOMPRESSION MICRODISCECTOMY Bilateral 04/05/2023   Procedure: MINIMALLY INVASIVE (MIS) L2-3 LAMINECTOMY AND DISCECTOMY;  Surgeon: Claudene Penne Lin, MD;  Location: [facility] ORS;  Service: Neurosurgery;  Laterality: Bilateral;   XI ROBOTIC ASSISTED INGUINAL HERNIA REPAIR WITH MESH Left 05/19/2022   Procedure: XI ROBOTIC ASSISTED INGUINAL HERNIA REPAIR WITH MESH;  Surgeon: Lane Shope, MD;  Location: [facility] ORS;  Service: General;  Laterality: Left;    Allergies  No Known Allergies  Home Medications    Prior to Admission medications   Medication Sig Start Date End Date Taking? Authorizing Provider  ACCU-CHEK GUIDE test strip SMARTSIG:Via Meter 1-4 Times Daily 11/25/21   [provider]  Accu-Chek Softclix Lancets lancets SMARTSIG:Via Meter 1-4 Times Daily 11/25/21   [provider]  aspirin  81 MG chewable tablet Chew 1 tablet (81 mg total) by mouth daily. 07/24/17   Laurence Bridegroom, MD  atorvastatin  (LIPITOR) 80 MG tablet Take 1 tablet (80 mg total) by mouth at bedtime. 06/05/23   Vivienne Lonni Ingle, NP  Blood Gluc Meter Disp-Strips (BLOOD GLUCOSE METER DISPOSABLE) DEVI Check fingerstick blood sugars once a day; E11.69, LON 99 months 05/13/18   Hinton Newell SQUIBB, MD  blood glucose meter kit and supplies KIT Dispense based on patient and insurance preference. Use up to four times daily as directed. (FOR ICD-9 250.00,  250.01). 11/23/21   Tapia, Leisa, PA-C  carvedilol  (COREG ) 6.25 MG tablet Take 1 tablet (6.25 mg total) by mouth 2 (two) times daily. 09/28/23   Kennyth Chew, MD  JARDIANCE  10 MG TABS tablet TAKE 1 TABLET(10 MG) BY MOUTH DAILY BEFORE BREAKFAST 04/02/24   Tapia, Leisa, PA-C  metFORMIN  (GLUCOPHAGE ) 500 MG tablet Take 1 tablet (500 mg total) by mouth 2 (two) times daily with a meal. 05/09/23   Tapia, Leisa, PA-C  mexiletine (MEXITIL) 150 MG capsule TAKE 1 CAPSULE(150 MG) BY MOUTH TWICE DAILY 01/08/24   Kennyth Chew, MD  sacubitril -valsartan  (ENTRESTO ) 24-26 MG TAKE 1 TABLET BY MOUTH TWICE DAILY 02/12/24   Darron Deatrice LABOR, MD  spironolactone  (ALDACTONE ) 25 MG tablet TAKE 1 TABLET(25 MG) BY MOUTH DAILY 09/17/23   Darron Deatrice LABOR, MD  varenicline  (CHANTIX  CONTINUING MONTH PAK) 1 MG tablet Take 1 tablet (1 mg total) by mouth 2 (two) times daily. 09/01/23   Vivienne Lonni Ingle, NP    Physical Exam    Vital Signs:  Zannie Holmes [place] does not have vital signs available for review today.  Given telephonic nature of communication, physical exam  is limited. AAOx3. NAD. Normal affect.  Speech and respirations are unlabored.  Accessory Clinical Findings    None  Assessment & Plan    1.  Preoperative Cardiovascular Risk Assessment:  Colonoscopy    Date of Surgery:  Clearance 05/15/24                                  Surgeon:  Dr. Rogelia Lamas  Surgeon's Group or Practice Name:  [facility]  Phone number:  [phone] Fax number:  [phone]      Primary Cardiologist: Deatrice Cage, MD  Chart reviewed as part of pre-operative protocol coverage. Given past medical history and time since last visit, based on ACC/AHA guidelines, Latavion Holmes [place] would be at acceptable risk for the planned procedure without further cardiovascular testing.   Patient was advised that if he develops new symptoms prior to surgery to contact our office to arrange a follow-up appointment.  He verbalized  understanding.  Patient can hold ASA 81 mg 5 to 7 days prior to procedure and should restart postprocedure when surgically safe and hemostasis is achieved.   I will route this recommendation to the requesting party via Epic fax function and remove from pre-op pool.       Time:   Today, I have spent 5 minutes with the patient with telehealth technology discussing medical history, symptoms, and management plan.  I spent 10 minutes reviewing patient's past cardiac history and cardiac medications.    Josefa CHRISTELLA Beauvais, NP  04/22/2024, 7:38 AM

## 2024-04-28 NOTE — Telephone Encounter (Signed)
 Assessment & Plan    1.  Preoperative Cardiovascular Risk Assessment:  Colonoscopy    Date of Surgery:  Clearance 05/15/24                                  Surgeon:  Dr. Rogelia Lamas  Surgeon's Group or Practice Name:  [facility]  Phone number:  [phone] Fax number:  [phone]         Primary Cardiologist: Deatrice Cage, MD   Chart reviewed as part of pre-operative protocol coverage. Given past medical history and time since last visit, based on ACC/AHA guidelines, Lawrence Holmes would be at acceptable risk for the planned procedure without further cardiovascular testing.    Patient was advised that if he develops new symptoms prior to surgery to contact our office to arrange a follow-up appointment.  He verbalized understanding.   Patient can hold ASA 81 mg 5 to 7 days prior to procedure and should restart postprocedure when surgically safe and hemostasis is achieved.    I will route this recommendation to the requesting party via Epic fax function and remove from pre-op pool.             Time:   Today, I have spent 5 minutes with the patient with telehealth technology discussing medical history, symptoms, and management plan.  I spent 10 minutes reviewing patient's past cardiac history and cardiac medications.       Lawrence CHRISTELLA Beauvais, NP   04/22/2024, 7:38 AM

## 2024-05-15 ENCOUNTER — Ambulatory Visit: Admitting: Anesthesiology

## 2024-05-15 ENCOUNTER — Other Ambulatory Visit: Payer: Self-pay

## 2024-05-15 ENCOUNTER — Encounter: Payer: Self-pay | Admitting: Gastroenterology

## 2024-05-15 ENCOUNTER — Ambulatory Visit
Admission: RE | Admit: 2024-05-15 | Discharge: 2024-05-15 | Disposition: A | Discharge status: Home / Self Care | Attending: Gastroenterology | Admitting: Gastroenterology

## 2024-05-15 ENCOUNTER — Encounter
Admission: RE | Disposition: A | Discharge status: Home / Self Care | Payer: Self-pay | Source: Home / Self Care | Attending: Gastroenterology

## 2024-05-15 DIAGNOSIS — I5022 Chronic systolic (congestive) heart failure: Secondary | ICD-10-CM | POA: Insufficient documentation

## 2024-05-15 DIAGNOSIS — K5730 Diverticulosis of large intestine without perforation or abscess without bleeding: Secondary | ICD-10-CM | POA: Insufficient documentation

## 2024-05-15 DIAGNOSIS — K640 First degree hemorrhoids: Secondary | ICD-10-CM | POA: Diagnosis not present

## 2024-05-15 DIAGNOSIS — I2510 Atherosclerotic heart disease of native coronary artery without angina pectoris: Secondary | ICD-10-CM | POA: Insufficient documentation

## 2024-05-15 DIAGNOSIS — I110 Hypertensive heart disease with heart failure: Secondary | ICD-10-CM | POA: Insufficient documentation

## 2024-05-15 DIAGNOSIS — Z79899 Other long term (current) drug therapy: Secondary | ICD-10-CM | POA: Insufficient documentation

## 2024-05-15 DIAGNOSIS — I252 Old myocardial infarction: Secondary | ICD-10-CM | POA: Insufficient documentation

## 2024-05-15 DIAGNOSIS — E119 Type 2 diabetes mellitus without complications: Secondary | ICD-10-CM | POA: Diagnosis not present

## 2024-05-15 DIAGNOSIS — Z7984 Long term (current) use of oral hypoglycemic drugs: Secondary | ICD-10-CM | POA: Insufficient documentation

## 2024-05-15 DIAGNOSIS — J449 Chronic obstructive pulmonary disease, unspecified: Secondary | ICD-10-CM | POA: Insufficient documentation

## 2024-05-15 DIAGNOSIS — D122 Benign neoplasm of ascending colon: Secondary | ICD-10-CM | POA: Insufficient documentation

## 2024-05-15 DIAGNOSIS — K635 Polyp of colon: Secondary | ICD-10-CM | POA: Insufficient documentation

## 2024-05-15 DIAGNOSIS — Z7982 Long term (current) use of aspirin: Secondary | ICD-10-CM | POA: Insufficient documentation

## 2024-05-15 DIAGNOSIS — Z1211 Encounter for screening for malignant neoplasm of colon: Secondary | ICD-10-CM | POA: Diagnosis present

## 2024-05-15 DIAGNOSIS — Z860101 Personal history of adenomatous and serrated colon polyps: Secondary | ICD-10-CM

## 2024-05-15 DIAGNOSIS — F17210 Nicotine dependence, cigarettes, uncomplicated: Secondary | ICD-10-CM | POA: Diagnosis not present

## 2024-05-15 DIAGNOSIS — Z860100 Personal history of colon polyps, unspecified: Secondary | ICD-10-CM

## 2024-05-15 HISTORY — PX: COLONOSCOPY: SHX5424

## 2024-05-15 HISTORY — PX: POLYPECTOMY: SHX149

## 2024-05-15 LAB — GLUCOSE, CAPILLARY: Glucose-Capillary: 105 mg/dL — ABNORMAL HIGH (ref 70–99)

## 2024-05-15 SURGERY — COLONOSCOPY
Anesthesia: General

## 2024-05-15 MED ORDER — LIDOCAINE HCL (CARDIAC) PF 100 MG/5ML IV SOSY
PREFILLED_SYRINGE | INTRAVENOUS | Status: DC | PRN
  Administered 2024-05-15: 80 mg via INTRAVENOUS

## 2024-05-15 MED ORDER — LIDOCAINE HCL (PF) 2 % IJ SOLN
INTRAMUSCULAR | Status: AC
  Filled 2024-05-15: qty 5

## 2024-05-15 MED ORDER — PROPOFOL 500 MG/50ML IV EMUL
INTRAVENOUS | Status: DC | PRN
  Administered 2024-05-15: 75 ug/kg/min via INTRAVENOUS

## 2024-05-15 MED ORDER — DEXMEDETOMIDINE HCL IN NACL 80 MCG/20ML IV SOLN
INTRAVENOUS | Status: DC | PRN
  Administered 2024-05-15: 8 ug via INTRAVENOUS
  Administered 2024-05-15: 12 ug via INTRAVENOUS

## 2024-05-15 MED ORDER — PROPOFOL 10 MG/ML IV BOLUS
INTRAVENOUS | Status: DC | PRN
  Administered 2024-05-15 (×2): 50 mg via INTRAVENOUS

## 2024-05-15 MED ORDER — SODIUM CHLORIDE 0.9 % IV SOLN: INTRAVENOUS | Status: DC

## 2024-05-15 NOTE — Op Note (Signed)
 [facility] Patient Name: Lawrence Holmes Procedure Date: 05/15/2024 9:16 AM MRN: [id number] Account #: [id number] Date of Birth: 1956/05/04 Admit Type: Outpatient Age: 68 Room: [facility] ENDO ROOM 3 Gender: Male Note Status: Finalized Instrument Name: Colon Scope [id number] Procedure:             Colonoscopy Indications:           High risk colon cancer surveillance: Personal history                         of colonic polyps Providers:             Rogelia Copping MD, MD Medicines:             Propofol  per Anesthesia Complications:         No immediate complications. Procedure:             Pre-Anesthesia Assessment:                        - Prior to the procedure, a History and Physical was                         performed, and patient medications and allergies were                         reviewed. The patient's tolerance of previous                         anesthesia was also reviewed. The risks and benefits                         of the procedure and the sedation options and risks                         were discussed with the patient. All questions were                         answered, and informed consent was obtained. Prior                         Anticoagulants: The patient has taken no anticoagulant                         or antiplatelet agents. ASA Grade Assessment: II - A                         patient with mild systemic disease. After reviewing                         the risks and benefits, the patient was deemed in                         satisfactory condition to undergo the procedure.                        After obtaining informed consent, the colonoscope was                         passed under direct vision.  Throughout the procedure,                         the patient's blood pressure, pulse, and oxygen                         saturations were monitored continuously. The                         Colonoscope was introduced through the anus  and                         advanced to the the cecum, identified by appendiceal                         orifice and ileocecal valve. The colonoscopy was                         performed without difficulty. The patient tolerated                         the procedure well. The quality of the bowel                         preparation was excellent. Findings:      The perianal and digital rectal examinations were normal.      A 4 mm polyp was found in the ascending colon. The polyp was sessile.       The polyp was removed with a cold snare. Resection and retrieval were       complete.      A 5 mm polyp was found in the sigmoid colon. The polyp was sessile. The       polyp was removed with a cold snare. Resection and retrieval were       complete.      Non-bleeding internal hemorrhoids were found during retroflexion. The       hemorrhoids were Grade I (internal hemorrhoids that do not prolapse). Impression:            - One 4 mm polyp in the ascending colon, removed with                         a cold snare. Resected and retrieved.                        - One 5 mm polyp in the sigmoid colon, removed with a                         cold snare. Resected and retrieved.                        - Non-bleeding internal hemorrhoids. Recommendation:        - Discharge patient to home.                        - Resume previous diet.                        - Continue present medications.                        -  Repeat colonoscopy in 7 years for surveillance. Procedure Code(s):     --- Professional ---                        [phone], Colonoscopy, flexible; with removal of                         tumor(s), polyp(s), or other lesion(s) by snare                         technique Diagnosis Code(s):     --- Professional ---                        Z86.010, Personal history of colonic polyps                        D12.5, Benign neoplasm of sigmoid colon CPT copyright 2022 American Medical Association. All  rights reserved. The codes documented in this report are preliminary and upon coder review may  be revised to meet current compliance requirements. Rogelia Copping MD, MD 05/15/2024 9:41:57 AM This report has been signed electronically. Number of Addenda: 0 Note Initiated On: 05/15/2024 9:16 AM Scope Withdrawal Time: 0 hours 6 minutes 53 seconds  Total Procedure Duration: 0 hours 9 minutes 12 seconds  Estimated Blood Loss:  Estimated blood loss: none.      [facility]

## 2024-05-15 NOTE — Transfer of Care (Signed)
 Immediate Anesthesia Transfer of Care Note  Patient: Lawrence Holmes  Procedure(s) Performed: COLONOSCOPY POLYPECTOMY, INTESTINE  Patient Location: PACU  Anesthesia Type:General  Level of Consciousness: sedated  Airway & Oxygen Therapy: Patient Spontanous Breathing  Post-op Assessment: Report given to RN and Post -op Vital signs reviewed and stable  Post vital signs: Reviewed and stable  Last Vitals:  Vitals Value Taken Time  BP    Temp    Pulse    Resp    SpO2      Last Pain:  Vitals:   05/15/24 0900  TempSrc: Temporal  PainSc: 0-No pain         Complications: No notable events documented.

## 2024-05-15 NOTE — H&P (Signed)
 Rogelia Copping, MD [facility] [address] [place], [place] [address] Phone:[phone] Fax : [phone]  Primary Care Physician:  Leavy Mole, PA-C Primary Gastroenterologist:  Dr. Copping  Pre-Procedure History & Physical: HPI:  Lawrence Holmes is a 68 y.o. male is here for an colonoscopy.   Past Medical History:  Diagnosis Date   Aortic atherosclerosis    Arthritis    BPH with urinary obstruction    Chronic systolic heart failure (HCC) 07/23/2017   a. TTE 07/23/2017: EF 30-35%, mild LVH, inferolateral and inferior AK, mild BAE, G1DD; b. TTE 10/23/2017: EF 40-45%, mild LVH, basal-mid inferior and inferoseptal AK, mild MR; c. TTE 11/19/2021: EF 45-50%, glob HK with mod to severe inferior and basal to mid septal HK, GLS -15.3%, mild MR, G1DD; d. 07/2023 Echo: EF 40-45%, inf/post HK, GrI DD, nl RV fxn.   Coronary artery disease 07/21/2017   a. 07/2017 Late presenting inferior STEMI/Cath: LM nl, LAD min irregs, LCX 100d (L->L collats), RCA 100p (L->R collats), EF 25-35%-->Med Rx; b. 10/2017 ETT (DOT): Ex time 7:38, baseline antlat TWI, no acute changes; c. 09/2019 ETT: Ex time 6:49. No ECG changes.   DDD (degenerative disc disease), lumbar    a.) s/p MIS L2-3 laminectomy and disectomy   Diverticulosis of colon    Erectile dysfunction    HTN (hypertension)    Hx of bilateral cataract extraction    Hyperlipidemia    Ischemic cardiomyopathy    a. LHC 07/21/2017: EF 25-35%; b. TTE 07/23/2017: 30-35%; c. TTE 10/23/2017: EF 40-45%; d. TTE 11/19/2021: EF 45-50%; e. 07/2023 Echo: EF 40-45%.   Left inguinal hernia    a.) s/p repair 05/19/2022   PVC's (premature ventricular contractions)    a. 06/2023 72 hr Zio: Predominantly sinus rhythm at an average rate of 81 (57-1 33).  1 7 beat run of SVT at 133.  1.3% PAC burden.  14% PVC burden.   ST elevation myocardial infarction (STEMI) of inferior wall (HCC) 07/21/2017   a. LHC 07/21/2017: EF 25-35%, LVEDP 14 mmHg. 40% pLCx, 40% mLCx, 100% dLCx,  100% pRCA --> attempted to cross wire at RCA lesion, however unable. Reasonable RCA collaterals already formed --> med mgmt.   T2DM (type 2 diabetes mellitus) (HCC)    Tobacco use     Past Surgical History:  Procedure Laterality Date   CATARACT EXTRACTION Bilateral    COLONOSCOPY WITH PROPOFOL  N/A 04/04/2019   Procedure: COLONOSCOPY WITH PROPOFOL ;  Surgeon: Janalyn Keene NOVAK, MD;  Location: [facility] END[facility]COPY;  Service: Endoscopy;  Laterality: N/A;   CYSTOSCOPY WITH FULGERATION N/A 05/14/2023   Procedure: CYSTOSCOPY WITH CLOT EVACUATION AND FULGERATION;  Surgeon: Penne Knee, MD;  Location: [facility] ORS;  Service: Urology;  Laterality: N/A;   HOLEP-LASER ENUCLEATION OF THE PROSTATE WITH MORCELLATION N/A 06/08/2023   Procedure: HOLEP-LASER ENUCLEATION OF THE PROSTATE WITH MORCELLATION;  Surgeon: Francisca Redell BROCKS, MD;  Location: [facility] ORS;  Service: Urology;  Laterality: N/A;   LEFT HEART CATH AND CORONARY ANGIOGRAPHY N/A 07/21/2017   Procedure: LEFT HEART CATH AND CORONARY ANGIOGRAPHY;  Surgeon: Darron Deatrice LABOR, MD;  Location: [facility];  Service: Cardiovascular;  Laterality: N/A;   LUMBAR LAMINECTOMY/DECOMPRESSION MICRODISCECTOMY Bilateral 04/05/2023   Procedure: MINIMALLY INVASIVE (MIS) L2-3 LAMINECTOMY AND DISCECTOMY;  Surgeon: Claudene Penne Lin, MD;  Location: [facility] OR[facility];  Service: Neurosurgery;  Laterality: Bilateral;   XI ROBOTIC ASSISTED INGUINAL HERNIA REPAIR WITH MESH Left 05/19/2022   Procedure: XI ROBOTIC ASSISTED INGUINAL HERNIA REPAIR WITH MESH;  Surgeon:  Lane Shope, MD;  Location: [facility] ORS;  Service: General;  Laterality: Left;    Prior to Admission medications   Medication Sig Start Date End Date Taking? Authorizing Provider  aspirin  81 MG chewable tablet Chew 1 tablet (81 mg total) by mouth daily. 07/24/17  Yes Laurence Bridegroom, MD  atorvastatin  (LIPITOR) 80 MG tablet Take 1 tablet (80 mg total) by mouth at bedtime. 06/05/23  Yes Vivienne Lonni Ingle, NP   carvedilol  (COREG ) 6.25 MG tablet Take 1 tablet (6.25 mg total) by mouth 2 (two) times daily. 09/28/23  Yes Kennyth Chew, MD  JARDIANCE  10 MG TABS tablet TAKE 1 TABLET(10 MG) BY MOUTH DAILY BEFORE BREAKFAST 04/02/24  Yes Tapia, Leisa, PA-C  metFORMIN  (GLUCOPHAGE ) 500 MG tablet Take 1 tablet (500 mg total) by mouth 2 (two) times daily with a meal. 05/09/23  Yes Tapia, Leisa, PA-C  mexiletine (MEXITIL) 150 MG capsule TAKE 1 CAPSULE(150 MG) BY MOUTH TWICE DAILY 01/08/24  Yes Kennyth Chew, MD  sacubitril -valsartan  (ENTRESTO ) 24-26 MG TAKE 1 TABLET BY MOUTH TWICE DAILY 02/12/24  Yes Darron Deatrice LABOR, MD  spironolactone  (ALDACTONE ) 25 MG tablet TAKE 1 TABLET(25 MG) BY MOUTH DAILY 09/17/23  Yes Darron Deatrice LABOR, MD  ACCU-CHEK GUIDE test strip SMARTSIG:Via Meter 1-4 Times Daily 11/25/21   [provider]  Accu-Chek Softclix Lancets lancets SMARTSIG:Via Meter 1-4 Times Daily 11/25/21   [provider]  Blood Gluc Meter Disp-Strips (BLOOD GLUCOSE METER DISPOSABLE) DEVI Check fingerstick blood sugars once a day; E11.69, LON 99 months 05/13/18   Hinton Newell SQUIBB, MD  blood glucose meter kit and supplies KIT Dispense based on patient and insurance preference. Use up to four times daily as directed. (FOR ICD-9 250.00, 250.01). 11/23/21   Tapia, Leisa, PA-C  varenicline  (CHANTIX  CONTINUING MONTH PAK) 1 MG tablet Take 1 tablet (1 mg total) by mouth 2 (two) times daily. 09/01/23   Vivienne Lonni Ingle, NP    Allergies as of 02/19/2024   (No Known Allergies)    Family History  Problem Relation Age of Onset   Heart disease Mother    Hyperlipidemia Mother    Hypertension Mother    Heart attack Sister     Social History   Socioeconomic History   Marital status: Married    Spouse name: Bradlee, Heitman   Number of children: 1   Years of education: Not on file   Highest education level: 12th grade  Occupational History   Not on file  Tobacco Use   Smoking status: Every Day    Current  packs/day: 1.00    Average packs/day: 1 pack/day for 49.7 years (49.7 ttl pk-yrs)    Types: Cigarettes    Start date: 08/21/1974    Passive exposure: Current   Smokeless tobacco: Never   Tobacco comments:    Started smoking around 68 y/o has stopped several times  Vaping Use   Vaping status: Never Used  Substance and Sexual Activity   Alcohol use: Yes    Comment: rare   Drug use: No   Sexual activity: Yes  Other Topics Concern   Not on file  Social History Narrative   Not on file   Social Drivers of Health   Financial Resource Strain: Low Risk  (02/11/2024)   Overall Financial Resource Strain (CARDIA)    Difficulty of Paying Living Expenses: Not hard at all  Food Insecurity: No Food Insecurity (02/11/2024)   Hunger Vital Sign    Worried About Running Out of Food in the Last Year:  Never true    Ran Out of Food in the Last Year: Never true  Transportation Needs: No Transportation Needs (02/11/2024)   PRAPARE - Administrator, Civil Service (Medical): No    Lack of Transportation (Non-Medical): No  Physical Activity: Insufficiently Active (02/11/2024)   Exercise Vital Sign    Days of Exercise per Week: 4 days    Minutes of Exercise per Session: 20 min  Stress: No Stress Concern Present (02/11/2024)   [employer] of Occupational Health - Occupational Stress Questionnaire    Feeling of Stress: Not at all  Social Connections: Moderately Integrated (02/11/2024)   Social Connection and Isolation Panel    Frequency of Communication with Friends and Family: More than three times a week    Frequency of Social Gatherings with Friends and Family: Three times a week    Attends Religious Services: 1 to 4 times per year    Active Member of Clubs or Organizations: Yes    Attends Banker Meetings: 1 to 4 times per year    Marital Status: Separated  Intimate Partner Violence: Not At Risk (05/12/2023)   Humiliation, Afraid, Rape, and Kick questionnaire    Fear of  Current or Ex-Partner: No    Emotionally Abused: No    Physically Abused: No    Sexually Abused: No    Review of Systems: See HPI, otherwise negative ROS  Physical Exam: BP 133/67   Pulse (!) 57   Temp (!) 96.4 F (35.8 C) (Temporal)   Ht 6' 0.99 (1.854 m)   Wt 95.3 kg   SpO2 99%   BMI 27.71 kg/m  General:   Alert,  pleasant and cooperative in NAD Head:  Normocephalic and atraumatic. Neck:  Supple; no masses or thyromegaly. Lungs:  Clear throughout to auscultation.    Heart:  Regular rate and rhythm. Abdomen:  Soft, nontender and nondistended. Normal bowel sounds, without guarding, and without rebound.   Neurologic:  Alert and  oriented x4;  grossly normal neurologically.  Impression/Plan: Lawrence Holmes is here for an colonoscopy to be performed for a history of adenomatous polyps on 2020   Risks, benefits, limitations, and alternatives regarding  colonoscopy have been reviewed with the patient.  Questions have been answered.  All parties agreeable.   Rogelia Copping, MD  05/15/2024, 9:16 AM

## 2024-05-15 NOTE — Anesthesia Preprocedure Evaluation (Signed)
 Anesthesia Evaluation  Patient identified by MRN, date of birth, ID band Patient awake    Reviewed: Allergy & Precautions, NPO status , Patient's Chart, lab work & pertinent test results  History of Anesthesia Complications Negative for: history of anesthetic complications  Airway Mallampati: III  TM Distance: <3 FB Neck ROM: full    Dental  (+) Upper Dentures, Lower Dentures   Pulmonary neg shortness of breath, COPD, Current Smoker   Pulmonary exam normal        Cardiovascular Exercise Tolerance: Good hypertension, (-) angina + CAD, + Past MI and +CHF  Normal cardiovascular exam     Neuro/Psych negative neurological ROS  negative psych ROS   GI/Hepatic negative GI ROS, Neg liver ROS,neg GERD  ,,  Endo/Other  diabetes, Type 2    Renal/GU Renal disease  negative genitourinary   Musculoskeletal   Abdominal   Peds  Hematology negative hematology ROS (+)   Anesthesia Other Findings Past Medical History: No date: Aortic atherosclerosis No date: Arthritis No date: BPH with urinary obstruction 07/23/2017: Chronic systolic heart failure (HCC)     Comment:  a. TTE 07/23/2017: EF 30-35%, mild LVH, inferolateral               and inferior AK, mild BAE, G1DD; b. TTE 10/23/2017: EF               40-45%, mild LVH, basal-mid inferior and inferoseptal AK,              mild MR; c. TTE 11/19/2021: EF 45-50%, glob HK with mod               to severe inferior and basal to mid septal HK, GLS               -15.3%, mild MR, G1DD; d. 07/2023 Echo: EF 40-45%,               inf/post HK, GrI DD, nl RV fxn. 07/21/2017: Coronary artery disease     Comment:  a. 07/2017 Late presenting inferior STEMI/Cath: LM nl,               LAD min irregs, LCX 100d (L->L collats), RCA 100p (L->R               collats), EF 25-35%-->Med Rx; b. 10/2017 ETT (DOT): Ex               time 7:38, baseline antlat TWI, no acute changes; c.               09/2019  ETT: Ex time 6:49. No ECG changes. No date: DDD (degenerative disc disease), lumbar     Comment:  a.) s/p MIS L2-3 laminectomy and disectomy No date: Diverticulosis of colon No date: Erectile dysfunction No date: HTN (hypertension) No date: Hx of bilateral cataract extraction No date: Hyperlipidemia No date: Ischemic cardiomyopathy     Comment:  a. LHC 07/21/2017: EF 25-35%; b. TTE 07/23/2017: 30-35%;              c. TTE 10/23/2017: EF 40-45%; d. TTE 11/19/2021: EF               45-50%; e. 07/2023 Echo: EF 40-45%. No date: Left inguinal hernia     Comment:  a.) s/p repair 05/19/2022 No date: PVC's (premature ventricular contractions)     Comment:  a. 06/2023 72 hr Zio: Predominantly sinus rhythm at an  average rate of 81 (57-1 33).  1 7 beat run of SVT at               133.  1.3% PAC burden.  14% PVC burden. 07/21/2017: ST elevation myocardial infarction (STEMI) of inferior  wall (HCC)     Comment:  a. LHC 07/21/2017: EF 25-35%, LVEDP 14 mmHg. 40% pLCx,               40% mLCx, 100% dLCx, 100% pRCA --> attempted to cross               wire at RCA lesion, however unable. Reasonable RCA               collaterals already formed --> med mgmt. No date: T2DM (type 2 diabetes mellitus) (HCC) No date: Tobacco use  Past Surgical History: No date: CATARACT EXTRACTION; Bilateral 04/04/2019: COLONOSCOPY WITH PROPOFOL ; N/A     Comment:  Procedure: COLONOSCOPY WITH PROPOFOL ;  Surgeon:               Janalyn Keene NOVAK, MD;  Location: [facility];                Service: Endoscopy;  Laterality: N/A; 05/14/2023: CYSTOSCOPY WITH FULGERATION; N/A     Comment:  Procedure: CYSTOSCOPY WITH CLOT EVACUATION AND               FULGERATION;  Surgeon: Penne Knee, MD;  Location:               [facility] ORS;  Service: Urology;  Laterality: N/A; 06/08/2023: HOLEP-LASER ENUCLEATION OF THE PROSTATE WITH  MORCELLATION; N/A     Comment:  Procedure: HOLEP-LASER ENUCLEATION OF THE PROSTATE WITH                MORCELLATION;  Surgeon: Francisca Redell BROCKS, MD;  Location:               [facility] ORS;  Service: Urology;  Laterality: N/A; 07/21/2017: LEFT HEART CATH AND CORONARY ANGIOGRAPHY; N/A     Comment:  Procedure: LEFT HEART CATH AND CORONARY ANGIOGRAPHY;                Surgeon: Darron Deatrice LABOR, MD;  Location: [facility] INVASIVE               CV LAB;  Service: Cardiovascular;  Laterality: N/A; 04/05/2023: LUMBAR LAMINECTOMY/DECOMPRESSION MICRODISCECTOMY; Bilateral     Comment:  Procedure: MINIMALLY INVASIVE (MIS) L2-3 LAMINECTOMY AND              DISCECTOMY;  Surgeon: Claudene Penne Lin, MD;                Location: [facility] ORS;  Service: Neurosurgery;  Laterality:               Bilateral; 05/19/2022: XI ROBOTIC ASSISTED INGUINAL HERNIA REPAIR WITH MESH; Left     Comment:  Procedure: XI ROBOTIC ASSISTED INGUINAL HERNIA REPAIR               WITH MESH;  Surgeon: Lane Shope, MD;  Location:               [facility] ORS;  Service: General;  Laterality: Left;     Reproductive/Obstetrics negative OB ROS                              Anesthesia Physical Anesthesia Plan  ASA: 3  Anesthesia Plan: General  Post-op Pain Management:    Induction: Intravenous  PONV Risk Score and Plan: Propofol  infusion and TIVA  Airway Management Planned: Natural Airway and Nasal Cannula  Additional Equipment:   Intra-op Plan:   Post-operative Plan:   Informed Consent: I have reviewed the patients History and Physical, chart, labs and discussed the procedure including the risks, benefits and alternatives for the proposed anesthesia with the patient or authorized representative who has indicated his/her understanding and acceptance.     Dental Advisory Given  Plan Discussed with: Anesthesiologist, CRNA and Surgeon  Anesthesia Plan Comments: (Patient consented for risks of anesthesia including but not limited to:  - adverse reactions to medications - risk of airway placement if  required - damage to eyes, teeth, lips or other oral mucosa - nerve damage due to positioning  - sore throat or hoarseness - Damage to heart, brain, nerves, lungs, other parts of body or loss of life  Patient voiced understanding and assent.)        Anesthesia Quick Evaluation

## 2024-05-15 NOTE — Anesthesia Postprocedure Evaluation (Signed)
 Anesthesia Post Note  Patient: Wadell D [place]  Procedure(s) Performed: COLONOSCOPY POLYPECTOMY, INTESTINE  Patient location during evaluation: Endoscopy Anesthesia Type: General Level of consciousness: awake and alert Pain management: pain level controlled Vital Signs Assessment: post-procedure vital signs reviewed and stable Respiratory status: spontaneous breathing, nonlabored ventilation and respiratory function stable Cardiovascular status: blood pressure returned to baseline and stable Postop Assessment: no apparent nausea or vomiting Anesthetic complications: no   No notable events documented.   Last Vitals:  Vitals:   05/15/24 1006 05/15/24 1010  BP: 99/63 105/62  Pulse:    Resp: 17 15  Temp:    SpO2:      Last Pain:  Vitals:   05/15/24 0943  TempSrc: Temporal  PainSc:                  Fairy POUR Magaby Rumberger

## 2024-05-19 LAB — SURGICAL PATHOLOGY

## 2024-05-20 ENCOUNTER — Ambulatory Visit: Payer: Self-pay | Admitting: Gastroenterology

## 2024-05-27 ENCOUNTER — Telehealth: Payer: Self-pay | Admitting: Cardiovascular Disease

## 2024-05-27 NOTE — Telephone Encounter (Signed)
*  STAT* If patient is at the pharmacy, call can be transferred to refill team.   1. Which medications need to be refilled? (please list name of each medication and dose if known)   sacubitril -valsartan  (ENTRESTO ) 24-26 MG   2. Would you like to learn more about the convenience, safety, & potential cost savings by using the [facility]?   3. Are you open to using the [facility] (Type [facility]. ).  4. Which pharmacy/location (including street and city if local pharmacy) is medication to be sent to?  [id number] - [place], [place] - [address] OF [address]   5. Do they need a 30 day or 90 day supply?   90 day  Patient stated he will be out of this medication in a couple of days.

## 2024-05-28 ENCOUNTER — Other Ambulatory Visit (HOSPITAL_COMMUNITY): Payer: Self-pay

## 2024-05-28 NOTE — Telephone Encounter (Signed)
 Plan will only pay for brand name Entresto  if he can not tolerate the generic. He will have to take the generic version

## 2024-05-28 NOTE — Telephone Encounter (Signed)
 I do not really know how to fix that situation.  He is either can I have to pay the $307 or we switch him to valsartan  160 mg once daily.

## 2024-05-28 NOTE — Telephone Encounter (Signed)
 Patient states he has a couple days of Entresto  left.  He states [facility] wants to fill the generic version of Entresto , but 90 day supply would cost $307. He reports he spoke to his insurance and requires prior auth to fill Entresto .   Will forward to Rx Prior Auth and Rx Medication Assistance.

## 2024-05-28 NOTE — Telephone Encounter (Signed)
 Per test claim, medication is not covered due to plan/benefit exclusion, PA not submitted at this time  *Brand Entresto  will not be covered with a PA unless patient has clinical contradiction to the generic.     Routing to PharmD pool to advise.

## 2024-05-29 MED ORDER — VALSARTAN 160 MG PO TABS: 160.0000 mg | ORAL_TABLET | Freq: Every day | ORAL | 1 refills | Status: AC

## 2024-05-29 NOTE — Telephone Encounter (Signed)
 Patient has been made aware to start the Valsartan  160 mg once daily. This has been sent in for him.

## 2024-06-16 ENCOUNTER — Other Ambulatory Visit: Payer: Self-pay | Admitting: Nurse Practitioner

## 2024-07-28 ENCOUNTER — Telehealth: Payer: Self-pay | Admitting: Family Medicine

## 2024-07-28 DIAGNOSIS — E119 Type 2 diabetes mellitus without complications: Secondary | ICD-10-CM

## 2024-07-28 MED ORDER — EMPAGLIFLOZIN 10 MG PO TABS: 10.0000 mg | ORAL_TABLET | Freq: Every day | ORAL | 0 refills | Status: DC

## 2024-07-28 NOTE — Telephone Encounter (Signed)
 30 day  given needs follow-up appt scheduled

## 2024-07-28 NOTE — Telephone Encounter (Signed)
 JARDIANCE  10 MG TABS tablet

## 2024-08-04 ENCOUNTER — Other Ambulatory Visit: Payer: Self-pay

## 2024-08-04 ENCOUNTER — Telehealth: Payer: Self-pay

## 2024-08-04 MED ORDER — METFORMIN HCL 500 MG PO TABS: 500.0000 mg | ORAL_TABLET | Freq: Two times a day (BID) | ORAL | 0 refills | Status: DC

## 2024-08-04 NOTE — Telephone Encounter (Signed)
 Refill request on metFORMIN  (GLUCOPHAGE ) 500 MG tablet

## 2024-08-18 ENCOUNTER — Ambulatory Visit: Admitting: Family Medicine

## 2024-08-18 ENCOUNTER — Encounter: Payer: Self-pay | Admitting: Family Medicine

## 2024-08-18 VITALS — BP 126/70 | HR 67 | Temp 97.9°F | Resp 18 | Ht 72.0 in | Wt 212.4 lb

## 2024-08-18 DIAGNOSIS — E1169 Type 2 diabetes mellitus with other specified complication: Secondary | ICD-10-CM

## 2024-08-18 DIAGNOSIS — Z720 Tobacco use: Secondary | ICD-10-CM | POA: Diagnosis not present

## 2024-08-18 DIAGNOSIS — I509 Heart failure, unspecified: Secondary | ICD-10-CM

## 2024-08-18 DIAGNOSIS — Z7984 Long term (current) use of oral hypoglycemic drugs: Secondary | ICD-10-CM

## 2024-08-18 DIAGNOSIS — I10 Essential (primary) hypertension: Secondary | ICD-10-CM

## 2024-08-18 DIAGNOSIS — Z122 Encounter for screening for malignant neoplasm of respiratory organs: Secondary | ICD-10-CM

## 2024-08-18 DIAGNOSIS — I25118 Atherosclerotic heart disease of native coronary artery with other forms of angina pectoris: Secondary | ICD-10-CM

## 2024-08-18 DIAGNOSIS — E785 Hyperlipidemia, unspecified: Secondary | ICD-10-CM | POA: Diagnosis not present

## 2024-08-18 MED ORDER — METFORMIN HCL 500 MG PO TABS: 500.0000 mg | ORAL_TABLET | Freq: Two times a day (BID) | ORAL | 0 refills | Status: AC

## 2024-08-18 MED ORDER — EMPAGLIFLOZIN 10 MG PO TABS: 10.0000 mg | ORAL_TABLET | Freq: Every day | ORAL | 0 refills | Status: AC

## 2024-08-18 NOTE — Assessment & Plan Note (Addendum)
 CAD with hx of STEMI in 2018. Followed by cardiology, last visit in 11/2023. Denies chest pain, palpitations, or shortness of breath at time of visit. Advised to continue following with cardiology and continue medication regimen as directed by cardiology. He voices understanding and is agreeable. Cardiology managing medications including: Mexiletine 150mg  BID, Spironolactone  25mg  daily, Valsartan  160mg  daily, Carvedilol  6.25mg  BID, Atorvastatin  80mg  daily Orders:   CBC w/Diff/Platelet   Magnesium

## 2024-08-18 NOTE — Assessment & Plan Note (Addendum)
 Follows with cardiology. Last visit in 11/2023. Advised to continue with scheduled cardiology follow up for chronic condition management and medication management. He voices understanding and is agreeable. Cardiology managing medications including: Mexiletine 150mg  BID, Spironolactone  25mg  daily, Valsartan  160mg  daily, Carvedilol  6.25mg  BID, Atorvastatin  80mg  daily Orders:   empagliflozin  (JARDIANCE ) 10 MG TABS tablet; Take 1 tablet (10 mg total) by mouth daily.   Comprehensive Metabolic Panel (CMET)   Magnesium

## 2024-08-18 NOTE — Assessment & Plan Note (Signed)
 HTN stable; HTN controlled. BP at goal. BP 126/70 at time of visit today.   -Advised to continue with cardiology follow up and maintain scheduled appointments.

## 2024-08-18 NOTE — Assessment & Plan Note (Addendum)
 Chronic long term tobacco use. He voices he smokes 1 PPD but often does not smoke the whole cigarette, voicing he throws a pack away. He voices he has interest in quitting. Previously prescribed Varenicline  but admits he did not take the medication due to fear of listed side effects.   -Encouraged tobacco cessation. Will plan to discuss in more detail at 3 month f/u

## 2024-08-18 NOTE — Assessment & Plan Note (Addendum)
 Dyslipidemia managed with stain therapy, Atorvastatin  80mg  daily. Dyslipidemia controlled with last LDL of 68 07/2023. Orders:   Comprehensive Metabolic Panel (CMET)

## 2024-08-18 NOTE — Progress Notes (Signed)
 "  Established Patient Office Visit  Subjective   Patient ID: Lawrence Holmes, male    DOB: Oct 15, 1955  Age: 68 y.o. MRN: [id number]  Chief Complaint  Patient presents with   Medical Management of Chronic Issues   Medication Refill    HPI Lawrence Holmes is a pleasant 68 year old male who presents to the office today for chronic condition follow up and medication refills. He is a new patient to me, however well established in office. Last visit with previous PCP in 01/2024. He follows with cardiology for management of CAD, hx STEMI in 07/2017, chronic CHF, HTN and dyslipidemia. He also voices he follows with urology and has an upcoming visit scheduled.   He denies new concerns or complaints. He voices he still experiences weakness in his legs after having spine surgery in 03/2023. He voices symptoms have not worsened. He denies cramping of the lower extremities with exertion. He denies falls. He voices he knows his physical activity limits and after longer distances requires rest. He reports he did complete physical therapy after surgery and this was helpful allowing him to regain strength and no longer requiring use of a walker or a cane. Offered referral to physical therapy, however he declines. Discussed returning to spine specialist, however not interested at this time. Advised if symptoms continue, worsen or new symptoms present that he should return to the spine specialist for evaluation. He is receptive. He does ask that he is given a new handicap placard because his last one given to him by his PCP has expired. Handicap placard provided.    Review of Systems  Respiratory:  Negative for cough and shortness of breath.   Cardiovascular:  Negative for chest pain, palpitations and leg swelling.  Musculoskeletal:  Negative for falls.  Neurological:  Negative for dizziness, loss of consciousness and headaches.  Psychiatric/Behavioral:  The patient does not have insomnia.       Objective:     BP  126/70   Pulse 67   Temp 97.9 F (36.6 C)   Resp 18   Ht 6' (1.829 m)   Wt 212 lb 6.4 oz (96.3 kg)   SpO2 98%   BMI 28.81 kg/m  BP Readings from Last 3 Encounters:  08/18/24 126/70  05/15/24 105/62  02/15/24 136/70   Wt Readings from Last 3 Encounters:  08/18/24 212 lb 6.4 oz (96.3 kg)  05/15/24 210 lb (95.3 kg)  02/15/24 218 lb (98.9 kg)      Physical Exam Constitutional:      General: He is not in acute distress.    Appearance: Normal appearance.  HENT:     Head: Normocephalic and atraumatic.  Cardiovascular:     Rate and Rhythm: Normal rate and regular rhythm.     Pulses:          Dorsalis pedis pulses are 2+ on the right side and 2+ on the left side.     Heart sounds: Normal heart sounds.  Pulmonary:     Effort: Pulmonary effort is normal.     Breath sounds: Normal breath sounds.  Musculoskeletal:     Right lower leg: No edema.     Left lower leg: No edema.     Right foot: Normal range of motion. No deformity.     Left foot: Normal range of motion. No deformity.  Feet:     Right foot:     Protective Sensation: 10 sites tested.  10 sites sensed.  Skin integrity: Callus present. No blister, skin breakdown or erythema.     Left foot:     Protective Sensation: 10 sites tested.  10 sites sensed.     Skin integrity: Callus present. No blister, skin breakdown or erythema.  Skin:    General: Skin is warm and dry.  Neurological:     General: No focal deficit present.     Mental Status: He is alert and oriented to person, place, and time.  Psychiatric:        Mood and Affect: Mood normal.        Behavior: Behavior normal.    Last CBC Lab Results  Component Value Date   WBC 8.9 07/24/2023   HGB 14.8 07/24/2023   HCT 45.0 07/24/2023   MCV 90.0 07/24/2023   MCH 29.6 07/24/2023   RDW 13.4 07/24/2023   PLT 284 07/24/2023   Last metabolic panel Lab Results  Component Value Date   GLUCOSE 109 (H) 02/15/2024   NA 139 02/15/2024   K 4.6 02/15/2024   CL  106 02/15/2024   CO2 26 02/15/2024   BUN 11 02/15/2024   CREATININE 1.05 02/15/2024   EGFR 77 02/15/2024   CALCIUM  9.6 02/15/2024   PHOS 2.5 07/21/2017   PROT 6.8 02/15/2024   ALBUMIN 4.1 05/07/2022   BILITOT 0.5 02/15/2024   ALKPHOS 57 05/07/2022   AST 20 02/15/2024   ALT 33 02/15/2024   ANIONGAP 9 05/15/2023   Last lipids Lab Results  Component Value Date   CHOL 113 07/24/2023   HDL 25 (L) 07/24/2023   LDLCALC 68 07/24/2023   TRIG 122 07/24/2023   CHOLHDL 4.5 07/24/2023   Last hemoglobin A1c Lab Results  Component Value Date   HGBA1C 7.1 (H) 02/15/2024      Assessment & Plan:   Assessment & Plan Coronary artery disease of native artery of native heart with stable angina pectoris CAD with hx of STEMI in 2018. Followed by cardiology, last visit in 11/2023. Denies chest pain, palpitations, or shortness of breath at time of visit. Advised to continue following with cardiology and continue medication regimen as directed by cardiology. He voices understanding and is agreeable. Cardiology managing medications including: Mexiletine 150mg  BID, Spironolactone  25mg  daily, Valsartan  160mg  daily, Carvedilol  6.25mg  BID, Atorvastatin  80mg  daily Orders:   CBC w/Diff/Platelet   Magnesium  Chronic congestive heart failure, unspecified heart failure type [facility]) Follows with cardiology. Last visit in 11/2023. Advised to continue with scheduled cardiology follow up for chronic condition management and medication management. He voices understanding and is agreeable. Cardiology managing medications including: Mexiletine 150mg  BID, Spironolactone  25mg  daily, Valsartan  160mg  daily, Carvedilol  6.25mg  BID, Atorvastatin  80mg  daily Orders:   empagliflozin  (JARDIANCE ) 10 MG TABS tablet; Take 1 tablet (10 mg total) by mouth daily.   Comprehensive Metabolic Panel (CMET)   Magnesium  Type 2 diabetes mellitus with other specified complication, without long-term current use of insulin  (HCC) Type 2 DM  with other chronic conditions including dyslipidemia, hypertension, CHF and CAD. Diabetic foot exam performed during visit and notable findings include calluses affecting bilateral 5th metatarsal head, otherwise skin integrity intact. Bilateral great toenails thickened with yellow discoloration. He denies associated pain or other symptoms. Sensation intact. Recommended podiatry referral, declined at this time.  Type 2 DM uncontrolled with last A1c at 7.1 in 01/2024. He admits diet has been poor, worse over recent Thanksgiving and Christmas holidays.  Overdue for diabetic eye exam. Follows with [facility].   -Advised patient  to schedule eye exam  -Reinforced importance of diabetic friendly diet in addition to medication adherence -Update labs -Continue Jardiance  10mg  once daily and Metformin  500mg  BID. Refills provided  Orders:   Hemoglobin A1C   HM Diabetes Foot Exam   empagliflozin  (JARDIANCE ) 10 MG TABS tablet; Take 1 tablet (10 mg total) by mouth daily.   metFORMIN  (GLUCOPHAGE ) 500 MG tablet; Take 1 tablet (500 mg total) by mouth 2 (two) times daily with a meal.   Comprehensive Metabolic Panel (CMET)  Screening for lung cancer Chronic long term tobacco use. Last CT chest screening completed in 07/2023. 07/2023 lung cancer screening noted no suspicious pulmonary nodules on findings. Impression ntoes findings consistent with emphysema.   -Update lung cancer screening, chest CT ordered -Encouraged tobacco cessation -3 month f/u  Orders:   CT CHEST LUNG CA SCREEN LOW DOSE W/O CM; Future  Dyslipidemia Dyslipidemia managed with stain therapy, Atorvastatin  80mg  daily. Dyslipidemia controlled with last LDL of 68 07/2023. Orders:   Comprehensive Metabolic Panel (CMET)  Primary hypertension HTN stable; HTN controlled. BP at goal. BP 126/70 at time of visit today.   -Advised to continue with cardiology follow up and maintain scheduled appointments.     Tobacco  use Chronic long term tobacco use. He voices he smokes 1 PPD but often does not smoke the whole cigarette, voicing he throws a pack away. He voices he has interest in quitting. Previously prescribed Varenicline  but admits he did not take the medication due to fear of listed side effects.   -Encouraged tobacco cessation. Will plan to discuss in more detail at 3 month f/u      Return in about 3 months (around 11/16/2024).    LAYMON LOISE CORE, FNP "

## 2024-08-19 ENCOUNTER — Ambulatory Visit: Payer: Self-pay | Admitting: Family Medicine

## 2024-08-19 DIAGNOSIS — D72820 Lymphocytosis (symptomatic): Secondary | ICD-10-CM

## 2024-08-19 LAB — CBC WITH DIFFERENTIAL/PLATELET
Absolute Lymphocytes: 4282 {cells}/uL — ABNORMAL HIGH (ref 850–3900)
Absolute Monocytes: 475 {cells}/uL (ref 200–950)
Basophils Absolute: 61 {cells}/uL (ref 0–200)
Basophils Relative: 0.6 %
Eosinophils Absolute: 717 {cells}/uL — ABNORMAL HIGH (ref 15–500)
Eosinophils Relative: 7.1 %
HCT: 46.1 % (ref 39.4–51.1)
Hemoglobin: 15.5 g/dL (ref 13.2–17.1)
MCH: 30.4 pg (ref 27.0–33.0)
MCHC: 33.6 g/dL (ref 31.6–35.4)
MCV: 90.4 fL (ref 81.4–101.7)
MPV: 12.1 fL (ref 7.5–12.5)
Monocytes Relative: 4.7 %
Neutro Abs: 4565 {cells}/uL (ref 1500–7800)
Neutrophils Relative %: 45.2 %
Platelets: 315 Thousand/uL (ref 140–400)
RBC: 5.1 Million/uL (ref 4.20–5.80)
RDW: 13.4 % (ref 11.0–15.0)
Total Lymphocyte: 42.4 %
WBC: 10.1 Thousand/uL (ref 3.8–10.8)

## 2024-08-19 LAB — COMPREHENSIVE METABOLIC PANEL WITH GFR
AG Ratio: 1.4 (calc) (ref 1.0–2.5)
ALT: 38 U/L (ref 9–46)
AST: 22 U/L (ref 10–35)
Albumin: 4.2 g/dL (ref 3.6–5.1)
Alkaline phosphatase (APISO): 70 U/L (ref 35–144)
BUN: 17 mg/dL (ref 7–25)
CO2: 26 mmol/L (ref 20–32)
Calcium: 9.8 mg/dL (ref 8.6–10.3)
Chloride: 107 mmol/L (ref 98–110)
Creat: 1.03 mg/dL (ref 0.70–1.35)
Globulin: 3.1 g/dL (ref 1.9–3.7)
Glucose, Bld: 75 mg/dL (ref 65–139)
Potassium: 4.4 mmol/L (ref 3.5–5.3)
Sodium: 142 mmol/L (ref 135–146)
Total Bilirubin: 0.4 mg/dL (ref 0.2–1.2)
Total Protein: 7.3 g/dL (ref 6.1–8.1)
eGFR: 79 mL/min/1.73m2

## 2024-08-19 LAB — HEMOGLOBIN A1C
Hgb A1c MFr Bld: 7.3 % — ABNORMAL HIGH
Mean Plasma Glucose: 163 mg/dL
eAG (mmol/L): 9 mmol/L

## 2024-08-19 LAB — MAGNESIUM: Magnesium: 2.1 mg/dL (ref 1.5–2.5)

## 2024-08-22 ENCOUNTER — Encounter: Admitting: Family Medicine

## 2024-09-11 ENCOUNTER — Other Ambulatory Visit: Payer: Self-pay | Admitting: Cardiovascular Disease

## 2024-10-02 ENCOUNTER — Ambulatory Visit

## 2024-10-22 ENCOUNTER — Ambulatory Visit: Admitting: Urology

## 2024-10-23 ENCOUNTER — Ambulatory Visit: Payer: 59 | Admitting: Urology

## 2024-11-17 ENCOUNTER — Ambulatory Visit: Admitting: Family Medicine
